# Patient Record
Sex: Female | Born: 1940 | Race: White | Hispanic: No | Smoking: Never smoker
Health system: Southern US, Community
[De-identification: ages and names within clinical notes are randomized; demographics above are authoritative.]

## PROBLEM LIST (undated history)

## (undated) DIAGNOSIS — I441 Atrioventricular block, second degree: Secondary | ICD-10-CM

## (undated) DIAGNOSIS — R002 Palpitations: Secondary | ICD-10-CM

## (undated) DIAGNOSIS — E7800 Pure hypercholesterolemia, unspecified: Secondary | ICD-10-CM

## (undated) DIAGNOSIS — K219 Gastro-esophageal reflux disease without esophagitis: Secondary | ICD-10-CM

## (undated) DIAGNOSIS — E039 Hypothyroidism, unspecified: Secondary | ICD-10-CM

## (undated) DIAGNOSIS — E669 Obesity, unspecified: Secondary | ICD-10-CM

## (undated) DIAGNOSIS — I4891 Unspecified atrial fibrillation: Secondary | ICD-10-CM

## (undated) DIAGNOSIS — D509 Iron deficiency anemia, unspecified: Secondary | ICD-10-CM

## (undated) DIAGNOSIS — D126 Benign neoplasm of colon, unspecified: Secondary | ICD-10-CM

## (undated) DIAGNOSIS — K3189 Other diseases of stomach and duodenum: Secondary | ICD-10-CM

## (undated) DIAGNOSIS — D472 Monoclonal gammopathy: Secondary | ICD-10-CM

## (undated) DIAGNOSIS — I10 Essential (primary) hypertension: Secondary | ICD-10-CM

## (undated) DIAGNOSIS — I340 Nonrheumatic mitral (valve) insufficiency: Secondary | ICD-10-CM

## (undated) HISTORY — DX: Obesity, unspecified: E66.9

## (undated) HISTORY — DX: Palpitations: R00.2

## (undated) HISTORY — DX: Pure hypercholesterolemia, unspecified: E78.00

## (undated) HISTORY — PX: OVARY SURGERY: SHX727

## (undated) HISTORY — DX: Hypothyroidism, unspecified: E03.9

## (undated) HISTORY — DX: Unspecified atrial fibrillation: I48.91

## (undated) HISTORY — DX: Essential (primary) hypertension: I10

## (undated) HISTORY — DX: Gastro-esophageal reflux disease without esophagitis: K21.9

## (undated) HISTORY — DX: Iron deficiency anemia, unspecified: D50.9

## (undated) HISTORY — DX: Monoclonal gammopathy: D47.2

## (undated) HISTORY — DX: Nonrheumatic mitral (valve) insufficiency: I34.0

## (undated) HISTORY — DX: Atrioventricular block, second degree: I44.1

## (undated) HISTORY — PX: ABDOMINAL HYSTERECTOMY: SHX81

## (undated) HISTORY — DX: Other diseases of stomach and duodenum: K31.89

## (undated) HISTORY — DX: Benign neoplasm of colon, unspecified: D12.6

---

## 1998-06-09 ENCOUNTER — Other Ambulatory Visit: Admission: RE | Admit: 1998-06-09 | Discharge: 1998-06-09 | Payer: Self-pay | Admitting: *Deleted

## 1999-07-23 ENCOUNTER — Other Ambulatory Visit: Admission: RE | Admit: 1999-07-23 | Discharge: 1999-07-23 | Payer: Self-pay | Admitting: *Deleted

## 2000-08-28 ENCOUNTER — Other Ambulatory Visit: Admission: RE | Admit: 2000-08-28 | Discharge: 2000-08-28 | Payer: Self-pay | Admitting: *Deleted

## 2001-02-07 ENCOUNTER — Encounter (INDEPENDENT_AMBULATORY_CARE_PROVIDER_SITE_OTHER): Payer: Self-pay | Admitting: *Deleted

## 2001-02-07 ENCOUNTER — Ambulatory Visit (HOSPITAL_COMMUNITY): Admission: RE | Admit: 2001-02-07 | Discharge: 2001-02-07 | Payer: Self-pay | Admitting: Gastroenterology

## 2010-04-16 ENCOUNTER — Ambulatory Visit: Payer: Self-pay | Admitting: Cardiology

## 2010-09-14 ENCOUNTER — Encounter: Payer: Self-pay | Admitting: Cardiology

## 2010-09-14 DIAGNOSIS — I421 Obstructive hypertrophic cardiomyopathy: Secondary | ICD-10-CM | POA: Insufficient documentation

## 2010-09-14 DIAGNOSIS — I340 Nonrheumatic mitral (valve) insufficiency: Secondary | ICD-10-CM | POA: Insufficient documentation

## 2010-09-14 DIAGNOSIS — E039 Hypothyroidism, unspecified: Secondary | ICD-10-CM | POA: Insufficient documentation

## 2010-09-14 DIAGNOSIS — D126 Benign neoplasm of colon, unspecified: Secondary | ICD-10-CM | POA: Insufficient documentation

## 2010-09-14 DIAGNOSIS — R002 Palpitations: Secondary | ICD-10-CM | POA: Insufficient documentation

## 2010-09-14 DIAGNOSIS — E669 Obesity, unspecified: Secondary | ICD-10-CM | POA: Insufficient documentation

## 2010-09-14 DIAGNOSIS — I10 Essential (primary) hypertension: Secondary | ICD-10-CM | POA: Insufficient documentation

## 2010-09-14 DIAGNOSIS — K219 Gastro-esophageal reflux disease without esophagitis: Secondary | ICD-10-CM | POA: Insufficient documentation

## 2010-09-14 DIAGNOSIS — E7800 Pure hypercholesterolemia, unspecified: Secondary | ICD-10-CM | POA: Insufficient documentation

## 2010-09-25 ENCOUNTER — Inpatient Hospital Stay (INDEPENDENT_AMBULATORY_CARE_PROVIDER_SITE_OTHER)
Admission: RE | Admit: 2010-09-25 | Discharge: 2010-09-25 | Disposition: A | Discharge status: Home / Self Care | Payer: Medicare Other | Source: Ambulatory Visit | Attending: Family Medicine | Admitting: Family Medicine

## 2010-09-25 DIAGNOSIS — L03119 Cellulitis of unspecified part of limb: Secondary | ICD-10-CM

## 2010-09-25 DIAGNOSIS — S61409A Unspecified open wound of unspecified hand, initial encounter: Secondary | ICD-10-CM

## 2010-09-26 ENCOUNTER — Inpatient Hospital Stay (INDEPENDENT_AMBULATORY_CARE_PROVIDER_SITE_OTHER)
Admission: RE | Admit: 2010-09-26 | Discharge: 2010-09-26 | Disposition: A | Discharge status: Home / Self Care | Payer: Medicare Other | Source: Ambulatory Visit | Attending: Family Medicine | Admitting: Family Medicine

## 2010-09-26 DIAGNOSIS — L0291 Cutaneous abscess, unspecified: Secondary | ICD-10-CM

## 2010-10-27 ENCOUNTER — Telehealth: Payer: Self-pay | Admitting: *Deleted

## 2010-10-27 NOTE — Telephone Encounter (Signed)
WANTS TO KNOW IF SHE CAN BE SWITCHED BACK TO LIPITOR NOW THAT IT IS GENERIC.

## 2010-11-01 ENCOUNTER — Ambulatory Visit: Payer: Self-pay | Admitting: Cardiology

## 2010-12-01 ENCOUNTER — Ambulatory Visit (INDEPENDENT_AMBULATORY_CARE_PROVIDER_SITE_OTHER): Payer: Medicare Other | Admitting: Cardiology

## 2010-12-01 ENCOUNTER — Encounter: Payer: Self-pay | Admitting: Cardiology

## 2010-12-01 DIAGNOSIS — R002 Palpitations: Secondary | ICD-10-CM

## 2010-12-01 DIAGNOSIS — I421 Obstructive hypertrophic cardiomyopathy: Secondary | ICD-10-CM

## 2010-12-01 DIAGNOSIS — E7800 Pure hypercholesterolemia, unspecified: Secondary | ICD-10-CM

## 2010-12-01 DIAGNOSIS — I10 Essential (primary) hypertension: Secondary | ICD-10-CM

## 2010-12-01 NOTE — Assessment & Plan Note (Signed)
She reports excellent results on Vytorin but finds that this is getting to be too expensive. She has done well on Lipitor in the past. I have no problem with switching her back to Lipitor but since this is being monitored by Dr. Waynard Edwards I have asked that she give him a call.

## 2010-12-01 NOTE — Patient Instructions (Signed)
Continue current medications.  Try and lose weight.  We will follow up for an office visit in 6 months.

## 2010-12-01 NOTE — Assessment & Plan Note (Signed)
Event monitor was normal. I have given her reassurance.

## 2010-12-01 NOTE — Assessment & Plan Note (Signed)
Well-controlled on current medications. We will continue with the same. I have encouraged weight loss.

## 2010-12-01 NOTE — Assessment & Plan Note (Signed)
She remains asymptomatic. Her murmur is stable. She is on therapy with verapamil and beta blockers. We will consider followup echocardiogram on her next visit.

## 2010-12-01 NOTE — Progress Notes (Signed)
Tracey Mcclure Date of Birth: [birth year]   History of Present Illness: Tracey Mcclure is seen today for followup. She is doing very well from a cardiac standpoint. She has had no shortness of breath or chest pain. She denies any dizziness. She still notes occasional flutter sensation in her lower sternum. We did have her wear an event monitor with multiple recordings during times of palpitations which showed normal sinus rhythm. She had no arrhythmia. Her exercise has been limited over the past several months because of a leg injury.  Current Outpatient Prescriptions on File Prior to Visit  Medication Sig Dispense Refill  . Acetaminophen (TYLENOL PO) Take by mouth as needed.        [name] Kitchen aspirin 81 MG tablet Take 81 mg by mouth daily.        . calcium-vitamin D (CALCIUM 500+D) 500-400 MG-UNIT per tablet Take 1 tablet by mouth daily.        . Cholecalciferol (VITAMIN D) 1000 UNITS capsule Take 1,000 Units by mouth daily.        . DiphenhydrAMINE HCl (BENADRYL PO) Take by mouth as needed.        . ezetimibe-simvastatin (VYTORIN) 10-20 MG per tablet Take 1 tablet by mouth at bedtime.        . fish oil-omega-3 fatty acids 1000 MG capsule Take 2 g by mouth daily.        . hydrochlorothiazide 25 MG tablet Take 25 mg by mouth daily.        [name] Kitchen levothyroxine (SYNTHROID, LEVOTHROID) 125 MCG tablet Take 125 mcg by mouth daily.        . metoprolol (TOPROL-XL) 25 MG 24 hr tablet Take 25 mg by mouth daily.        . Nutritional Supplements (MELATONIN PO) Take by mouth as needed.        . verapamil (CALAN) 120 MG tablet Take 120 mg by mouth daily.          No Known Allergies  Past Medical History  Diagnosis Date  . Hypertrophic obstructive cardiomyopathy   . Mitral insufficiency   . HTN (hypertension)     CONTROLLED  . Palpitations   . Hypercholesteremia   . Hypothyroidism   . GERD (gastroesophageal reflux disease)   . Obesity   . Colon adenoma     Past Surgical History  Procedure Date  .  Abdominal hysterectomy   . Ovary surgery     BENIGN TUMOR    History  Smoking status  . Never Smoker   Smokeless tobacco  . Not on file    History  Alcohol Use No    Family History  Problem Relation Age of Onset  . Heart attack Brother 55  . Prostate cancer Brother   . Breast cancer Sister     Review of Systems: The review of systems is positive for a pulled hamstring in her right leg.  She is receiving physical therapy for this. She has been monitoring her blood pressure readings at home and they have been in excellent control.All other systems were reviewed and are negative.  Physical Exam: BP 136/70  Pulse 60  Ht 5\' 5"  (1.651 m)  Wt 200 lb 12.8 oz (91.082 kg)  BMI 33.41 kg/m2 She is a pleasant overweight white female in no acute distress. HEENT exam is unremarkable. Sclerae are clear. Oropharynx is clear. Neck is supple no JVD, adenopathy, thyromegaly, or bruits. Lungs are clear. Cardiac exam reveals a regular rate and rhythm with  a grade 2/6 systolic murmur at the left sternal border. She has no S3. Abdomen is obese, soft, nontender. She has no edema. Pedal pulses are 2+. She is alert and oriented x3. Cranial nerves II through XII are intact. LABORATORY DATA:   Assessment / Plan:

## 2010-12-24 NOTE — Procedures (Signed)
[place]. [facility]  Patient:    Tracey Mcclure, Tracey Mcclure                         MRN: [id number] Adm. Date:  02/07/01 Attending:  Petra Kuba, M.D. CC:         Rodrigo Ran, M.D.   Procedure Report  PROCEDURE PERFORMED:  Colonoscopy with polypectomy.  ENDOSCOPIST:  Petra Kuba, M.D.  INDICATIONS FOR PROCEDURE:  Screening.  Consent was signed after risks, benefits, methods, and options were thoroughly discussed in the office.  MEDICATIONS USED:  Demerol 40 mg, Versed 4 mg.  DESCRIPTION OF PROCEDURE:  Rectal inspection was pertinent for external hemorrhoids.  Digital exam was negative.  Video endoscope was inserted, fairly easily advanced around the colon to the cecum.  This did require rolling her on her back and some abdominal pressure.  The cecum was identified by the appendiceal orifice and the ileocecal valve.  No obvious abnormality seen on insertion.  The cecum and the ascending colon did have some stool adherent to the wall which required lots of washing and suctioning.  Some could not be washed off but no obvious abnormality was seen.  The rest of the prep was adequate.  The scope was further withdrawn.  The cecum, ascending and transverse were normal as was the majority of the descending. There was a rare left-sided diverticula.  As we slowly withdrew back to the rectum in the proximal rectum a 3 mm polyp was seen, snared, electrocautery applied.  The polyp was suctioned through the scope and collected in the trap.  The scope was then retroflexed, pertinent for some internal hemorrhoids.  Scope was straightened, air was withdrawn and the scope removed.  The patient tolerated the procedure well.  There was no obvious immediate complication.  ENDOSCOPIC DIAGNOSIS: 1. Internal and external hemorrhoids. 2. Occasional left-sided diverticula. 3. Small rectal polyp, status post snare. 4. Otherwise  within normal limits to the cecum.  PLAN:  Will await  pathology but probably recheck colon screening in five years unless needed sooner p.r.n.  Happy to see back p.r.n.  Otherwise  return care to Dr. Waynard Edwards for customary health care maintenance to include yearly rectals and guaiacs and will put her on a one week customary postpolypectomy instructions. DD:  02/07/01 TD:  02/07/01 Job: [id number]

## 2011-02-16 ENCOUNTER — Encounter: Payer: Self-pay | Admitting: Cardiology

## 2011-03-10 ENCOUNTER — Telehealth: Payer: Self-pay | Admitting: Cardiology

## 2011-03-10 MED ORDER — METOPROLOL SUCCINATE ER 25 MG PO TB24: 25.0000 mg | ORAL_TABLET | Freq: Every day | ORAL | Status: DC

## 2011-03-10 NOTE — Telephone Encounter (Signed)
Pt wants [facility]ill [facility]l [facility] and she is having knee surgery and would like an clearance sent to ortho dr

## 2011-03-10 NOTE — Telephone Encounter (Signed)
Called requesting refill on Metoprolol. Also needs clearance for knee surgery sent to Dr. Fara Chute. Advised that their office would need to send [place] request for clearance. She will let their office know to send to [place]. Advised Dr. [place] would not be in the office until Mon 8/6. She said surgery scheduled for 9/4. Will let her know if cleared.

## 2011-03-11 ENCOUNTER — Telehealth: Payer: Self-pay | Admitting: *Deleted

## 2011-03-11 ENCOUNTER — Encounter: Payer: Self-pay | Admitting: Cardiology

## 2011-03-11 NOTE — Telephone Encounter (Signed)
Called to let her know that she was cleared by Dr. [place] for (r) knee arthoscopsy. Faxed to [facility] at [phone]

## 2011-04-07 ENCOUNTER — Encounter (HOSPITAL_BASED_OUTPATIENT_CLINIC_OR_DEPARTMENT_OTHER)
Admission: RE | Admit: 2011-04-07 | Discharge: 2011-04-07 | Disposition: A | Discharge status: Home / Self Care | Payer: Medicare Other | Source: Ambulatory Visit | Attending: Orthopaedic Surgery | Admitting: Orthopaedic Surgery

## 2011-04-07 ENCOUNTER — Telehealth: Payer: Self-pay | Admitting: Cardiology

## 2011-04-07 LAB — BASIC METABOLIC PANEL
BUN: 14 mg/dL (ref 6–23)
Calcium: 10.4 mg/dL (ref 8.4–10.5)
Creatinine, Ser: 0.89 mg/dL (ref 0.50–1.10)
GFR calc Af Amer: >60 mL/min (ref >60)
GFR calc non Af Amer: >60 mL/min (ref >60)
Glucose, Bld: 103 mg/dL — ABNORMAL HIGH (ref 70–99)
Potassium: 3.2 mEq/L — ABNORMAL LOW (ref 3.5–5.1)

## 2011-04-07 NOTE — Telephone Encounter (Signed)
Faxed over last OV, EKG, and ECHO.  No Stress done/available at [facility]. Other info faxed today.

## 2011-04-07 NOTE — Telephone Encounter (Signed)
Tracey Mcclure from [facility] requesting last ov, ekg, stress, echo

## 2011-04-12 ENCOUNTER — Ambulatory Visit (HOSPITAL_BASED_OUTPATIENT_CLINIC_OR_DEPARTMENT_OTHER)
Admission: RE | Admit: 2011-04-12 | Discharge: 2011-04-12 | Disposition: A | Discharge status: Home / Self Care | Payer: Medicare Other | Source: Ambulatory Visit | Attending: Orthopaedic Surgery | Admitting: Orthopaedic Surgery

## 2011-04-12 DIAGNOSIS — M23329 Other meniscus derangements, posterior horn of medial meniscus, unspecified knee: Secondary | ICD-10-CM | POA: Insufficient documentation

## 2011-04-12 DIAGNOSIS — M2240 Chondromalacia patellae, unspecified knee: Secondary | ICD-10-CM | POA: Insufficient documentation

## 2011-04-12 DIAGNOSIS — M23359 Other meniscus derangements, posterior horn of lateral meniscus, unspecified knee: Secondary | ICD-10-CM | POA: Insufficient documentation

## 2011-04-12 DIAGNOSIS — Z01812 Encounter for preprocedural laboratory examination: Secondary | ICD-10-CM | POA: Insufficient documentation

## 2011-04-12 DIAGNOSIS — I428 Other cardiomyopathies: Secondary | ICD-10-CM | POA: Insufficient documentation

## 2011-04-12 DIAGNOSIS — I10 Essential (primary) hypertension: Secondary | ICD-10-CM | POA: Insufficient documentation

## 2011-04-12 DIAGNOSIS — Z01810 Encounter for preprocedural cardiovascular examination: Secondary | ICD-10-CM | POA: Insufficient documentation

## 2011-04-12 DIAGNOSIS — K219 Gastro-esophageal reflux disease without esophagitis: Secondary | ICD-10-CM | POA: Insufficient documentation

## 2011-04-29 NOTE — Op Note (Signed)
  Tracey Mcclure, MASTERS                ACCOUNT NO.:  [id number]  MEDICAL RECORD NO.:  [id number]  LOCATION:                                 FACILITY:  PHYSICIAN:  Lubertha Basque. [name], M.D.DATE OF BIRTH:  [birth year]  DATE OF PROCEDURE:  04/12/2011 DATE OF DISCHARGE:                              OPERATIVE REPORT   PREOPERATIVE DIAGNOSIS:  Right knee torn lateral meniscus.  POSTOPERATIVE DIAGNOSES: 1. Right knee torn lateral and torn medial meniscus. 2. Right knee chondromalacia.  PROCEDURES: 1. Right knee partial medial and partial lateral meniscectomies. 2. Right knee abrasion chondroplasty, patellofemoral.  ANESTHESIA:  General.  ATTENDING SURGEON:  Lubertha Basque. Jerl Santos, MD  ASSISTANT:  Lindwood Qua, PA   INDICATIONS FOR PROCEDURE:  The patient is a 70 year old woman with many months of intense right knee pain especially on the posterolateral aspect.  She has pain which limits her ability to rest and walk and by MRI scan, has the significant meniscal injury.  She has failed injections and pills and offered an arthroscopy.  Informed operative consent was obtained after discussion of possible complications including reaction to anesthesia and infection.  SUMMARY OF FINDINGS AND PROCEDURE:  Under general anesthesia, an arthroscopy of the right knee was performed.  The suprapatellar pouch was benign while the patellofemoral joint exhibited focal breakdown at the apex of the patella.  Abrasion chondroplasty was done at one tiny area to bleeding bone.  In the medial compartment, she did have a middle and posterior horn degenerative meniscal tear addressed with about a 10% partial medial meniscectomy.  There were no degenerative changes in this compartment.  The ACL appeared intact.  In the lateral compartment, she had a more substantial fairly far posterior horn radial tear, which we addressed with about 20% partial lateral meniscectomy removing mostly the posterior  horn.  DESCRIPTION OF PROCEDURE:  The patient was taken to the operating suite where general anesthetic was applied without difficulty.  She was positioned supine and prepped and draped in normal sterile fashion. After administration of IV Kefzol, an arthroscopy of the right knee was performed through a total of two portals.  Findings were as noted above and procedure consisted of the partial lateral and medial meniscectomies done with baskets and shavers.  We also performed the abrasion chondroplasty patellofemoral to one tiny area of bleeding bone.  The knee was thoroughly irrigated followed by placement of Marcaine with epinephrine and morphine.  Adaptic was placed over the portals followed by dry gauze and a loose Ace wrap.  Estimated blood loss and intraoperative fluids obtained from anesthesia records.  DISPOSITION:  The patient was extubated in the operating room and taken to recovery room in stable condition.  She was to go home same-day and follow up in the office next week.  I will contact her by phone tonight.     Lubertha Basque Jerl Santos, M.D.     PGD/MEDQ  D:  04/12/2011  T:  04/12/2011  Job:  [id number]  Electronically Signed by Marcene Corning M.D. on 04/29/2011 12:16:05 PM

## 2011-08-24 DIAGNOSIS — R82998 Other abnormal findings in urine: Secondary | ICD-10-CM | POA: Diagnosis not present

## 2011-08-24 DIAGNOSIS — I10 Essential (primary) hypertension: Secondary | ICD-10-CM | POA: Diagnosis not present

## 2011-08-24 DIAGNOSIS — E785 Hyperlipidemia, unspecified: Secondary | ICD-10-CM | POA: Diagnosis not present

## 2011-08-24 DIAGNOSIS — E039 Hypothyroidism, unspecified: Secondary | ICD-10-CM | POA: Diagnosis not present

## 2011-08-24 DIAGNOSIS — R7301 Impaired fasting glucose: Secondary | ICD-10-CM | POA: Diagnosis not present

## 2011-08-31 DIAGNOSIS — I10 Essential (primary) hypertension: Secondary | ICD-10-CM | POA: Diagnosis not present

## 2011-08-31 DIAGNOSIS — E039 Hypothyroidism, unspecified: Secondary | ICD-10-CM | POA: Diagnosis not present

## 2011-08-31 DIAGNOSIS — R7301 Impaired fasting glucose: Secondary | ICD-10-CM | POA: Diagnosis not present

## 2011-08-31 DIAGNOSIS — Z0000 Encounter for general adult medical examination without abnormal findings: Secondary | ICD-10-CM | POA: Diagnosis not present

## 2011-08-31 DIAGNOSIS — Z124 Encounter for screening for malignant neoplasm of cervix: Secondary | ICD-10-CM | POA: Diagnosis not present

## 2011-09-12 DIAGNOSIS — H2510 Age-related nuclear cataract, unspecified eye: Secondary | ICD-10-CM | POA: Diagnosis not present

## 2011-09-12 DIAGNOSIS — H5210 Myopia, unspecified eye: Secondary | ICD-10-CM | POA: Diagnosis not present

## 2011-11-11 DIAGNOSIS — H259 Unspecified age-related cataract: Secondary | ICD-10-CM | POA: Diagnosis not present

## 2011-11-11 DIAGNOSIS — H5700 Unspecified anomaly of pupillary function: Secondary | ICD-10-CM | POA: Diagnosis not present

## 2011-11-11 DIAGNOSIS — H5210 Myopia, unspecified eye: Secondary | ICD-10-CM | POA: Diagnosis not present

## 2011-11-11 DIAGNOSIS — H524 Presbyopia: Secondary | ICD-10-CM | POA: Diagnosis not present

## 2011-11-24 ENCOUNTER — Ambulatory Visit (INDEPENDENT_AMBULATORY_CARE_PROVIDER_SITE_OTHER): Payer: Medicare Other | Admitting: Cardiology

## 2011-11-24 ENCOUNTER — Encounter: Payer: Self-pay | Admitting: Cardiology

## 2011-11-24 VITALS — BP 110/56 | HR 54 | Ht 64.0 in | Wt 174.0 lb

## 2011-11-24 DIAGNOSIS — I421 Obstructive hypertrophic cardiomyopathy: Secondary | ICD-10-CM

## 2011-11-24 DIAGNOSIS — I422 Other hypertrophic cardiomyopathy: Secondary | ICD-10-CM

## 2011-11-24 DIAGNOSIS — I059 Rheumatic mitral valve disease, unspecified: Secondary | ICD-10-CM | POA: Diagnosis not present

## 2011-11-24 DIAGNOSIS — I10 Essential (primary) hypertension: Secondary | ICD-10-CM | POA: Diagnosis not present

## 2011-11-24 DIAGNOSIS — I340 Nonrheumatic mitral (valve) insufficiency: Secondary | ICD-10-CM

## 2011-11-24 NOTE — Assessment & Plan Note (Signed)
Previous echocardiogram showed moderate mitral insufficiency. Followup with her echocardiogram.

## 2011-11-24 NOTE — Assessment & Plan Note (Signed)
She is doing very well from a cardiac standpoint. I am very pleased with her weight loss. We'll schedule her for a followup echocardiogram. Her last echocardiogram in 2010 showed asymmetric basal septal hypertrophy with systolic anterior motion of mitral valve and moderate outflow obstruction and 3 m/s. Continue therapy with beta blockers and verapamil. Followup again in one year.

## 2011-11-24 NOTE — Progress Notes (Signed)
Tracey Mcclure Date of Birth: 1940-09-25   History of Present Illness: Tracey Mcclure is seen today for yearly followup. She is doing very well from a cardiac standpoint. She only gets short of breath when she is exhausted. She has been working more in her yard. She has lost 26 pounds over the past year. She states she hasn't really tried but has reduced her sweet intake and only eats a snack at mid day. She denies any palpitations, dizziness, or chest pain.  Current Outpatient Prescriptions on File Prior to Visit  Medication Sig Dispense Refill  . Acetaminophen (TYLENOL PO) Take by mouth as needed.        [name] Kitchen aspirin 81 MG tablet Take 81 mg by mouth daily.        [name] Kitchen atorvastatin (LIPITOR) 40 MG tablet Take 40 mg by mouth daily.      . calcium-vitamin D (CALCIUM 500+D) 500-400 MG-UNIT per tablet Take 1 tablet by mouth daily.        . DiphenhydrAMINE HCl (BENADRYL PO) Take by mouth as needed.        . fish oil-omega-3 fatty acids 1000 MG capsule Take 2 g by mouth daily.        . hydrochlorothiazide 25 MG tablet Take 25 mg by mouth daily.        [name] Kitchen levothyroxine (SYNTHROID, LEVOTHROID) 125 MCG tablet Take 125 mcg by mouth daily.        . metoprolol succinate (TOPROL-XL) 25 MG 24 hr tablet Take 1 tablet (25 mg total) by mouth daily.  90 tablet  3  . ranitidine (ZANTAC) 150 MG capsule Take 150 mg by mouth every evening.      . verapamil (CALAN) 120 MG tablet Take 120 mg by mouth daily.          No Known Allergies  Past Medical History  Diagnosis Date  . Hypertrophic obstructive cardiomyopathy   . Mitral insufficiency   . HTN (hypertension)     CONTROLLED  . Palpitations   . Hypercholesteremia   . Hypothyroidism   . GERD (gastroesophageal reflux disease)   . Obesity   . Colon adenoma     Past Surgical History  Procedure Date  . Abdominal hysterectomy   . Ovary surgery     BENIGN TUMOR    History  Smoking status  . Never Smoker   Smokeless tobacco  . Not on file    History    Alcohol Use No    Family History  Problem Relation Age of Onset  . Heart attack Brother 55  . Prostate cancer Brother   . Breast cancer Sister     Review of Systems: As noted in history of present illness. She brings a list of blood pressure readings at home have been excellent. All other systems were reviewed and are negative.  Physical Exam: BP 110/56  Pulse 54  Ht 5\' 4"  (1.626 m)  Wt 174 lb (78.926 kg)  BMI 29.87 kg/m2 She is a pleasant overweight white female in no acute distress. HEENT exam is unremarkable. Sclerae are clear. Oropharynx is clear. Neck is supple no JVD, adenopathy, thyromegaly, or bruits. Lungs are clear. Cardiac exam reveals a regular rate and rhythm with a grade 2/6 systolic murmur at the left sternal border. She has no S3. Abdomen is obese, soft, nontender. She has no edema. Pedal pulses are 2+. She is alert and oriented x3. Cranial nerves II through XII are intact. LABORATORY DATA:   Assessment /  Plan:

## 2011-11-24 NOTE — Patient Instructions (Signed)
Continue your current medication  We will schedule you for an Echocardiogram.

## 2011-12-13 DIAGNOSIS — H2510 Age-related nuclear cataract, unspecified eye: Secondary | ICD-10-CM | POA: Diagnosis not present

## 2011-12-13 DIAGNOSIS — H5700 Unspecified anomaly of pupillary function: Secondary | ICD-10-CM | POA: Diagnosis not present

## 2011-12-13 DIAGNOSIS — H269 Unspecified cataract: Secondary | ICD-10-CM | POA: Diagnosis not present

## 2011-12-13 DIAGNOSIS — H25019 Cortical age-related cataract, unspecified eye: Secondary | ICD-10-CM | POA: Diagnosis not present

## 2011-12-13 DIAGNOSIS — H25049 Posterior subcapsular polar age-related cataract, unspecified eye: Secondary | ICD-10-CM | POA: Diagnosis not present

## 2011-12-19 ENCOUNTER — Other Ambulatory Visit: Payer: Self-pay

## 2011-12-19 ENCOUNTER — Ambulatory Visit (HOSPITAL_COMMUNITY): Payer: Medicare Other | Attending: Cardiology

## 2011-12-19 DIAGNOSIS — I422 Other hypertrophic cardiomyopathy: Secondary | ICD-10-CM | POA: Insufficient documentation

## 2011-12-19 DIAGNOSIS — I10 Essential (primary) hypertension: Secondary | ICD-10-CM | POA: Diagnosis not present

## 2011-12-19 DIAGNOSIS — I340 Nonrheumatic mitral (valve) insufficiency: Secondary | ICD-10-CM

## 2011-12-19 DIAGNOSIS — I059 Rheumatic mitral valve disease, unspecified: Secondary | ICD-10-CM | POA: Insufficient documentation

## 2011-12-19 DIAGNOSIS — R0609 Other forms of dyspnea: Secondary | ICD-10-CM | POA: Diagnosis not present

## 2011-12-19 DIAGNOSIS — R0989 Other specified symptoms and signs involving the circulatory and respiratory systems: Secondary | ICD-10-CM | POA: Insufficient documentation

## 2011-12-21 ENCOUNTER — Telehealth: Payer: Self-pay | Admitting: Cardiology

## 2011-12-21 NOTE — Telephone Encounter (Signed)
Pt rtn your call and she will be home until 11:30a

## 2011-12-21 NOTE — Telephone Encounter (Signed)
Patient called echo results given.Follow up in 6 months.

## 2012-01-10 DIAGNOSIS — Z780 Asymptomatic menopausal state: Secondary | ICD-10-CM | POA: Diagnosis not present

## 2012-02-01 DIAGNOSIS — Z1231 Encounter for screening mammogram for malignant neoplasm of breast: Secondary | ICD-10-CM | POA: Diagnosis not present

## 2012-02-01 DIAGNOSIS — Z803 Family history of malignant neoplasm of breast: Secondary | ICD-10-CM | POA: Diagnosis not present

## 2012-02-15 ENCOUNTER — Other Ambulatory Visit: Payer: Self-pay | Admitting: Cardiology

## 2012-06-13 DIAGNOSIS — Z23 Encounter for immunization: Secondary | ICD-10-CM | POA: Diagnosis not present

## 2012-09-19 DIAGNOSIS — R82998 Other abnormal findings in urine: Secondary | ICD-10-CM | POA: Diagnosis not present

## 2012-09-19 DIAGNOSIS — R7301 Impaired fasting glucose: Secondary | ICD-10-CM | POA: Diagnosis not present

## 2012-09-19 DIAGNOSIS — E039 Hypothyroidism, unspecified: Secondary | ICD-10-CM | POA: Diagnosis not present

## 2012-09-19 DIAGNOSIS — I10 Essential (primary) hypertension: Secondary | ICD-10-CM | POA: Diagnosis not present

## 2012-09-19 DIAGNOSIS — E785 Hyperlipidemia, unspecified: Secondary | ICD-10-CM | POA: Diagnosis not present

## 2012-09-26 DIAGNOSIS — Z124 Encounter for screening for malignant neoplasm of cervix: Secondary | ICD-10-CM | POA: Diagnosis not present

## 2012-09-26 DIAGNOSIS — Z0000 Encounter for general adult medical examination without abnormal findings: Secondary | ICD-10-CM | POA: Diagnosis not present

## 2012-09-26 DIAGNOSIS — Z23 Encounter for immunization: Secondary | ICD-10-CM | POA: Diagnosis not present

## 2012-09-26 DIAGNOSIS — I517 Cardiomegaly: Secondary | ICD-10-CM | POA: Diagnosis not present

## 2012-09-26 DIAGNOSIS — Z1331 Encounter for screening for depression: Secondary | ICD-10-CM | POA: Diagnosis not present

## 2012-09-26 DIAGNOSIS — Z79899 Other long term (current) drug therapy: Secondary | ICD-10-CM | POA: Diagnosis not present

## 2012-09-28 DIAGNOSIS — Z1212 Encounter for screening for malignant neoplasm of rectum: Secondary | ICD-10-CM | POA: Diagnosis not present

## 2012-11-20 ENCOUNTER — Encounter: Payer: Self-pay | Admitting: Cardiology

## 2012-11-22 DIAGNOSIS — E039 Hypothyroidism, unspecified: Secondary | ICD-10-CM | POA: Diagnosis not present

## 2012-12-03 ENCOUNTER — Ambulatory Visit (INDEPENDENT_AMBULATORY_CARE_PROVIDER_SITE_OTHER): Payer: Medicare Other | Admitting: Cardiology

## 2012-12-03 ENCOUNTER — Encounter: Payer: Self-pay | Admitting: Cardiology

## 2012-12-03 VITALS — BP 128/68 | HR 45 | Ht 64.0 in | Wt 178.8 lb

## 2012-12-03 DIAGNOSIS — R002 Palpitations: Secondary | ICD-10-CM

## 2012-12-03 DIAGNOSIS — I10 Essential (primary) hypertension: Secondary | ICD-10-CM

## 2012-12-03 DIAGNOSIS — I421 Obstructive hypertrophic cardiomyopathy: Secondary | ICD-10-CM | POA: Diagnosis not present

## 2012-12-03 DIAGNOSIS — I340 Nonrheumatic mitral (valve) insufficiency: Secondary | ICD-10-CM

## 2012-12-03 DIAGNOSIS — I059 Rheumatic mitral valve disease, unspecified: Secondary | ICD-10-CM

## 2012-12-03 DIAGNOSIS — I441 Atrioventricular block, second degree: Secondary | ICD-10-CM

## 2012-12-03 HISTORY — DX: Atrioventricular block, second degree: I44.1

## 2012-12-03 NOTE — Patient Instructions (Addendum)
Stop taking metoprolol  Continue your other medication  Let me know if your heart rate stays in the 40s  I will see you in 6 months.  If your triglycerides are greater than 200 then I would stay on fish oil

## 2012-12-04 NOTE — Progress Notes (Signed)
Viviann Spare Date of Birth: 12-25-1940   History of Present Illness: [name] is seen today for yearly followup. She is doing very well from a cardiac standpoint. She denies any significant chest pain or shortness of breath. She has stable symptoms of palpitations described as a skipping in her heartbeat. She complains that her balance is poor. Her primary care reduced her hydrochlorothiazide because of symptoms of dizziness when she bends over. She has had no syncope.  Current Outpatient Prescriptions on File Prior to Visit  Medication Sig Dispense Refill  . Acetaminophen (TYLENOL PO) Take by mouth as needed.        [name] Kitchen aspirin 81 MG tablet Take 81 mg by mouth daily.        [name] Kitchen atorvastatin (LIPITOR) 40 MG tablet Take 40 mg by mouth daily.      . DiphenhydrAMINE HCl (BENADRYL PO) Take by mouth as needed.        . hydrochlorothiazide 25 MG tablet Take 12.5 mg by mouth daily.       [name] Kitchen levothyroxine (SYNTHROID, LEVOTHROID) 125 MCG tablet Take 100 mcg by mouth daily.       . metoprolol succinate (TOPROL-XL) 25 MG 24 hr tablet TAKE 1 TABLET (25MG ) BY MOUTH DAILY  90 tablet  2  . ranitidine (ZANTAC) 150 MG capsule Take 150 mg by mouth every evening.      . verapamil (CALAN) 120 MG tablet Take 120 mg by mouth daily.         No current facility-administered medications on file prior to visit.    No Known Allergies  Past Medical History  Diagnosis Date  . Hypertrophic obstructive cardiomyopathy   . Mitral insufficiency   . HTN (hypertension)     CONTROLLED  . Palpitations   . Hypercholesteremia   . Hypothyroidism   . GERD (gastroesophageal reflux disease)   . Obesity   . Colon adenoma   . 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    Past Surgical History  Procedure Laterality Date  . Abdominal hysterectomy    . Ovary surgery      BENIGN TUMOR    History  Smoking status  . Never Smoker   Smokeless tobacco  . Not on file    History  Alcohol Use No     Family History  Problem Relation Age of Onset  . Heart attack Brother 55  . Prostate cancer Brother   . Breast cancer Sister     Review of Systems: As noted in history of present illness. She brings extensive readings of her blood pressure which have been normal. Pulse rate has been slow at times into the 40s. All other systems were reviewed and are negative.  Physical Exam: BP 128/68  Pulse 45  Ht 5\' 4"  (1.626 m)  Wt 178 lb 12.8 oz (81.103 kg)  BMI 30.68 kg/m2  SpO2 98% She is a pleasant overweight white female in no acute distress. HEENT exam is unremarkable. Sclerae are clear. Oropharynx is clear. Neck is supple no JVD, adenopathy, thyromegaly, or bruits. Lungs are clear. Cardiac exam reveals a regular rate and rhythm with a grade 2/6 systolic murmur at the left sternal border. She has no S3. Abdomen is obese, soft, nontender. She has no edema. Pedal pulses are 2+. She is alert and oriented x3. Cranial nerves II through XII are intact.  LABORATORY DATA: ECG today demonstrates sinus bradycardia with a heart rate of 45 beats per minute. She has Mobitz type  I second-degree AV block and nonspecific ST-T wave changes.  Assessment / Plan: 1. Sinus bradycardia with second-degree Mobitz type I AV block. I recommended discontinuation of her metoprolol. We will continue verapamil for now and she will monitor her heart rate. If it remains in the 40s we may need to consider alternative antihypertensive therapy. I think stopping the metoprolol will take care of it.  2. Hypertrophic cardiomyopathy. Echocardiogram in May of 2013 showed mild LVH with focal basal hypertrophy of the septum. Ejection fraction was normal. There was dynamic left ventricular outflow tract obstruction with a mean gradient of 36 mmHg. There was moderate mitral insufficiency with systolic anterior motion. We will continue with verapamil.  3. Mitral insufficiency related to her hypertrophic cardiomyopathy.  Asymptomatic.  4. Hypertension, controlled.

## 2013-01-21 DIAGNOSIS — H259 Unspecified age-related cataract: Secondary | ICD-10-CM | POA: Diagnosis not present

## 2013-01-21 DIAGNOSIS — H5700 Unspecified anomaly of pupillary function: Secondary | ICD-10-CM | POA: Diagnosis not present

## 2013-01-21 DIAGNOSIS — H0019 Chalazion unspecified eye, unspecified eyelid: Secondary | ICD-10-CM | POA: Diagnosis not present

## 2013-01-21 DIAGNOSIS — Z961 Presence of intraocular lens: Secondary | ICD-10-CM | POA: Diagnosis not present

## 2013-01-22 DIAGNOSIS — E039 Hypothyroidism, unspecified: Secondary | ICD-10-CM | POA: Diagnosis not present

## 2013-01-29 ENCOUNTER — Other Ambulatory Visit: Payer: Self-pay | Admitting: Gastroenterology

## 2013-01-29 DIAGNOSIS — D126 Benign neoplasm of colon, unspecified: Secondary | ICD-10-CM | POA: Diagnosis not present

## 2013-01-29 DIAGNOSIS — Z09 Encounter for follow-up examination after completed treatment for conditions other than malignant neoplasm: Secondary | ICD-10-CM | POA: Diagnosis not present

## 2013-01-29 DIAGNOSIS — Z86010 Personal history of colonic polyps: Secondary | ICD-10-CM | POA: Diagnosis not present

## 2013-02-01 DIAGNOSIS — Z1231 Encounter for screening mammogram for malignant neoplasm of breast: Secondary | ICD-10-CM | POA: Diagnosis not present

## 2013-02-01 DIAGNOSIS — Z803 Family history of malignant neoplasm of breast: Secondary | ICD-10-CM | POA: Diagnosis not present

## 2013-05-17 DIAGNOSIS — Z23 Encounter for immunization: Secondary | ICD-10-CM | POA: Diagnosis not present

## 2013-06-03 ENCOUNTER — Encounter: Payer: Self-pay | Admitting: Cardiology

## 2013-06-03 ENCOUNTER — Ambulatory Visit (INDEPENDENT_AMBULATORY_CARE_PROVIDER_SITE_OTHER): Payer: Medicare Other | Admitting: Cardiology

## 2013-06-03 VITALS — BP 124/60 | HR 74 | Ht 64.0 in | Wt 180.4 lb

## 2013-06-03 DIAGNOSIS — I441 Atrioventricular block, second degree: Secondary | ICD-10-CM

## 2013-06-03 DIAGNOSIS — I059 Rheumatic mitral valve disease, unspecified: Secondary | ICD-10-CM | POA: Diagnosis not present

## 2013-06-03 DIAGNOSIS — I10 Essential (primary) hypertension: Secondary | ICD-10-CM

## 2013-06-03 DIAGNOSIS — I421 Obstructive hypertrophic cardiomyopathy: Secondary | ICD-10-CM | POA: Diagnosis not present

## 2013-06-03 DIAGNOSIS — I340 Nonrheumatic mitral (valve) insufficiency: Secondary | ICD-10-CM

## 2013-06-03 NOTE — Progress Notes (Signed)
Tracey Mcclure Date of Birth: May 13, 1941   History of Present Illness: Tracey Mcclure is seen today for  followup. She has a history of hypertrophic obstructive cardiomyopathy, hypertension, and Mobitz type I second-degree AV block. Since then her pulse rate has improved and typically runs from the mid 50s to the mid 60s. She also brings extensive blood pressure recordings which demonstrated good blood pressure control. Her HCTZ dose was decreased by half because of symptoms of lightheadedness. Currently she denies any significant lightheadedness, dizziness, chest pain, or dyspnea. She does report that her balance isn't very good.  Current Outpatient Prescriptions on File Prior to Visit  Medication Sig Dispense Refill  . Acetaminophen (TYLENOL PO) Take by mouth as needed.        [name] Kitchen aspirin 81 MG tablet Take 81 mg by mouth daily.        [name] Kitchen atorvastatin (LIPITOR) 40 MG tablet Take 40 mg by mouth daily.      . DiphenhydrAMINE HCl (BENADRYL PO) Take by mouth as needed.        . hydrochlorothiazide 25 MG tablet Take 12.5 mg by mouth daily.       [name] Kitchen levothyroxine (SYNTHROID, LEVOTHROID) 125 MCG tablet Take 100 mcg by mouth daily.       . ranitidine (ZANTAC) 150 MG capsule Take 150 mg by mouth every evening.      . verapamil (CALAN) 120 MG tablet Take 120 mg by mouth daily.         No current facility-administered medications on file prior to visit.    No Known Allergies  Past Medical History  Diagnosis Date  . Hypertrophic obstructive cardiomyopathy   . Mitral insufficiency   . HTN (hypertension)     CONTROLLED  . Palpitations   . Hypercholesteremia   . Hypothyroidism   . GERD (gastroesophageal reflux disease)   . Obesity   . Colon adenoma   . 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    Past Surgical History  Procedure Laterality Date  . Abdominal hysterectomy    . Ovary surgery      BENIGN TUMOR    History  Smoking status  . Never Smoker   Smokeless  tobacco  . Not on file    History  Alcohol Use No    Family History  Problem Relation Age of Onset  . Heart attack Brother 55  . Prostate cancer Brother   . Breast cancer Sister     Review of Systems: As noted in history of present illness.  All other systems were reviewed and are negative.  Physical Exam: BP 124/60  Pulse 74  Ht 5\' 4"  (1.626 m)  Wt 180 lb 6.4 oz (81.829 kg)  BMI 30.95 kg/m2 She is a pleasant overweight white female in no acute distress. HEENT exam is unremarkable. Sclerae are clear. Oropharynx is clear. Neck is supple no JVD, adenopathy, thyromegaly, or bruits. Lungs are clear. Cardiac exam reveals a regular rate and rhythm with a grade 2/6 systolic murmur at the left sternal border. She has no S3. Abdomen is obese, soft, nontender. She has no edema. Pedal pulses are 2+. She is alert and oriented x3. Cranial nerves II through XII are intact.  LABORATORY DATA:   Assessment / Plan: 1. Sinus bradycardia with second-degree Mobitz type I AV block. Improved with stopping her beta blocker therapy.  2. Hypertrophic cardiomyopathy. Echocardiogram in May of 2013 showed mild LVH with focal basal hypertrophy of the septum. Ejection fraction was  normal. There was dynamic left ventricular outflow tract obstruction with a mean gradient of 36 mmHg. There was moderate mitral insufficiency with systolic anterior motion. We will continue with verapamil.  3. Mitral insufficiency related to her hypertrophic cardiomyopathy. Asymptomatic.  4. Hypertension, controlled.

## 2013-06-03 NOTE — Patient Instructions (Signed)
Continue your current therapy  I will see you in 6 months.   

## 2013-09-30 DIAGNOSIS — R82998 Other abnormal findings in urine: Secondary | ICD-10-CM | POA: Diagnosis not present

## 2013-09-30 DIAGNOSIS — E039 Hypothyroidism, unspecified: Secondary | ICD-10-CM | POA: Diagnosis not present

## 2013-09-30 DIAGNOSIS — I10 Essential (primary) hypertension: Secondary | ICD-10-CM | POA: Diagnosis not present

## 2013-09-30 DIAGNOSIS — R809 Proteinuria, unspecified: Secondary | ICD-10-CM | POA: Diagnosis not present

## 2013-09-30 DIAGNOSIS — E785 Hyperlipidemia, unspecified: Secondary | ICD-10-CM | POA: Diagnosis not present

## 2013-09-30 DIAGNOSIS — R7301 Impaired fasting glucose: Secondary | ICD-10-CM | POA: Diagnosis not present

## 2013-10-01 DIAGNOSIS — D509 Iron deficiency anemia, unspecified: Secondary | ICD-10-CM | POA: Diagnosis not present

## 2013-10-01 DIAGNOSIS — Z79899 Other long term (current) drug therapy: Secondary | ICD-10-CM | POA: Diagnosis not present

## 2013-10-07 ENCOUNTER — Other Ambulatory Visit: Payer: Self-pay | Admitting: Internal Medicine

## 2013-10-07 DIAGNOSIS — D472 Monoclonal gammopathy: Secondary | ICD-10-CM | POA: Diagnosis not present

## 2013-10-07 DIAGNOSIS — Z124 Encounter for screening for malignant neoplasm of cervix: Secondary | ICD-10-CM | POA: Diagnosis not present

## 2013-10-07 DIAGNOSIS — E039 Hypothyroidism, unspecified: Secondary | ICD-10-CM | POA: Diagnosis not present

## 2013-10-07 DIAGNOSIS — R1906 Epigastric swelling, mass or lump: Secondary | ICD-10-CM | POA: Diagnosis not present

## 2013-10-07 DIAGNOSIS — Z1331 Encounter for screening for depression: Secondary | ICD-10-CM | POA: Diagnosis not present

## 2013-10-07 DIAGNOSIS — D509 Iron deficiency anemia, unspecified: Secondary | ICD-10-CM | POA: Diagnosis not present

## 2013-10-07 DIAGNOSIS — E785 Hyperlipidemia, unspecified: Secondary | ICD-10-CM | POA: Diagnosis not present

## 2013-10-07 DIAGNOSIS — I10 Essential (primary) hypertension: Secondary | ICD-10-CM | POA: Diagnosis not present

## 2013-10-07 DIAGNOSIS — Z0000 Encounter for general adult medical examination without abnormal findings: Secondary | ICD-10-CM | POA: Diagnosis not present

## 2013-10-08 ENCOUNTER — Telehealth: Payer: Self-pay | Admitting: Hematology and Oncology

## 2013-10-08 NOTE — Telephone Encounter (Signed)
C/D 10/08/13 for appt. 10/14/13 °

## 2013-10-08 NOTE — Telephone Encounter (Signed)
S/W PATIENT AND GAVE NEW PATIENT APPT FOR 03/09 @ 10:45 W/DR. [place].  REFERRING DR. [place] PACKET MAILED.

## 2013-10-10 DIAGNOSIS — K2100 Gastro-esophageal reflux disease with esophagitis, without bleeding: Secondary | ICD-10-CM | POA: Diagnosis not present

## 2013-10-10 DIAGNOSIS — K5900 Constipation, unspecified: Secondary | ICD-10-CM | POA: Diagnosis not present

## 2013-10-10 DIAGNOSIS — D500 Iron deficiency anemia secondary to blood loss (chronic): Secondary | ICD-10-CM | POA: Diagnosis not present

## 2013-10-10 DIAGNOSIS — R109 Unspecified abdominal pain: Secondary | ICD-10-CM | POA: Diagnosis not present

## 2013-10-11 ENCOUNTER — Ambulatory Visit
Admission: RE | Admit: 2013-10-11 | Discharge: 2013-10-11 | Disposition: A | Discharge status: Home / Self Care | Payer: Medicare Other | Source: Ambulatory Visit | Attending: Internal Medicine | Admitting: Internal Medicine

## 2013-10-11 DIAGNOSIS — R1906 Epigastric swelling, mass or lump: Secondary | ICD-10-CM

## 2013-10-11 DIAGNOSIS — K5730 Diverticulosis of large intestine without perforation or abscess without bleeding: Secondary | ICD-10-CM | POA: Diagnosis not present

## 2013-10-11 MED ORDER — IOHEXOL 300 MG/ML  SOLN
100.0000 mL | Freq: Once | INTRAMUSCULAR | Status: AC | PRN
  Administered 2013-10-11: 100 mL via INTRAVENOUS

## 2013-10-14 ENCOUNTER — Encounter: Payer: Self-pay | Admitting: Hematology and Oncology

## 2013-10-14 ENCOUNTER — Ambulatory Visit: Payer: Medicare Other

## 2013-10-14 ENCOUNTER — Ambulatory Visit (HOSPITAL_BASED_OUTPATIENT_CLINIC_OR_DEPARTMENT_OTHER): Payer: Medicare Other | Admitting: Hematology and Oncology

## 2013-10-14 ENCOUNTER — Telehealth: Payer: Self-pay | Admitting: *Deleted

## 2013-10-14 VITALS — BP 130/53 | HR 58 | Temp 98.3°F | Resp 18 | Ht 64.0 in | Wt 175.5 lb

## 2013-10-14 DIAGNOSIS — D509 Iron deficiency anemia, unspecified: Secondary | ICD-10-CM

## 2013-10-14 DIAGNOSIS — D472 Monoclonal gammopathy: Secondary | ICD-10-CM

## 2013-10-14 DIAGNOSIS — K319 Disease of stomach and duodenum, unspecified: Secondary | ICD-10-CM | POA: Diagnosis not present

## 2013-10-14 DIAGNOSIS — K3189 Other diseases of stomach and duodenum: Secondary | ICD-10-CM

## 2013-10-14 HISTORY — DX: Iron deficiency anemia, unspecified: D50.9

## 2013-10-14 HISTORY — DX: Other diseases of stomach and duodenum: K31.89

## 2013-10-14 HISTORY — DX: Monoclonal gammopathy: D47.2

## 2013-10-14 NOTE — Progress Notes (Signed)
[place] CONSULT NOTE  Patient Care Team: Jerlyn Ly, MD as PCP - General (Internal Medicine)  CHIEF COMPLAINTS/PURPOSE OF CONSULTATION:  New onset iron deficiency anemia and monoclonal gammopathy of unknown significance  HISTORY OF PRESENTING ILLNESS:  Tracey Mcclure 73 y.o. female is here because of abnormal blood work  She was found to have abnormal CBC from routine blood tests. On 10/07/2013, CBC showed hemoglobin of 8.8 with evidence of iron deficiency. Ferritin level was 7. And review her blood work from 2012, the patient is not anemic. 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 She had not noticed any recent bleeding such as epistaxis, hematuria or hematochezia The patient denies over the counter NSAID ingestion. She is not on antiplatelets agents apart from aspirin which was subsequently discontinued due to iron deficiency anemia. Her last colonoscopy was in 2014 which showed no evidence of abnormalities apart from some benign colon polyps. She had no prior history or diagnosis of cancer. Her age appropriate screening programs are up-to-date. She denies any pica and eats a variety of diet. She donated blood regularly up until recently. She has never received blood transfusion The patient was prescribed oral iron supplements and she takes it twice a day. From the MGUS stand point, she denies any new areas of bone pain.  MEDICAL HISTORY:  Past Medical History  Diagnosis Date  . Hypertrophic obstructive cardiomyopathy   . Mitral insufficiency   . HTN (hypertension)     CONTROLLED  . Palpitations   . Hypercholesteremia   . Hypothyroidism   . GERD (gastroesophageal reflux disease)   . Obesity   . Colon adenoma   . Mobitz type 1 second degree  atrioventricular block 12/03/2012    SURGICAL HISTORY: Past Surgical History  Procedure Laterality Date  . Abdominal hysterectomy    . Ovary surgery      BENIGN TUMOR    SOCIAL HISTORY: History   Social History  . Marital Status: Widowed    Spouse Name: N/A    Number of Children: 1  . Years of Education: N/A   Occupational History  . Art gallery manager     retired   Social History Main Topics  . Smoking status: Never Smoker   . Smokeless tobacco: Never Used  . Alcohol Use: No  . Drug Use: No  . Sexual Activity: Not on file   Other Topics Concern  . Not on file   Social History Narrative  . No narrative on file    FAMILY HISTORY: Family History  Problem Relation Age of Onset  . Heart attack Brother 59  . Prostate cancer Brother   . Breast cancer Sister 38    ALLERGIES:  has No Known Allergies.  MEDICATIONS:  Current Outpatient Prescriptions  Medication Sig Dispense Refill  . atorvastatin (LIPITOR) 40 MG tablet Take 40 mg by mouth daily.      . cholecalciferol (VITAMIN D) 1000 UNITS tablet Take 1,000 Units by mouth daily.      . DiphenhydrAMINE HCl (BENADRYL PO) Take by mouth as needed.        . ferrous sulfate 325 (65 FE) MG tablet Take 325 mg by mouth 2 (two) times daily.      . hydrochlorothiazide 25 MG tablet Take 12.5 mg by mouth every other day.       [name] Kitchen  levothyroxine (SYNTHROID, LEVOTHROID) 125 MCG tablet Take 125 mcg by mouth daily.       . ranitidine (ZANTAC) 150 MG capsule Take 150 mg by mouth every evening.      . verapamil (CALAN) 120 MG tablet Take 120 mg by mouth daily.        . vitamin B-12 (CYANOCOBALAMIN) 1000 MCG tablet Take 1,000 mcg by mouth daily.       No current facility-administered medications for this visit.    REVIEW OF SYSTEMS:   Constitutional: Denies fevers, chills or abnormal night sweats Eyes: Denies blurriness of vision, double vision or watery eyes Ears, nose, mouth, throat, and face: Denies mucositis or sore  throat Respiratory: Denies cough, dyspnea or wheezes Cardiovascular: Denies palpitation, chest discomfort or lower extremity swelling Gastrointestinal:  Denies nausea, heartburn or change in bowel habits Skin: Denies abnormal skin rashes Lymphatics: Denies new lymphadenopathy or easy bruising Neurological:Denies numbness, tingling or new weaknesses Behavioral/Psych: Mood is stable, no new changes  All other systems were reviewed with the patient and are negative.  PHYSICAL EXAMINATION: ECOG PERFORMANCE STATUS: 0 - Asymptomatic  Filed Vitals:   10/14/13 1103  BP: 130/53  Pulse: 58  Temp: 98.3 F (36.8 C)  Resp: 18   Filed Weights   10/14/13 1103  Weight: 175 lb 8 oz (79.606 kg)    GENERAL:alert, no distress and comfortable SKIN: skin color, texture, turgor are normal, no rashes or significant lesions EYES: normal, conjunctiva are pink and non-injected, sclera clear OROPHARYNX:no exudate, no erythema and lips, buccal mucosa, and tongue normal  NECK: supple, thyroid normal size, non-tender, without nodularity LYMPH:  no palpable lymphadenopathy in the cervical, axillary or inguinal LUNGS: clear to auscultation and percussion with normal breathing effort HEART: regular rate & rhythm with a soft systolic murmur on the left sternal border. There is no evidence of lower extremity edema. ABDOMEN:abdomen soft, non-tender and normal bowel sounds Musculoskeletal:no cyanosis of digits and no clubbing  PSYCH: alert & oriented x 3 with fluent speech NEURO: no focal motor/sensory deficits  LABORATORY DATA:  I have reviewed the data as listed   RADIOGRAPHIC STUDIES: I have personally reviewed the radiological images as listed and agreed with the findings in the report. Ct Abdomen Pelvis W Contrast  10/11/2013   CLINICAL DATA:  Abdominal discomfort.  Evaluate for epigastric mass  EXAM: CT ABDOMEN AND PELVIS WITH CONTRAST  TECHNIQUE: Multidetector CT imaging of the abdomen and pelvis was  performed using the standard protocol following bolus administration of intravenous contrast.  CONTRAST:  15mL OMNIPAQUE IOHEXOL 300 MG/ML  SOLN  COMPARISON:  None.  FINDINGS: The lung bases are unremarkable.  The liver, spleen, adrenals, pancreas are unremarkable. A calcified gallstone is appreciated within the gallbladder.  The stomach is distended field with dilute contrast and fluid. There are areas within the nondependent portion of the stomach which may represent food debris.  An area concerning 40 filling defect is appreciated within the fundus of the stomach. This finding has mural based appearance with lobulated components and areas concerning for wall thickening. These findings are appreciated on images 16 through 28 series 3.  Mild dilation of the pelvicaliceal system on the right is appreciated. There is moderate dilation of the renal pelvis. The ureter distal to the pelvis demonstrates a normal caliber. There is no evidence of nephrolithiasis. There is no evidence of solid or cystic masses within the right kidney. The left kidney is unremarkable.  There is no evidence of bowel obstruction, enteritis, colitis,  diverticulitis, nor appendicitis. The appendix is identified and is unremarkable.  There is no evidence of abdominal aortic aneurysm. The celiac, SMA, IMA, portal vein, SMV are opacified. Atherosclerotic calcifications are appreciated within the aorta.  There is no evidence of abdominal or pelvic free fluid, loculated fluid collections further findings to suggest a mass, nor adenopathy. Diverticulosis is appreciated within the sigmoid colon.  A small fat containing umbilical hernia is appreciated. There is no evidence of an inguinal hernia.  Bilateral pars defects are appreciated at the L5 level. There is grade 1 anterolisthesis at this level. There is disc space narrowing, peripheral hypertrophic spurring, and vacuum disc at the L5-S1 level.  IMPRESSION: 1. CT findings concerning for a mural  based filling defect within the fundal region of the stomach. Further evaluation with direct visualization, GI consultation if not already obtained is recommended, particularly considering the patient's presentation. Differential considerations include a soft tissue mass, eg. Adenocarcinoma, lymphoma, malignant gastrointestinal stromal tumor, carcinoid, or possibly metastatic disease. More benign etiology such is food debris adherent or adjacent to the stomach wall cannot be excluded. These results will be called to the ordering clinician or representative by the Radiologist Assistant, and communication documented in the PACS Dashboard. 2. 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 Clinical correlation recommended and surgical consultation if clinically warranted. 3. Findings within the right kidney raising concern of a possible partial ureteral pelvic junction obstruction. 4. Diverticulosis in the sigmoid colon.   Electronically Signed   By: Margaree Mackintosh M.D.   On: 10/11/2013 16:28    ASSESSMENT:  Iron deficiency anemia, likely due to GI bleed until proven otherwise  PLAN #1 iron deficiency Anemia #2 abnormal CT scan with filling defect in the stomach She has appointment for EGD and biopsy in 2 days time.  The patient is placed on an antiacid medicine. In the meantime she is tolerating oral iron supplement well. The patient is also taking oral B12 supplementation. I plan to see her back in a month's time for further review. #3 IgA MGUS She will require further workup on this. The patient is overwhelmed with the negative results from the CT scan. I would discuss with her further about workup pending on the evaluation with EGD. I will see her back in a month for further followup.  Overall, the patient is comfortable not to pursue additional workup right now until she undergoes her EGD as planned.   All questions were answered. The patient  knows to call the clinic with any problems, questions or concerns. I spent 40 minutes counseling the patient face to face. The total time spent in the appointment was 55 minutes and more than 50% was on counseling. [facility], Georgi Tuel, MD 10/14/2013

## 2013-10-14 NOTE — Telephone Encounter (Signed)
appts made and pt....td

## 2013-10-14 NOTE — Progress Notes (Signed)
Checked in new pt with no financial concerns. °

## 2013-10-16 DIAGNOSIS — D649 Anemia, unspecified: Secondary | ICD-10-CM | POA: Diagnosis not present

## 2013-10-16 DIAGNOSIS — R933 Abnormal findings on diagnostic imaging of other parts of digestive tract: Secondary | ICD-10-CM | POA: Diagnosis not present

## 2013-11-04 DIAGNOSIS — D509 Iron deficiency anemia, unspecified: Secondary | ICD-10-CM | POA: Diagnosis not present

## 2013-11-04 DIAGNOSIS — Z79899 Other long term (current) drug therapy: Secondary | ICD-10-CM | POA: Diagnosis not present

## 2013-11-14 ENCOUNTER — Encounter: Payer: Self-pay | Admitting: Hematology and Oncology

## 2013-11-14 ENCOUNTER — Ambulatory Visit: Payer: Medicare Other

## 2013-11-14 ENCOUNTER — Ambulatory Visit (HOSPITAL_BASED_OUTPATIENT_CLINIC_OR_DEPARTMENT_OTHER): Payer: Medicare Other | Admitting: Hematology and Oncology

## 2013-11-14 ENCOUNTER — Telehealth: Payer: Self-pay | Admitting: Hematology and Oncology

## 2013-11-14 VITALS — BP 142/51 | HR 62 | Temp 97.3°F | Resp 18 | Ht 64.0 in | Wt 175.2 lb

## 2013-11-14 DIAGNOSIS — D472 Monoclonal gammopathy: Secondary | ICD-10-CM | POA: Diagnosis not present

## 2013-11-14 NOTE — Progress Notes (Signed)
[place] OFFICE PROGRESS NOTE  Patient Care Team: Jerlyn Ly, MD as PCP - General (Internal Medicine) Heath Lark, MD as Consulting Physician (Hematology and Oncology)  DIAGNOSIS: History of iron deficiency anemia, resolved and IgA MGUS  SUMMARY OF ONCOLOGIC HISTORY: She was found to have abnormal CBC from routine blood tests. On 10/07/2013, CBC showed hemoglobin of 8.8 with evidence of iron deficiency. Ferritin level was 7. And review her blood work from 2012, the patient is not anemic. 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 Her last colonoscopy was in 2014 which showed no evidence of abnormalities apart from some benign colon polyps. CT scan of the abdomen show abnormal mass in the stomach. EGD did not reveal any abnormalities. Anemia resolved with iron supplements. INTERVAL HISTORY: Tracey Mcclure 73 y.o. female returns for further followup. She is to take an iron supplement daily. She denies any new bone pain.  I have reviewed the past medical history, past surgical history, social history and family history with the patient and they are unchanged from previous note.  ALLERGIES:  has No Known Allergies.  MEDICATIONS:  Current Outpatient Prescriptions  Medication Sig Dispense Refill  . atorvastatin (LIPITOR) 40 MG tablet Take 40 mg by mouth daily.      . DiphenhydrAMINE HCl (BENADRYL PO) Take by mouth as needed.        . ferrous sulfate 325 (65 FE) MG tablet Take 325 mg by mouth daily.       . hydrochlorothiazide 25 MG tablet Take 12.5 mg by mouth every other day.       . levothyroxine (SYNTHROID, LEVOTHROID) 125 MCG tablet Take 125 mcg by mouth daily.       . ranitidine (ZANTAC) 150 MG capsule Take 150 mg by mouth every evening.      . verapamil (CALAN) 120 MG tablet Take 120 mg by mouth daily.        . vitamin B-12 (CYANOCOBALAMIN) 1000 MCG tablet Take 1,000 mcg by  mouth daily.       No current facility-administered medications for this visit.    REVIEW OF SYSTEMS:   Constitutional: Denies fevers, chills or abnormal weight loss Behavioral/Psych: Mood is stable, no new changes  All other systems were reviewed with the patient and are negative.  PHYSICAL EXAMINATION: ECOG PERFORMANCE STATUS: 0 - Asymptomatic  Filed Vitals:   11/14/13 0917  BP: 142/51  Pulse: 62  Temp: 97.3 F (36.3 C)  Resp: 18   Filed Weights   11/14/13 0917  Weight: 175 lb 3.2 oz (79.47 kg)    GENERAL:alert, no distress and comfortable SKIN: skin color, texture, turgor are normal, no rashes or significant lesions EYES: normal, Conjunctiva are pink and non-injected, sclera clear OROPHARYNX:no exudate, no erythema and lips, buccal mucosa, and tongue normal  NECK: supple, thyroid normal size, non-tender, without nodularity LYMPH:  no palpable lymphadenopathy in the cervical, axillary or inguinal LUNGS: clear to auscultation and percussion with normal breathing effort HEART: regular rate & rhythm and no murmurs and no lower extremity edema ABDOMEN:abdomen soft, non-tender and normal bowel sounds Musculoskeletal:no cyanosis of digits and no clubbing  NEURO: alert & oriented x 3 with fluent speech, no focal motor/sensory deficits  LABORATORY DATA:  I have reviewed the data as listed    Component Value Date/Time   NA 138 04/07/2011 1200   K 3.2* 04/07/2011 1200   CL 100 04/07/2011 1200  CO2 28 04/07/2011 1200   GLUCOSE 103* 04/07/2011 1200   BUN 14 04/07/2011 1200   CREATININE 0.89 04/07/2011 1200   CALCIUM 10.4 04/07/2011 1200   GFRNONAA >60 04/07/2011 1200   GFRAA >60 04/07/2011 1200    No results found for this basename: SPEP,  UPEP,   kappa and lambda light chains    Lab Results  Component Value Date   HGB 12.7 04/12/2011      Chemistry      Component Value Date/Time   NA 138 04/07/2011 1200   K 3.2* 04/07/2011 1200   CL 100 04/07/2011 1200   CO2 28 04/07/2011  1200   BUN 14 04/07/2011 1200   CREATININE 0.89 04/07/2011 1200      Component Value Date/Time   CALCIUM 10.4 04/07/2011 1200     ASSESSMENT & PLAN:  #1 history of iron deficiency anemia, resolved Cause is unknown. She had EGD and colonoscopy which was negative. She will continue oral iron supplements. #2 IgA MGUS The patient does not appear to have evidence of end organ damage. I recommend repeat blood work, 24-hour urine collection and skeletal survey to be done in 3 months.  Orders Placed This Encounter  Procedures  . DG Bone Survey Met    Standing Status: Future     Number of Occurrences:      Standing Expiration Date: 01/14/2015    Order Specific Question:  Reason for Exam (SYMPTOM  OR DIAGNOSIS REQUIRED)    Answer:  staging myeloma    Order Specific Question:  Preferred imaging location?    Answer:  [facility]  . CBC with Differential    Standing Status: Future     Number of Occurrences: 1     Standing Expiration Date: 11/14/2014  . Comprehensive metabolic panel    Standing Status: Future     Number of Occurrences: 1     Standing Expiration Date: 11/14/2014  . SPEP & IFE with QIG    Standing Status: Future     Number of Occurrences: 1     Standing Expiration Date: 11/14/2014  . Kappa/lambda light chains    Standing Status: Future     Number of Occurrences: 1     Standing Expiration Date: 11/14/2014  . Protein Electro, 24-Hour Urine    Standing Status: Future     Number of Occurrences: 1     Standing Expiration Date: 11/14/2014  . IFE, Urine (with Tot Prot)    Standing Status: Future     Number of Occurrences: 1     Standing Expiration Date: 11/14/2014   All questions were answered. The patient knows to call the clinic with any problems, questions or concerns. No barriers to learning was detected. I spent 25 minutes counseling the patient face to face. The total time spent in the appointment was 30 minutes and more than 50% was on counseling and review of test  results     Heath Lark, MD 11/14/2013 4:58 PM

## 2013-11-14 NOTE — Telephone Encounter (Signed)
gv adn printed appt sched and avs for pt for July....sent pt to lab to pick up jug

## 2013-11-22 DIAGNOSIS — D500 Iron deficiency anemia secondary to blood loss (chronic): Secondary | ICD-10-CM | POA: Diagnosis not present

## 2013-11-22 DIAGNOSIS — K921 Melena: Secondary | ICD-10-CM | POA: Diagnosis not present

## 2013-11-22 DIAGNOSIS — R109 Unspecified abdominal pain: Secondary | ICD-10-CM | POA: Diagnosis not present

## 2013-11-26 DIAGNOSIS — I10 Essential (primary) hypertension: Secondary | ICD-10-CM | POA: Diagnosis not present

## 2013-11-26 DIAGNOSIS — R1906 Epigastric swelling, mass or lump: Secondary | ICD-10-CM | POA: Diagnosis not present

## 2013-11-26 DIAGNOSIS — Z6828 Body mass index (BMI) 28.0-28.9, adult: Secondary | ICD-10-CM | POA: Diagnosis not present

## 2013-11-26 DIAGNOSIS — D472 Monoclonal gammopathy: Secondary | ICD-10-CM | POA: Diagnosis not present

## 2013-11-26 DIAGNOSIS — D509 Iron deficiency anemia, unspecified: Secondary | ICD-10-CM | POA: Diagnosis not present

## 2013-12-05 ENCOUNTER — Ambulatory Visit (INDEPENDENT_AMBULATORY_CARE_PROVIDER_SITE_OTHER): Payer: Medicare Other | Admitting: Cardiology

## 2013-12-05 ENCOUNTER — Encounter: Payer: Self-pay | Admitting: Cardiology

## 2013-12-05 VITALS — BP 134/62 | HR 51 | Ht 64.0 in | Wt 173.8 lb

## 2013-12-05 DIAGNOSIS — I340 Nonrheumatic mitral (valve) insufficiency: Secondary | ICD-10-CM

## 2013-12-05 DIAGNOSIS — I10 Essential (primary) hypertension: Secondary | ICD-10-CM | POA: Diagnosis not present

## 2013-12-05 DIAGNOSIS — I059 Rheumatic mitral valve disease, unspecified: Secondary | ICD-10-CM | POA: Diagnosis not present

## 2013-12-05 DIAGNOSIS — I4891 Unspecified atrial fibrillation: Secondary | ICD-10-CM | POA: Insufficient documentation

## 2013-12-05 DIAGNOSIS — D509 Iron deficiency anemia, unspecified: Secondary | ICD-10-CM

## 2013-12-05 DIAGNOSIS — I421 Obstructive hypertrophic cardiomyopathy: Secondary | ICD-10-CM | POA: Diagnosis not present

## 2013-12-05 HISTORY — DX: Unspecified atrial fibrillation: I48.91

## 2013-12-05 NOTE — Patient Instructions (Signed)
Stop taking verapamil  We will await the results of your GI evaluation  We will schedule you for an Echocardiogram  I will see you in 3 months.

## 2013-12-05 NOTE — Progress Notes (Signed)
Tracey Mcclure Date of Birth: 12-22-1940   History of Present Illness: Tracey Mcclure is seen today for  followup. She has a history of hypertrophic obstructive cardiomyopathy, hypertension, and Mobitz type I second-degree AV block. She has had a difficult couple of months. She had the flu in January. In March she went for a physical and was found to have iron deficiency anemia. Stool was heme positive. CT of the abdomen showed a possible abdominal mass. Subsequent EGD was negative. Colonoscopy last June was normal. She is scheduled for capsule endoscopy next week. She has also been diagnosed with MGUS and is seeing Hematology. Scheduled for 24 hr urine collection and skeletal survey next week. On follow up today she complains of feeling tired. She notes her heart turning flips. This has been noted for the last 4 months. She is off ASA due to heme positive stool. Her thyroid dose was increased in March.   Current Outpatient Prescriptions on File Prior to Visit  Medication Sig Dispense Refill  . atorvastatin (LIPITOR) 40 MG tablet Take 40 mg by mouth daily.      . DiphenhydrAMINE HCl (BENADRYL PO) Take by mouth as needed.        . hydrochlorothiazide 25 MG tablet Take 12.5 mg by mouth every other day.       . levothyroxine (SYNTHROID, LEVOTHROID) 125 MCG tablet Take 125 mcg by mouth daily.       . ranitidine (ZANTAC) 150 MG capsule Take 150 mg by mouth every evening.      . vitamin B-12 (CYANOCOBALAMIN) 1000 MCG tablet Take 1,000 mcg by mouth daily.      . ferrous sulfate 325 (65 FE) MG tablet Take 325 mg by mouth daily.        No current facility-administered medications on file prior to visit.    No Known Allergies  Past Medical History  Diagnosis Date  . Hypertrophic obstructive cardiomyopathy   . Mitral insufficiency   . HTN (hypertension)     CONTROLLED  . Palpitations   . Hypercholesteremia   . Hypothyroidism   . GERD (gastroesophageal reflux disease)   . Obesity   . Colon  adenoma   . 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  . Iron deficiency anemia, unspecified 10/14/2013  . Gastric mass 10/14/2013  . 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  . Atrial fibrillation 12/05/2013    Past Surgical History  Procedure Laterality Date  . Abdominal hysterectomy    . Ovary surgery      BENIGN TUMOR    History  Smoking status  . Never Smoker   Smokeless tobacco  . Never Used    History  Alcohol Use No    Family History  Problem Relation Age of Onset  . Heart attack Brother 2  . Prostate cancer Brother   . Breast cancer Sister 65    Review of Systems: As noted in history of present illness.  All other systems were reviewed and are negative.  Physical Exam: BP 134/62  Pulse 51  Ht 5\' 4"  (1.626 m)  Wt 173 lb 12.8 oz (78.835 kg)  BMI 29.82 kg/m2 She is a pleasant overweight white female in no acute distress. HEENT exam is unremarkable. Sclerae are clear. Oropharynx is clear. Neck is supple no JVD, adenopathy, thyromegaly, or bruits. Lungs are clear. Cardiac exam reveals an irregular rate and rhythm with a harsh grade 2/6 systolic murmur at the left sternal border. She has no S3. Abdomen  is obese, soft, nontender. She has no edema. Pedal pulses are 2+. She is alert and oriented x3. Cranial nerves II through XII are intact.  LABORATORY DATA: Reviewed from 12/07/13: CMET is normal. HGb 8.8. Cholesterol 171, trig-75, HDL 72, LDL 84. TSH- 11.95  Ecg today: Atrial fibrillation with rate 51 bpm, Nonspecific ST-T abnormality.  Assessment / Plan: 1. Atrial fibrillation- new onset with bradycardia. Duration unclear. History of sinus bradycardia with second-degree Mobitz type I AV block. Age, HCM, Htn, and thyroid disease all predispose to atrial fibrillation. Will discontinue verapamil due to low HR. Will update Echo. CHAD-Vasc score of 3-4. Not a candidate for anticoagulation currently due to Heme positive stools and  anemia. Will await GI evalyation and recommendation on whether she will be a candidate for anticoagulation in the future. Would not consider restoration of sinus rhythm at this time until she can be anticoagulated.   2. Hypertrophic cardiomyopathy. Echocardiogram in May of 2013 showed mild LVH with focal basal hypertrophy of the septum. Ejection fraction was normal. There was dynamic left ventricular outflow tract obstruction with a mean gradient of 36 mmHg. There was moderate mitral insufficiency with systolic anterior motion. Will update Echo.  3. Mitral insufficiency related to her hypertrophic cardiomyopathy. Asymptomatic.  4. Hypertension, controlled.  5. Iron deficiency anemia with heme positive stool.  6. MGUS

## 2013-12-09 DIAGNOSIS — D500 Iron deficiency anemia secondary to blood loss (chronic): Secondary | ICD-10-CM | POA: Diagnosis not present

## 2013-12-09 DIAGNOSIS — K921 Melena: Secondary | ICD-10-CM | POA: Diagnosis not present

## 2013-12-18 ENCOUNTER — Other Ambulatory Visit: Payer: Self-pay | Admitting: Gastroenterology

## 2013-12-18 ENCOUNTER — Ambulatory Visit
Admission: RE | Admit: 2013-12-18 | Discharge: 2013-12-18 | Disposition: A | Discharge status: Home / Self Care | Payer: Medicare Other | Source: Ambulatory Visit | Attending: Gastroenterology | Admitting: Gastroenterology

## 2013-12-18 DIAGNOSIS — T189XXA Foreign body of alimentary tract, part unspecified, initial encounter: Secondary | ICD-10-CM

## 2013-12-18 DIAGNOSIS — R935 Abnormal findings on diagnostic imaging of other abdominal regions, including retroperitoneum: Secondary | ICD-10-CM | POA: Diagnosis not present

## 2013-12-27 ENCOUNTER — Ambulatory Visit (HOSPITAL_COMMUNITY): Payer: Medicare Other | Attending: Cardiology | Admitting: Radiology

## 2013-12-27 DIAGNOSIS — R0609 Other forms of dyspnea: Secondary | ICD-10-CM | POA: Diagnosis not present

## 2013-12-27 DIAGNOSIS — I421 Obstructive hypertrophic cardiomyopathy: Secondary | ICD-10-CM | POA: Diagnosis not present

## 2013-12-27 DIAGNOSIS — I4891 Unspecified atrial fibrillation: Secondary | ICD-10-CM

## 2013-12-27 DIAGNOSIS — R0989 Other specified symptoms and signs involving the circulatory and respiratory systems: Principal | ICD-10-CM | POA: Insufficient documentation

## 2013-12-27 DIAGNOSIS — I10 Essential (primary) hypertension: Secondary | ICD-10-CM

## 2013-12-27 DIAGNOSIS — D509 Iron deficiency anemia, unspecified: Secondary | ICD-10-CM

## 2013-12-27 DIAGNOSIS — I340 Nonrheumatic mitral (valve) insufficiency: Secondary | ICD-10-CM

## 2013-12-27 NOTE — Progress Notes (Signed)
Echocardiogram performed.  

## 2014-01-23 DIAGNOSIS — Z961 Presence of intraocular lens: Secondary | ICD-10-CM | POA: Diagnosis not present

## 2014-01-23 DIAGNOSIS — H5700 Unspecified anomaly of pupillary function: Secondary | ICD-10-CM | POA: Diagnosis not present

## 2014-01-23 DIAGNOSIS — H259 Unspecified age-related cataract: Secondary | ICD-10-CM | POA: Diagnosis not present

## 2014-02-04 ENCOUNTER — Other Ambulatory Visit: Payer: Medicare Other

## 2014-02-05 DIAGNOSIS — Z1231 Encounter for screening mammogram for malignant neoplasm of breast: Secondary | ICD-10-CM | POA: Diagnosis not present

## 2014-02-11 ENCOUNTER — Ambulatory Visit: Payer: Medicare Other

## 2014-02-11 ENCOUNTER — Ambulatory Visit (HOSPITAL_COMMUNITY)
Admission: RE | Admit: 2014-02-11 | Discharge: 2014-02-11 | Disposition: A | Discharge status: Home / Self Care | Payer: Medicare Other | Source: Ambulatory Visit | Attending: Hematology and Oncology | Admitting: Hematology and Oncology

## 2014-02-11 ENCOUNTER — Other Ambulatory Visit (HOSPITAL_BASED_OUTPATIENT_CLINIC_OR_DEPARTMENT_OTHER): Payer: Medicare Other

## 2014-02-11 DIAGNOSIS — D472 Monoclonal gammopathy: Secondary | ICD-10-CM

## 2014-02-11 DIAGNOSIS — I700 Atherosclerosis of aorta: Secondary | ICD-10-CM | POA: Diagnosis not present

## 2014-02-11 DIAGNOSIS — C9000 Multiple myeloma not having achieved remission: Secondary | ICD-10-CM | POA: Insufficient documentation

## 2014-02-11 LAB — COMPREHENSIVE METABOLIC PANEL (CC13)
ALT: 18 U/L (ref 0–55)
AST: 23 U/L (ref 5–34)
Albumin: 3.9 g/dL (ref 3.5–5.0)
Alkaline Phosphatase: 97 U/L (ref 40–150)
Anion Gap: 9 mEq/L (ref 3–11)
BUN: 9.1 mg/dL (ref 7.0–26.0)
CALCIUM: 9.8 mg/dL (ref 8.4–10.4)
CHLORIDE: 106 meq/L (ref 98–109)
CO2: 25 mEq/L (ref 22–29)
CREATININE: 0.8 mg/dL (ref 0.6–1.1)
Glucose: 97 mg/dl (ref 70–140)
POTASSIUM: 3.5 meq/L (ref 3.5–5.1)
Sodium: 139 mEq/L (ref 136–145)
Total Bilirubin: 0.9 mg/dL (ref 0.20–1.20)
Total Protein: 6.8 g/dL (ref 6.4–8.3)

## 2014-02-11 LAB — CBC WITH DIFFERENTIAL/PLATELET
BASO%: 0.8 % (ref 0.0–2.0)
Basophils Absolute: 0 10*3/uL (ref 0.0–0.1)
EOS%: 2.4 % (ref 0.0–7.0)
Eosinophils Absolute: 0.2 10*3/uL (ref 0.0–0.5)
HCT: 37.7 % (ref 34.8–46.6)
HGB: 12.6 g/dL (ref 11.6–15.9)
LYMPH#: 2.2 10*3/uL (ref 0.9–3.3)
LYMPH%: 33.8 % (ref 14.0–49.7)
MCH: 31.3 pg (ref 25.1–34.0)
MCHC: 33.3 g/dL (ref 31.5–36.0)
MCV: 94 fL (ref 79.5–101.0)
MONO#: 0.4 10*3/uL (ref 0.1–0.9)
MONO%: 6.6 % (ref 0.0–14.0)
NEUT#: 3.6 10*3/uL (ref 1.5–6.5)
NEUT%: 56.4 % (ref 38.4–76.8)
Platelets: 243 10*3/uL (ref 145–400)
RBC: 4.02 10*6/uL (ref 3.70–5.45)
RDW: 13.3 % (ref 11.2–14.5)
WBC: 6.4 10*3/uL (ref 3.9–10.3)

## 2014-02-13 DIAGNOSIS — R928 Other abnormal and inconclusive findings on diagnostic imaging of breast: Secondary | ICD-10-CM | POA: Diagnosis not present

## 2014-02-13 LAB — SPEP & IFE WITH QIG
ALBUMIN ELP: 60.4 % (ref 55.8–66.1)
ALPHA-1-GLOBULIN: 3.4 % (ref 2.9–4.9)
Alpha-2-Globulin: 9.2 % (ref 7.1–11.8)
BETA 2: 7 % — AB (ref 3.2–6.5)
Beta Globulin: 6.7 % (ref 4.7–7.2)
Gamma Globulin: 13.3 % (ref 11.1–18.8)
IGM, SERUM: 116 mg/dL (ref 52–322)
IgA: 397 mg/dL — ABNORMAL HIGH (ref 69–380)
IgG (Immunoglobin G), Serum: 876 mg/dL (ref 690–1700)
M-Spike, %: 0.24 g/dL
Total Protein, Serum Electrophoresis: 6.6 g/dL (ref 6.0–8.3)

## 2014-02-13 LAB — KAPPA/LAMBDA LIGHT CHAINS
KAPPA FREE LGHT CHN: 2.31 mg/dL — AB (ref 0.33–1.94)
KAPPA LAMBDA RATIO: 0.6 (ref 0.26–1.65)
LAMBDA FREE LGHT CHN: 3.82 mg/dL — AB (ref 0.57–2.63)

## 2014-02-14 DIAGNOSIS — D472 Monoclonal gammopathy: Secondary | ICD-10-CM | POA: Diagnosis not present

## 2014-02-18 ENCOUNTER — Ambulatory Visit (HOSPITAL_BASED_OUTPATIENT_CLINIC_OR_DEPARTMENT_OTHER): Payer: Medicare Other | Admitting: Hematology and Oncology

## 2014-02-18 ENCOUNTER — Encounter: Payer: Self-pay | Admitting: Hematology and Oncology

## 2014-02-18 VITALS — BP 142/52 | HR 60 | Temp 97.9°F | Resp 18 | Ht 64.0 in | Wt 175.0 lb

## 2014-02-18 DIAGNOSIS — D472 Monoclonal gammopathy: Secondary | ICD-10-CM | POA: Diagnosis not present

## 2014-02-18 LAB — UIFE/LIGHT CHAINS/TP QN, 24-HR UR
Albumin, U: DETECTED
FREE KAPPA LT CHAINS, UR: 0.1 mg/dL — AB (ref 0.14–2.42)
FREE LT CHN EXCR RATE: 2.8 mg/d
Free Kappa/Lambda Ratio: 0.2 ratio — ABNORMAL LOW (ref 2.04–10.37)
Free Lambda Excretion/Day: 13.72 mg/d
Free Lambda Lt Chains,Ur: 0.49 mg/dL (ref 0.02–0.67)
Time: 24 hours
Total Protein, Urine-Ur/day: 31 mg/d (ref 10–140)
Total Protein, Urine: 1.1 mg/dL
Volume, Urine: 2800 mL

## 2014-02-18 LAB — UPEP/TP, 24-HR URINE
Collection Interval: 24 hours
TOTAL VOLUME, URINE: 2800 mL
Total Protein, Urine: <3 mg/dL

## 2014-02-18 NOTE — Assessment & Plan Note (Signed)
Blood tests, urine test and skeletal survey are not consistent with multiple myeloma. 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 I discussed with the natural history of multiple myeloma. At present time, she does not require further investigation or treatment.

## 2014-02-18 NOTE — Progress Notes (Signed)
[place] OFFICE PROGRESS NOTE  Patient Care Team: Jerlyn Ly, MD as PCP - General (Internal Medicine) Heath Lark, MD as Consulting Physician (Hematology and Oncology)  SUMMARY OF ONCOLOGIC HISTORY: She was found to have abnormal CBC from routine blood tests. On 10/07/2013, CBC showed hemoglobin of 8.8 with evidence of iron deficiency. Ferritin level was 7. And review her blood work from 2012, the patient is not anemic. 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 Her last colonoscopy was in 2014 which showed no evidence of abnormalities apart from some benign colon polyps. CT scan of the abdomen show abnormal mass in the stomach. EGD did not reveal any abnormalities. Anemia resolved with iron supplements. In June 2015, further workup showed no progression of MGUS and she is being observed.  INTERVAL HISTORY: Please see below for problem oriented charting. She continues to take oral iron supplements which caused some cramps in her abdomen. Overall she feels well. Denies new bone pain.  REVIEW OF SYSTEMS:   Constitutional: Denies fevers, chills or abnormal weight loss Eyes: Denies blurriness of vision Ears, nose, mouth, throat, and face: Denies mucositis or sore throat Respiratory: Denies cough, dyspnea or wheezes Cardiovascular: Denies palpitation, chest discomfort or lower extremity swelling Gastrointestinal:  Denies nausea, heartburn or change in bowel habits Skin: Denies abnormal skin rashes Lymphatics: Denies new lymphadenopathy or easy bruising Neurological:Denies numbness, tingling or new weaknesses Behavioral/Psych: Mood is stable, no new changes  All other systems were reviewed with the patient and are negative.  I have reviewed the past medical history, past surgical history, social history and family history with the patient and they are unchanged from previous  note.  ALLERGIES:  has No Known Allergies.  MEDICATIONS:  Current Outpatient Prescriptions  Medication Sig Dispense Refill  . acetaminophen (TYLENOL) 325 MG tablet Take 650 mg by mouth every 6 (six) hours as needed.      [name] Kitchen atorvastatin (LIPITOR) 40 MG tablet Take 40 mg by mouth daily.      . cholecalciferol (VITAMIN D) 1000 UNITS tablet Take 1,000 Units by mouth daily.      . DiphenhydrAMINE HCl (BENADRYL PO) Take by mouth as needed.        . ferrous sulfate 325 (65 FE) MG tablet Take 325 mg by mouth daily.       . hydrochlorothiazide 25 MG tablet Take 12.5 mg by mouth every other day.       . levothyroxine (SYNTHROID, LEVOTHROID) 125 MCG tablet Take 125 mcg by mouth daily.       . Probiotic Product (PROBIOTIC DAILY PO) Take by mouth daily.      . ranitidine (ZANTAC) 150 MG capsule Take 150 mg by mouth every evening.      . vitamin B-12 (CYANOCOBALAMIN) 1000 MCG tablet Take 1,000 mcg by mouth daily.       No current facility-administered medications for this visit.    PHYSICAL EXAMINATION: ECOG PERFORMANCE STATUS: 0 - Asymptomatic  Filed Vitals:   02/18/14 1242  BP: 142/52  Pulse: 60  Temp: 97.9 F (36.6 C)  Resp: 18   Filed Weights   02/18/14 1242  Weight: 175 lb (79.379 kg)    GENERAL:alert, no distress and comfortable SKIN: skin color, texture, turgor are normal, no rashes or significant lesions EYES: normal, Conjunctiva are pink and non-injected, sclera clear Musculoskeletal:no cyanosis of digits and no clubbing  NEURO: alert & oriented x 3 with  fluent speech, no focal motor/sensory deficits  LABORATORY DATA:  I have reviewed the data as listed    Component Value Date/Time   NA 139 02/11/2014 1355   NA 138 04/07/2011 1200   K 3.5 02/11/2014 1355   K 3.2* 04/07/2011 1200   CL 100 04/07/2011 1200   CO2 25 02/11/2014 1355   CO2 28 04/07/2011 1200   GLUCOSE 97 02/11/2014 1355   GLUCOSE 103* 04/07/2011 1200   BUN 9.1 02/11/2014 1355   BUN 14 04/07/2011 1200   CREATININE 0.8  02/11/2014 1355   CREATININE 0.89 04/07/2011 1200   CALCIUM 9.8 02/11/2014 1355   CALCIUM 10.4 04/07/2011 1200   PROT 6.8 02/11/2014 1355   ALBUMIN 3.9 02/11/2014 1355   AST 23 02/11/2014 1355   ALT 18 02/11/2014 1355   ALKPHOS 97 02/11/2014 1355   BILITOT 0.90 02/11/2014 1355   GFRNONAA >60 04/07/2011 1200   GFRAA >60 04/07/2011 1200    No results found for this basename: SPEP,  UPEP,   kappa and lambda light chains    Lab Results  Component Value Date   WBC 6.4 02/11/2014   NEUTROABS 3.6 02/11/2014   HGB 12.6 02/11/2014   HCT 37.7 02/11/2014   MCV 94.0 02/11/2014   PLT 243 02/11/2014      Chemistry      Component Value Date/Time   NA 139 02/11/2014 1355   NA 138 04/07/2011 1200   K 3.5 02/11/2014 1355   K 3.2* 04/07/2011 1200   CL 100 04/07/2011 1200   CO2 25 02/11/2014 1355   CO2 28 04/07/2011 1200   BUN 9.1 02/11/2014 1355   BUN 14 04/07/2011 1200   CREATININE 0.8 02/11/2014 1355   CREATININE 0.89 04/07/2011 1200      Component Value Date/Time   CALCIUM 9.8 02/11/2014 1355   CALCIUM 10.4 04/07/2011 1200   ALKPHOS 97 02/11/2014 1355   AST 23 02/11/2014 1355   ALT 18 02/11/2014 1355   BILITOT 0.90 02/11/2014 1355       RADIOGRAPHIC STUDIES: I reviewed the skeletal survey. I do not believe the abnormal changes seen on the skull is compatible with myeloma. I recommend observation only. I have personally reviewed the radiological images as listed and agreed with the findings in the report.   ASSESSMENT & PLAN:  MGUS (monoclonal gammopathy of unknown significance) Blood tests, urine test and skeletal survey are not consistent with multiple myeloma. 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 I discussed with the natural history of multiple myeloma. At present time, she does not require further investigation or treatment.   The patient is instructed to discontinue oral iron supplement as her anemia has resolved. Hopefully, this will cause her abdominal cramps to go  away.  Orders Placed This Encounter  Procedures  . DG Bone Survey Met    Standing Status: Future     Number of Occurrences:      Standing Expiration Date: 04/20/2015    Order Specific Question:  Reason for Exam (SYMPTOM  OR DIAGNOSIS REQUIRED)    Answer:  staging myeloma    Order Specific Question:  Preferred imaging location?    Answer:  [place], 24-Hour Urine  . CBC with Differential    Standing Status: Future     Number of Occurrences:      Standing Expiration Date: 03/25/2015  . Comprehensive metabolic panel    Standing Status: Future  Number of Occurrences:      Standing Expiration Date: 03/25/2015  . SPEP & IFE with QIG    Standing Status: Future     Number of Occurrences:      Standing Expiration Date: 03/25/2015  . Kappa/lambda light chains    Standing Status: Future     Number of Occurrences:      Standing Expiration Date: 03/25/2015  . Beta 2 microglobulin, serum    Standing Status: Future     Number of Occurrences:      Standing Expiration Date: 03/25/2015   All questions were answered. The patient knows to call the clinic with any problems, questions or concerns. No barriers to learning was detected. I spent 15 minutes counseling the patient face to face. The total time spent in the appointment was 20 minutes and more than 50% was on counseling and review of test results     [facility], [place], MD 02/18/2014 5:58 PM

## 2014-03-04 ENCOUNTER — Encounter: Payer: Self-pay | Admitting: Cardiology

## 2014-03-04 ENCOUNTER — Ambulatory Visit (INDEPENDENT_AMBULATORY_CARE_PROVIDER_SITE_OTHER): Payer: Medicare Other | Admitting: Cardiology

## 2014-03-04 VITALS — BP 160/72 | HR 64 | Ht 66.0 in | Wt 175.1 lb

## 2014-03-04 DIAGNOSIS — I10 Essential (primary) hypertension: Secondary | ICD-10-CM

## 2014-03-04 DIAGNOSIS — I4891 Unspecified atrial fibrillation: Secondary | ICD-10-CM

## 2014-03-04 DIAGNOSIS — I340 Nonrheumatic mitral (valve) insufficiency: Secondary | ICD-10-CM

## 2014-03-04 DIAGNOSIS — I480 Paroxysmal atrial fibrillation: Secondary | ICD-10-CM

## 2014-03-04 DIAGNOSIS — I059 Rheumatic mitral valve disease, unspecified: Secondary | ICD-10-CM | POA: Diagnosis not present

## 2014-03-04 DIAGNOSIS — I421 Obstructive hypertrophic cardiomyopathy: Secondary | ICD-10-CM

## 2014-03-04 DIAGNOSIS — I441 Atrioventricular block, second degree: Secondary | ICD-10-CM

## 2014-03-04 NOTE — Progress Notes (Signed)
Tracey Mcclure Date of Birth: 28-Aug-1940   History of Present Illness: Tracey Mcclure is seen today for  followup. She has a history of hypertrophic obstructive cardiomyopathy, hypertension, and Mobitz type I second-degree AV block. When seen in April she was noted to be in atrial fibrillation with a slow ventricular response. She had only mild palpitations. Verapamil was discontinued. Since then she has completed her work up with oncology and she has MGUS- not myeloma. Prior extensive GI work up for heme positive stool and iron deficiency anemia was unrevealing. She feels well today. No chest pain, dyspnea, palpitations, or dizziness.    Current Outpatient Prescriptions on File Prior to Visit  Medication Sig Dispense Refill  . acetaminophen (TYLENOL) 325 MG tablet Take 650 mg by mouth every 6 (six) hours as needed.      [name] Kitchen atorvastatin (LIPITOR) 40 MG tablet Take 40 mg by mouth daily.      . cholecalciferol (VITAMIN D) 1000 UNITS tablet Take 1,000 Units by mouth daily.      . DiphenhydrAMINE HCl (BENADRYL PO) Take by mouth as needed.        . ferrous sulfate 325 (65 FE) MG tablet Take 325 mg by mouth daily.       . hydrochlorothiazide 25 MG tablet Take 12.5 mg by mouth every other day.       . levothyroxine (SYNTHROID, LEVOTHROID) 125 MCG tablet Take 125 mcg by mouth daily.       . Probiotic Product (PROBIOTIC DAILY PO) Take by mouth daily.      . ranitidine (ZANTAC) 150 MG capsule Take 150 mg by mouth every evening.      . vitamin B-12 (CYANOCOBALAMIN) 1000 MCG tablet Take 1,000 mcg by mouth daily.       No current facility-administered medications on file prior to visit.    No Known Allergies  Past Medical History  Diagnosis Date  . Hypertrophic obstructive cardiomyopathy   . Mitral insufficiency   . HTN (hypertension)     CONTROLLED  . Palpitations   . Hypercholesteremia   . Hypothyroidism   . GERD (gastroesophageal reflux disease)   . Obesity   . Colon adenoma   . Mobitz  type 1 second degree atrioventricular block 12/03/2012  . Iron deficiency anemia, unspecified 10/14/2013  . Gastric mass 10/14/2013  . 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  . Atrial fibrillation 12/05/2013    Past Surgical History  Procedure Laterality Date  . Abdominal hysterectomy    . Ovary surgery      BENIGN TUMOR    History  Smoking status  . Never Smoker   Smokeless tobacco  . Never Used    History  Alcohol Use No    Family History  Problem Relation Age of Onset  . Heart attack Brother 70  . Prostate cancer Brother   . Breast cancer Sister 87    Review of Systems: As noted in history of present illness.  All other systems were reviewed and are negative.  Physical Exam: BP 160/72  Pulse 64  Ht 5\' 6"  (1.676 m)  Wt 175 lb 1.6 oz (79.425 kg)  BMI 28.28 kg/m2 She is a pleasant overweight white female in no acute distress. HEENT exam is unremarkable. Sclerae are clear. Oropharynx is clear. Neck is supple no JVD, adenopathy, thyromegaly, or bruits. Lungs are clear. Cardiac exam reveals a regular rate and rhythm with a harsh grade 2/6 systolic murmur at the left sternal border. She has no  S3. Abdomen is obese, soft, nontender. She has no edema. Pedal pulses are 2+. She is alert and oriented x3. Cranial nerves II through XII are intact.  LABORATORY DATA: Ecg today: NSR rate 64 bpm. Septal infarct- old. Nonspecific ST-T wave abnormality.  Echo:12/27/13:Study Conclusions  - Left ventricle: E/e&' >10 suggestive of elevated LV filling pressures. There was severe focal basal hypertrophy. Systolic function was normal. The estimated ejection fraction was in the range of 60% to 65%. There was dynamic obstruction during Valsalva in the outflow tract, with a peak velocity of 345 cm/sec and a peak gradient of 48 mm Hg c/w IHSS - Aortic valve: Moderate thickening and calcification, consistent with sclerosis. There was mild regurgitation. - Aorta: Ascending  aortic diameter: 40 mm (S). - Ascending aorta: The ascending aorta was mildly dilated. - Mitral valve: Calcified annulus. There was mild systolic anterior motion of the anterior leaflet and chordal structures. There was moderate regurgitation. - Left atrium: The atrium was mildly dilated. - Right ventricle: The cavity size was mildly dilated.     Assessment / Plan: 1. Atrial fibrillation- isolated episode in April. Now in NSR. History of sinus bradycardia with second-degree Mobitz type I AV block.  CHAD-Vasc score of 3-4. Previously not anticoagulated due to heme positive stool and anemia. I think if she has recurrent Afib I would recommend long term anticoagulation. Will monitor for now and follow up in 6 months.  2. Hypertrophic cardiomyopathy. Echocardiogram is stable from 2013. She is asymptomatic. Not a candidate for beta blocker or verapamil due to bradycardia and AV block.   3. Mitral insufficiency related to her hypertrophic cardiomyopathy. Asymptomatic.  4. Hypertension, controlled.  5. Iron deficiency anemia with heme positive stool. Negative GI work up.  6. MGUS

## 2014-03-04 NOTE — Patient Instructions (Signed)
Continue your current therapy  I will see you in 6 months.   

## 2014-03-18 DIAGNOSIS — H5700 Unspecified anomaly of pupillary function: Secondary | ICD-10-CM | POA: Diagnosis not present

## 2014-03-18 DIAGNOSIS — H25049 Posterior subcapsular polar age-related cataract, unspecified eye: Secondary | ICD-10-CM | POA: Diagnosis not present

## 2014-03-18 DIAGNOSIS — H21569 Pupillary abnormality, unspecified eye: Secondary | ICD-10-CM | POA: Diagnosis not present

## 2014-03-18 DIAGNOSIS — H2510 Age-related nuclear cataract, unspecified eye: Secondary | ICD-10-CM | POA: Diagnosis not present

## 2014-03-18 DIAGNOSIS — H25019 Cortical age-related cataract, unspecified eye: Secondary | ICD-10-CM | POA: Diagnosis not present

## 2014-03-28 DIAGNOSIS — D509 Iron deficiency anemia, unspecified: Secondary | ICD-10-CM | POA: Diagnosis not present

## 2014-03-28 DIAGNOSIS — Z79899 Other long term (current) drug therapy: Secondary | ICD-10-CM | POA: Diagnosis not present

## 2014-04-04 DIAGNOSIS — I4891 Unspecified atrial fibrillation: Secondary | ICD-10-CM | POA: Diagnosis not present

## 2014-04-04 DIAGNOSIS — I517 Cardiomegaly: Secondary | ICD-10-CM | POA: Diagnosis not present

## 2014-04-04 DIAGNOSIS — I10 Essential (primary) hypertension: Secondary | ICD-10-CM | POA: Diagnosis not present

## 2014-04-04 DIAGNOSIS — D509 Iron deficiency anemia, unspecified: Secondary | ICD-10-CM | POA: Diagnosis not present

## 2014-04-04 DIAGNOSIS — E785 Hyperlipidemia, unspecified: Secondary | ICD-10-CM | POA: Diagnosis not present

## 2014-04-04 DIAGNOSIS — R7301 Impaired fasting glucose: Secondary | ICD-10-CM | POA: Diagnosis not present

## 2014-04-04 DIAGNOSIS — Z6827 Body mass index (BMI) 27.0-27.9, adult: Secondary | ICD-10-CM | POA: Diagnosis not present

## 2014-04-04 DIAGNOSIS — E039 Hypothyroidism, unspecified: Secondary | ICD-10-CM | POA: Diagnosis not present

## 2014-04-09 DIAGNOSIS — D485 Neoplasm of uncertain behavior of skin: Secondary | ICD-10-CM | POA: Diagnosis not present

## 2014-04-09 DIAGNOSIS — L570 Actinic keratosis: Secondary | ICD-10-CM | POA: Diagnosis not present

## 2014-04-09 DIAGNOSIS — L820 Inflamed seborrheic keratosis: Secondary | ICD-10-CM | POA: Diagnosis not present

## 2014-05-01 DIAGNOSIS — L570 Actinic keratosis: Secondary | ICD-10-CM | POA: Diagnosis not present

## 2014-05-20 DIAGNOSIS — Z23 Encounter for immunization: Secondary | ICD-10-CM | POA: Diagnosis not present

## 2014-06-05 DIAGNOSIS — L570 Actinic keratosis: Secondary | ICD-10-CM | POA: Diagnosis not present

## 2014-07-09 DIAGNOSIS — D509 Iron deficiency anemia, unspecified: Secondary | ICD-10-CM | POA: Diagnosis not present

## 2014-08-22 ENCOUNTER — Ambulatory Visit: Payer: Medicare Other | Admitting: Cardiology

## 2014-08-27 DIAGNOSIS — L259 Unspecified contact dermatitis, unspecified cause: Secondary | ICD-10-CM | POA: Diagnosis not present

## 2014-08-27 DIAGNOSIS — T498X5A Adverse effect of other topical agents, initial encounter: Secondary | ICD-10-CM | POA: Diagnosis not present

## 2014-08-27 DIAGNOSIS — D485 Neoplasm of uncertain behavior of skin: Secondary | ICD-10-CM | POA: Diagnosis not present

## 2014-09-15 DIAGNOSIS — S62316A Displaced fracture of base of fifth metacarpal bone, right hand, initial encounter for closed fracture: Secondary | ICD-10-CM | POA: Diagnosis not present

## 2014-09-22 ENCOUNTER — Ambulatory Visit (INDEPENDENT_AMBULATORY_CARE_PROVIDER_SITE_OTHER): Payer: Medicare Other | Admitting: Cardiology

## 2014-09-22 ENCOUNTER — Encounter: Payer: Self-pay | Admitting: Cardiology

## 2014-09-22 VITALS — BP 142/76 | HR 57 | Ht 65.0 in | Wt 178.4 lb

## 2014-09-22 DIAGNOSIS — I10 Essential (primary) hypertension: Secondary | ICD-10-CM | POA: Diagnosis not present

## 2014-09-22 DIAGNOSIS — E7800 Pure hypercholesterolemia, unspecified: Secondary | ICD-10-CM

## 2014-09-22 DIAGNOSIS — I421 Obstructive hypertrophic cardiomyopathy: Secondary | ICD-10-CM | POA: Diagnosis not present

## 2014-09-22 DIAGNOSIS — I480 Paroxysmal atrial fibrillation: Secondary | ICD-10-CM | POA: Diagnosis not present

## 2014-09-22 MED ORDER — APIXABAN 5 MG PO TABS: 5.0000 mg | ORAL_TABLET | Freq: Two times a day (BID) | ORAL | Status: DC

## 2014-09-22 NOTE — Progress Notes (Signed)
Tracey Mcclure Date of Birth: August 12, 1940   History of Present Illness: Tracey Mcclure is seen today for  followup of atrial fibrillation. She has a history of hypertrophic obstructive cardiomyopathy, hypertension, and Mobitz type I second-degree AV block. When seen in April she was noted to be in atrial fibrillation with a slow ventricular response. She had only mild palpitations. Verapamil was discontinued. Since then she has completed her work up with oncology and she has MGUS- not myeloma. Prior extensive GI work up for heme positive stool and iron deficiency anemia was unrevealing.  On follow up today she reports what feels like episodes of atrial fibrillation about every other week. She notes this mostly at night. HR will increase from 55-60 to mid 80s. It lasts 10-15 minutes. She denies any dizziness, dyspnea, or chest pain. She brings a BP diary and her BP has been under good control.  Current Outpatient Prescriptions on File Prior to Visit  Medication Sig Dispense Refill  . acetaminophen (TYLENOL) 325 MG tablet Take 650 mg by mouth every 6 (six) hours as needed.    [name] Kitchen atorvastatin (LIPITOR) 40 MG tablet Take 40 mg by mouth daily.    . cholecalciferol (VITAMIN D) 1000 UNITS tablet Take 1,000 Units by mouth daily.    . DiphenhydrAMINE HCl (BENADRYL PO) Take by mouth as needed.      . hydrochlorothiazide 25 MG tablet Take 12.5 mg by mouth every other day.     . levothyroxine (SYNTHROID, LEVOTHROID) 125 MCG tablet Take 125 mcg by mouth daily.     . Probiotic Product (PROBIOTIC DAILY PO) Take by mouth daily.    . ranitidine (ZANTAC) 150 MG capsule Take 150 mg by mouth every evening.    . vitamin B-12 (CYANOCOBALAMIN) 1000 MCG tablet Take 1,000 mcg by mouth daily.     No current facility-administered medications on file prior to visit.    No Known Allergies  Past Medical History  Diagnosis Date  . Hypertrophic obstructive cardiomyopathy   . Mitral insufficiency   . HTN (hypertension)      CONTROLLED  . Palpitations   . Hypercholesteremia   . Hypothyroidism   . GERD (gastroesophageal reflux disease)   . Obesity   . Colon adenoma   . 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  . Iron deficiency anemia, unspecified 10/14/2013  . Gastric mass 10/14/2013  . 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  . Atrial fibrillation 12/05/2013    Past Surgical History  Procedure Laterality Date  . Abdominal hysterectomy    . Ovary surgery      BENIGN TUMOR    History  Smoking status  . Never Smoker   Smokeless tobacco  . Never Used    History  Alcohol Use No    Family History  Problem Relation Age of Onset  . Heart attack Brother 69  . Prostate cancer Brother   . Breast cancer Sister 27    Review of Systems: As noted in history of present illness.  S/p fracture of her right hand after a fall.All other systems were reviewed and are negative.  Physical Exam: BP 142/76 mmHg  Pulse 57  Ht 5\' 5"  (1.651 m)  Wt 178 lb 6.4 oz (80.922 kg)  BMI 29.69 kg/m2 She is a pleasant overweight white female in no acute distress. HEENT exam is unremarkable. Sclerae are clear. Oropharynx is clear. Neck is supple no JVD, adenopathy, thyromegaly, or bruits. Lungs are clear. Cardiac exam reveals a regular  rate and rhythm with a harsh grade 2/6 systolic murmur at the left sternal border. She has no S3. Abdomen is obese, soft, nontender. She has no edema. Pedal pulses are 2+. She is alert and oriented x3. Cranial nerves II through XII are intact.  LABORATORY DATA:   Echo:12/27/13:Study Conclusions  - Left ventricle: E/e&' >10 suggestive of elevated LV filling pressures. There was severe focal basal hypertrophy. Systolic function was normal. The estimated ejection fraction was in the range of 60% to 65%. There was dynamic obstruction during Valsalva in the outflow tract, with a peak velocity of 345 cm/sec and a peak gradient of 48 mm Hg c/w IHSS -  Aortic valve: Moderate thickening and calcification, consistent with sclerosis. There was mild regurgitation. - Aorta: Ascending aortic diameter: 40 mm (S). - Ascending aorta: The ascending aorta was mildly dilated. - Mitral valve: Calcified annulus. There was mild systolic anterior motion of the anterior leaflet and chordal structures. There was moderate regurgitation. - Left atrium: The atrium was mildly dilated. - Right ventricle: The cavity size was mildly dilated.     Assessment / Plan: 1. Atrial fibrillation- it appears that she is having intermittent episodes.  History of sinus bradycardia with second-degree Mobitz type I AV block.  CHAD-Vasc score of 3-4. Previously not anticoagulated due to heme positive stool and anemia but extensive GI evaluation was unremarkable. I would recommend she be anticoagulated to reduce her risk of CVA. Will start her on Eliquis 5 mg bid. I will follow up in 6 months.  2. Hypertrophic cardiomyopathy. Echocardiogram is stable from 2013. She is asymptomatic. Not a candidate for beta blocker or verapamil due to bradycardia and AV block.   3. Mitral insufficiency related to her hypertrophic cardiomyopathy. Asymptomatic.  4. Hypertension, controlled.  5. Iron deficiency anemia with heme positive stool. Negative GI work up.  6. MGUS

## 2014-09-22 NOTE — Patient Instructions (Signed)
Start Eliquis 5 mg twice a day  Do not take ASA or NSAIDs.  I will see you in 6 months.

## 2014-09-25 DIAGNOSIS — S62316D Displaced fracture of base of fifth metacarpal bone, right hand, subsequent encounter for fracture with routine healing: Secondary | ICD-10-CM | POA: Diagnosis not present

## 2014-10-06 DIAGNOSIS — R358 Other polyuria: Secondary | ICD-10-CM | POA: Diagnosis not present

## 2014-10-06 DIAGNOSIS — I10 Essential (primary) hypertension: Secondary | ICD-10-CM | POA: Diagnosis not present

## 2014-10-06 DIAGNOSIS — E785 Hyperlipidemia, unspecified: Secondary | ICD-10-CM | POA: Diagnosis not present

## 2014-10-06 DIAGNOSIS — E039 Hypothyroidism, unspecified: Secondary | ICD-10-CM | POA: Diagnosis not present

## 2014-10-06 DIAGNOSIS — Z008 Encounter for other general examination: Secondary | ICD-10-CM | POA: Diagnosis not present

## 2014-10-06 DIAGNOSIS — R7301 Impaired fasting glucose: Secondary | ICD-10-CM | POA: Diagnosis not present

## 2014-10-13 DIAGNOSIS — I480 Paroxysmal atrial fibrillation: Secondary | ICD-10-CM | POA: Diagnosis not present

## 2014-10-13 DIAGNOSIS — J309 Allergic rhinitis, unspecified: Secondary | ICD-10-CM | POA: Diagnosis not present

## 2014-10-13 DIAGNOSIS — F4320 Adjustment disorder, unspecified: Secondary | ICD-10-CM | POA: Diagnosis not present

## 2014-10-13 DIAGNOSIS — Z6827 Body mass index (BMI) 27.0-27.9, adult: Secondary | ICD-10-CM | POA: Diagnosis not present

## 2014-10-13 DIAGNOSIS — E785 Hyperlipidemia, unspecified: Secondary | ICD-10-CM | POA: Diagnosis not present

## 2014-10-13 DIAGNOSIS — Z1389 Encounter for screening for other disorder: Secondary | ICD-10-CM | POA: Diagnosis not present

## 2014-10-13 DIAGNOSIS — I10 Essential (primary) hypertension: Secondary | ICD-10-CM | POA: Diagnosis not present

## 2014-10-13 DIAGNOSIS — Z0000 Encounter for general adult medical examination without abnormal findings: Secondary | ICD-10-CM | POA: Diagnosis not present

## 2014-10-13 DIAGNOSIS — R319 Hematuria, unspecified: Secondary | ICD-10-CM | POA: Diagnosis not present

## 2014-10-13 DIAGNOSIS — D509 Iron deficiency anemia, unspecified: Secondary | ICD-10-CM | POA: Diagnosis not present

## 2014-10-13 DIAGNOSIS — D472 Monoclonal gammopathy: Secondary | ICD-10-CM | POA: Diagnosis not present

## 2014-10-13 DIAGNOSIS — K219 Gastro-esophageal reflux disease without esophagitis: Secondary | ICD-10-CM | POA: Diagnosis not present

## 2014-10-14 ENCOUNTER — Telehealth (HOSPITAL_COMMUNITY): Payer: Self-pay | Admitting: *Deleted

## 2014-10-14 ENCOUNTER — Encounter: Payer: Self-pay | Admitting: Cardiology

## 2014-10-14 DIAGNOSIS — S62316D Displaced fracture of base of fifth metacarpal bone, right hand, subsequent encounter for fracture with routine healing: Secondary | ICD-10-CM | POA: Diagnosis not present

## 2014-11-10 DIAGNOSIS — S62316D Displaced fracture of base of fifth metacarpal bone, right hand, subsequent encounter for fracture with routine healing: Secondary | ICD-10-CM | POA: Diagnosis not present

## 2014-11-13 ENCOUNTER — Other Ambulatory Visit (HOSPITAL_COMMUNITY): Payer: Self-pay | Admitting: Internal Medicine

## 2014-11-13 DIAGNOSIS — R0989 Other specified symptoms and signs involving the circulatory and respiratory systems: Secondary | ICD-10-CM

## 2014-11-14 ENCOUNTER — Ambulatory Visit (HOSPITAL_COMMUNITY)
Admission: RE | Admit: 2014-11-14 | Discharge: 2014-11-14 | Disposition: A | Discharge status: Home / Self Care | Payer: Medicare Other | Source: Ambulatory Visit | Attending: Cardiology | Admitting: Cardiology

## 2014-11-14 DIAGNOSIS — R0989 Other specified symptoms and signs involving the circulatory and respiratory systems: Secondary | ICD-10-CM | POA: Diagnosis not present

## 2014-11-14 NOTE — Progress Notes (Addendum)
Renal artery duplex completed. Evidence for 1-59% diameter reduction in the right renal artery and normal patency in the left renal artery. [place]

## 2014-11-24 ENCOUNTER — Telehealth (HOSPITAL_COMMUNITY): Payer: Self-pay | Admitting: *Deleted

## 2014-12-09 DIAGNOSIS — S62316D Displaced fracture of base of fifth metacarpal bone, right hand, subsequent encounter for fracture with routine healing: Secondary | ICD-10-CM | POA: Diagnosis not present

## 2014-12-09 DIAGNOSIS — G5601 Carpal tunnel syndrome, right upper limb: Secondary | ICD-10-CM | POA: Diagnosis not present

## 2014-12-12 DIAGNOSIS — L708 Other acne: Secondary | ICD-10-CM | POA: Diagnosis not present

## 2014-12-19 ENCOUNTER — Telehealth: Payer: Self-pay | Admitting: Cardiology

## 2014-12-25 NOTE — Telephone Encounter (Signed)
Close encounter 

## 2015-01-08 DIAGNOSIS — G5601 Carpal tunnel syndrome, right upper limb: Secondary | ICD-10-CM | POA: Diagnosis not present

## 2015-01-08 DIAGNOSIS — S62316D Displaced fracture of base of fifth metacarpal bone, right hand, subsequent encounter for fracture with routine healing: Secondary | ICD-10-CM | POA: Diagnosis not present

## 2015-01-16 DIAGNOSIS — D509 Iron deficiency anemia, unspecified: Secondary | ICD-10-CM | POA: Diagnosis not present

## 2015-01-16 DIAGNOSIS — D649 Anemia, unspecified: Secondary | ICD-10-CM | POA: Diagnosis not present

## 2015-01-16 DIAGNOSIS — E039 Hypothyroidism, unspecified: Secondary | ICD-10-CM | POA: Diagnosis not present

## 2015-02-12 ENCOUNTER — Ambulatory Visit (HOSPITAL_COMMUNITY)
Admission: RE | Admit: 2015-02-12 | Discharge: 2015-02-12 | Disposition: A | Discharge status: Home / Self Care | Payer: Medicare Other | Source: Ambulatory Visit | Attending: Hematology and Oncology | Admitting: Hematology and Oncology

## 2015-02-12 ENCOUNTER — Other Ambulatory Visit (HOSPITAL_BASED_OUTPATIENT_CLINIC_OR_DEPARTMENT_OTHER): Payer: Medicare Other

## 2015-02-12 DIAGNOSIS — D472 Monoclonal gammopathy: Secondary | ICD-10-CM | POA: Insufficient documentation

## 2015-02-12 DIAGNOSIS — M4317 Spondylolisthesis, lumbosacral region: Secondary | ICD-10-CM | POA: Diagnosis not present

## 2015-02-12 LAB — CBC WITH DIFFERENTIAL/PLATELET
BASO%: 0.8 % (ref 0.0–2.0)
Basophils Absolute: 0.1 10*3/uL (ref 0.0–0.1)
EOS ABS: 0.2 10*3/uL (ref 0.0–0.5)
EOS%: 2.6 % (ref 0.0–7.0)
HEMATOCRIT: 36.4 % (ref 34.8–46.6)
HGB: 12.1 g/dL (ref 11.6–15.9)
LYMPH%: 30.5 % (ref 14.0–49.7)
MCH: 30.9 pg (ref 25.1–34.0)
MCHC: 33.2 g/dL (ref 31.5–36.0)
MCV: 93.1 fL (ref 79.5–101.0)
MONO#: 0.4 10*3/uL (ref 0.1–0.9)
MONO%: 6.6 % (ref 0.0–14.0)
NEUT%: 59.5 % (ref 38.4–76.8)
NEUTROS ABS: 3.7 10*3/uL (ref 1.5–6.5)
Platelets: 248 10*3/uL (ref 145–400)
RBC: 3.91 10*6/uL (ref 3.70–5.45)
RDW: 14.5 % (ref 11.2–14.5)
WBC: 6.2 10*3/uL (ref 3.9–10.3)
lymph#: 1.9 10*3/uL (ref 0.9–3.3)

## 2015-02-12 LAB — COMPREHENSIVE METABOLIC PANEL (CC13)
ALBUMIN: 3.8 g/dL (ref 3.5–5.0)
ALT: 16 U/L (ref 0–55)
AST: 23 U/L (ref 5–34)
Alkaline Phosphatase: 108 U/L (ref 40–150)
Anion Gap: 8 mEq/L (ref 3–11)
BUN: 13.4 mg/dL (ref 7.0–26.0)
CALCIUM: 9.8 mg/dL (ref 8.4–10.4)
CO2: 26 mEq/L (ref 22–29)
Chloride: 106 mEq/L (ref 98–109)
Creatinine: 0.9 mg/dL (ref 0.6–1.1)
EGFR: 66 mL/min/{1.73_m2} — ABNORMAL LOW (ref >90)
Glucose: 94 mg/dl (ref 70–140)
POTASSIUM: 3.8 meq/L (ref 3.5–5.1)
Sodium: 140 mEq/L (ref 136–145)
Total Bilirubin: 0.77 mg/dL (ref 0.20–1.20)
Total Protein: 6.7 g/dL (ref 6.4–8.3)

## 2015-02-16 LAB — SPEP & IFE WITH QIG
ALBUMIN ELP: 3.8 g/dL (ref 3.8–4.8)
ALPHA-2-GLOBULIN: 0.6 g/dL (ref 0.5–0.9)
Abnormal Protein Band1: 0.3 g/dL
Alpha-1-Globulin: 0.2 g/dL (ref 0.2–0.3)
Beta 2: 0.3 g/dL (ref 0.2–0.5)
Beta Globulin: 0.8 g/dL — ABNORMAL HIGH (ref 0.4–0.6)
GAMMA GLOBULIN: 0.9 g/dL (ref 0.8–1.7)
IGM, SERUM: 120 mg/dL (ref 52–322)
IgA: 423 mg/dL — ABNORMAL HIGH (ref 69–380)
IgG (Immunoglobin G), Serum: 1010 mg/dL (ref 690–1700)
Total Protein, Serum Electrophoresis: 6.6 g/dL (ref 6.1–8.1)

## 2015-02-16 LAB — KAPPA/LAMBDA LIGHT CHAINS
KAPPA FREE LGHT CHN: 2.98 mg/dL — AB (ref 0.33–1.94)
KAPPA LAMBDA RATIO: 0.81 (ref 0.26–1.65)
Lambda Free Lght Chn: 3.66 mg/dL — ABNORMAL HIGH (ref 0.57–2.63)

## 2015-02-16 LAB — BETA 2 MICROGLOBULIN, SERUM: BETA 2 MICROGLOBULIN: 2.44 mg/L (ref <=2.51)

## 2015-02-17 DIAGNOSIS — Z1231 Encounter for screening mammogram for malignant neoplasm of breast: Secondary | ICD-10-CM | POA: Diagnosis not present

## 2015-02-19 ENCOUNTER — Ambulatory Visit (HOSPITAL_BASED_OUTPATIENT_CLINIC_OR_DEPARTMENT_OTHER): Payer: Medicare Other | Admitting: Hematology and Oncology

## 2015-02-19 ENCOUNTER — Encounter: Payer: Self-pay | Admitting: Hematology and Oncology

## 2015-02-19 VITALS — BP 140/62 | HR 51 | Temp 98.2°F | Resp 18 | Ht 65.0 in | Wt 175.4 lb

## 2015-02-19 DIAGNOSIS — D472 Monoclonal gammopathy: Secondary | ICD-10-CM

## 2015-02-19 NOTE — Progress Notes (Signed)
[facility] OFFICE PROGRESS NOTE  Patient Care Team: Crist Infante, MD as PCP - General (Internal Medicine) Heath Lark, MD as Consulting Physician (Hematology and Oncology)  SUMMARY OF ONCOLOGIC HISTORY:  She was found to have abnormal CBC from routine blood tests. On 10/07/2013, CBC showed hemoglobin of 8.8 with evidence of iron deficiency. Ferritin level was 7. And review her blood work from 2012, the patient is not anemic. 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 Her last colonoscopy was in 2014 which showed no evidence of abnormalities apart from some benign colon polyps. CT scan of the abdomen show abnormal mass in the stomach. EGD did not reveal any abnormalities. Anemia resolved with iron supplements. In June 2015, further workup showed no progression of MGUS and she is being observed.  INTERVAL HISTORY: Please see below for problem oriented charting.  Overall she feels well. Denies new bone pain. She denies recent infection.  REVIEW OF SYSTEMS:   Constitutional: Denies fevers, chills or abnormal weight loss Eyes: Denies blurriness of vision Ears, nose, mouth, throat, and face: Denies mucositis or sore throat Respiratory: Denies cough, dyspnea or wheezes Cardiovascular: Denies palpitation, chest discomfort or lower extremity swelling Gastrointestinal:  Denies nausea, heartburn or change in bowel habits Skin: Denies abnormal skin rashes Lymphatics: Denies new lymphadenopathy or easy bruising Neurological:Denies numbness, tingling or new weaknesses Behavioral/Psych: Mood is stable, no new changes  All other systems were reviewed with the patient and are negative.  I have reviewed the past medical history, past surgical history, social history and family history with the patient and they are unchanged from previous note.  ALLERGIES:  has No Known Allergies.  MEDICATIONS:   Current Outpatient Prescriptions  Medication Sig Dispense Refill  . acetaminophen (TYLENOL) 325 MG tablet Take 650 mg by mouth every 6 (six) hours as needed.    [name] Kitchen apixaban (ELIQUIS) 5 MG TABS tablet Take 1 tablet (5 mg total) by mouth 2 (two) times daily. 180 tablet 3  . atorvastatin (LIPITOR) 40 MG tablet Take 40 mg by mouth daily.    . cholecalciferol (VITAMIN D) 1000 UNITS tablet Take 1,000 Units by mouth daily.    . DiphenhydrAMINE HCl (BENADRYL PO) Take by mouth as needed.      . hydrochlorothiazide 25 MG tablet Take 12.5 mg by mouth every other day.     . levothyroxine (SYNTHROID, LEVOTHROID) 125 MCG tablet Take 125 mcg by mouth daily. Every day except Sundays.    . Probiotic Product (PROBIOTIC DAILY PO) Take by mouth daily.    . ranitidine (ZANTAC) 150 MG capsule Take 150 mg by mouth every evening.    . vitamin B-12 (CYANOCOBALAMIN) 1000 MCG tablet Take 1,000 mcg by mouth daily.     No current facility-administered medications for this visit.    PHYSICAL EXAMINATION: ECOG PERFORMANCE STATUS: 0 - Asymptomatic  Filed Vitals:   02/19/15 1300  BP: 140/62  Pulse: 51  Temp: 98.2 F (36.8 C)  Resp: 18   Filed Weights   02/19/15 1300  Weight: 175 lb 6.4 oz (79.561 kg)    GENERAL:alert, no distress and comfortable SKIN: skin color, texture, turgor are normal, no rashes or significant lesions EYES: normal, Conjunctiva are pink and non-injected, sclera clear OROPHARYNX:no exudate, no erythema and lips, buccal mucosa, and tongue normal  NECK: supple, thyroid normal size, non-tender, without nodularity LYMPH:  no palpable lymphadenopathy in the cervical, axillary or inguinal LUNGS: clear to  auscultation and percussion with normal breathing effort HEART: regular rate & rhythm and no murmurs and no lower extremity edema ABDOMEN:abdomen soft, non-tender and normal bowel sounds Musculoskeletal:no cyanosis of digits and no clubbing  NEURO: alert & oriented x 3 with fluent speech, no  focal motor/sensory deficits  LABORATORY DATA:  I have reviewed the data as listed    Component Value Date/Time   NA 140 02/12/2015 1406   NA 138 04/07/2011 1200   K 3.8 02/12/2015 1406   K 3.2* 04/07/2011 1200   CL 100 04/07/2011 1200   CO2 26 02/12/2015 1406   CO2 28 04/07/2011 1200   GLUCOSE 94 02/12/2015 1406   GLUCOSE 103* 04/07/2011 1200   BUN 13.4 02/12/2015 1406   BUN 14 04/07/2011 1200   CREATININE 0.9 02/12/2015 1406   CREATININE 0.89 04/07/2011 1200   CALCIUM 9.8 02/12/2015 1406   CALCIUM 10.4 04/07/2011 1200   PROT 6.7 02/12/2015 1406   ALBUMIN 3.8 02/12/2015 1406   AST 23 02/12/2015 1406   ALT 16 02/12/2015 1406   ALKPHOS 108 02/12/2015 1406   BILITOT 0.77 02/12/2015 1406   GFRNONAA >60 04/07/2011 1200   GFRAA >60 04/07/2011 1200    No results found for: SPEP, UPEP  Lab Results  Component Value Date   WBC 6.2 02/12/2015   NEUTROABS 3.7 02/12/2015   HGB 12.1 02/12/2015   HCT 36.4 02/12/2015   MCV 93.1 02/12/2015   PLT 248 02/12/2015      Chemistry      Component Value Date/Time   NA 140 02/12/2015 1406   NA 138 04/07/2011 1200   K 3.8 02/12/2015 1406   K 3.2* 04/07/2011 1200   CL 100 04/07/2011 1200   CO2 26 02/12/2015 1406   CO2 28 04/07/2011 1200   BUN 13.4 02/12/2015 1406   BUN 14 04/07/2011 1200   CREATININE 0.9 02/12/2015 1406   CREATININE 0.89 04/07/2011 1200      Component Value Date/Time   CALCIUM 9.8 02/12/2015 1406   CALCIUM 10.4 04/07/2011 1200   ALKPHOS 108 02/12/2015 1406   AST 23 02/12/2015 1406   ALT 16 02/12/2015 1406   BILITOT 0.77 02/12/2015 1406     I reviewed the skeletal survey with the patient.  ASSESSMENT & PLAN:  MGUS (monoclonal gammopathy of unknown significance) Blood tests, urine test and skeletal survey are not consistent with multiple myeloma. I recommend yearly visit with history, physical examination and blood work. I discussed with the natural history of multiple myeloma. At present time, she does  not require further investigation or treatment.  The mild abnormality on the skull x-ray is nonspecific. She is not symptomatic and I will observe only.     Orders Placed This Encounter  Procedures  . CBC with Differential/Platelet    Standing Status: Future     Number of Occurrences:      Standing Expiration Date: 03/25/2016  . Comprehensive metabolic panel    Standing Status: Future     Number of Occurrences:      Standing Expiration Date: 03/25/2016  . Lactate dehydrogenase    Standing Status: Future     Number of Occurrences:      Standing Expiration Date: 03/25/2016  . SPEP & IFE with QIG    Standing Status: Future     Number of Occurrences:      Standing Expiration Date: 03/25/2016  . Kappa/lambda light chains    Standing Status: Future     Number of Occurrences:  Standing Expiration Date: 03/25/2016   All questions were answered. The patient knows to call the clinic with any problems, questions or concerns. No barriers to learning was detected. I spent 15 minutes counseling the patient face to face. The total time spent in the appointment was 20 minutes and more than 50% was on counseling and review of test results     [facility], [place], MD 02/19/2015 3:16 PM

## 2015-02-19 NOTE — Assessment & Plan Note (Signed)
Blood tests, urine test and skeletal survey are not consistent with multiple myeloma. I recommend yearly visit with history, physical examination and blood work. I discussed with the natural history of multiple myeloma. At present time, she does not require further investigation or treatment.  The mild abnormality on the skull x-ray is nonspecific. She is not symptomatic and I will observe only.

## 2015-02-20 ENCOUNTER — Telehealth: Payer: Self-pay | Admitting: Hematology and Oncology

## 2015-02-20 NOTE — Telephone Encounter (Signed)
lvm fo rpt regarding to July 2017...mailed pt appt sched/avs and letter

## 2015-03-30 ENCOUNTER — Ambulatory Visit (INDEPENDENT_AMBULATORY_CARE_PROVIDER_SITE_OTHER): Payer: Medicare Other | Admitting: Cardiology

## 2015-03-30 ENCOUNTER — Encounter: Payer: Self-pay | Admitting: Cardiology

## 2015-03-30 VITALS — BP 142/68 | HR 44 | Ht 64.0 in | Wt 178.0 lb

## 2015-03-30 DIAGNOSIS — I480 Paroxysmal atrial fibrillation: Secondary | ICD-10-CM | POA: Diagnosis not present

## 2015-03-30 DIAGNOSIS — R001 Bradycardia, unspecified: Secondary | ICD-10-CM | POA: Diagnosis not present

## 2015-03-30 DIAGNOSIS — I421 Obstructive hypertrophic cardiomyopathy: Secondary | ICD-10-CM | POA: Diagnosis not present

## 2015-03-30 DIAGNOSIS — I10 Essential (primary) hypertension: Secondary | ICD-10-CM | POA: Diagnosis not present

## 2015-03-30 NOTE — Patient Instructions (Signed)
We will have you wear a Holter monitor for 24 hours.  Continue your other therapy

## 2015-03-30 NOTE — Progress Notes (Signed)
Tracey Mcclure Date of Birth: 08/08/1941   History of Present Illness: Tracey Mcclure is seen today for followup of atrial fibrillation adn HOCM. She has a history of hypertrophic obstructive cardiomyopathy, hypertension, and Mobitz type I second-degree AV block. When seen in April she was noted to be in atrial fibrillation with a slow ventricular response. She had only mild palpitations. Verapamil was discontinued. 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 Prior extensive GI work up for heme positive stool and iron deficiency anemia was unrevealing.  On follow up today she reports she is fair. Diffusely positive ROS. Complains of lightheadedness, imbalance, fatigue, hot flashes, and insomnia. States none of these symptoms are new. Still able to mow grass and do yard work but has to stop after 30-60 minutes. Some Afib with rate up to 90s. No syncope.  Current Outpatient Prescriptions on File Prior to Visit  Medication Sig Dispense Refill  . acetaminophen (TYLENOL) 325 MG tablet Take 650 mg by mouth every 6 (six) hours as needed.    [name] Kitchen apixaban (ELIQUIS) 5 MG TABS tablet Take 1 tablet (5 mg total) by mouth 2 (two) times daily. 180 tablet 3  . atorvastatin (LIPITOR) 40 MG tablet Take 40 mg by mouth daily.    . cholecalciferol (VITAMIN D) 1000 UNITS tablet Take 1,000 Units by mouth daily.    . DiphenhydrAMINE HCl (BENADRYL PO) Take by mouth as needed.      . hydrochlorothiazide 25 MG tablet Take 12.5 mg by mouth every other day.     . levothyroxine (SYNTHROID, LEVOTHROID) 125 MCG tablet Take 125 mcg by mouth daily. Every day except Sundays.    . Probiotic Product (PROBIOTIC DAILY PO) Take by mouth daily.    . ranitidine (ZANTAC) 150 MG capsule Take 150 mg by mouth every evening.    . vitamin B-12 (CYANOCOBALAMIN) 1000 MCG tablet Take 1,000 mcg by mouth daily.     No current facility-administered medications on file prior to visit.    No Known Allergies  Past Medical History  Diagnosis  Date  . Hypertrophic obstructive cardiomyopathy   . Mitral insufficiency   . HTN (hypertension)     CONTROLLED  . Palpitations   . Hypercholesteremia   . Hypothyroidism   . GERD (gastroesophageal reflux disease)   . Obesity   . Colon adenoma   . 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  . Iron deficiency anemia, unspecified 10/14/2013  . Gastric mass 10/14/2013  . 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  . Atrial fibrillation 12/05/2013    Past Surgical History  Procedure Laterality Date  . Abdominal hysterectomy    . Ovary surgery      BENIGN TUMOR    History  Smoking status  . Never Smoker   Smokeless tobacco  . Never Used    History  Alcohol Use No    Family History  Problem Relation Age of Onset  . Heart attack Brother 71  . Prostate cancer Brother   . Breast cancer Sister 63    Review of Systems: As noted in history of present illness.All other systems were reviewed and are negative.  Physical Exam: BP 142/68 mmHg  Pulse 44  Ht 5\' 4"  (1.626 m)  Wt 80.74 kg (178 lb)  BMI 30.54 kg/m2 She is a pleasant overweight white female in no acute distress. HEENT exam is unremarkable. Sclerae are clear. Oropharynx is clear. Neck is supple no JVD, adenopathy, thyromegaly, or bruits. Lungs are clear. Cardiac  exam reveals a regular rate and rhythm with a harsh grade 2/6 systolic murmur at the left sternal border. She has no S3. Abdomen is obese, soft, nontender. She has no edema. Pedal pulses are 2+. She is alert and oriented x3. Cranial nerves II through XII are intact.  LABORATORY DATA:   Echo:12/27/13:Study Conclusions  - Left ventricle: E/e&' >10 suggestive of elevated LV filling pressures. There was severe focal basal hypertrophy. Systolic function was normal. The estimated ejection fraction was in the range of 60% to 65%. There was dynamic obstruction during Valsalva in the outflow tract, with a peak velocity of 345  cm/sec and a peak gradient of 48 mm Hg c/w IHSS - Aortic valve: Moderate thickening and calcification, consistent with sclerosis. There was mild regurgitation. - Aorta: Ascending aortic diameter: 40 mm (S). - Ascending aorta: The ascending aorta was mildly dilated. - Mitral valve: Calcified annulus. There was mild systolic anterior motion of the anterior leaflet and chordal structures. There was moderate regurgitation. - Left atrium: The atrium was mildly dilated. - Right ventricle: The cavity size was mildly dilated.  Ecg today shows marked sinus bradycardia with rate 44 bpm. 1st degree AV block. Nonspecific ST T abnormality. LVH. I have personally reviewed and interpreted this study.    Assessment / Plan: 1. Atrial fibrillation- paroxysmal.  History of sinus bradycardia with second-degree Mobitz type I AV block.  For that reason is on no AV nodal blockade. CHAD-Vasc score of 3-4. On Eliquis for anticoagulation. Will have her wear a 24 hr. Holter to make sure a pacemaker is not indicated.   2. Hypertrophic cardiomyopathy. Echocardiogram 2015  was stable from 2013. She is asymptomatic. Not a candidate for beta blocker or verapamil due to bradycardia and AV block.   3. Mitral insufficiency related to her hypertrophic cardiomyopathy. Asymptomatic.  4. Hypertension, controlled.  5. Iron deficiency anemia with heme positive stool. Negative GI work up.  6. MGUS

## 2015-03-31 ENCOUNTER — Ambulatory Visit (INDEPENDENT_AMBULATORY_CARE_PROVIDER_SITE_OTHER): Payer: Medicare Other

## 2015-03-31 DIAGNOSIS — I480 Paroxysmal atrial fibrillation: Secondary | ICD-10-CM | POA: Diagnosis not present

## 2015-03-31 DIAGNOSIS — I10 Essential (primary) hypertension: Secondary | ICD-10-CM

## 2015-03-31 DIAGNOSIS — I421 Obstructive hypertrophic cardiomyopathy: Secondary | ICD-10-CM | POA: Diagnosis not present

## 2015-03-31 DIAGNOSIS — R001 Bradycardia, unspecified: Secondary | ICD-10-CM

## 2015-04-06 ENCOUNTER — Telehealth: Payer: Self-pay | Admitting: Cardiology

## 2015-04-06 NOTE — Telephone Encounter (Signed)
Received call from patient 24 hr monitor results given.

## 2015-04-06 NOTE — Telephone Encounter (Signed)
Pt called in stating that one of the nurse's called her on Friday in regards to some results to her 24 hour heart monitor. Please call her back  Thanks

## 2015-05-04 DIAGNOSIS — Z0100 Encounter for examination of eyes and vision without abnormal findings: Secondary | ICD-10-CM | POA: Diagnosis not present

## 2015-05-04 DIAGNOSIS — Z961 Presence of intraocular lens: Secondary | ICD-10-CM | POA: Diagnosis not present

## 2015-05-04 DIAGNOSIS — H43813 Vitreous degeneration, bilateral: Secondary | ICD-10-CM | POA: Diagnosis not present

## 2015-05-04 DIAGNOSIS — H5310 Unspecified subjective visual disturbances: Secondary | ICD-10-CM | POA: Diagnosis not present

## 2015-06-01 ENCOUNTER — Telehealth: Payer: Self-pay | Admitting: Cardiology

## 2015-06-01 NOTE — Telephone Encounter (Signed)
Pt of Dr. Martinique. Known Hx of A Fib, Mobitz type 1 2nd deg AV block.   Pt states she feels tired but can function. Knows she has been told her pulse shouldn't be below 50.  She explains recently she has been having some dizziness - "a little bit" mainly w/ changing position, getting up/standing up, etc.  She wore a holter monitor recently following OV in August, similar symptoms reported at that time. Monitor results were explained to her after wearing. Since then, she has been keeping log of BPs & HRs.  117/51 hr 40 146/71 hr 41  This am 127/52 hr 40 Cites HRs of 36-39 as recently as this weekend. HR as high as 50-51 recently, but mainly staying in mid-40s.  She cites no urgency w/ this, but wanted advice before next routine OV.  Informed patient I would route to Dr. Claiborne Billings ([facility]). Recommendations will be communicated to patient.

## 2015-06-01 NOTE — Telephone Encounter (Signed)
Tracey Mcclure is calling because she is having a low pulse and some dizziness. Please call   Thanks

## 2015-06-02 NOTE — Telephone Encounter (Signed)
Pt informed of Dr. Doug Sou recommendations - understanding verbalized.

## 2015-06-02 NOTE — Telephone Encounter (Signed)
Holter in August showed low HR 39, mean 66. She had some Mobitz 1 AV block. No indication for pacer at that time. If she is more symptomatic we could have her wear an event monitor to try and correlate symptoms.  Letizia Hook Martinique MD, [facility]

## 2015-06-02 NOTE — Telephone Encounter (Signed)
Routed to Dr. Martinique to advise.

## 2015-06-06 DIAGNOSIS — Z23 Encounter for immunization: Secondary | ICD-10-CM | POA: Diagnosis not present

## 2015-06-29 DIAGNOSIS — I480 Paroxysmal atrial fibrillation: Secondary | ICD-10-CM | POA: Diagnosis not present

## 2015-06-29 DIAGNOSIS — R5383 Other fatigue: Secondary | ICD-10-CM | POA: Diagnosis not present

## 2015-06-29 DIAGNOSIS — D509 Iron deficiency anemia, unspecified: Secondary | ICD-10-CM | POA: Diagnosis not present

## 2015-06-29 DIAGNOSIS — R600 Localized edema: Secondary | ICD-10-CM | POA: Diagnosis not present

## 2015-06-29 DIAGNOSIS — E039 Hypothyroidism, unspecified: Secondary | ICD-10-CM | POA: Diagnosis not present

## 2015-06-29 DIAGNOSIS — R001 Bradycardia, unspecified: Secondary | ICD-10-CM | POA: Diagnosis not present

## 2015-06-30 ENCOUNTER — Telehealth: Payer: Self-pay | Admitting: Physician Assistant

## 2015-06-30 NOTE — Telephone Encounter (Signed)
Received records from [facility] for appointment on 07/10/15 with Rosaria Ferries, PA.  Records given to [employer] (medical records) for Rhonda's schedule on 07/10/15. lp

## 2015-07-10 ENCOUNTER — Ambulatory Visit (INDEPENDENT_AMBULATORY_CARE_PROVIDER_SITE_OTHER): Payer: Medicare Other | Admitting: Physician Assistant

## 2015-07-10 ENCOUNTER — Encounter: Payer: Self-pay | Admitting: Physician Assistant

## 2015-07-10 VITALS — BP 150/80 | HR 43 | Ht 65.0 in | Wt 179.0 lb

## 2015-07-10 DIAGNOSIS — I482 Chronic atrial fibrillation, unspecified: Secondary | ICD-10-CM

## 2015-07-10 DIAGNOSIS — R001 Bradycardia, unspecified: Secondary | ICD-10-CM | POA: Diagnosis not present

## 2015-07-10 DIAGNOSIS — I421 Obstructive hypertrophic cardiomyopathy: Secondary | ICD-10-CM

## 2015-07-10 DIAGNOSIS — R002 Palpitations: Secondary | ICD-10-CM

## 2015-07-10 NOTE — Progress Notes (Signed)
Cardiology Office Note   Date:  07/10/2015   ID:  Tracey Mcclure, DOB [birth year], MRN [id number]  PCP:  Jerlyn Ly, MD  Cardiologist:  Dr Martinique  Damin Salido, PA-C   Chief Complaint  Patient presents with  . Bradycardia  . Shortness of Breath    WITH EXERTION  . Edema    SWELLING AT NIGHT IN ANKLES AND LEGS  . Dizziness    WHEN BENDING OVER TO STAND UP    History of Present Illness: Tracey Mcclure is a 74 y.o. female with a history of HOCM, afib on Eliquis, HTN, Mobitz I and bradycardia on BB/CCB, MGUS, iron def anemia. S/p Holter 03/2015 w/ HR occ 30s (while asleep), short tachycardia (no sx recorded), no indication for PPM   Tracey Mcclure presents for evaluation of bradycardia and requests answers to 6 questions she has written down.  She recently had some lower extremity edema and received a prescription for compression stockings. She has not had these filled because she cannot get them on and off. Since then, the edema has improved. Is it extremely important that she have the compression stockings?  She has dizziness when she bends down or squats and then stands up. This is brief and goes away quickly. All she has to do a stand still for a few seconds and it will resolve.  She wants to know if it is okay for her to go to the [employer]. She has bleacher seats.  She wants to know if there is anything she can do for the edema if she gets it again. An additional question is should she increase her HCTZ.  She was to know if there is anything she needs to do about her heart rate or if she needs to be concerned about it. She has been documenting her heart rate and blood pressure. Occasionally her blood pressure is [id number] systolic, but generally it is between 115 and 140. Her heart rate will frequently be in the 40s and drops into the 30s when she is at rest. Is this a problem?  She does not note any particular symptoms associated with a low heart rate. She does  not feel particularly tired. She does not get dyspnea on exertion or chest pain. She does not get lightheaded while walking. She notes that if she walks across the room and sits down and checks her blood pressure her heart rate will be in the 60s but will drop if she sits still for a few minutes. She has no specific awareness of a low heart rate and no awareness of being in atrial fibrillation.  She has some stressors involved in caring for her son who lives with her. He has stage IV colon cancer. He is currently doing pretty well.  Past Medical History  Diagnosis Date  . Hypertrophic obstructive cardiomyopathy   . Mitral insufficiency   . HTN (hypertension)     CONTROLLED  . Palpitations   . Hypercholesteremia   . Hypothyroidism   . GERD (gastroesophageal reflux disease)   . Obesity   . Colon adenoma   . 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  . Iron deficiency anemia, unspecified 10/14/2013  . Gastric mass 10/14/2013  . 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  . Atrial fibrillation ([place]) 12/05/2013    Past Surgical History  Procedure Laterality Date  . Abdominal hysterectomy    . Ovary surgery      BENIGN TUMOR  Current Outpatient Prescriptions  Medication Sig Dispense Refill  . acetaminophen (TYLENOL) 325 MG tablet Take 650 mg by mouth every 6 (six) hours as needed.    [name] Kitchen apixaban (ELIQUIS) 5 MG TABS tablet Take 1 tablet (5 mg total) by mouth 2 (two) times daily. 180 tablet 3  . atorvastatin (LIPITOR) 40 MG tablet Take 40 mg by mouth daily.    . cholecalciferol (VITAMIN D) 1000 UNITS tablet Take 1,000 Units by mouth daily.    . DiphenhydrAMINE HCl (BENADRYL PO) Take by mouth as needed.      . hydrochlorothiazide 25 MG tablet Take 12.5 mg by mouth every other day.     . levothyroxine (SYNTHROID, LEVOTHROID) 125 MCG tablet Take 125 mcg by mouth daily. Every day except Sundays.    . Probiotic Product (PROBIOTIC DAILY PO) Take by mouth  daily.    . ranitidine (ZANTAC) 150 MG capsule Take 150 mg by mouth every evening.    . vitamin B-12 (CYANOCOBALAMIN) 1000 MCG tablet Take 1,000 mcg by mouth daily.     No current facility-administered medications for this visit.    Allergies:   Review of patient's allergies indicates no known allergies.    Social History:  The patient  reports that she has never smoked. She has never used smokeless tobacco. She reports that she does not drink alcohol or use illicit drugs.   Family History:  The patient's family history includes Breast cancer (age of onset: 110) in her sister; Heart attack (age of onset: 69) in her brother; Prostate cancer in her brother.    ROS:  Please see the history of present illness. All other systems are reviewed and negative.    PHYSICAL EXAM: VS:  BP 150/80 mmHg  Pulse 43  Ht 5\' 5"  (1.651 m)  Wt 179 lb (81.194 kg)  BMI 29.79 kg/m2  SpO2 97%  LMP  (LMP Unknown) , BMI Body mass index is 29.79 kg/(m^2). GEN: Well nourished, well developed, female in no acute distress HEENT: normal for age  Neck: no JVD, no carotid bruit, no masses Cardiac: Irregular rate and rhythm; 3/6 murmur, no rubs, or gallops Respiratory:  clear to auscultation bilaterally, normal work of breathing GI: soft, nontender, nondistended, + BS MS: no deformity or atrophy; no edema; distal pulses are 2+ in all 4 extremities  Skin: warm and dry, no rash Neuro:  Strength and sensation are intact Psych: euthymic mood, full affect   EKG:  EKG is ordered today. The ekg ordered today demonstrates atrial fibrillation, slow ventricular response with a heart rate of 43. No new ischemic changes noted   Recent Labs: 02/12/2015: ALT 16; BUN 13.4; Creatinine 0.9; HGB 12.1; Platelets 248; Potassium 3.8; Sodium 140    Lipid Panel No results found for: CHOL, TRIG, HDL, CHOLHDL, VLDL, LDLCALC, LDLDIRECT   Wt Readings from Last 3 Encounters:  07/10/15 179 lb (81.194 kg)  03/30/15 178 lb (80.74 kg)    02/19/15 175 lb 6.4 oz (79.561 kg)     Other studies Reviewed: Additional studies/ records that were reviewed today include: Previous office notes, echo and ECGs.  ASSESSMENT AND PLAN:  1.  Bradycardia: I discussed symptomatic bradycardia with her. She does not have any particular symptoms associated with a low heart rate. She is aware of the need to watch her rate and contact us as soon as possible if she begins to get symptoms or HR starts dropping to less than 35. She was reassured that she is not on any medications  that would lower her heart rate.  2. HOCM: Her murmur is pronounced. Her last echocardiogram was a year and a half ago. She will get one in January and follow-up with Dr. Martinique at that time.  3. Hypertension: Her blood pressure is generally fairly well controlled. No medication changes at this time but she is encouraged to continue tracking.  4. Atrial fibrillation: She has no palpitations. She was ambulated in the office and her heart rate increased to over 80 with activity. She was not short of breath with this.  5. Chronic anticoagulation-Eliquis, CHADS2VASC=3: She is tolerating the Eliquis well, no significant bleeding issues  6. Edema: The edema has improved. I discussed the fact that her dizziness may get worse if she tries to increase the HCTZ. As the edema has improved, she will emphasize dietary compliance and keep her HCTZ at the same dose. (The dose was previously decreased because of dizziness)   Current medicines are reviewed at length with the patient today.  The patient has concerns regarding medicines. Concerns were addressed  The following changes have been made:  no change  Labs/ tests ordered today include:   Orders Placed This Encounter  Procedures  . EKG 12-Lead  . Echocardiogram     Disposition:   FU with Dr. Martinique and get an echocardiogram in January  Signed, Brad Lieurance, Hershal Coria  07/10/2015 3:28 PM    [address], [place], [place]  [address] Phone: [phone]; Fax: [phone]

## 2015-07-10 NOTE — Patient Instructions (Addendum)
Medication Instructions:   Your physician recommends that you continue on your current medications as directed. Please refer to the Current Medication list given to you today.    If you need a refill on your cardiac medications before your next appointment, please call your pharmacy.  Labwork: NONE ORDER TODAY    Testing/Procedures:Your physician has requested that you have an echocardiogram. Echocardiography is a painless test that uses sound waves to create images of your heart. It provides your doctor with information about the size and shape of your heart and how well your heart's chambers and valves are working. This procedure takes approximately one hour. There are no restrictions for this procedure.   Follow-Up: in January with  DR Martinique    Any Other Special Instructions Will Be Listed Below (If Applicable).

## 2015-08-17 ENCOUNTER — Ambulatory Visit (HOSPITAL_COMMUNITY): Payer: Medicare Other | Attending: Cardiology

## 2015-08-17 DIAGNOSIS — I10 Essential (primary) hypertension: Secondary | ICD-10-CM | POA: Insufficient documentation

## 2015-08-17 DIAGNOSIS — Z6829 Body mass index (BMI) 29.0-29.9, adult: Secondary | ICD-10-CM | POA: Insufficient documentation

## 2015-08-17 DIAGNOSIS — I421 Obstructive hypertrophic cardiomyopathy: Secondary | ICD-10-CM

## 2015-08-17 DIAGNOSIS — I358 Other nonrheumatic aortic valve disorders: Secondary | ICD-10-CM | POA: Insufficient documentation

## 2015-08-17 DIAGNOSIS — I517 Cardiomegaly: Secondary | ICD-10-CM | POA: Diagnosis not present

## 2015-08-17 DIAGNOSIS — E669 Obesity, unspecified: Secondary | ICD-10-CM | POA: Diagnosis not present

## 2015-08-17 DIAGNOSIS — I340 Nonrheumatic mitral (valve) insufficiency: Secondary | ICD-10-CM | POA: Insufficient documentation

## 2015-08-17 DIAGNOSIS — E7800 Pure hypercholesterolemia, unspecified: Secondary | ICD-10-CM | POA: Insufficient documentation

## 2015-08-17 DIAGNOSIS — I4892 Unspecified atrial flutter: Secondary | ICD-10-CM | POA: Insufficient documentation

## 2015-08-17 DIAGNOSIS — I071 Rheumatic tricuspid insufficiency: Secondary | ICD-10-CM | POA: Insufficient documentation

## 2015-08-17 DIAGNOSIS — R001 Bradycardia, unspecified: Secondary | ICD-10-CM | POA: Diagnosis not present

## 2015-08-24 ENCOUNTER — Telehealth: Payer: Self-pay | Admitting: Cardiology

## 2015-08-24 NOTE — Telephone Encounter (Signed)
Returned call to patient no answer.Left message on personal voice mail echo results not available at present.

## 2015-08-24 NOTE — Telephone Encounter (Signed)
Pt called in stating that she had an Echo done on 1/9 and she wanted to know what the results were. Please f/u with her   Thanks

## 2015-09-22 ENCOUNTER — Ambulatory Visit (INDEPENDENT_AMBULATORY_CARE_PROVIDER_SITE_OTHER): Payer: Medicare Other | Admitting: Cardiology

## 2015-09-22 ENCOUNTER — Encounter: Payer: Self-pay | Admitting: Cardiology

## 2015-09-22 VITALS — BP 154/74 | HR 58 | Ht 65.0 in | Wt 178.2 lb

## 2015-09-22 DIAGNOSIS — I441 Atrioventricular block, second degree: Secondary | ICD-10-CM

## 2015-09-22 DIAGNOSIS — I10 Essential (primary) hypertension: Secondary | ICD-10-CM

## 2015-09-22 DIAGNOSIS — I421 Obstructive hypertrophic cardiomyopathy: Secondary | ICD-10-CM | POA: Diagnosis not present

## 2015-09-22 DIAGNOSIS — I482 Chronic atrial fibrillation, unspecified: Secondary | ICD-10-CM

## 2015-09-22 NOTE — Progress Notes (Signed)
[name] Date of Birth: 16-Sep-1940   History of Present Illness: [name] is seen today for followup of atrial fibrillation and bradycardia. She has a history of hypertrophic obstructive cardiomyopathy, hypertension, and Mobitz type I second-degree AV block. When seen in April 2015 she was noted to be in atrial fibrillation with a slow ventricular response of 53. She had only mild palpitations. Verapamil was discontinued. 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 Prior extensive GI work up for heme positive stool and iron deficiency anemia was unrevealing.  She had a Holter monitor in august 2016 that showed sinus rhythm with PACs and PVCs. Some intermittent second degree AV block. Mean HR 65 with slowest HR 31 and longest pause of 3.1 seconds. She was seen by Rosaria Ferries PA. At that time Ecg showed AFib with rate 43 bpm. She complained of some intermittent edema. She does feel tired but only gets dizzy when bending over. She is able to do light house work and gardening without dyspnea or chest pain. She is under a lot of stress with her son having stage 4 colon CA. She keeps a diary of her BP and HR. BP is good. HR runs between 38-50.   Current Outpatient Prescriptions on File Prior to Visit  Medication Sig Dispense Refill  . acetaminophen (TYLENOL) 325 MG tablet Take 650 mg by mouth every 6 (six) hours as needed.    [name] Kitchen apixaban (ELIQUIS) 5 MG TABS tablet Take 1 tablet (5 mg total) by mouth 2 (two) times daily. 180 tablet 3  . atorvastatin (LIPITOR) 40 MG tablet Take 40 mg by mouth daily.    . cholecalciferol (VITAMIN D) 1000 UNITS tablet Take 1,000 Units by mouth daily.    . DiphenhydrAMINE HCl (BENADRYL PO) Take by mouth as needed.      . hydrochlorothiazide 25 MG tablet Take 12.5 mg by mouth every other day.     . levothyroxine (SYNTHROID, LEVOTHROID) 125 MCG tablet Take 125 mcg by mouth daily. Every day except Sundays.    . Probiotic Product (PROBIOTIC DAILY PO) Take by mouth  daily.    . ranitidine (ZANTAC) 150 MG capsule Take 150 mg by mouth every evening.    . vitamin B-12 (CYANOCOBALAMIN) 1000 MCG tablet Take 1,000 mcg by mouth daily.     No current facility-administered medications on file prior to visit.    No Known Allergies  Past Medical History  Diagnosis Date  . Hypertrophic obstructive cardiomyopathy   . Mitral insufficiency   . HTN (hypertension)     CONTROLLED  . Palpitations   . Hypercholesteremia   . Hypothyroidism   . GERD (gastroesophageal reflux disease)   . Obesity   . Colon adenoma   . 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  . Iron deficiency anemia, unspecified 10/14/2013  . Gastric mass 10/14/2013  . 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  . Atrial fibrillation ([place]) 12/05/2013    Past Surgical History  Procedure Laterality Date  . Abdominal hysterectomy    . Ovary surgery      BENIGN TUMOR    History  Smoking status  . Never Smoker   Smokeless tobacco  . Never Used    History  Alcohol Use No    Family History  Problem Relation Age of Onset  . Heart attack Brother 35  . Prostate cancer Brother   . Breast cancer Sister 75    Review of Systems: As noted in history of present  illness.All other systems were reviewed and are negative.  Physical Exam: BP 154/74 mmHg  Pulse 58  Ht 5\' 5"  (1.651 m)  Wt 80.831 kg (178 lb 3.2 oz)  BMI 29.65 kg/m2  LMP  (LMP Unknown) She is a pleasant overweight white female in no acute distress. HEENT exam is unremarkable. Sclerae are clear. Oropharynx is clear. Neck is supple no JVD, adenopathy, thyromegaly, or bruits. Lungs are clear. Cardiac exam reveals a regular rate and rhythm with a harsh grade 2/6 systolic murmur at the left sternal border. She has no S3. Abdomen is obese, soft, nontender. She has no edema. Pedal pulses are 2+. She is alert and oriented x3. Cranial nerves II through XII are intact.  LABORATORY  DATA:   Echo:08/17/15:Study Conclusions  Study Conclusions  - Left ventricle: The cavity size was normal. There is severe basal septal hypertrophy consistent with HCM and LVOT gradient at rest 30 mmHg. Systolic function was normal. Wall motion was normal; there were no regional wall motion abnormalities. - Aortic valve: Trileaflet; moderately thickened, moderately calcified leaflets. There was no regurgitation. - Aortic root: The aortic root was normal in size. - Mitral valve: Structurally normal valve. There was mild regurgitation. - Left atrium: The atrium was severely dilated. - Right ventricle: Systolic function was normal. - Tricuspid valve: There was mild regurgitation. - Pulmonic valve: There was no regurgitation. - Pulmonary arteries: Systolic pressure was within the normal range. PA peak pressure: 33 mm Hg (S). - Inferior vena cava: The vessel was normal in size.  Impressions:  - Findings consistent with hypertrophic cardiomyopathy. The patient was in atrial flutter during the acquisition.    Assessment / Plan: 1. Atrial fibrillation- paroxysmal- with slow ventricular response.  History of sinus bradycardia with second-degree Mobitz type I AV block.  She is on no AV nodal blockade. CHAD-Vasc score of 3-4. On Eliquis for anticoagulation. It is difficult to tell if she is symptomatic. She does have some fatigue but no clear dizziness. I would not be so concerned if she was in NSR but having this slow a HR in Afib indicates to me that she has advanced conduction system disease. I would like her to be evaluated by EP to see if a pacemaker is now indicated.   2. Hypertrophic cardiomyopathy. Echocardiogram in January 2016 is unchanged from 2013 and 2015.  She is asymptomatic. Not a candidate for beta blocker or verapamil due to bradycardia and AV block.   3. 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 Asymptomatic.  4. Hypertension,  controlled.  5. Iron deficiency anemia with heme positive stool. Negative GI work up in past.  6. MGUS

## 2015-09-22 NOTE — Patient Instructions (Signed)
We will schedule you to see our Electrophysiologist for consideration of a pacemaker.  I will see you in 3 months.

## 2015-09-25 ENCOUNTER — Institutional Professional Consult (permissible substitution): Payer: Medicare Other | Admitting: Internal Medicine

## 2015-10-01 ENCOUNTER — Ambulatory Visit (INDEPENDENT_AMBULATORY_CARE_PROVIDER_SITE_OTHER): Payer: Medicare Other | Admitting: Internal Medicine

## 2015-10-01 ENCOUNTER — Encounter: Payer: Self-pay | Admitting: Internal Medicine

## 2015-10-01 VITALS — BP 124/72 | HR 43 | Ht 65.0 in | Wt 179.0 lb

## 2015-10-01 DIAGNOSIS — I482 Chronic atrial fibrillation, unspecified: Secondary | ICD-10-CM

## 2015-10-01 DIAGNOSIS — I421 Obstructive hypertrophic cardiomyopathy: Secondary | ICD-10-CM | POA: Diagnosis not present

## 2015-10-01 NOTE — Patient Instructions (Addendum)
Medication Instructions:  Your physician recommends that you continue on your current medications as directed. Please refer to the Current Medication list given to you today.   Labwork: None Ordered   Testing/Procedures: Your physician has recommended that you have a pacemaker inserted. A pacemaker is a small device that is placed under the skin of your chest or abdomen to help control abnormal heart rhythms. This device uses electrical pulses to prompt the heart to beat at a normal rate. Pacemakers are used to treat heart rhythms that are too slow. Wire (leads) are attached to the pacemaker that goes into the chambers of you heart. This is done in the hospital and usually requires and overnight stay. Please see the instruction sheet given to you today for more information.   These are the dates in May when Dr. Lovena Le would be available to put in a pacemaker: March 6, 8, 15, 16, 20, 21, 27, 28  Pacemaker Implantation The heart has its own electrical system, or natural pacemaker, to regulate the heartbeat. Sometimes, the natural pacemaker system of the heart fails and causes the heart to beat too slowly. If this happens, a pacemaker can be surgically placed to help the heart beat at a normal or programmed rate. A pacemaker is a small, battery-powered device that is placed under the skin and is programmed to sense your heartbeats. If your heart rate is lower than the programmed rate, the pacemaker will pace your heart. Parts of a pacemaker include:  Wires or leads. The leads are placed in the heart and transmit electricity to the heart. The leads are connected to the pulse generator.  Pulse generator. The pulse generator contains a computer and a memory system. The pulse generator also produces the electrical signal that triggers the heart to beat. A pacemaker may be placed if:  You have a slow heartbeat (bradycardia).  You have fainting (syncope).  Shortness of breath (dyspnea) due to heart  problems. LET [facility] CARE PROVIDER KNOW ABOUT:  Any allergies you may have.  All medicines you are taking, including vitamins, herbs, eye drops, creams, and over-the-counter medicines.  Previous problems you or members of your family have had with the use of anesthetics.  Any blood disorders you have.  Previous surgeries you have had.  Medical conditions you have.  Possibility of pregnancy, if this applies. RISKS AND COMPLICATIONS Generally, pacemaker implantation is a safe procedure. However, problems can occur and include:  Bleeding.  Unable to place the pacemaker under local sedation.  Infection. BEFORE THE PROCEDURE  You will have blood work drawn before the procedure.  Do not use any tobacco products including cigarettes, chewing tobacco, or electronic cigarettes. If you need help quitting, ask your health care provider.  Do not eat or drink anything after midnight on the night before the procedure or as directed by your health care provider.  Ask your health care provider about:  Changing or stopping your regular medicines. This is especially important if you are taking diabetes medicines or blood thinners.  Taking medicines such as aspirin and ibuprofen. These medicines can thin your blood. Do not take these medicines before your procedure if your health care provider asks you not to.  Ask your health care provider if you can take a sip of water with any approved medicines the morning of the procedure. PROCEDURE  The surgery to place a pacemaker is considered a minimally invasive surgical procedure. It is done under a local anesthetic, which is an injection at  the incision site that makes the skin numb. You are also given sedation and pain medicine that makes you drowsy during the procedure.   An intravenous line (IV) will be started in your hand or arm so sedation and pain medicine can be given during the pacemaker procedure.  A numbing medicine will be  injected into the skin where the pacemaker is to be placed. A small incision will then be made into the skin. The pacemaker is usually placed under the skin near the collarbone.  After the incision has been made, the leads will be inserted into a large vein and guided into the heart using X-ray.  Using the same incision that was used to place the leads, a small pocket will be created under the skin to hold the pulse generator. The leads will then be connected to the pulse generator.  The incision site will then be closed. A bandage (dressing) is placed over the pacemaker site. The dressing is removed 24-48 hours afterward. AFTER THE PROCEDURE  You will be taken to a recovery area after the pacemaker implant. Your vital signs such as blood pressure, heart rate, breathing, and oxygen levels will be monitored.  A chest X-ray will be done after the pacemaker has been implanted. This is to make sure the pacemaker and leads are in the correct place.   This information is not intended to replace advice given to you by your health care provider. Make sure you discuss any questions you have with your health care provider.   Document Released: 07/15/2002 Document Revised: 08/15/2014 Document Reviewed: 11/29/2011 Elsevier Interactive Patient Education 2016 [place]: Your physician recommends that you schedule a follow-up appointment in: Call our office [phone] and ask for Dr. Tanna Furry nurse, Janan Halter, RN, to schedule your procedure   If you need a refill on your cardiac medications before your next appointment, please call your pharmacy.

## 2015-10-01 NOTE — Progress Notes (Signed)
HPI Tracey Mcclure is referred today by Dr. Martinique to consider PPM insertion. She is a pleasant 75yo woman with a h/o hypertrophic cardiomyopathy, chronic atrial fibrillation and symptomatic bradycardia, now with high grade heart block (mostly complete). She gets dizzy a lot but has not had frank syncope. No other complaints today. She has undergone exercise testing and notes that her HR got up to 80/min. If she walks slowly she is ok on flat ground but does not do well when she is in a hurry. No Known Allergies   Current Outpatient Prescriptions  Medication Sig Dispense Refill  . acetaminophen (TYLENOL) 325 MG tablet Take 650 mg by mouth every 6 (six) hours as needed.    [name] Kitchen apixaban (ELIQUIS) 5 MG TABS tablet Take 1 tablet (5 mg total) by mouth 2 (two) times daily. 180 tablet 3  . atorvastatin (LIPITOR) 40 MG tablet Take 40 mg by mouth daily.    . cholecalciferol (VITAMIN D) 1000 UNITS tablet Take 1,000 Units by mouth daily.    . DiphenhydrAMINE HCl (BENADRYL PO) Take by mouth as needed.      . hydrochlorothiazide 25 MG tablet Take 12.5 mg by mouth every other day.     . levothyroxine (SYNTHROID, LEVOTHROID) 125 MCG tablet Take 125 mcg by mouth daily. Every day except Sundays.    . Probiotic Product (PROBIOTIC DAILY PO) Take by mouth daily.    . ranitidine (ZANTAC) 150 MG capsule Take 150 mg by mouth every evening.    . vitamin B-12 (CYANOCOBALAMIN) 1000 MCG tablet Take 1,000 mcg by mouth daily.     No current facility-administered medications for this visit.     Past Medical History  Diagnosis Date  . Hypertrophic obstructive cardiomyopathy   . Mitral insufficiency   . HTN (hypertension)     CONTROLLED  . Palpitations   . Hypercholesteremia   . Hypothyroidism   . GERD (gastroesophageal reflux disease)   . Obesity   . Colon adenoma   . 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  . Iron deficiency anemia, unspecified 10/14/2013  . Gastric mass 10/14/2013  .  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  . Atrial fibrillation ([place]) 12/05/2013    ROS:   All systems reviewed and negative except as noted in the HPI.   Past Surgical History  Procedure Laterality Date  . Abdominal hysterectomy    . Ovary surgery      BENIGN TUMOR     Family History  Problem Relation Age of Onset  . Heart attack Brother 58  . Prostate cancer Brother   . Breast cancer Sister 71     Social History   Social History  . Marital Status: Widowed    Spouse Name: N/A  . Number of Children: 1  . Years of Education: N/A   Occupational History  . Art gallery manager     retired   Social History Main Topics  . Smoking status: Never Smoker   . Smokeless tobacco: Never Used  . Alcohol Use: No  . Drug Use: No  . Sexual Activity: Not on file   Other Topics Concern  . Not on file   Social History Narrative     BP 124/72 mmHg  Pulse 43  Ht 5\' 5"  (1.651 m)  Wt 179 lb (81.194 kg)  BMI 29.79 kg/m2  LMP  (LMP Unknown)  Physical Exam:  Well appearing NAD HEENT: Unremarkable Neck:  No JVD, no thyromegally Lymphatics:  No  adenopathy Back:  No CVA tenderness Lungs:  Clear with no wheezes HEART:  Regular brady rate and rhythm, no murmurs, no rubs, no clicks Abd:  soft, positive bowel sounds, no organomegally, no rebound, no guarding Ext:  2 plus pulses, no edema, no cyanosis, no clubbing Skin:  No rashes no nodules Neuro:  CN II through XII intact, motor grossly intact  EKG - atrial fib with a slow and regular ventricular response    Assess/Plan: 1. 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 2. Atrial fibrillation - her ventricular rate is controlled. As she is not particularly symptomatic, I am not sure we should even consider rhythm control. I will speak to Dr. Martinique. 3. HTN - her blood pressure is well controlled on low  dose diuretic therapy. Once her PPM is in, might consider switching to a beta blocker in the setting of HCM. 4. Dyslipidemia - she will continue lipitor  Gregg Taylor,M.D.

## 2015-10-05 ENCOUNTER — Telehealth: Payer: Self-pay | Admitting: Internal Medicine

## 2015-10-05 DIAGNOSIS — R001 Bradycardia, unspecified: Secondary | ICD-10-CM

## 2015-10-05 NOTE — Telephone Encounter (Signed)
New message   Pt is calling to schedule a pacer maker appt

## 2015-10-05 NOTE — Telephone Encounter (Signed)
I have left a message for the patient with the dates that are now open for March.  I have asked that she call me back to give date that is good for her and I will schedule.

## 2015-10-06 NOTE — Telephone Encounter (Signed)
Pt wants to know if March 8th or March 20th is available.Please let her know which one can be scheduled.

## 2015-10-07 NOTE — Telephone Encounter (Signed)
Follow up       Pt states march 8th is patient's first choice for procedure.  If not available, march 20th.  Pt request a callback to confirm these dates

## 2015-10-07 NOTE — Telephone Encounter (Signed)
PPM implant scheduled for 10/26/15 at 7:30am.  Labs on 10/19/15 at 10am

## 2015-10-13 ENCOUNTER — Encounter: Payer: Self-pay | Admitting: *Deleted

## 2015-10-15 ENCOUNTER — Other Ambulatory Visit: Payer: Self-pay | Admitting: Pharmacist Clinician (PhC)/ Clinical Pharmacy Specialist

## 2015-10-15 MED ORDER — APIXABAN 5 MG PO TABS: 5.0000 mg | ORAL_TABLET | Freq: Two times a day (BID) | ORAL | Status: DC

## 2015-10-19 ENCOUNTER — Other Ambulatory Visit (INDEPENDENT_AMBULATORY_CARE_PROVIDER_SITE_OTHER): Payer: Medicare Other | Admitting: *Deleted

## 2015-10-19 ENCOUNTER — Telehealth: Payer: Self-pay | Admitting: Internal Medicine

## 2015-10-19 DIAGNOSIS — R001 Bradycardia, unspecified: Secondary | ICD-10-CM

## 2015-10-19 DIAGNOSIS — I498 Other specified cardiac arrhythmias: Secondary | ICD-10-CM | POA: Diagnosis not present

## 2015-10-19 LAB — BASIC METABOLIC PANEL
BUN: 12 mg/dL (ref 7–25)
CHLORIDE: 106 mmol/L (ref 98–110)
CO2: 25 mmol/L (ref 20–31)
Calcium: 9.1 mg/dL (ref 8.6–10.4)
Creat: 0.81 mg/dL (ref 0.60–0.93)
Glucose, Bld: 91 mg/dL (ref 65–99)
POTASSIUM: 3.9 mmol/L (ref 3.5–5.3)
SODIUM: 140 mmol/L (ref 135–146)

## 2015-10-19 LAB — CBC WITH DIFFERENTIAL/PLATELET
Basophils Absolute: 0.1 10*3/uL (ref 0.0–0.1)
Basophils Relative: 1 % (ref 0–1)
EOS PCT: 2 % (ref 0–5)
Eosinophils Absolute: 0.1 10*3/uL (ref 0.0–0.7)
HCT: 37.8 % (ref 36.0–46.0)
HEMOGLOBIN: 12.8 g/dL (ref 12.0–15.0)
LYMPHS PCT: 36 % (ref 12–46)
Lymphs Abs: 1.9 10*3/uL (ref 0.7–4.0)
MCH: 31.4 pg (ref 26.0–34.0)
MCHC: 33.9 g/dL (ref 30.0–36.0)
MCV: 92.9 fL (ref 78.0–100.0)
MPV: 10.1 fL (ref 8.6–12.4)
Monocytes Absolute: 0.4 10*3/uL (ref 0.1–1.0)
Monocytes Relative: 7 % (ref 3–12)
NEUTROS PCT: 54 % (ref 43–77)
Neutro Abs: 2.9 10*3/uL (ref 1.7–7.7)
Platelets: 284 10*3/uL (ref 150–400)
RBC: 4.07 MIL/uL (ref 3.87–5.11)
RDW: 14 % (ref 11.5–15.5)
WBC: 5.3 10*3/uL (ref 4.0–10.5)

## 2015-10-19 NOTE — Telephone Encounter (Signed)
New Message:  Please call,needs to ask some questions. She is having her pacemaker put in on 10-26-15.

## 2015-10-19 NOTE — Telephone Encounter (Signed)
Remind him the MRI compatible device.  Dr Lovena Le aware, and I have let the patient know she will have time to speak with Dr Lovena Le before procedure.

## 2015-10-25 MED ORDER — SODIUM CHLORIDE 0.9 % IR SOLN
80.0000 mg | Status: AC
  Administered 2015-10-26: 80 mg
  Filled 2015-10-25: qty 2

## 2015-10-26 ENCOUNTER — Ambulatory Visit (HOSPITAL_COMMUNITY)
Admission: RE | Admit: 2015-10-26 | Discharge: 2015-10-27 | Disposition: A | Discharge status: Home / Self Care | Payer: Medicare Other | Source: Ambulatory Visit | Attending: Internal Medicine | Admitting: Internal Medicine

## 2015-10-26 ENCOUNTER — Encounter (HOSPITAL_COMMUNITY)
Admission: RE | Disposition: A | Discharge status: Home / Self Care | Payer: Self-pay | Source: Ambulatory Visit | Attending: Internal Medicine

## 2015-10-26 ENCOUNTER — Other Ambulatory Visit: Payer: Medicare Other

## 2015-10-26 ENCOUNTER — Encounter (HOSPITAL_COMMUNITY): Payer: Self-pay

## 2015-10-26 DIAGNOSIS — I10 Essential (primary) hypertension: Secondary | ICD-10-CM | POA: Diagnosis not present

## 2015-10-26 DIAGNOSIS — I482 Chronic atrial fibrillation: Secondary | ICD-10-CM | POA: Diagnosis not present

## 2015-10-26 DIAGNOSIS — K219 Gastro-esophageal reflux disease without esophagitis: Secondary | ICD-10-CM | POA: Diagnosis not present

## 2015-10-26 DIAGNOSIS — Z79899 Other long term (current) drug therapy: Secondary | ICD-10-CM | POA: Diagnosis not present

## 2015-10-26 DIAGNOSIS — I442 Atrioventricular block, complete: Secondary | ICD-10-CM | POA: Diagnosis not present

## 2015-10-26 DIAGNOSIS — Z6829 Body mass index (BMI) 29.0-29.9, adult: Secondary | ICD-10-CM | POA: Diagnosis not present

## 2015-10-26 DIAGNOSIS — I4891 Unspecified atrial fibrillation: Secondary | ICD-10-CM | POA: Diagnosis present

## 2015-10-26 DIAGNOSIS — E669 Obesity, unspecified: Secondary | ICD-10-CM | POA: Diagnosis not present

## 2015-10-26 DIAGNOSIS — Z7901 Long term (current) use of anticoagulants: Secondary | ICD-10-CM | POA: Diagnosis not present

## 2015-10-26 DIAGNOSIS — E785 Hyperlipidemia, unspecified: Secondary | ICD-10-CM | POA: Diagnosis not present

## 2015-10-26 DIAGNOSIS — I421 Obstructive hypertrophic cardiomyopathy: Secondary | ICD-10-CM | POA: Insufficient documentation

## 2015-10-26 DIAGNOSIS — E039 Hypothyroidism, unspecified: Secondary | ICD-10-CM | POA: Diagnosis not present

## 2015-10-26 DIAGNOSIS — R001 Bradycardia, unspecified: Secondary | ICD-10-CM | POA: Diagnosis present

## 2015-10-26 DIAGNOSIS — Z950 Presence of cardiac pacemaker: Secondary | ICD-10-CM

## 2015-10-26 HISTORY — PX: EP IMPLANTABLE DEVICE: SHX172B

## 2015-10-26 LAB — SURGICAL PCR SCREEN
MRSA, PCR: NEGATIVE
STAPHYLOCOCCUS AUREUS: NEGATIVE

## 2015-10-26 SURGERY — PACEMAKER IMPLANT

## 2015-10-26 MED ORDER — FENTANYL CITRATE (PF) 100 MCG/2ML IJ SOLN
INTRAMUSCULAR | Status: DC | PRN
  Administered 2015-10-26 (×3): 12.5 ug via INTRAVENOUS

## 2015-10-26 MED ORDER — VITAMIN D 1000 UNITS PO TABS
1000.0000 [IU] | ORAL_TABLET | Freq: Every day | ORAL | Status: DC
  Administered 2015-10-26: 1000 [IU] via ORAL
  Filled 2015-10-26: qty 1

## 2015-10-26 MED ORDER — HEPARIN (PORCINE) IN NACL 2-0.9 UNIT/ML-% IJ SOLN
INTRAMUSCULAR | Status: DC | PRN
  Administered 2015-10-26: 500 mL

## 2015-10-26 MED ORDER — HYDROCHLOROTHIAZIDE 25 MG PO TABS: 12.5000 mg | ORAL_TABLET | ORAL | Status: DC

## 2015-10-26 MED ORDER — ACETAMINOPHEN 325 MG PO TABS
650.0000 mg | ORAL_TABLET | Freq: Four times a day (QID) | ORAL | Status: DC | PRN
  Filled 2015-10-26 (×2): qty 2

## 2015-10-26 MED ORDER — IOPAMIDOL (ISOVUE-370) INJECTION 76%
INTRAVENOUS | Status: DC | PRN
  Administered 2015-10-26: 10 mL via INTRAVENOUS

## 2015-10-26 MED ORDER — MUPIROCIN 2 % EX OINT
1.0000 "application " | TOPICAL_OINTMENT | Freq: Once | CUTANEOUS | Status: DC
  Filled 2015-10-26: qty 22

## 2015-10-26 MED ORDER — MIDAZOLAM HCL 5 MG/5ML IJ SOLN
INTRAMUSCULAR | Status: DC | PRN
  Administered 2015-10-26 (×3): 1 mg via INTRAVENOUS

## 2015-10-26 MED ORDER — MIDAZOLAM HCL 5 MG/5ML IJ SOLN
INTRAMUSCULAR | Status: AC
  Filled 2015-10-26: qty 5

## 2015-10-26 MED ORDER — HEPARIN (PORCINE) IN NACL 2-0.9 UNIT/ML-% IJ SOLN
INTRAMUSCULAR | Status: AC
  Filled 2015-10-26: qty 500

## 2015-10-26 MED ORDER — FENTANYL CITRATE (PF) 100 MCG/2ML IJ SOLN
INTRAMUSCULAR | Status: AC
  Filled 2015-10-26: qty 2

## 2015-10-26 MED ORDER — LIDOCAINE HCL (PF) 1 % IJ SOLN
INTRAMUSCULAR | Status: AC
  Filled 2015-10-26: qty 60

## 2015-10-26 MED ORDER — CEFAZOLIN SODIUM 1-5 GM-% IV SOLN
1.0000 g | Freq: Four times a day (QID) | INTRAVENOUS | Status: AC
  Administered 2015-10-26 – 2015-10-27 (×3): 1 g via INTRAVENOUS
  Filled 2015-10-26 (×4): qty 50

## 2015-10-26 MED ORDER — ONDANSETRON HCL 4 MG/2ML IJ SOLN: 4.0000 mg | Freq: Four times a day (QID) | INTRAMUSCULAR | Status: DC | PRN

## 2015-10-26 MED ORDER — IOPAMIDOL (ISOVUE-370) INJECTION 76%
INTRAVENOUS | Status: AC
  Filled 2015-10-26: qty 50

## 2015-10-26 MED ORDER — CEFAZOLIN SODIUM-DEXTROSE 2-3 GM-% IV SOLR
2.0000 g | INTRAVENOUS | Status: AC
  Administered 2015-10-26: 2 g via INTRAVENOUS

## 2015-10-26 MED ORDER — SODIUM CHLORIDE 0.9 % IV SOLN
INTRAVENOUS | Status: DC | PRN
  Administered 2015-10-26: 50 mL/h via INTRAVENOUS

## 2015-10-26 MED ORDER — ATORVASTATIN CALCIUM 40 MG PO TABS
40.0000 mg | ORAL_TABLET | Freq: Every day | ORAL | Status: DC
  Administered 2015-10-26: 40 mg via ORAL
  Filled 2015-10-26: qty 1

## 2015-10-26 MED ORDER — VITAMIN B-12 1000 MCG PO TABS
1000.0000 ug | ORAL_TABLET | Freq: Every day | ORAL | Status: DC
  Administered 2015-10-26: 1000 ug via ORAL
  Filled 2015-10-26: qty 1

## 2015-10-26 MED ORDER — SODIUM CHLORIDE 0.9 % IV SOLN
INTRAVENOUS | Status: DC
  Administered 2015-10-26: 07:00:00 via INTRAVENOUS

## 2015-10-26 MED ORDER — SODIUM CHLORIDE 0.9 % IR SOLN
Status: AC
  Filled 2015-10-26: qty 2

## 2015-10-26 MED ORDER — LIDOCAINE HCL (PF) 1 % IJ SOLN
INTRAMUSCULAR | Status: DC | PRN
  Administered 2015-10-26: 45 mL

## 2015-10-26 MED ORDER — ACETAMINOPHEN 325 MG PO TABS
325.0000 mg | ORAL_TABLET | ORAL | Status: DC | PRN
  Administered 2015-10-26: 325 mg via ORAL
  Administered 2015-10-26: 650 mg via ORAL
  Administered 2015-10-26: 325 mg via ORAL
  Administered 2015-10-27: 650 mg via ORAL
  Filled 2015-10-26 (×2): qty 2

## 2015-10-26 MED ORDER — CEFAZOLIN SODIUM-DEXTROSE 2-3 GM-% IV SOLR
INTRAVENOUS | Status: AC
  Filled 2015-10-26: qty 50

## 2015-10-26 MED ORDER — MUPIROCIN 2 % EX OINT
TOPICAL_OINTMENT | CUTANEOUS | Status: AC
  Administered 2015-10-26: 07:00:00
  Filled 2015-10-26: qty 22

## 2015-10-26 MED ORDER — CHLORHEXIDINE GLUCONATE 4 % EX LIQD: 60.0000 mL | Freq: Once | CUTANEOUS | Status: DC

## 2015-10-26 MED ORDER — LEVOTHYROXINE SODIUM 25 MCG PO TABS
125.0000 ug | ORAL_TABLET | Freq: Every day | ORAL | Status: DC
  Administered 2015-10-26 – 2015-10-27 (×2): 125 ug via ORAL
  Filled 2015-10-26 (×2): qty 1

## 2015-10-26 SURGICAL SUPPLY — 7 items
CABLE SURGICAL S-101-97-12 (CABLE) ×2 IMPLANT
LEAD CAPSURE NOVUS 5076-52CM (Lead) ×2 IMPLANT
PAD DEFIB LIFELINK (PAD) ×2 IMPLANT
PPM'ADVISA SR MRI A3SR01 (Pacemaker) ×1 IMPLANT
PPMADVISA SR MRI A3SR01 (Pacemaker) ×1 IMPLANT
SHEATH CLASSIC 7F (SHEATH) ×2 IMPLANT
TRAY PACEMAKER INSERTION (PACKS) ×2 IMPLANT

## 2015-10-26 NOTE — Progress Notes (Signed)
Orthopedic Tech Progress Note Patient Details:  Tracey Mcclure 28-May-1941 [id number]  Ortho Devices Type of Ortho Device: Arm sling Ortho Device/Splint Interventions: Application   [place] Pink 10/26/2015, 1:12 PM

## 2015-10-26 NOTE — H&P (View-Only) (Signed)
HPI Tracey Mcclure is referred today by Dr. Martinique to consider PPM insertion. She is a pleasant 75yo woman with a h/o hypertrophic cardiomyopathy, chronic atrial fibrillation and symptomatic bradycardia, now with high grade heart block (mostly complete). She gets dizzy a lot but has not had frank syncope. No other complaints today. She has undergone exercise testing and notes that her HR got up to 80/min. If she walks slowly she is ok on flat ground but does not do well when she is in a hurry. No Known Allergies   Current Outpatient Prescriptions  Medication Sig Dispense Refill  . acetaminophen (TYLENOL) 325 MG tablet Take 650 mg by mouth every 6 (six) hours as needed.    [name] Kitchen apixaban (ELIQUIS) 5 MG TABS tablet Take 1 tablet (5 mg total) by mouth 2 (two) times daily. 180 tablet 3  . atorvastatin (LIPITOR) 40 MG tablet Take 40 mg by mouth daily.    . cholecalciferol (VITAMIN D) 1000 UNITS tablet Take 1,000 Units by mouth daily.    . DiphenhydrAMINE HCl (BENADRYL PO) Take by mouth as needed.      . hydrochlorothiazide 25 MG tablet Take 12.5 mg by mouth every other day.     . levothyroxine (SYNTHROID, LEVOTHROID) 125 MCG tablet Take 125 mcg by mouth daily. Every day except Sundays.    . Probiotic Product (PROBIOTIC DAILY PO) Take by mouth daily.    . ranitidine (ZANTAC) 150 MG capsule Take 150 mg by mouth every evening.    . vitamin B-12 (CYANOCOBALAMIN) 1000 MCG tablet Take 1,000 mcg by mouth daily.     No current facility-administered medications for this visit.     Past Medical History  Diagnosis Date  . Hypertrophic obstructive cardiomyopathy   . Mitral insufficiency   . HTN (hypertension)     CONTROLLED  . Palpitations   . Hypercholesteremia   . Hypothyroidism   . GERD (gastroesophageal reflux disease)   . Obesity   . Colon adenoma   . 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  . Iron deficiency anemia, unspecified 10/14/2013  . Gastric mass 10/14/2013  .  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  . Atrial fibrillation ([place]) 12/05/2013    ROS:   All systems reviewed and negative except as noted in the HPI.   Past Surgical History  Procedure Laterality Date  . Abdominal hysterectomy    . Ovary surgery      BENIGN TUMOR     Family History  Problem Relation Age of Onset  . Heart attack Brother 69  . Prostate cancer Brother   . Breast cancer Sister 26     Social History   Social History  . Marital Status: Widowed    Spouse Name: N/A  . Number of Children: 1  . Years of Education: N/A   Occupational History  . Art gallery manager     retired   Social History Main Topics  . Smoking status: Never Smoker   . Smokeless tobacco: Never Used  . Alcohol Use: No  . Drug Use: No  . Sexual Activity: Not on file   Other Topics Concern  . Not on file   Social History Narrative     BP 124/72 mmHg  Pulse 43  Ht 5\' 5"  (1.651 m)  Wt 179 lb (81.194 kg)  BMI 29.79 kg/m2  LMP  (LMP Unknown)  Physical Exam:  Well appearing NAD HEENT: Unremarkable Neck:  No JVD, no thyromegally Lymphatics:  No  adenopathy Back:  No CVA tenderness Lungs:  Clear with no wheezes HEART:  Regular brady rate and rhythm, no murmurs, no rubs, no clicks Abd:  soft, positive bowel sounds, no organomegally, no rebound, no guarding Ext:  2 plus pulses, no edema, no cyanosis, no clubbing Skin:  No rashes no nodules Neuro:  CN II through XII intact, motor grossly intact  EKG - atrial fib with a slow and regular ventricular response    Assess/Plan: 1. 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 2. Atrial fibrillation - her ventricular rate is controlled. As she is not particularly symptomatic, I am not sure we should even consider rhythm control. I will speak to Dr. Martinique. 3. HTN - her blood pressure is well controlled on low  dose diuretic therapy. Once her PPM is in, might consider switching to a beta blocker in the setting of HCM. 4. Dyslipidemia - she will continue lipitor  Tracey Mcclure,M.D.

## 2015-10-26 NOTE — Interval H&P Note (Signed)
History and Physical Interval Note:  10/26/2015 7:03 AM  Tracey Mcclure  has presented today for surgery, with the diagnosis of sss  The various methods of treatment have been discussed with the patient and family. After consideration of risks, benefits and other options for treatment, the patient has consented to  Procedure(s): Pacemaker Implant (N/A) as a surgical intervention .  The patient's history has been reviewed, patient examined, no change in status, stable for surgery.  I have reviewed the patient's chart and labs.  Questions were answered to the patient's satisfaction.     Cristopher Peru

## 2015-10-26 NOTE — Discharge Summary (Signed)
ELECTROPHYSIOLOGY PROCEDURE DISCHARGE SUMMARY    Patient ID: Tracey Mcclure,  MRN: [id number], DOB/AGE: Sep 12, 1940 75 y.o.  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  Permanent atrial fibrillation 2.  Hypertension 3.  Hyperlipidemia 4.  Hypothyroidism 5.  Obesity 6.  HOCM  No Known Allergies   Procedures This Admission:  1.  Implantation of a MDT single chamber PPM on 10/26/15 by Dr Lovena Le.  The patient received a MDT model number Advisa PPM with model number [id number] right ventricular lead. There were no immediate post procedure complications. 2.  CXR on 10/27/15 demonstrated no pneumothorax status post device implantation.   Brief HPI: Tracey Mcclure is a 75 y.o. female was referred to electrophysiology in the outpatient setting for consideration of PPM implantation.  Past medical history includes HCM, permanent atrial fibrillation and symptomatic bradycardia.  The patient has had symptomatic bradycardia without reversible causes identified.  Risks, benefits, and alternatives to PPM implantation were reviewed with the patient who wished to proceed.   Hospital Course:  The patient was admitted and underwent implantation of a MDT MRI compatible single chamber pacemaker with details as outlined above.  She  was monitored on telemetry overnight which demonstrated atrial fibrillation with ventricular pacing.  Left chest was without hematoma or ecchymosis.  The device was interrogated and found to be functioning normally.  CXR was obtained and demonstrated no pneumothorax status post device implantation.  Wound care, arm mobility, and restrictions were reviewed with the patient.  The patient was examined and considered stable for  discharge to home.    Physical Exam: Filed Vitals:   10/27/15 0200 10/27/15 0300 10/27/15 0400 10/27/15 0500  BP:      Pulse: 59 60 59 60  Temp:   97.6 F (36.4 C)   TempSrc:   Oral   Resp: 16 12 11 11   Height:      Weight:      SpO2: 100% 97% 98% 96%    GEN- The patient is elderly appearing, alert and oriented x 3 today.   HEENT: normocephalic, atraumatic; sclera clear, conjunctiva pink; hearing intact; oropharynx clear; neck supple Lungs- Clear to ausculation bilaterally, normal work of breathing.  No wheezes, rales, rhonchi Heart- Regular rate and rhythm (paced) GI- soft, non-tender, non-distended, bowel sounds present  Extremities- no clubbing, cyanosis, or edema; DP/PT/radial pulses 2+ bilaterally MS- no significant deformity or atrophy Skin- warm and dry, no rash or lesion, left chest without hematoma/ecchymosis Psych- euthymic mood, full affect Neuro- strength and sensation are intact   Labs:   Lab Results  Component Value Date   WBC 5.3 10/19/2015   HGB 12.8 10/19/2015   HCT 37.8 10/19/2015   MCV 92.9 10/19/2015   PLT 284 10/19/2015   No results for input(s): NA, K, CL, CO2, BUN, CREATININE, CALCIUM, PROT, BILITOT, ALKPHOS, ALT, AST, GLUCOSE in the last 168 hours.  Invalid input(s): LABALBU  Discharge Medications:    Medication List    TAKE these medications        acetaminophen 325 MG tablet  Commonly known as:  TYLENOL  Take 650 mg by mouth every 6 (six) hours as needed for mild pain or headache.     apixaban 5 MG Tabs tablet  Commonly known as:  ELIQUIS  Take 1 tablet (5  mg total) by mouth 2 (two) times daily.     atorvastatin 40 MG tablet  Commonly known as:  LIPITOR  Take 40 mg by mouth daily.     BENADRYL PO  Take 25 mg by mouth at bedtime as needed (for sleep).     cholecalciferol 1000 units tablet  Commonly known as:  VITAMIN D  Take 1,000 Units by mouth daily.     hydrochlorothiazide 25 MG tablet  Commonly known as:  HYDRODIURIL    Take 12.5 mg by mouth every other day.     hydrocortisone cream 0.5 %  Apply 1 application topically daily as needed for itching.     levothyroxine 125 MCG tablet  Commonly known as:  SYNTHROID, LEVOTHROID  Take 125 mcg by mouth daily. Every day except Sundays.     PROBIOTIC DAILY PO  Take 1 tablet by mouth daily.     ranitidine 150 MG capsule  Commonly known as:  ZANTAC  Take 150 mg by mouth every evening.     vitamin B-12 1000 MCG tablet  Commonly known as:  CYANOCOBALAMIN  Take 1,000 mcg by mouth daily.        Disposition:  Discharge Instructions    Diet - low sodium heart healthy    Complete by:  As directed      Increase activity slowly    Complete by:  As directed           Follow-up Information    Follow up with [facility] On 11/05/2015.   Specialty:  Cardiology   Why:  at [place] for wound check    Contact information:   [address] [place] [phone]      Follow up with Cristopher Peru, MD On 01/26/2016.   Specialty:  Cardiology   Why:  at 9:15AM    Contact information:   [address]. [place] [address] [phone]       Duration of Discharge Encounter: Greater than 30 minutes including physician time.  Signed, Chanetta Marshall, NP 10/27/2015 7:37 AM   EP Attending  Patient seen and examined. Agree with the findings as noted above. [place] for discharge. Tracey Mcclure PPM is working normally. Usual followup.  Mikle Bosworth.D.

## 2015-10-27 ENCOUNTER — Ambulatory Visit (HOSPITAL_COMMUNITY): Payer: Medicare Other

## 2015-10-27 DIAGNOSIS — I482 Chronic atrial fibrillation: Secondary | ICD-10-CM | POA: Diagnosis not present

## 2015-10-27 DIAGNOSIS — E039 Hypothyroidism, unspecified: Secondary | ICD-10-CM | POA: Diagnosis not present

## 2015-10-27 DIAGNOSIS — I421 Obstructive hypertrophic cardiomyopathy: Secondary | ICD-10-CM | POA: Diagnosis not present

## 2015-10-27 DIAGNOSIS — I10 Essential (primary) hypertension: Secondary | ICD-10-CM | POA: Diagnosis not present

## 2015-10-27 DIAGNOSIS — E785 Hyperlipidemia, unspecified: Secondary | ICD-10-CM | POA: Diagnosis not present

## 2015-10-27 DIAGNOSIS — I442 Atrioventricular block, complete: Secondary | ICD-10-CM | POA: Diagnosis not present

## 2015-10-27 DIAGNOSIS — Z950 Presence of cardiac pacemaker: Secondary | ICD-10-CM | POA: Diagnosis not present

## 2015-10-27 NOTE — Discharge Instructions (Signed)
° ° °  Supplemental Discharge Instructions for  Pacemaker/Defibrillator Patients  Activity No heavy lifting or vigorous activity with your left/right arm for 6 to 8 weeks.  Do not raise your left/right arm above your head for one week.  Gradually raise your affected arm as drawn below.           __      10/30/15                   10/31/15                     11/01/15                 11/02/15  NO DRIVING for  1 week   ; you may begin driving on   [id number]  .  WOUND CARE - Keep the wound area clean and dry.  Do not get this area wet for one week. No showers for one week; you may shower on  11/02/15   . - The tape/steri-strips on your wound will fall off; do not pull them off.  No bandage is needed on the site.  DO  NOT apply any creams, oils, or ointments to the wound area. - If you notice any drainage or discharge from the wound, any swelling or bruising at the site, or you develop a fever > 101? F after you are discharged home, call the office at once.  Special Instructions - You are still able to use cellular telephones; use the ear opposite the side where you have your pacemaker/defibrillator.  Avoid carrying your cellular phone near your device. - When traveling through airports, show security personnel your identification card to avoid being screened in the metal detectors.  Ask the security personnel to use the hand wand. - Avoid arc welding equipment, TENS units (transcutaneous nerve stimulators).  Call the office for questions about other devices. - Avoid electrical appliances that are in poor condition or are not properly grounded. - Microwave ovens are safe to be near or to operate.

## 2015-11-05 ENCOUNTER — Encounter: Payer: Self-pay | Admitting: Internal Medicine

## 2015-11-05 ENCOUNTER — Ambulatory Visit (INDEPENDENT_AMBULATORY_CARE_PROVIDER_SITE_OTHER): Payer: Medicare Other | Admitting: *Deleted

## 2015-11-05 DIAGNOSIS — R001 Bradycardia, unspecified: Secondary | ICD-10-CM

## 2015-11-05 LAB — CUP PACEART INCLINIC DEVICE CHECK
Date Time Interrogation Session: 20170330101802
Implantable Lead Implant Date: 20170320
Lead Channel Impedance Value: 494 Ohm
Lead Channel Pacing Threshold Pulse Width: 0.4 ms
Lead Channel Sensing Intrinsic Amplitude: 12.625 mV
Lead Channel Sensing Intrinsic Amplitude: 14.375 mV
Lead Channel Setting Pacing Amplitude: 3.5 V
MDC IDC LEAD LOCATION: 753860
MDC IDC MSMT BATTERY VOLTAGE: 3.04 V
MDC IDC MSMT LEADCHNL RV IMPEDANCE VALUE: 570 Ohm
MDC IDC MSMT LEADCHNL RV PACING THRESHOLD AMPLITUDE: 0.75 V
MDC IDC SET LEADCHNL RV PACING PULSEWIDTH: 0.4 ms
MDC IDC SET LEADCHNL RV SENSING SENSITIVITY: 2.8 mV
MDC IDC STAT BRADY RV PERCENT PACED: 93.38 %

## 2015-11-05 NOTE — Progress Notes (Signed)
Wound check appointment. Steri-strips removed. Wound without redness or edema. Incision edges approximated, wound well healed. Normal device function. Threshold sensing, and impedances consistent with implant measurements. Device programmed at 3.5V for extra safety margin until 3 month visit. Histogram distribution appropriate for patient and level of activity. No high ventricular rates noted. Patient educated about wound care, arm mobility, lifting restrictions. ROV with [facility] in June .

## 2015-11-16 ENCOUNTER — Telehealth: Payer: Self-pay | Admitting: Cardiology

## 2015-11-16 NOTE — Telephone Encounter (Signed)
Spoke with pt. Told her we do not have any samples today. Pt stated she had already spoke with Malachy Mood who is handling the situation that her copay for her medication for Eliquis has gone up.   Will defer to [facility].

## 2015-11-16 NOTE — Telephone Encounter (Signed)
Patient calling the office for samples of medication:   1.  What medication and dosage are you requesting samples for?Eliquis  2.  Are you currently out of this medication? No,just 3 left

## 2015-11-19 MED ORDER — APIXABAN 5 MG PO TABS: 5.0000 mg | ORAL_TABLET | Freq: Two times a day (BID) | ORAL | Status: DC

## 2015-11-19 NOTE — Telephone Encounter (Signed)
Spoke to patient.Patient assistance form for Eliquis completed and faxed to [facility] at fax # [phone].Office still out of Eliquis samples.Stated she had to buy Eliquis yesterday.

## 2015-11-30 DIAGNOSIS — E038 Other specified hypothyroidism: Secondary | ICD-10-CM | POA: Diagnosis not present

## 2015-11-30 DIAGNOSIS — R8299 Other abnormal findings in urine: Secondary | ICD-10-CM | POA: Diagnosis not present

## 2015-11-30 DIAGNOSIS — E784 Other hyperlipidemia: Secondary | ICD-10-CM | POA: Diagnosis not present

## 2015-11-30 DIAGNOSIS — R7301 Impaired fasting glucose: Secondary | ICD-10-CM | POA: Diagnosis not present

## 2015-11-30 DIAGNOSIS — I10 Essential (primary) hypertension: Secondary | ICD-10-CM | POA: Diagnosis not present

## 2015-11-30 DIAGNOSIS — N390 Urinary tract infection, site not specified: Secondary | ICD-10-CM | POA: Diagnosis not present

## 2015-12-07 DIAGNOSIS — Z6828 Body mass index (BMI) 28.0-28.9, adult: Secondary | ICD-10-CM | POA: Diagnosis not present

## 2015-12-07 DIAGNOSIS — E038 Other specified hypothyroidism: Secondary | ICD-10-CM | POA: Diagnosis not present

## 2015-12-07 DIAGNOSIS — I4891 Unspecified atrial fibrillation: Secondary | ICD-10-CM | POA: Diagnosis not present

## 2015-12-07 DIAGNOSIS — I517 Cardiomegaly: Secondary | ICD-10-CM | POA: Diagnosis not present

## 2015-12-07 DIAGNOSIS — Z1231 Encounter for screening mammogram for malignant neoplasm of breast: Secondary | ICD-10-CM | POA: Diagnosis not present

## 2015-12-07 DIAGNOSIS — R5383 Other fatigue: Secondary | ICD-10-CM | POA: Diagnosis not present

## 2015-12-07 DIAGNOSIS — D508 Other iron deficiency anemias: Secondary | ICD-10-CM | POA: Diagnosis not present

## 2015-12-07 DIAGNOSIS — D472 Monoclonal gammopathy: Secondary | ICD-10-CM | POA: Diagnosis not present

## 2015-12-07 DIAGNOSIS — Z1389 Encounter for screening for other disorder: Secondary | ICD-10-CM | POA: Diagnosis not present

## 2015-12-07 DIAGNOSIS — R001 Bradycardia, unspecified: Secondary | ICD-10-CM | POA: Diagnosis not present

## 2015-12-07 DIAGNOSIS — Z0000 Encounter for general adult medical examination without abnormal findings: Secondary | ICD-10-CM | POA: Diagnosis not present

## 2015-12-07 DIAGNOSIS — R600 Localized edema: Secondary | ICD-10-CM | POA: Diagnosis not present

## 2015-12-22 DIAGNOSIS — Z1212 Encounter for screening for malignant neoplasm of rectum: Secondary | ICD-10-CM | POA: Diagnosis not present

## 2015-12-24 ENCOUNTER — Encounter: Payer: Self-pay | Admitting: Cardiology

## 2015-12-24 ENCOUNTER — Ambulatory Visit (INDEPENDENT_AMBULATORY_CARE_PROVIDER_SITE_OTHER): Payer: Medicare Other | Admitting: Cardiology

## 2015-12-24 VITALS — BP 146/80 | HR 85 | Ht 65.0 in | Wt 180.0 lb

## 2015-12-24 DIAGNOSIS — I10 Essential (primary) hypertension: Secondary | ICD-10-CM | POA: Diagnosis not present

## 2015-12-24 DIAGNOSIS — I482 Chronic atrial fibrillation, unspecified: Secondary | ICD-10-CM

## 2015-12-24 DIAGNOSIS — I441 Atrioventricular block, second degree: Secondary | ICD-10-CM

## 2015-12-24 DIAGNOSIS — E7800 Pure hypercholesterolemia, unspecified: Secondary | ICD-10-CM | POA: Diagnosis not present

## 2015-12-24 DIAGNOSIS — I421 Obstructive hypertrophic cardiomyopathy: Secondary | ICD-10-CM | POA: Diagnosis not present

## 2015-12-24 MED ORDER — HYDROCHLOROTHIAZIDE 25 MG PO TABS: 12.5000 mg | ORAL_TABLET | Freq: Every day | ORAL | Status: DC

## 2015-12-24 NOTE — Progress Notes (Signed)
Tracey Mcclure Date of Birth: 1941-07-07   History of Present Illness: Tracey Mcclure is seen today for followup of atrial fibrillation and bradycardia. She has a history of hypertrophic obstructive cardiomyopathy, hypertension, and Mobitz type I second-degree AV block. When seen in April 2015 she was noted to be in atrial fibrillation with a slow ventricular response of 53. She had only mild palpitations. Verapamil was discontinued. 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 Prior extensive GI work up for heme positive stool and iron deficiency anemia was unrevealing.  She had a Holter monitor in august 2016 that showed sinus rhythm with PACs and PVCs. Some intermittent second degree AV block. Mean HR 65 with slowest HR 31 and longest pause of 3.1 seconds. She was seen by Rosaria Ferries PA. At that time Ecg showed AFib with rate 43 bpm. She complained of some intermittent edema and fatigue.  She was seen by Dr. Lovena Le and underwent PPM placement in March. Since then she still notes slight swelling in feet and legs. No palpitations or dizziness. She thinks pacer position is too high and seems to interfere with shoulder movement.   Current Outpatient Prescriptions on File Prior to Visit  Medication Sig Dispense Refill  . acetaminophen (TYLENOL) 325 MG tablet Take 650 mg by mouth every 6 (six) hours as needed for mild pain or headache.     [name] Kitchen apixaban (ELIQUIS) 5 MG TABS tablet Take 1 tablet (5 mg total) by mouth 2 (two) times daily. 180 tablet 3  . atorvastatin (LIPITOR) 40 MG tablet Take 40 mg by mouth daily.    . cholecalciferol (VITAMIN D) 1000 UNITS tablet Take 1,000 Units by mouth daily.    . DiphenhydrAMINE HCl (BENADRYL PO) Take 25 mg by mouth at bedtime as needed (for sleep).     . hydrocortisone cream 0.5 % Apply 1 application topically daily as needed for itching.    . levothyroxine (SYNTHROID, LEVOTHROID) 125 MCG tablet Take 125 mcg by mouth daily. Every day except Sundays.    .  Probiotic Product (PROBIOTIC DAILY PO) Take 1 tablet by mouth daily.     . ranitidine (ZANTAC) 150 MG capsule Take 150 mg by mouth every evening.    . vitamin B-12 (CYANOCOBALAMIN) 1000 MCG tablet Take 1,000 mcg by mouth daily.     No current facility-administered medications on file prior to visit.    No Known Allergies  Past Medical History  Diagnosis Date  . Hypertrophic obstructive cardiomyopathy   . Mitral insufficiency   . HTN (hypertension)     CONTROLLED  . Palpitations   . Hypercholesteremia   . Hypothyroidism   . GERD (gastroesophageal reflux disease)   . Obesity   . Colon adenoma   . 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  . Iron deficiency anemia, unspecified 10/14/2013  . Gastric mass 10/14/2013  . 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  . Atrial fibrillation ([place]) 12/05/2013    Past Surgical History  Procedure Laterality Date  . Abdominal hysterectomy    . Ovary surgery      BENIGN TUMOR  . Ep implantable device N/A 10/26/2015    Procedure: Pacemaker Implant;  Surgeon: Evans Lance, MD;  Location: [facility];  Service: Cardiovascular;  Laterality: N/A;    History  Smoking status  . Never Smoker   Smokeless tobacco  . Never Used    History  Alcohol Use No    Family History  Problem Relation Age  of Onset  . Heart attack Brother 52  . Prostate cancer Brother   . Breast cancer Sister 4    Review of Systems: As noted in history of present illness.All other systems were reviewed and are negative.  Physical Exam: BP 146/80 mmHg  Pulse 85  Ht 5\' 5"  (1.651 m)  Wt 81.647 kg (180 lb)  BMI 29.95 kg/m2  LMP  (LMP Unknown) She is a pleasant overweight white female in no acute distress. HEENT exam is unremarkable. Sclerae are clear. Oropharynx is clear. Neck is supple no JVD, adenopathy, thyromegaly, or bruits. Lungs are clear. Cardiac exam reveals a regular rate and rhythm with a grade [id number] systolic  murmur at the left sternal border. Pacer site has healed well. She has no S3. Abdomen is obese, soft, nontender. She has no edema. Pedal pulses are 2+. She is alert and oriented x3. Cranial nerves II through XII are intact.  LABORATORY DATA: Lab Results  Component Value Date   WBC 5.3 10/19/2015   HGB 12.8 10/19/2015   HCT 37.8 10/19/2015   PLT 284 10/19/2015   GLUCOSE 91 10/19/2015   ALT 16 02/12/2015   AST 23 02/12/2015   NA 140 10/19/2015   K 3.9 10/19/2015   CL 106 10/19/2015   CREATININE 0.81 10/19/2015   BUN 12 10/19/2015   CO2 25 10/19/2015    Assessment / Plan: 1. Atrial fibrillation- paroxysmal- with slow ventricular response.  History of sinus bradycardia with second-degree Mobitz type I AV block.  CHAD-Vasc score of 3-4. On Eliquis for anticoagulation. Now s/p PPM. Follow up in device clinic.   2. Hypertrophic cardiomyopathy. Echocardiogram in January 2016 is unchanged from 2013 and 2015.  She is asymptomatic.  I am not sure she would benefit from beta blocker therapy since she is not symptomatic and did not have a significant outflow murmur. She has mild edema. Will increase HCTZ to 12.5 mg daily. Instructed on sodium restricted diet.   3. 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 Asymptomatic.  4. Hypertension, controlled.  5. Iron deficiency anemia with heme positive stool. Negative GI work up in past.  6. MGUS

## 2015-12-24 NOTE — Patient Instructions (Signed)
Increase your HCTZ to daily  Continue your other therapy  I will see you in 6 months.

## 2016-01-12 ENCOUNTER — Telehealth: Payer: Self-pay | Admitting: Pharmacist Clinician (PhC)/ Clinical Pharmacy Specialist

## 2016-01-12 NOTE — Telephone Encounter (Signed)
Patient assistance from [employer] arrived.  90 day supply Eliquis 5 mg.  LMOM for patient to pick up

## 2016-01-26 ENCOUNTER — Encounter: Payer: Self-pay | Admitting: Internal Medicine

## 2016-01-26 ENCOUNTER — Ambulatory Visit (INDEPENDENT_AMBULATORY_CARE_PROVIDER_SITE_OTHER): Payer: Medicare Other | Admitting: Internal Medicine

## 2016-01-26 VITALS — BP 130/82 | HR 85 | Ht 65.0 in | Wt 179.6 lb

## 2016-01-26 DIAGNOSIS — R001 Bradycardia, unspecified: Secondary | ICD-10-CM

## 2016-01-26 NOTE — Progress Notes (Signed)
HPI Tracey Mcclure returns today for PPM followup. She is a pleasant 75yo woman with a h/o hypertrophic cardiomyopathy, chronic atrial fibrillation and symptomatic bradycardia. She underwent PPM insertion about 3 months ago. She notes that her heart is much more regular and she does not apppreciate her atrial fib as much. She notes some residual soreness over her PM insertion site. No Known Allergies   Current Outpatient Prescriptions  Medication Sig Dispense Refill  . acetaminophen (TYLENOL) 325 MG tablet Take 650 mg by mouth every 6 (six) hours as needed for mild pain or headache.     [name] Kitchen apixaban (ELIQUIS) 5 MG TABS tablet Take 1 tablet (5 mg total) by mouth 2 (two) times daily. 180 tablet 3  . atorvastatin (LIPITOR) 40 MG tablet Take 40 mg by mouth daily.    . cholecalciferol (VITAMIN D) 1000 UNITS tablet Take 1,000 Units by mouth daily.    . DiphenhydrAMINE HCl (BENADRYL PO) Take 25 mg by mouth at bedtime as needed (for sleep).     . hydrochlorothiazide (HYDRODIURIL) 25 MG tablet Take 0.5 tablets (12.5 mg total) by mouth daily. 90 tablet 3  . hydrocortisone cream 0.5 % Apply 1 application topically daily as needed for itching.    . levothyroxine (SYNTHROID, LEVOTHROID) 125 MCG tablet Take 125 mcg by mouth daily. Every day except Sundays.    . Probiotic Product (PROBIOTIC DAILY PO) Take 1 tablet by mouth daily.     . ranitidine (ZANTAC) 150 MG capsule Take 150 mg by mouth every evening.    . vitamin B-12 (CYANOCOBALAMIN) 1000 MCG tablet Take 1,000 mcg by mouth daily.     No current facility-administered medications for this visit.     Past Medical History  Diagnosis Date  . Hypertrophic obstructive cardiomyopathy   . Mitral insufficiency   . HTN (hypertension)     CONTROLLED  . Palpitations   . Hypercholesteremia   . Hypothyroidism   . GERD (gastroesophageal reflux disease)   . Obesity   . Colon adenoma   . 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  .  Iron deficiency anemia, unspecified 10/14/2013  . Gastric mass 10/14/2013  . 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  . Atrial fibrillation ([place]) 12/05/2013    ROS:   All systems reviewed and negative except as noted in the HPI.   Past Surgical History  Procedure Laterality Date  . Abdominal hysterectomy    . Ovary surgery      BENIGN TUMOR  . Ep implantable device N/A 10/26/2015    Procedure: Pacemaker Implant;  Surgeon: Evans Lance, MD;  Location: [facility];  Service: Cardiovascular;  Laterality: N/A;     Family History  Problem Relation Age of Onset  . Heart attack Brother 92  . Prostate cancer Brother   . Breast cancer Sister 15     Social History   Social History  . Marital Status: Widowed    Spouse Name: N/A  . Number of Children: 1  . Years of Education: N/A   Occupational History  . Art gallery manager     retired   Social History Main Topics  . Smoking status: Never Smoker   . Smokeless tobacco: Never Used  . Alcohol Use: No  . Drug Use: No  . Sexual Activity: No   Other Topics Concern  . Not on file   Social History Narrative     BP 130/82 mmHg  Pulse 85  Ht 5'  5" (1.651 m)  Wt 179 lb 9.6 oz (81.466 kg)  BMI 29.89 kg/m2  LMP  (LMP Unknown)  Physical Exam:  Well appearing 75 yo woman, NAD HEENT: Unremarkable Neck:  6 cm JVD, no thyromegally Lymphatics:  No adenopathy Back:  No CVA tenderness Lungs:  Clear with no wheezes HEART:  Regular brady rate and rhythm, no murmurs, no rubs, no clicks Abd:  soft, positive bowel sounds, no organomegally, no rebound, no guarding Ext:  2 plus pulses, no edema, no cyanosis, no clubbing Skin:  No rashes no nodules Neuro:  CN II through XII intact, motor grossly intact  PM interogation - normal VVIR PM function    Assess/Plan: 1. Symptomatic bradycardia due to high grade heart block - she is s/p PPM and doing well. 2. Atrial fibrillation - her ventricular rate is controlled.  She will continue her Eliquis 3. HTN - her blood pressure is well controlled on low dose diuretic therapy. I will defer the decision on beta blocker therapy to Dr. [place].  Mikle Bosworth.D.

## 2016-01-26 NOTE — Patient Instructions (Signed)
Medication Instructions:  Your physician recommends that you continue on your current medications as directed. Please refer to the Current Medication list given to you today.   Labwork: None ordered   Testing/Procedures: None ordered   Follow-Up:  Your physician wants you to follow-up in: 9 months with Dr Knox Saliva will receive a reminder letter in the mail two months in advance. If you don't receive a letter, please call our office to schedule the follow-up appointment.   Remote monitoring is used to monitor your Pacemaker  from home. This monitoring reduces the number of office visits required to check your device to one time per year. It allows [place] to keep an eye on the functioning of your device to ensure it is working properly. You are scheduled for a device check from home on 04/26/16. You may send your transmission at any time that day. If you have a wireless device, the transmission will be sent automatically. After your physician reviews your transmission, you will receive a postcard with your next transmission date.    Any Other Special Instructions Will Be Listed Below (If Applicable).     If you need a refill on your cardiac medications before your next appointment, please call your pharmacy.

## 2016-02-10 ENCOUNTER — Telehealth: Payer: Self-pay | Admitting: Hematology and Oncology

## 2016-02-10 NOTE — Telephone Encounter (Signed)
pt cld wanting to chge lab to earlier time-chgd appt gave r/s time for 9 on 7/7

## 2016-02-12 ENCOUNTER — Other Ambulatory Visit: Payer: BLUE CROSS/BLUE SHIELD

## 2016-02-12 ENCOUNTER — Other Ambulatory Visit (HOSPITAL_BASED_OUTPATIENT_CLINIC_OR_DEPARTMENT_OTHER): Payer: Medicare Other

## 2016-02-12 ENCOUNTER — Ambulatory Visit: Payer: BLUE CROSS/BLUE SHIELD | Admitting: Hematology and Oncology

## 2016-02-12 DIAGNOSIS — D472 Monoclonal gammopathy: Secondary | ICD-10-CM

## 2016-02-12 LAB — COMPREHENSIVE METABOLIC PANEL
ALT: 15 U/L (ref 0–55)
AST: 23 U/L (ref 5–34)
Albumin: 3.5 g/dL (ref 3.5–5.0)
Alkaline Phosphatase: 103 U/L (ref 40–150)
Anion Gap: 10 mEq/L (ref 3–11)
BUN: 15.5 mg/dL (ref 7.0–26.0)
CHLORIDE: 108 meq/L (ref 98–109)
CO2: 21 meq/L — AB (ref 22–29)
Calcium: 9.6 mg/dL (ref 8.4–10.4)
Creatinine: 1 mg/dL (ref 0.6–1.1)
EGFR: 57 mL/min/{1.73_m2} — AB (ref >90)
GLUCOSE: 102 mg/dL (ref 70–140)
POTASSIUM: 3.6 meq/L (ref 3.5–5.1)
SODIUM: 139 meq/L (ref 136–145)
Total Bilirubin: 0.85 mg/dL (ref 0.20–1.20)
Total Protein: 6.7 g/dL (ref 6.4–8.3)

## 2016-02-12 LAB — CBC WITH DIFFERENTIAL/PLATELET
BASO%: 0.8 % (ref 0.0–2.0)
BASOS ABS: 0 10*3/uL (ref 0.0–0.1)
EOS ABS: 0.1 10*3/uL (ref 0.0–0.5)
EOS%: 2.3 % (ref 0.0–7.0)
HCT: 39.6 % (ref 34.8–46.6)
HGB: 13.7 g/dL (ref 11.6–15.9)
LYMPH%: 36.9 % (ref 14.0–49.7)
MCH: 31.6 pg (ref 25.1–34.0)
MCHC: 34.6 g/dL (ref 31.5–36.0)
MCV: 91.2 fL (ref 79.5–101.0)
MONO#: 0.2 10*3/uL (ref 0.1–0.9)
MONO%: 4.9 % (ref 0.0–14.0)
NEUT#: 2.7 10*3/uL (ref 1.5–6.5)
NEUT%: 55.1 % (ref 38.4–76.8)
Platelets: 248 10*3/uL (ref 145–400)
RBC: 4.34 10*6/uL (ref 3.70–5.45)
RDW: 12.8 % (ref 11.2–14.5)
WBC: 4.9 10*3/uL (ref 3.9–10.3)
lymph#: 1.8 10*3/uL (ref 0.9–3.3)

## 2016-02-12 LAB — LACTATE DEHYDROGENASE: LDH: 214 U/L (ref 125–245)

## 2016-02-15 LAB — MULTIPLE MYELOMA PANEL, SERUM
ALBUMIN SERPL ELPH-MCNC: 3.6 g/dL (ref 2.9–4.4)
ALPHA 1: 0.2 g/dL (ref 0.0–0.4)
Albumin/Glob SerPl: 1.3 (ref 0.7–1.7)
Alpha2 Glob SerPl Elph-Mcnc: 0.7 g/dL (ref 0.4–1.0)
B-Globulin SerPl Elph-Mcnc: 1 g/dL (ref 0.7–1.3)
GAMMA GLOB SERPL ELPH-MCNC: 1 g/dL (ref 0.4–1.8)
GLOBULIN, TOTAL: 2.9 g/dL (ref 2.2–3.9)
IGA/IMMUNOGLOBULIN A, SERUM: 400 mg/dL (ref 64–422)
IgG, Qn, Serum: 834 mg/dL (ref 700–1600)
IgM, Qn, Serum: 131 mg/dL (ref 26–217)
M Protein SerPl Elph-Mcnc: 0.3 g/dL — ABNORMAL HIGH
Total Protein: 6.5 g/dL (ref 6.0–8.5)

## 2016-02-15 LAB — KAPPA/LAMBDA LIGHT CHAINS
IG LAMBDA FREE LIGHT CHAIN: 32.5 mg/L — AB (ref 5.7–26.3)
Ig Kappa Free Light Chain: 30.9 mg/L — ABNORMAL HIGH (ref 3.3–19.4)
KAPPA/LAMBDA FLC RATIO: 0.95 (ref 0.26–1.65)

## 2016-02-19 ENCOUNTER — Ambulatory Visit (HOSPITAL_BASED_OUTPATIENT_CLINIC_OR_DEPARTMENT_OTHER): Payer: Medicare Other | Admitting: Hematology and Oncology

## 2016-02-19 ENCOUNTER — Telehealth: Payer: Self-pay | Admitting: Hematology and Oncology

## 2016-02-19 VITALS — BP 141/78 | HR 87 | Temp 98.3°F | Resp 18 | Ht 65.0 in | Wt 180.8 lb

## 2016-02-19 DIAGNOSIS — D472 Monoclonal gammopathy: Secondary | ICD-10-CM | POA: Diagnosis not present

## 2016-02-19 DIAGNOSIS — I421 Obstructive hypertrophic cardiomyopathy: Secondary | ICD-10-CM

## 2016-02-19 DIAGNOSIS — Z853 Personal history of malignant neoplasm of breast: Secondary | ICD-10-CM | POA: Diagnosis not present

## 2016-02-19 DIAGNOSIS — Z1231 Encounter for screening mammogram for malignant neoplasm of breast: Secondary | ICD-10-CM | POA: Diagnosis not present

## 2016-02-19 NOTE — Telephone Encounter (Signed)
per pof to sch pt appt-gave pt copy of avs °

## 2016-02-20 ENCOUNTER — Encounter: Payer: Self-pay | Admitting: Hematology and Oncology

## 2016-02-20 NOTE — Assessment & Plan Note (Signed)
she will continue current medical management per cardiologist I recommend close follow-up with cardiologist for medication adjustment.  

## 2016-02-20 NOTE — Progress Notes (Signed)
[facility] OFFICE PROGRESS NOTE  Patient Care Team: Crist Infante, MD as PCP - General (Internal Medicine) Heath Lark, MD as Consulting Physician (Hematology and Oncology)  SUMMARY OF ONCOLOGIC HISTORY:  She was found to have abnormal CBC from routine blood tests. On 10/07/2013, CBC showed hemoglobin of 8.8 with evidence of iron deficiency. Ferritin level was 7. And review her blood work from 2012, the patient is not anemic. 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 Her last colonoscopy was in 2014 which showed no evidence of abnormalities apart from some benign colon polyps. CT scan of the abdomen show abnormal mass in the stomach. EGD did not reveal any abnormalities. Anemia resolved with iron supplements. In June 2015, further workup showed no progression of MGUS and she is being observed.  INTERVAL HISTORY: Please see below for problem oriented charting.  Overall she feels well. Denies new bone pain. She denies recent infection. She had pacemaker implantation and is on chronic anticoagulation therapy for heart condition The patient denies any recent signs or symptoms of bleeding such as spontaneous epistaxis, hematuria or hematochezia.  REVIEW OF SYSTEMS:   Constitutional: Denies fevers, chills or abnormal weight loss Eyes: Denies blurriness of vision Ears, nose, mouth, throat, and face: Denies mucositis or sore throat Respiratory: Denies cough, dyspnea or wheezes Cardiovascular: Denies palpitation, chest discomfort or lower extremity swelling Gastrointestinal:  Denies nausea, heartburn or change in bowel habits Skin: Denies abnormal skin rashes Lymphatics: Denies new lymphadenopathy or easy bruising Neurological:Denies numbness, tingling or new weaknesses Behavioral/Psych: Mood is stable, no new changes  All other systems were reviewed with the patient and are negative.  I  have reviewed the past medical history, past surgical history, social history and family history with the patient and they are unchanged from previous note.  ALLERGIES:  has No Known Allergies.  MEDICATIONS:  Current Outpatient Prescriptions  Medication Sig Dispense Refill  . acetaminophen (TYLENOL) 325 MG tablet Take 650 mg by mouth every 6 (six) hours as needed for mild pain or headache.     [name] Kitchen apixaban (ELIQUIS) 5 MG TABS tablet Take 1 tablet (5 mg total) by mouth 2 (two) times daily. 180 tablet 3  . atorvastatin (LIPITOR) 40 MG tablet Take 40 mg by mouth daily.    . cholecalciferol (VITAMIN D) 1000 UNITS tablet Take 1,000 Units by mouth daily.    . DiphenhydrAMINE HCl (BENADRYL PO) Take 25 mg by mouth at bedtime as needed (for sleep).     . hydrochlorothiazide (HYDRODIURIL) 25 MG tablet Take 0.5 tablets (12.5 mg total) by mouth daily. 90 tablet 3  . hydrocortisone cream 0.5 % Apply 1 application topically daily as needed for itching.    . levothyroxine (SYNTHROID, LEVOTHROID) 125 MCG tablet Take 125 mcg by mouth daily. Every day except Sundays.    . Probiotic Product (PROBIOTIC DAILY PO) Take 1 tablet by mouth daily.     . ranitidine (ZANTAC) 150 MG capsule Take 150 mg by mouth every evening.    . vitamin B-12 (CYANOCOBALAMIN) 1000 MCG tablet Take 1,000 mcg by mouth daily.     No current facility-administered medications for this visit.    PHYSICAL EXAMINATION: ECOG PERFORMANCE STATUS: 0 - Asymptomatic  Filed Vitals:   02/19/16 1329  BP: 141/78  Pulse: 87  Temp: 98.3 F (36.8 C)  Resp: 18   Filed Weights   02/19/16 1329  Weight: 180 lb 12.8 oz (82.01 kg)  GENERAL:alert, no distress and comfortable SKIN: skin color, texture, turgor are normal, no rashes or significant lesions EYES: normal, Conjunctiva are pink and non-injected, sclera clear OROPHARYNX:no exudate, no erythema and lips, buccal mucosa, and tongue normal  NECK: supple, thyroid normal size, non-tender,  without nodularity LYMPH:  no palpable lymphadenopathy in the cervical, axillary or inguinal LUNGS: clear to auscultation and percussion with normal breathing effort HEART: irregular rate & rhythm with soft systolic murmurs and no lower extremity edema ABDOMEN:abdomen soft, non-tender and normal bowel sounds Musculoskeletal:no cyanosis of digits and no clubbing  NEURO: alert & oriented x 3 with fluent speech, no focal motor/sensory deficits  LABORATORY DATA:  I have reviewed the data as listed    Component Value Date/Time   NA 139 02/12/2016 0904   NA 140 10/19/2015 1114   K 3.6 02/12/2016 0904   K 3.9 10/19/2015 1114   CL 106 10/19/2015 1114   CO2 21* 02/12/2016 0904   CO2 25 10/19/2015 1114   GLUCOSE 102 02/12/2016 0904   GLUCOSE 91 10/19/2015 1114   BUN 15.5 02/12/2016 0904   BUN 12 10/19/2015 1114   CREATININE 1.0 02/12/2016 0904   CREATININE 0.81 10/19/2015 1114   CREATININE 0.89 04/07/2011 1200   CALCIUM 9.6 02/12/2016 0904   CALCIUM 9.1 10/19/2015 1114   PROT 6.7 02/12/2016 0904   PROT 6.5 02/12/2016 0904   ALBUMIN 3.5 02/12/2016 0904   AST 23 02/12/2016 0904   ALT 15 02/12/2016 0904   ALKPHOS 103 02/12/2016 0904   BILITOT 0.85 02/12/2016 0904   GFRNONAA >60 04/07/2011 1200   GFRAA >60 04/07/2011 1200    No results found for: SPEP, UPEP  Lab Results  Component Value Date   WBC 4.9 02/12/2016   NEUTROABS 2.7 02/12/2016   HGB 13.7 02/12/2016   HCT 39.6 02/12/2016   MCV 91.2 02/12/2016   PLT 248 02/12/2016      Chemistry      Component Value Date/Time   NA 139 02/12/2016 0904   NA 140 10/19/2015 1114   K 3.6 02/12/2016 0904   K 3.9 10/19/2015 1114   CL 106 10/19/2015 1114   CO2 21* 02/12/2016 0904   CO2 25 10/19/2015 1114   BUN 15.5 02/12/2016 0904   BUN 12 10/19/2015 1114   CREATININE 1.0 02/12/2016 0904   CREATININE 0.81 10/19/2015 1114   CREATININE 0.89 04/07/2011 1200      Component Value Date/Time   CALCIUM 9.6 02/12/2016 0904   CALCIUM  9.1 10/19/2015 1114   ALKPHOS 103 02/12/2016 0904   AST 23 02/12/2016 0904   ALT 15 02/12/2016 0904   BILITOT 0.85 02/12/2016 0904      ASSESSMENT & PLAN:  MGUS (monoclonal gammopathy of unknown significance) Blood tests, urine test and skeletal survey are not consistent with multiple myeloma. I recommend yearly visit with history, physical examination and blood work. I discussed with the natural history of multiple myeloma. At present time, she does not require further investigation or treatment.   Hypertrophic obstructive cardiomyopathy ([place]) she will continue current medical management per cardiologist I recommend close follow-up with cardiologist for medication adjustment.    Orders Placed This Encounter  Procedures  . CBC with Differential/Platelet    Standing Status: Future     Number of Occurrences:      Standing Expiration Date: 03/25/2017  . Comprehensive metabolic panel    Standing Status: Future     Number of Occurrences:      Standing Expiration Date: 03/25/2017  .  Multiple Myeloma Panel (SPEP&IFE w/QIG)    Standing Status: Future     Number of Occurrences:      Standing Expiration Date: 03/25/2017  . Kappa/lambda light chains    Standing Status: Future     Number of Occurrences:      Standing Expiration Date: 03/25/2017   All questions were answered. The patient knows to call the clinic with any problems, questions or concerns. No barriers to learning was detected. I spent 10 minutes counseling the patient face to face. The total time spent in the appointment was 15 minutes and more than 50% was on counseling and review of test results     [facility], [place], MD 02/20/2016 2:53 PM

## 2016-02-20 NOTE — Assessment & Plan Note (Signed)
Blood tests, urine test and skeletal survey are not consistent with multiple myeloma. I recommend yearly visit with history, physical examination and blood work. I discussed with the natural history of multiple myeloma. At present time, she does not require further investigation or treatment.

## 2016-04-04 ENCOUNTER — Telehealth: Payer: Self-pay | Admitting: Pharmacist Clinician (PhC)/ Clinical Pharmacy Specialist

## 2016-04-04 NOTE — Telephone Encounter (Signed)
Patient assistance Eliquis arrived, patient aware.  90 day supply

## 2016-04-26 ENCOUNTER — Ambulatory Visit (INDEPENDENT_AMBULATORY_CARE_PROVIDER_SITE_OTHER): Payer: Medicare Other | Admitting: *Deleted

## 2016-04-26 ENCOUNTER — Telehealth: Payer: Self-pay | Admitting: Cardiology

## 2016-04-26 DIAGNOSIS — R001 Bradycardia, unspecified: Secondary | ICD-10-CM | POA: Diagnosis not present

## 2016-04-26 NOTE — Progress Notes (Signed)
Remote pacemaker transmission.   

## 2016-04-26 NOTE — Telephone Encounter (Signed)
LMOVM reminding pt to send remote transmission.   

## 2016-04-27 ENCOUNTER — Encounter: Payer: Self-pay | Admitting: Cardiology

## 2016-05-05 DIAGNOSIS — H52203 Unspecified astigmatism, bilateral: Secondary | ICD-10-CM | POA: Diagnosis not present

## 2016-05-05 DIAGNOSIS — H43813 Vitreous degeneration, bilateral: Secondary | ICD-10-CM | POA: Diagnosis not present

## 2016-05-05 DIAGNOSIS — Z961 Presence of intraocular lens: Secondary | ICD-10-CM | POA: Diagnosis not present

## 2016-05-16 LAB — CUP PACEART REMOTE DEVICE CHECK
Implantable Lead Location: 753860
Lead Channel Impedance Value: 456 Ohm
Lead Channel Impedance Value: 513 Ohm
Lead Channel Pacing Threshold Amplitude: 0.625 V
Lead Channel Sensing Intrinsic Amplitude: 15 mV
Lead Channel Sensing Intrinsic Amplitude: 15 mV
MDC IDC LEAD IMPLANT DT: 20170320
MDC IDC MSMT BATTERY REMAINING LONGEVITY: 106 mo
MDC IDC MSMT BATTERY VOLTAGE: 3.02 V
MDC IDC MSMT LEADCHNL RV PACING THRESHOLD PULSEWIDTH: 0.4 ms
MDC IDC SESS DTM: 20170919162559
MDC IDC SET LEADCHNL RV PACING AMPLITUDE: 2 V
MDC IDC SET LEADCHNL RV PACING PULSEWIDTH: 0.4 ms
MDC IDC SET LEADCHNL RV SENSING SENSITIVITY: 2.8 mV
MDC IDC STAT BRADY RV PERCENT PACED: 98.3 %

## 2016-05-21 DIAGNOSIS — Z23 Encounter for immunization: Secondary | ICD-10-CM | POA: Diagnosis not present

## 2016-06-06 ENCOUNTER — Other Ambulatory Visit: Payer: Self-pay | Admitting: Cardiology

## 2016-06-06 MED ORDER — HYDROCHLOROTHIAZIDE 25 MG PO TABS: 12.5000 mg | ORAL_TABLET | Freq: Every day | ORAL | 2 refills | Status: AC

## 2016-06-06 NOTE — Telephone Encounter (Signed)
Rx(s) sent to pharmacy electronically.  

## 2016-06-06 NOTE — Telephone Encounter (Signed)
Needs a new rx refill of hydrochlorothiazide  90 day supply [facility] Have enough for the rest of this week

## 2016-06-24 ENCOUNTER — Telehealth: Payer: Self-pay | Admitting: Pharmacist

## 2016-06-24 NOTE — Telephone Encounter (Signed)
Received 90 supply Eliquis. Left at front desk for pick up.  LM to make pt aware

## 2016-07-26 ENCOUNTER — Ambulatory Visit (INDEPENDENT_AMBULATORY_CARE_PROVIDER_SITE_OTHER): Payer: Medicare Other | Admitting: *Deleted

## 2016-07-26 DIAGNOSIS — I441 Atrioventricular block, second degree: Secondary | ICD-10-CM

## 2016-07-26 NOTE — Progress Notes (Signed)
Remote pacemaker transmission.   

## 2016-07-27 ENCOUNTER — Telehealth: Payer: Self-pay

## 2016-07-27 ENCOUNTER — Encounter: Payer: Self-pay | Admitting: Cardiology

## 2016-07-27 NOTE — Telephone Encounter (Signed)
Received paper work from [employer] patient assistance for [employer].Patient stated she no longer needs patient assistance.Stated with her new insurance plan she can afford eliquis.

## 2016-07-28 LAB — CUP PACEART REMOTE DEVICE CHECK
Battery Remaining Longevity: 106 mo
Battery Voltage: 3.02 V
Implantable Lead Location: 753860
Implantable Lead Model: 5076
Implantable Pulse Generator Implant Date: 20170320
Lead Channel Impedance Value: 437 Ohm
Lead Channel Impedance Value: 513 Ohm
Lead Channel Pacing Threshold Amplitude: 0.5 V
Lead Channel Setting Pacing Pulse Width: 0.4 ms
Lead Channel Setting Sensing Sensitivity: 2.8 mV
MDC IDC LEAD IMPLANT DT: 20170320
MDC IDC MSMT LEADCHNL RV PACING THRESHOLD PULSEWIDTH: 0.4 ms
MDC IDC MSMT LEADCHNL RV SENSING INTR AMPL: 11.25 mV
MDC IDC MSMT LEADCHNL RV SENSING INTR AMPL: 11.25 mV
MDC IDC SESS DTM: 20171219161204
MDC IDC SET LEADCHNL RV PACING AMPLITUDE: 2 V
MDC IDC STAT BRADY RV PERCENT PACED: 98.17 %

## 2016-08-05 NOTE — Progress Notes (Signed)
Tracey Mcclure Date of Birth: 1941-06-02   History of Present Illness: Tracey Mcclure is seen today for followup of atrial fibrillation and bradycardia. She has a history of hypertrophic obstructive cardiomyopathy, hypertension, and Mobitz type I second-degree AV block. When seen in April 2015 she was noted to be in atrial fibrillation with a slow ventricular response of 53. She had only mild palpitations. Verapamil was discontinued. 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 Prior extensive GI work up for heme positive stool and iron deficiency anemia was unrevealing.  She had a Holter monitor in august 2016 that showed sinus rhythm with PACs and PVCs. Some intermittent second degree AV block. Mean HR 65 with slowest HR 31 and longest pause of 3.1 seconds. She was seen by Rosaria Ferries PA. At that time Ecg showed AFib with rate 43 bpm. She complained of some intermittent edema and fatigue.  She was seen by Dr. Lovena Le and underwent PPM placement in March 2017.  On follow up today she states she feels her age. Notes her stamina is not as good. Still able to do all her housework and take short walks. Complains of joint pain in right hip. Very mild DOE. Reports BP at home 120/70.  Current Outpatient Prescriptions on File Prior to Visit  Medication Sig Dispense Refill  . acetaminophen (TYLENOL) 325 MG tablet Take 650 mg by mouth every 6 (six) hours as needed for mild pain or headache.     [name] Kitchen apixaban (ELIQUIS) 5 MG TABS tablet Take 1 tablet (5 mg total) by mouth 2 (two) times daily. 180 tablet 3  . atorvastatin (LIPITOR) 40 MG tablet Take 40 mg by mouth daily.    . cholecalciferol (VITAMIN D) 1000 UNITS tablet Take 1,000 Units by mouth daily.    . DiphenhydrAMINE HCl (BENADRYL PO) Take 25 mg by mouth at bedtime as needed (for sleep).     . hydrochlorothiazide (HYDRODIURIL) 25 MG tablet Take 0.5 tablets (12.5 mg total) by mouth daily. 90 tablet 2  . hydrocortisone cream 0.5 % Apply 1 application  topically daily as needed for itching.    . levothyroxine (SYNTHROID, LEVOTHROID) 125 MCG tablet Take 125 mcg by mouth daily. Every day except Sundays.    . Probiotic Product (PROBIOTIC DAILY PO) Take 1 tablet by mouth daily.     . vitamin B-12 (CYANOCOBALAMIN) 1000 MCG tablet Take 1,000 mcg by mouth daily.     No current facility-administered medications on file prior to visit.     No Known Allergies  Past Medical History:  Diagnosis Date  . Atrial fibrillation ([place]) 12/05/2013  . Colon adenoma   . Gastric mass 10/14/2013  . GERD (gastroesophageal reflux disease)   . HTN (hypertension)    CONTROLLED  . Hypercholesteremia   . Hypertr obst cardiomyop   . Hypothyroidism   . Iron deficiency anemia, unspecified 10/14/2013  . 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  . Mitral insufficiency   . 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  . Obesity   . Palpitations     Past Surgical History:  Procedure Laterality Date  . ABDOMINAL HYSTERECTOMY    . EP IMPLANTABLE DEVICE N/A 10/26/2015   Procedure: Pacemaker Implant;  Surgeon: Evans Lance, MD;  Location: [facility];  Service: Cardiovascular;  Laterality: N/A;  . OVARY SURGERY     BENIGN TUMOR    History  Smoking Status  . Never Smoker  Smokeless Tobacco  . Never Used  History  Alcohol Use No    Family History  Problem Relation Age of Onset  . Heart attack Brother 85  . Prostate cancer Brother   . Breast cancer Sister 77    Review of Systems: As noted in history of present illness.All other systems were reviewed and are negative.  Physical Exam: BP (!) 157/95 (BP Location: Right Arm, Patient Position: Sitting, Cuff Size: Normal)   Pulse 82   Ht 5\' 5"  (1.651 m)   Wt 183 lb 2 oz (83.1 kg)   LMP  (LMP Unknown)   BMI 30.47 kg/m  She is a pleasant overweight white female in no acute distress. HEENT exam is unremarkable. Sclerae are clear. Oropharynx is clear. Neck is  supple no JVD, adenopathy, thyromegaly, or bruits. Lungs are clear. Cardiac exam reveals a regular rate and rhythm with a grade [id number] systolic murmur at the left sternal border. Pacer site has healed well. She has no S3. Abdomen is obese, soft, nontender. She has no edema. Pedal pulses are 2+. She is alert and oriented x3. Cranial nerves II through XII are intact.  LABORATORY DATA: Lab Results  Component Value Date   WBC 4.9 02/12/2016   HGB 13.7 02/12/2016   HCT 39.6 02/12/2016   PLT 248 02/12/2016   GLUCOSE 102 02/12/2016   ALT 15 02/12/2016   AST 23 02/12/2016   NA 139 02/12/2016   K 3.6 02/12/2016   CL 106 10/19/2015   CREATININE 1.0 02/12/2016   BUN 15.5 02/12/2016   CO2 21 (L) 02/12/2016   Labs dated 11/30/15: cholesterol 165, triglycerides 97, LDL 76, HDL 70. BMET and TSH normal.   Assessment / Plan: 1. Atrial fibrillation- paroxysmal- with slow ventricular response.  History of sinus bradycardia with second-degree Mobitz type I AV block.  CHAD-Vasc score of 3-4. On Eliquis for anticoagulation. s/p PPM. Pacer check in December was normal.   2. Hypertrophic cardiomyopathy. Echocardiogram in January 2016 is unchanged from 2013 and 2015.  She is asymptomatic.  I am not sure she would benefit from beta blocker therapy since she is not symptomatic and does have a significant outflow murmur. Will continue HCTZ. Edema well controlled with support hose. Instructed on sodium restricted diet.   3. 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 Asymptomatic.  4. Hypertension, elevated today but normally well controlled. Continue HCTZ and observe.   5. Iron deficiency anemia with heme positive stool. Negative GI work up in past.  6. MGUS- followed yearly by hematology.

## 2016-08-10 ENCOUNTER — Ambulatory Visit (INDEPENDENT_AMBULATORY_CARE_PROVIDER_SITE_OTHER): Payer: Medicare Other | Admitting: Cardiology

## 2016-08-10 ENCOUNTER — Encounter: Payer: Self-pay | Admitting: Cardiology

## 2016-08-10 VITALS — BP 157/95 | HR 82 | Ht 65.0 in | Wt 183.1 lb

## 2016-08-10 DIAGNOSIS — I421 Obstructive hypertrophic cardiomyopathy: Secondary | ICD-10-CM | POA: Diagnosis not present

## 2016-08-10 DIAGNOSIS — I441 Atrioventricular block, second degree: Secondary | ICD-10-CM

## 2016-08-10 DIAGNOSIS — I480 Paroxysmal atrial fibrillation: Secondary | ICD-10-CM

## 2016-08-10 DIAGNOSIS — I482 Chronic atrial fibrillation, unspecified: Secondary | ICD-10-CM

## 2016-08-10 DIAGNOSIS — I10 Essential (primary) hypertension: Secondary | ICD-10-CM | POA: Diagnosis not present

## 2016-08-10 NOTE — Patient Instructions (Signed)
Continue your current therapy  I will see you in 6  Months   

## 2016-10-04 ENCOUNTER — Telehealth: Payer: Self-pay

## 2016-10-04 NOTE — Telephone Encounter (Signed)
Received fax from Dr.Mackler's office wanting to verify if ok to hold eliquis for tooth extraction 48 hrs prior and restart the night of her procedure.Dr.Jordan advised ok note faxed back to fax # [phone].

## 2016-10-25 ENCOUNTER — Other Ambulatory Visit: Payer: Self-pay | Admitting: Pharmacist Clinician (PhC)/ Clinical Pharmacy Specialist

## 2016-10-25 ENCOUNTER — Other Ambulatory Visit: Payer: Self-pay | Admitting: *Deleted

## 2016-10-25 MED ORDER — APIXABAN 5 MG PO TABS: 5.0000 mg | ORAL_TABLET | Freq: Two times a day (BID) | ORAL | 5 refills | Status: DC

## 2016-11-01 ENCOUNTER — Ambulatory Visit (INDEPENDENT_AMBULATORY_CARE_PROVIDER_SITE_OTHER): Payer: Medicare Other | Admitting: Internal Medicine

## 2016-11-01 ENCOUNTER — Encounter: Payer: Medicare Other | Admitting: Internal Medicine

## 2016-11-01 ENCOUNTER — Encounter: Payer: Self-pay | Admitting: Internal Medicine

## 2016-11-01 VITALS — BP 148/90 | HR 86 | Ht 66.0 in | Wt 182.0 lb

## 2016-11-01 DIAGNOSIS — R001 Bradycardia, unspecified: Secondary | ICD-10-CM | POA: Diagnosis not present

## 2016-11-01 DIAGNOSIS — Z45018 Encounter for adjustment and management of other part of cardiac pacemaker: Secondary | ICD-10-CM | POA: Diagnosis not present

## 2016-11-01 DIAGNOSIS — I482 Chronic atrial fibrillation, unspecified: Secondary | ICD-10-CM

## 2016-11-01 NOTE — Progress Notes (Signed)
HPI [name] returns today for PPM followup. She is a pleasant 76 yo woman with a h/o hypertrophic cardiomyopathy, chronic atrial fibrillation and symptomatic bradycardia. She underwent PPM insertion about 12 months ago. She notes that her heart is much more regular and she does not apppreciate her atrial fib as much. She notes some residual soreness over her PM insertion site. When she tries to wash her back with her left arm, she notices her PPM but it does not hurt. She has some dyspnea with exertion. Her last echo was a year ago. No Known Allergies   Current Outpatient Prescriptions  Medication Sig Dispense Refill  . acetaminophen (TYLENOL) 325 MG tablet Take 650 mg by mouth every 6 (six) hours as needed for mild pain or headache.     [name] Kitchen apixaban (ELIQUIS) 5 MG TABS tablet Take 1 tablet (5 mg total) by mouth 2 (two) times daily. 60 tablet 5  . atorvastatin (LIPITOR) 40 MG tablet Take 40 mg by mouth daily.    . cholecalciferol (VITAMIN D) 1000 UNITS tablet Take 1,000 Units by mouth daily.    . DiphenhydrAMINE HCl (BENADRYL PO) Take 25 mg by mouth at bedtime as needed (for sleep).     . hydrochlorothiazide (HYDRODIURIL) 25 MG tablet Take 0.5 tablets (12.5 mg total) by mouth daily. 90 tablet 2  . hydrocortisone cream 0.5 % Apply 1 application topically daily as needed for itching.    . levothyroxine (SYNTHROID, LEVOTHROID) 125 MCG tablet Take 125 mcg by mouth daily. Every day except Sundays.    . Probiotic Product (PROBIOTIC DAILY PO) Take 1 tablet by mouth daily.     . vitamin B-12 (CYANOCOBALAMIN) 1000 MCG tablet Take 1,000 mcg by mouth daily.     No current facility-administered medications for this visit.      Past Medical History:  Diagnosis Date  . Atrial fibrillation ([place]) 12/05/2013  . Colon adenoma   . Gastric mass 10/14/2013  . GERD (gastroesophageal reflux disease)   . HTN (hypertension)    CONTROLLED  . Hypercholesteremia   . Hypertr obst cardiomyop   .  Hypothyroidism   . Iron deficiency anemia, unspecified 10/14/2013  . 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  . Mitral insufficiency   . 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  . Obesity   . Palpitations     ROS:   All systems reviewed and negative except as noted in the HPI.   Past Surgical History:  Procedure Laterality Date  . ABDOMINAL HYSTERECTOMY    . EP IMPLANTABLE DEVICE N/A 10/26/2015   Procedure: Pacemaker Implant;  Surgeon: Evans Lance, MD;  Location: [facility];  Service: Cardiovascular;  Laterality: N/A;  . OVARY SURGERY     BENIGN TUMOR     Family History  Problem Relation Age of Onset  . Heart attack Brother 51  . Prostate cancer Brother   . Breast cancer Sister 52     Social History   Social History  . Marital status: Widowed    Spouse name: N/A  . Number of children: 1  . Years of education: N/A   Occupational History  . Art gallery manager     retired   Social History Main Topics  . Smoking status: Never Smoker  . Smokeless tobacco: Never Used  . Alcohol use No  . Drug use: No  . Sexual activity: No   Other Topics Concern  . Not on file   Social  History Narrative  . No narrative on file     BP (!) 148/90   Pulse 86   Ht 5\' 6"  (1.676 m)   Wt 182 lb (82.6 kg)   LMP  (LMP Unknown)   BMI 29.38 kg/m   Physical Exam:  Well appearing 76 yo woman, NAD HEENT: Unremarkable Neck:  6 cm JVD, no thyromegally Lymphatics:  No adenopathy Back:  No CVA tenderness Lungs:  Clear with no wheezes HEART:  IRegular brady rate and rhythm, 1/6 systolic murmur, no rubs, no clicks Abd:  soft, positive bowel sounds, no organomegally, no rebound, no guarding Ext:  2 plus pulses, no edema, no cyanosis, no clubbing Skin:  No rashes no nodules Neuro:  CN II through XII intact, motor grossly intact  PM interogation - normal VVIR PM function    Assess/Plan: 1. 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 2. Atrial fibrillation - her ventricular rate is controlled. She will continue her Eliquis 3. HTN - her blood pressure has been fairly well controlled on low dose diuretic therapy. I will defer the decision on beta blocker therapy to Dr. [place].   Mikle Bosworth.D.

## 2016-11-01 NOTE — Patient Instructions (Signed)
Medication Instructions:    Your physician recommends that you continue on your current medications as directed. Please refer to the Current Medication list given to you today.  --- If you need a refill on your cardiac medications before your next appointment, please call your pharmacy. ---  Labwork:  None ordered  Testing/Procedures:  None ordered  Follow-Up: Remote monitoring is used to monitor your Pacemaker of ICD from home. This monitoring reduces the number of office visits required to check your device to one time per year. It allows [place] to keep an eye on the functioning of your device to ensure it is working properly. You are scheduled for a device check from home on 01/31/2017. You may send your transmission at any time that day. If you have a wireless device, the transmission will be sent automatically. After your physician reviews your transmission, you will receive a postcard with your next transmission date.   Your physician wants you to follow-up in: 1 year with Dr. Lovena Le.  You will receive a reminder letter in the mail two months in advance. If you don't receive a letter, please call our office to schedule the follow-up appointment.   Thank you for choosing [facility]!!

## 2016-11-02 LAB — CUP PACEART INCLINIC DEVICE CHECK
Implantable Lead Model: 5076
Lead Channel Setting Pacing Amplitude: 2 V
MDC IDC LEAD IMPLANT DT: 20170320
MDC IDC LEAD LOCATION: 753860
MDC IDC PG IMPLANT DT: 20170320
MDC IDC SESS DTM: 20180328082854
MDC IDC SET LEADCHNL RV PACING PULSEWIDTH: 0.4 ms
MDC IDC SET LEADCHNL RV SENSING SENSITIVITY: 2.8 mV

## 2016-12-19 DIAGNOSIS — I10 Essential (primary) hypertension: Secondary | ICD-10-CM | POA: Diagnosis not present

## 2016-12-19 DIAGNOSIS — E784 Other hyperlipidemia: Secondary | ICD-10-CM | POA: Diagnosis not present

## 2016-12-19 DIAGNOSIS — R7301 Impaired fasting glucose: Secondary | ICD-10-CM | POA: Diagnosis not present

## 2016-12-19 DIAGNOSIS — N390 Urinary tract infection, site not specified: Secondary | ICD-10-CM | POA: Diagnosis not present

## 2016-12-19 DIAGNOSIS — E038 Other specified hypothyroidism: Secondary | ICD-10-CM | POA: Diagnosis not present

## 2016-12-21 DIAGNOSIS — Z1212 Encounter for screening for malignant neoplasm of rectum: Secondary | ICD-10-CM | POA: Diagnosis not present

## 2016-12-26 DIAGNOSIS — Z1231 Encounter for screening mammogram for malignant neoplasm of breast: Secondary | ICD-10-CM | POA: Diagnosis not present

## 2016-12-26 DIAGNOSIS — N183 Chronic kidney disease, stage 3 (moderate): Secondary | ICD-10-CM | POA: Diagnosis not present

## 2016-12-26 DIAGNOSIS — D472 Monoclonal gammopathy: Secondary | ICD-10-CM | POA: Diagnosis not present

## 2016-12-26 DIAGNOSIS — Z6379 Other stressful life events affecting family and household: Secondary | ICD-10-CM | POA: Diagnosis not present

## 2016-12-26 DIAGNOSIS — Z1389 Encounter for screening for other disorder: Secondary | ICD-10-CM | POA: Diagnosis not present

## 2016-12-26 DIAGNOSIS — R600 Localized edema: Secondary | ICD-10-CM | POA: Diagnosis not present

## 2016-12-26 DIAGNOSIS — Z6830 Body mass index (BMI) 30.0-30.9, adult: Secondary | ICD-10-CM | POA: Diagnosis not present

## 2016-12-26 DIAGNOSIS — Z0000 Encounter for general adult medical examination without abnormal findings: Secondary | ICD-10-CM | POA: Diagnosis not present

## 2016-12-26 DIAGNOSIS — I517 Cardiomegaly: Secondary | ICD-10-CM | POA: Diagnosis not present

## 2016-12-26 DIAGNOSIS — I4891 Unspecified atrial fibrillation: Secondary | ICD-10-CM | POA: Diagnosis not present

## 2016-12-26 DIAGNOSIS — R5383 Other fatigue: Secondary | ICD-10-CM | POA: Diagnosis not present

## 2016-12-26 DIAGNOSIS — Z950 Presence of cardiac pacemaker: Secondary | ICD-10-CM | POA: Diagnosis not present

## 2017-01-19 DIAGNOSIS — Z780 Asymptomatic menopausal state: Secondary | ICD-10-CM | POA: Diagnosis not present

## 2017-01-19 DIAGNOSIS — N183 Chronic kidney disease, stage 3 (moderate): Secondary | ICD-10-CM | POA: Diagnosis not present

## 2017-01-31 ENCOUNTER — Telehealth: Payer: Self-pay | Admitting: Cardiology

## 2017-01-31 ENCOUNTER — Ambulatory Visit (INDEPENDENT_AMBULATORY_CARE_PROVIDER_SITE_OTHER): Payer: Medicare Other | Admitting: *Deleted

## 2017-01-31 DIAGNOSIS — I441 Atrioventricular block, second degree: Secondary | ICD-10-CM | POA: Diagnosis not present

## 2017-01-31 NOTE — Progress Notes (Signed)
Remote pacemaker transmission.   

## 2017-01-31 NOTE — Telephone Encounter (Signed)
Spoke with pt and reminded pt of remote transmission that is due today. Pt verbalized understanding.   

## 2017-02-01 ENCOUNTER — Encounter: Payer: Self-pay | Admitting: Cardiology

## 2017-02-01 LAB — CUP PACEART REMOTE DEVICE CHECK
Battery Remaining Longevity: 99 mo
Battery Voltage: 3.02 V
Brady Statistic RV Percent Paced: 98.53 %
Date Time Interrogation Session: 20180626173230
Implantable Lead Implant Date: 20170320
Implantable Lead Location: 753860
Implantable Lead Model: 5076
Implantable Pulse Generator Implant Date: 20170320
Lead Channel Impedance Value: 456 Ohm
Lead Channel Impedance Value: 532 Ohm
Lead Channel Pacing Threshold Amplitude: 0.75 V
Lead Channel Pacing Threshold Pulse Width: 0.4 ms
Lead Channel Sensing Intrinsic Amplitude: 18.125 mV
Lead Channel Sensing Intrinsic Amplitude: 18.125 mV
Lead Channel Setting Pacing Amplitude: 2 V
Lead Channel Setting Pacing Pulse Width: 0.4 ms
Lead Channel Setting Sensing Sensitivity: 2.8 mV

## 2017-02-10 ENCOUNTER — Other Ambulatory Visit (HOSPITAL_BASED_OUTPATIENT_CLINIC_OR_DEPARTMENT_OTHER): Payer: Medicare Other

## 2017-02-10 DIAGNOSIS — D472 Monoclonal gammopathy: Secondary | ICD-10-CM | POA: Diagnosis not present

## 2017-02-10 LAB — COMPREHENSIVE METABOLIC PANEL
ALT: 14 U/L (ref 0–55)
ANION GAP: 8 meq/L (ref 3–11)
AST: 21 U/L (ref 5–34)
Albumin: 3.7 g/dL (ref 3.5–5.0)
Alkaline Phosphatase: 115 U/L (ref 40–150)
BUN: 14.8 mg/dL (ref 7.0–26.0)
CO2: 23 meq/L (ref 22–29)
Calcium: 10.1 mg/dL (ref 8.4–10.4)
Chloride: 105 mEq/L (ref 98–109)
Creatinine: 1 mg/dL (ref 0.6–1.1)
EGFR: 52 mL/min/{1.73_m2} — AB (ref >90)
GLUCOSE: 87 mg/dL (ref 70–140)
POTASSIUM: 3.9 meq/L (ref 3.5–5.1)
SODIUM: 137 meq/L (ref 136–145)
Total Bilirubin: 0.66 mg/dL (ref 0.20–1.20)
Total Protein: 7 g/dL (ref 6.4–8.3)

## 2017-02-10 LAB — CBC WITH DIFFERENTIAL/PLATELET
BASO%: 0.4 % (ref 0.0–2.0)
BASOS ABS: 0 10*3/uL (ref 0.0–0.1)
EOS%: 2.2 % (ref 0.0–7.0)
Eosinophils Absolute: 0.2 10*3/uL (ref 0.0–0.5)
HCT: 40.1 % (ref 34.8–46.6)
HGB: 13.8 g/dL (ref 11.6–15.9)
LYMPH%: 42.1 % (ref 14.0–49.7)
MCH: 32.4 pg (ref 25.1–34.0)
MCHC: 34.4 g/dL (ref 31.5–36.0)
MCV: 94.1 fL (ref 79.5–101.0)
MONO#: 0.5 10*3/uL (ref 0.1–0.9)
MONO%: 7.4 % (ref 0.0–14.0)
NEUT#: 3.4 10*3/uL (ref 1.5–6.5)
NEUT%: 47.9 % (ref 38.4–76.8)
PLATELETS: 266 10*3/uL (ref 145–400)
RBC: 4.26 10*6/uL (ref 3.70–5.45)
RDW: 12.9 % (ref 11.2–14.5)
WBC: 7.2 10*3/uL (ref 3.9–10.3)
lymph#: 3 10*3/uL (ref 0.9–3.3)

## 2017-02-13 LAB — KAPPA/LAMBDA LIGHT CHAINS
Ig Kappa Free Light Chain: 25.9 mg/L — ABNORMAL HIGH (ref 3.3–19.4)
Ig Lambda Free Light Chain: 31.5 mg/L — ABNORMAL HIGH (ref 5.7–26.3)
KAPPA/LAMBDA FLC RATIO: 0.82 (ref 0.26–1.65)

## 2017-02-14 LAB — MULTIPLE MYELOMA PANEL, SERUM
ALBUMIN/GLOB SERPL: 1.3 (ref 0.7–1.7)
ALPHA 1: 0.2 g/dL (ref 0.0–0.4)
ALPHA2 GLOB SERPL ELPH-MCNC: 0.7 g/dL (ref 0.4–1.0)
Albumin SerPl Elph-Mcnc: 3.5 g/dL (ref 2.9–4.4)
B-GLOBULIN SERPL ELPH-MCNC: 1.1 g/dL (ref 0.7–1.3)
GAMMA GLOB SERPL ELPH-MCNC: 0.9 g/dL (ref 0.4–1.8)
GLOBULIN, TOTAL: 2.9 g/dL (ref 2.2–3.9)
IGG (IMMUNOGLOBIN G), SERUM: 911 mg/dL (ref 700–1600)
IgA, Qn, Serum: 409 mg/dL (ref 64–422)
IgM, Qn, Serum: 134 mg/dL (ref 26–217)
M PROTEIN SERPL ELPH-MCNC: 0.2 g/dL — AB
Total Protein: 6.4 g/dL (ref 6.0–8.5)

## 2017-02-17 ENCOUNTER — Ambulatory Visit (HOSPITAL_BASED_OUTPATIENT_CLINIC_OR_DEPARTMENT_OTHER): Payer: Medicare Other | Admitting: Hematology and Oncology

## 2017-02-17 VITALS — BP 153/81 | HR 86 | Temp 99.0°F | Resp 20 | Ht 66.0 in | Wt 177.9 lb

## 2017-02-17 DIAGNOSIS — D472 Monoclonal gammopathy: Secondary | ICD-10-CM

## 2017-02-17 DIAGNOSIS — I421 Obstructive hypertrophic cardiomyopathy: Secondary | ICD-10-CM

## 2017-02-18 ENCOUNTER — Telehealth: Payer: Self-pay | Admitting: Hematology and Oncology

## 2017-02-18 ENCOUNTER — Encounter: Payer: Self-pay | Admitting: Hematology and Oncology

## 2017-02-18 NOTE — Progress Notes (Signed)
[place] OFFICE PROGRESS NOTE  Patient Care Team: Crist Infante, MD as PCP - General (Internal Medicine) Heath Lark, MD as Consulting Physician (Hematology and Oncology)  SUMMARY OF ONCOLOGIC HISTORY:  She was found to have abnormal CBC from routine blood tests. On 10/07/2013, CBC showed hemoglobin of 8.8 with evidence of iron deficiency. Ferritin level was 7. And review her blood work from 2012, the patient is not anemic. 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 Her last colonoscopy was in 2014 which showed no evidence of abnormalities apart from some benign colon polyps. CT scan of the abdomen show abnormal mass in the stomach. EGD did not reveal any abnormalities. Anemia resolved with iron supplements. In June 2015, further workup showed no progression of MGUS and she is being observed.  INTERVAL HISTORY: Please see below for problem oriented charting.  Overall she feels well. Denies new bone pain. She denies recent infection. She had pacemaker implantation and is on chronic anticoagulation therapy for heart condition She denies any recent signs or symptoms of congestive heart failure.  REVIEW OF SYSTEMS:   Constitutional: Denies fevers, chills or abnormal weight loss Eyes: Denies blurriness of vision Ears, nose, mouth, throat, and face: Denies mucositis or sore throat Respiratory: Denies cough, dyspnea or wheezes Cardiovascular: Denies palpitation, chest discomfort or lower extremity swelling Gastrointestinal:  Denies nausea, heartburn or change in bowel habits Skin: Denies abnormal skin rashes Lymphatics: Denies new lymphadenopathy or easy bruising Neurological:Denies numbness, tingling or new weaknesses Behavioral/Psych: Mood is stable, no new changes  All other systems were reviewed with the patient and are negative.  I have reviewed the past medical history, past  surgical history, social history and family history with the patient and they are unchanged from previous note.  ALLERGIES:  has No Known Allergies.  MEDICATIONS:  Current Outpatient Prescriptions  Medication Sig Dispense Refill  . acetaminophen (TYLENOL) 325 MG tablet Take 650 mg by mouth every 6 (six) hours as needed for mild pain or headache.     [name] Kitchen apixaban (ELIQUIS) 5 MG TABS tablet Take 1 tablet (5 mg total) by mouth 2 (two) times daily. 60 tablet 5  . atorvastatin (LIPITOR) 40 MG tablet Take 40 mg by mouth daily.    . cholecalciferol (VITAMIN D) 1000 UNITS tablet Take 1,000 Units by mouth daily.    . DiphenhydrAMINE HCl (BENADRYL PO) Take 25 mg by mouth at bedtime as needed (for sleep).     . hydrochlorothiazide (HYDRODIURIL) 25 MG tablet Take 0.5 tablets (12.5 mg total) by mouth daily. 90 tablet 2  . hydrocortisone cream 0.5 % Apply 1 application topically daily as needed for itching.    . levothyroxine (SYNTHROID, LEVOTHROID) 125 MCG tablet Take 125 mcg by mouth daily. Every day except Sundays.    . Probiotic Product (PROBIOTIC DAILY PO) Take 1 tablet by mouth daily.     . vitamin B-12 (CYANOCOBALAMIN) 1000 MCG tablet Take 1,000 mcg by mouth daily.     No current facility-administered medications for this visit.     PHYSICAL EXAMINATION: ECOG PERFORMANCE STATUS: 0 - Asymptomatic  Vitals:   02/17/17 1402  BP: (!) 153/81  Pulse: 86  Resp: 20  Temp: 99 F (37.2 C)   Filed Weights   02/17/17 1402  Weight: 177 lb 14.4 oz (80.7 kg)    GENERAL:alert, no distress and comfortable SKIN: skin color, texture, turgor are normal, no rashes or significant lesions EYES:  normal, Conjunctiva are pink and non-injected, sclera clear OROPHARYNX:no exudate, no erythema and lips, buccal mucosa, and tongue normal  NECK: supple, thyroid normal size, non-tender, without nodularity LYMPH:  no palpable lymphadenopathy in the cervical, axillary or inguinal LUNGS: clear to auscultation and  percussion with normal breathing effort HEART: regular rate & rhythm with heart murmur, and no lower extremity edema ABDOMEN:abdomen soft, non-tender and normal bowel sounds Musculoskeletal:no cyanosis of digits and no clubbing  NEURO: alert & oriented x 3 with fluent speech, no focal motor/sensory deficits  LABORATORY DATA:  I have reviewed the data as listed    Component Value Date/Time   NA 137 02/10/2017 0954   K 3.9 02/10/2017 0954   CL 106 10/19/2015 1114   CO2 23 02/10/2017 0954   GLUCOSE 87 02/10/2017 0954   BUN 14.8 02/10/2017 0954   CREATININE 1.0 02/10/2017 0954   CALCIUM 10.1 02/10/2017 0954   PROT 7.0 02/10/2017 0954   ALBUMIN 3.7 02/10/2017 0954   AST 21 02/10/2017 0954   ALT 14 02/10/2017 0954   ALKPHOS 115 02/10/2017 0954   BILITOT 0.66 02/10/2017 0954   GFRNONAA >60 04/07/2011 1200   GFRAA >60 04/07/2011 1200    No results found for: SPEP, UPEP  Lab Results  Component Value Date   WBC 7.2 02/10/2017   NEUTROABS 3.4 02/10/2017   HGB 13.8 02/10/2017   HCT 40.1 02/10/2017   MCV 94.1 02/10/2017   PLT 266 02/10/2017      Chemistry      Component Value Date/Time   NA 137 02/10/2017 0954   K 3.9 02/10/2017 0954   CL 106 10/19/2015 1114   CO2 23 02/10/2017 0954   BUN 14.8 02/10/2017 0954   CREATININE 1.0 02/10/2017 0954      Component Value Date/Time   CALCIUM 10.1 02/10/2017 0954   ALKPHOS 115 02/10/2017 0954   AST 21 02/10/2017 0954   ALT 14 02/10/2017 0954   BILITOT 0.66 02/10/2017 0954       ASSESSMENT & PLAN:  MGUS (monoclonal gammopathy of unknown significance) Blood tests, urine test and skeletal survey are not consistent with multiple myeloma. She has no signs of disease progression I recommend yearly visit with history, physical examination and blood work. I discussed with the natural history of multiple myeloma.  Hypertrophic obstructive cardiomyopathy ([place]) she will continue current medical management per cardiologist I recommend  close follow-up with cardiologist for medication adjustment.    Orders Placed This Encounter  Procedures  . CBC with Differential/Platelet    Standing Status:   Future    Standing Expiration Date:   03/24/2018  . Comprehensive metabolic panel    Standing Status:   Future    Standing Expiration Date:   03/24/2018  . Kappa/lambda light chains    Standing Status:   Future    Standing Expiration Date:   03/24/2018  . Multiple Myeloma Panel (SPEP&IFE w/QIG)    Standing Status:   Future    Standing Expiration Date:   03/24/2018   All questions were answered. The patient knows to call the clinic with any problems, questions or concerns. No barriers to learning was detected. I spent 10 minutes counseling the patient face to face. The total time spent in the appointment was 15 minutes and more than 50% was on counseling and review of test results     Heath Lark, MD 02/18/2017 10:38 AM

## 2017-02-18 NOTE — Telephone Encounter (Signed)
Left message re July 2019 appointments. Mailed schedule.

## 2017-02-18 NOTE — Assessment & Plan Note (Signed)
Blood tests, urine test and skeletal survey are not consistent with multiple myeloma. She has no signs of disease progression I recommend yearly visit with history, physical examination and blood work. I discussed with the natural history of multiple myeloma. 

## 2017-02-18 NOTE — Assessment & Plan Note (Signed)
she will continue current medical management per cardiologist I recommend close follow-up with cardiologist for medication adjustment.

## 2017-02-20 DIAGNOSIS — Z803 Family history of malignant neoplasm of breast: Secondary | ICD-10-CM | POA: Diagnosis not present

## 2017-02-20 DIAGNOSIS — Z1231 Encounter for screening mammogram for malignant neoplasm of breast: Secondary | ICD-10-CM | POA: Diagnosis not present

## 2017-02-22 NOTE — Progress Notes (Signed)
Tracey Mcclure Date of Birth: 09-07-1940   History of Present Illness: Tracey Mcclure is seen today for followup of atrial fibrillation and bradycardia. She has a history of hypertrophic obstructive cardiomyopathy, hypertension, and Mobitz type I second-degree AV block. When seen in April 2015 she was noted to be in atrial fibrillation with a slow ventricular response of 53. She had only mild palpitations. Verapamil was discontinued. 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 Prior extensive GI work up for heme positive stool and iron deficiency anemia was unrevealing.   She had a Holter monitor in august 2016 that showed sinus rhythm with PACs and PVCs. Some intermittent second degree AV block. Mean HR 65 with slowest HR 31 and longest pause of 3.1 seconds. She was seen by Rosaria Ferries PA. At that time Ecg showed AFib with rate 43 bpm. She complained of some intermittent edema and fatigue.  She was seen by Dr. Lovena Le and underwent PPM placement in March 2017.  Last pacemaker check in June 2018 was normal.   On follow up today she states she is under a lot of stress with son having stage 4 cancer. Notes her stamina is not as good. Sleeps 5 hours/night at most. Notes BP has been running higher.  Very mild DOE.   Current Outpatient Prescriptions on File Prior to Visit  Medication Sig Dispense Refill  . acetaminophen (TYLENOL) 325 MG tablet Take 650 mg by mouth every 6 (six) hours as needed for mild pain or headache.     [name] Kitchen apixaban (ELIQUIS) 5 MG TABS tablet Take 1 tablet (5 mg total) by mouth 2 (two) times daily. 60 tablet 5  . atorvastatin (LIPITOR) 40 MG tablet Take 40 mg by mouth daily.    . Cholecalciferol (VITAMIN D) 2000 units CAPS Take 2,000 Units by mouth daily.     . DiphenhydrAMINE HCl (BENADRYL PO) Take 25 mg by mouth at bedtime as needed (for sleep).     . hydrochlorothiazide (HYDRODIURIL) 25 MG tablet Take 0.5 tablets (12.5 mg total) by mouth daily. 90 tablet 2  . hydrocortisone  cream 0.5 % Apply 1 application topically daily as needed for itching.    . levothyroxine (SYNTHROID, LEVOTHROID) 125 MCG tablet Take 125 mcg by mouth daily. Every day except Sundays.    . Probiotic Product (PROBIOTIC DAILY PO) Take 1 tablet by mouth daily.     . vitamin B-12 (CYANOCOBALAMIN) 1000 MCG tablet Take 1,000 mcg by mouth daily.     No current facility-administered medications on file prior to visit.     No Known Allergies  Past Medical History:  Diagnosis Date  . Atrial fibrillation ([place]) 12/05/2013  . Colon adenoma   . Gastric mass 10/14/2013  . GERD (gastroesophageal reflux disease)   . HTN (hypertension)    CONTROLLED  . Hypercholesteremia   . Hypertr obst cardiomyop   . Hypothyroidism   . Iron deficiency anemia, unspecified 10/14/2013  . 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  . Mitral insufficiency   . 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  . Obesity   . Palpitations     Past Surgical History:  Procedure Laterality Date  . ABDOMINAL HYSTERECTOMY    . EP IMPLANTABLE DEVICE N/A 10/26/2015   Procedure: Pacemaker Implant;  Surgeon: Evans Lance, MD;  Location: [place] [facility];  Service: Cardiovascular;  Laterality: N/A;  . OVARY SURGERY     BENIGN TUMOR    History  Smoking Status  .  Never Smoker  Smokeless Tobacco  . Never Used    History  Alcohol Use No    Family History  Problem Relation Age of Onset  . Heart attack Brother 16  . Prostate cancer Brother   . Breast cancer Sister 70    Review of Systems: As noted in history of present illness.All other systems were reviewed and are negative.  Physical Exam: BP (!) 164/88   Pulse 68   Ht 5' 4.5" (1.638 m)   Wt 181 lb 9.6 oz (82.4 kg)   LMP  (LMP Unknown)   BMI 30.69 kg/m  She is a pleasant overweight white female in no acute distress. HEENT exam is unremarkable. Sclerae are clear. Oropharynx is clear. Neck is supple no JVD, adenopathy,  thyromegaly, or bruits. Lungs are clear. Cardiac exam reveals a regular rate and rhythm with a grade 1-3/2 systolic murmur at the left sternal border. Pacer site has healed well. She has no S3. Abdomen is obese, soft, nontender. She has no edema. Pedal pulses are 2+. She is alert and oriented x3. Cranial nerves II through XII are intact.  LABORATORY DATA: Lab Results  Component Value Date   WBC 7.2 02/10/2017   HGB 13.8 02/10/2017   HCT 40.1 02/10/2017   PLT 266 02/10/2017   GLUCOSE 87 02/10/2017   ALT 14 02/10/2017   AST 21 02/10/2017   NA 137 02/10/2017   K 3.9 02/10/2017   CL 106 10/19/2015   CREATININE 1.0 02/10/2017   BUN 14.8 02/10/2017   CO2 23 02/10/2017   Labs dated 11/30/15: cholesterol 165, triglycerides 97, LDL 76, HDL 70. BMET and TSH normal.  Dated 12/19/16: cholesterol 148, triglycerides 90, HDL 65, LDL 65. BMET and TSH normal. A1c 5.5%.   Ecg today shows underlying atrial fib with V pacing rate 68. I have personally reviewed and interpreted this study.   Assessment / Plan: 1. Atrial fibrillation- paroxysmal- with slow ventricular response.  History of sinus bradycardia with second-degree Mobitz type I AV block.  CHAD-Vasc score of 3-4. On Eliquis for anticoagulation. s/p PPM. Pacer check in June was normal.   2. Hypertrophic cardiomyopathy. Echocardiogram in January 2016 is unchanged from 2013 and 2015.  She is asymptomatic.  Will continue HCTZ. Edema well controlled with support hose. Instructed on sodium restricted diet. Admits that she doesn't eat well at times.  3. 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 Asymptomatic.  4. Hypertension, elevated today. Recommend starting Toprol XL 25 mg daily. Continue HCTZ. Monitor BP at home.  5. Iron deficiency anemia with heme positive stool. Negative GI work up in past.  6. MGUS- followed yearly by hematology. No change.

## 2017-02-27 ENCOUNTER — Encounter: Payer: Self-pay | Admitting: Cardiology

## 2017-02-27 ENCOUNTER — Ambulatory Visit (INDEPENDENT_AMBULATORY_CARE_PROVIDER_SITE_OTHER): Payer: Medicare Other | Admitting: Cardiology

## 2017-02-27 VITALS — BP 164/88 | HR 68 | Ht 64.5 in | Wt 181.6 lb

## 2017-02-27 DIAGNOSIS — R001 Bradycardia, unspecified: Secondary | ICD-10-CM | POA: Diagnosis not present

## 2017-02-27 DIAGNOSIS — I482 Chronic atrial fibrillation, unspecified: Secondary | ICD-10-CM

## 2017-02-27 DIAGNOSIS — I421 Obstructive hypertrophic cardiomyopathy: Secondary | ICD-10-CM | POA: Diagnosis not present

## 2017-02-27 DIAGNOSIS — I10 Essential (primary) hypertension: Secondary | ICD-10-CM

## 2017-02-27 MED ORDER — METOPROLOL SUCCINATE ER 25 MG PO TB24: 25.0000 mg | ORAL_TABLET | Freq: Every day | ORAL | 3 refills | Status: DC

## 2017-02-27 NOTE — Patient Instructions (Signed)
We will add Toprol XL 25 mg daily  Continue your other therapy  I will see you in 6 months.

## 2017-02-27 NOTE — Addendum Note (Signed)
Addended by: Kathyrn Lass on: 02/27/2017 11:26 AM   Modules accepted: Orders

## 2017-05-02 ENCOUNTER — Telehealth: Payer: Self-pay | Admitting: Cardiology

## 2017-05-02 ENCOUNTER — Encounter: Payer: Medicare Other | Admitting: *Deleted

## 2017-05-02 NOTE — Telephone Encounter (Signed)
Spoke with pt and reminded pt of remote transmission that is due today. Pt verbalized understanding.   

## 2017-05-05 ENCOUNTER — Encounter: Payer: Self-pay | Admitting: Cardiology

## 2017-05-08 DIAGNOSIS — H04123 Dry eye syndrome of bilateral lacrimal glands: Secondary | ICD-10-CM | POA: Diagnosis not present

## 2017-05-08 DIAGNOSIS — H16103 Unspecified superficial keratitis, bilateral: Secondary | ICD-10-CM | POA: Diagnosis not present

## 2017-05-08 DIAGNOSIS — H5213 Myopia, bilateral: Secondary | ICD-10-CM | POA: Diagnosis not present

## 2017-05-08 DIAGNOSIS — H43813 Vitreous degeneration, bilateral: Secondary | ICD-10-CM | POA: Diagnosis not present

## 2017-05-09 ENCOUNTER — Telehealth: Payer: Self-pay | Admitting: Cardiology

## 2017-05-09 NOTE — Telephone Encounter (Signed)
Patient called and stated that she received a new [employer] [place] home monitor. She stated that the new monitor is doing the same thing that the old monitor was doing. She stated that she called [employer] last week and was on the phone for over an hour trying to trouble shoot the monitor and she does not want to do that again. Patient agreed to bring her home monitor and cell phone with her to an appt on Thursday 05-11-17 at 4:30 PM. She is aware that we will need to use her cellular data to send remote transmission.

## 2017-05-11 ENCOUNTER — Ambulatory Visit (INDEPENDENT_AMBULATORY_CARE_PROVIDER_SITE_OTHER): Payer: Self-pay | Admitting: *Deleted

## 2017-05-11 DIAGNOSIS — I482 Chronic atrial fibrillation, unspecified: Secondary | ICD-10-CM

## 2017-05-13 ENCOUNTER — Other Ambulatory Visit: Payer: Self-pay | Admitting: Cardiology

## 2017-05-15 ENCOUNTER — Other Ambulatory Visit: Payer: Self-pay | Admitting: Cardiology

## 2017-05-16 NOTE — Progress Notes (Signed)
Patient seen today for assistance sending home transmission. Succusfully walked patient through sending remote transmission via [employer].

## 2017-06-20 DIAGNOSIS — Z23 Encounter for immunization: Secondary | ICD-10-CM | POA: Diagnosis not present

## 2017-07-04 DIAGNOSIS — M25562 Pain in left knee: Secondary | ICD-10-CM | POA: Diagnosis not present

## 2017-07-05 ENCOUNTER — Telehealth: Payer: Self-pay | Admitting: Cardiology

## 2017-07-05 NOTE — Telephone Encounter (Signed)
Patient daughter called and stated that pt needed an MRI and wanted to make sure pt PPM is MRI safe. I informed pt daughter that according to our records the device is MRI safe.

## 2017-07-06 ENCOUNTER — Other Ambulatory Visit (HOSPITAL_COMMUNITY): Payer: Self-pay | Admitting: Orthopedic Surgery

## 2017-07-06 DIAGNOSIS — M25562 Pain in left knee: Secondary | ICD-10-CM

## 2017-07-14 ENCOUNTER — Ambulatory Visit (HOSPITAL_COMMUNITY)
Admission: RE | Admit: 2017-07-14 | Discharge: 2017-07-14 | Disposition: A | Discharge status: Home / Self Care | Payer: Medicare Other | Source: Ambulatory Visit | Attending: Orthopedic Surgery | Admitting: Orthopedic Surgery

## 2017-07-17 ENCOUNTER — Ambulatory Visit (HOSPITAL_COMMUNITY): Admission: RE | Admit: 2017-07-17 | Payer: Medicare Other | Source: Ambulatory Visit

## 2017-07-19 DIAGNOSIS — M25562 Pain in left knee: Secondary | ICD-10-CM | POA: Diagnosis not present

## 2017-07-24 DIAGNOSIS — M25562 Pain in left knee: Secondary | ICD-10-CM | POA: Diagnosis not present

## 2017-07-25 ENCOUNTER — Other Ambulatory Visit (HOSPITAL_COMMUNITY): Payer: Medicare Other

## 2017-07-25 DIAGNOSIS — M25562 Pain in left knee: Secondary | ICD-10-CM | POA: Diagnosis not present

## 2017-08-02 DIAGNOSIS — M25562 Pain in left knee: Secondary | ICD-10-CM | POA: Diagnosis not present

## 2017-08-04 DIAGNOSIS — M25562 Pain in left knee: Secondary | ICD-10-CM | POA: Diagnosis not present

## 2017-08-09 DIAGNOSIS — M25562 Pain in left knee: Secondary | ICD-10-CM | POA: Diagnosis not present

## 2017-08-10 ENCOUNTER — Telehealth: Payer: Self-pay | Admitting: Cardiology

## 2017-08-10 NOTE — Telephone Encounter (Signed)
Spoke with patient who is struggling to successfully send a remote transmission with her my carelink smart. She suggested that she come into the office for me to assist her. Patient was seen 05/11/17 for the same issue of which I spent over an hour attempting to obtain conection with the pacemaker and her monitor to succesfully send a transmission. I advised that we her history of issues with this home monitor that we had two options. 1) have [employer] call for assistance with trouble shooting or 2) order a [phone] to replace her mycarelink smart. Patient was agreeable to receiving a call from [employer] with the backup option of replacing her current monitor. Tech services request submitted by [place].

## 2017-08-10 NOTE — Telephone Encounter (Signed)
Patient called w/ concerns about her home monitor. Call routed to [place] RN b/c patient refused to talk to [employer].

## 2017-08-14 DIAGNOSIS — Z950 Presence of cardiac pacemaker: Secondary | ICD-10-CM | POA: Diagnosis not present

## 2017-08-14 DIAGNOSIS — M25562 Pain in left knee: Secondary | ICD-10-CM | POA: Diagnosis not present

## 2017-08-14 DIAGNOSIS — I517 Cardiomegaly: Secondary | ICD-10-CM | POA: Diagnosis not present

## 2017-08-14 DIAGNOSIS — E038 Other specified hypothyroidism: Secondary | ICD-10-CM | POA: Diagnosis not present

## 2017-08-14 DIAGNOSIS — I10 Essential (primary) hypertension: Secondary | ICD-10-CM | POA: Diagnosis not present

## 2017-08-14 DIAGNOSIS — Z6830 Body mass index (BMI) 30.0-30.9, adult: Secondary | ICD-10-CM | POA: Diagnosis not present

## 2017-08-15 DIAGNOSIS — M25562 Pain in left knee: Secondary | ICD-10-CM | POA: Diagnosis not present

## 2017-08-16 DIAGNOSIS — M25562 Pain in left knee: Secondary | ICD-10-CM | POA: Diagnosis not present

## 2017-08-18 DIAGNOSIS — M25562 Pain in left knee: Secondary | ICD-10-CM | POA: Diagnosis not present

## 2017-08-21 DIAGNOSIS — M25562 Pain in left knee: Secondary | ICD-10-CM | POA: Diagnosis not present

## 2017-08-23 ENCOUNTER — Ambulatory Visit (INDEPENDENT_AMBULATORY_CARE_PROVIDER_SITE_OTHER): Payer: Medicare Other | Admitting: *Deleted

## 2017-08-23 DIAGNOSIS — I441 Atrioventricular block, second degree: Secondary | ICD-10-CM | POA: Diagnosis not present

## 2017-08-24 NOTE — Progress Notes (Signed)
Remote pacemaker transmission.   

## 2017-08-25 ENCOUNTER — Encounter: Payer: Self-pay | Admitting: Cardiology

## 2017-08-25 DIAGNOSIS — M25562 Pain in left knee: Secondary | ICD-10-CM | POA: Diagnosis not present

## 2017-08-28 DIAGNOSIS — M25562 Pain in left knee: Secondary | ICD-10-CM | POA: Diagnosis not present

## 2017-09-01 ENCOUNTER — Other Ambulatory Visit: Payer: Self-pay

## 2017-09-01 ENCOUNTER — Encounter (HOSPITAL_COMMUNITY): Payer: Self-pay | Admitting: Emergency Medicine

## 2017-09-01 ENCOUNTER — Encounter (HOSPITAL_COMMUNITY)
Admission: EM | Disposition: A | Discharge status: Home / Self Care | Payer: Self-pay | Source: Home / Self Care | Attending: Emergency Medicine

## 2017-09-01 ENCOUNTER — Telehealth: Payer: Self-pay

## 2017-09-01 ENCOUNTER — Observation Stay (HOSPITAL_COMMUNITY)
Admission: EM | Admit: 2017-09-01 | Discharge: 2017-09-02 | Disposition: A | Discharge status: Home / Self Care | Payer: Medicare Other | Attending: Internal Medicine | Admitting: Internal Medicine

## 2017-09-01 DIAGNOSIS — Z950 Presence of cardiac pacemaker: Secondary | ICD-10-CM | POA: Insufficient documentation

## 2017-09-01 DIAGNOSIS — D472 Monoclonal gammopathy: Secondary | ICD-10-CM | POA: Insufficient documentation

## 2017-09-01 DIAGNOSIS — Z7901 Long term (current) use of anticoagulants: Secondary | ICD-10-CM | POA: Diagnosis not present

## 2017-09-01 DIAGNOSIS — E785 Hyperlipidemia, unspecified: Secondary | ICD-10-CM | POA: Insufficient documentation

## 2017-09-01 DIAGNOSIS — Y828 Other medical devices associated with adverse incidents: Secondary | ICD-10-CM | POA: Insufficient documentation

## 2017-09-01 DIAGNOSIS — Z79890 Hormone replacement therapy (postmenopausal): Secondary | ICD-10-CM | POA: Insufficient documentation

## 2017-09-01 DIAGNOSIS — E669 Obesity, unspecified: Secondary | ICD-10-CM | POA: Diagnosis not present

## 2017-09-01 DIAGNOSIS — E039 Hypothyroidism, unspecified: Secondary | ICD-10-CM | POA: Insufficient documentation

## 2017-09-01 DIAGNOSIS — Z6830 Body mass index (BMI) 30.0-30.9, adult: Secondary | ICD-10-CM | POA: Insufficient documentation

## 2017-09-01 DIAGNOSIS — I442 Atrioventricular block, complete: Secondary | ICD-10-CM | POA: Diagnosis not present

## 2017-09-01 DIAGNOSIS — T82110A Breakdown (mechanical) of cardiac electrode, initial encounter: Principal | ICD-10-CM

## 2017-09-01 DIAGNOSIS — I481 Persistent atrial fibrillation: Secondary | ICD-10-CM | POA: Diagnosis not present

## 2017-09-01 DIAGNOSIS — E7800 Pure hypercholesterolemia, unspecified: Secondary | ICD-10-CM | POA: Diagnosis not present

## 2017-09-01 DIAGNOSIS — Z79899 Other long term (current) drug therapy: Secondary | ICD-10-CM | POA: Diagnosis not present

## 2017-09-01 DIAGNOSIS — Z95818 Presence of other cardiac implants and grafts: Secondary | ICD-10-CM

## 2017-09-01 DIAGNOSIS — R42 Dizziness and giddiness: Secondary | ICD-10-CM | POA: Diagnosis not present

## 2017-09-01 DIAGNOSIS — I4891 Unspecified atrial fibrillation: Secondary | ICD-10-CM | POA: Diagnosis present

## 2017-09-01 DIAGNOSIS — M25562 Pain in left knee: Secondary | ICD-10-CM | POA: Diagnosis not present

## 2017-09-01 DIAGNOSIS — T82118A Breakdown (mechanical) of other cardiac electronic device, initial encounter: Secondary | ICD-10-CM | POA: Diagnosis not present

## 2017-09-01 DIAGNOSIS — I421 Obstructive hypertrophic cardiomyopathy: Secondary | ICD-10-CM | POA: Insufficient documentation

## 2017-09-01 DIAGNOSIS — I10 Essential (primary) hypertension: Secondary | ICD-10-CM | POA: Diagnosis not present

## 2017-09-01 HISTORY — PX: PACEMAKER REVISION: EP1221

## 2017-09-01 LAB — CUP PACEART REMOTE DEVICE CHECK
Battery Remaining Longevity: 98 mo
Battery Voltage: 3.02 V
Brady Statistic RV Percent Paced: 97.49 %
Implantable Lead Implant Date: 20170320
Lead Channel Impedance Value: 4047 Ohm
Lead Channel Impedance Value: 4047 Ohm
Lead Channel Pacing Threshold Amplitude: 0.625 V
Lead Channel Pacing Threshold Pulse Width: 0.4 ms
Lead Channel Sensing Intrinsic Amplitude: 9.125 mV
Lead Channel Sensing Intrinsic Amplitude: 9.125 mV
Lead Channel Setting Pacing Pulse Width: 0.4 ms
MDC IDC LEAD LOCATION: 753860
MDC IDC PG IMPLANT DT: 20170320
MDC IDC SESS DTM: 20190116211508
MDC IDC SET LEADCHNL RV PACING AMPLITUDE: 2 V
MDC IDC SET LEADCHNL RV SENSING SENSITIVITY: 2.8 mV

## 2017-09-01 LAB — BASIC METABOLIC PANEL
ANION GAP: 11 (ref 5–15)
BUN: 10 mg/dL (ref 6–20)
CHLORIDE: 101 mmol/L (ref 101–111)
CO2: 24 mmol/L (ref 22–32)
Calcium: 9.5 mg/dL (ref 8.9–10.3)
Creatinine, Ser: 0.94 mg/dL (ref 0.44–1.00)
GFR calc non Af Amer: 57 mL/min — ABNORMAL LOW (ref >60)
Glucose, Bld: 119 mg/dL — ABNORMAL HIGH (ref 65–99)
POTASSIUM: 3.5 mmol/L (ref 3.5–5.1)
Sodium: 136 mmol/L (ref 135–145)

## 2017-09-01 LAB — CBC
HEMATOCRIT: 40.9 % (ref 36.0–46.0)
HEMOGLOBIN: 13.9 g/dL (ref 12.0–15.0)
MCH: 32.3 pg (ref 26.0–34.0)
MCHC: 34 g/dL (ref 30.0–36.0)
MCV: 95.1 fL (ref 78.0–100.0)
Platelets: 289 10*3/uL (ref 150–400)
RBC: 4.3 MIL/uL (ref 3.87–5.11)
RDW: 13.7 % (ref 11.5–15.5)
WBC: 7.1 10*3/uL (ref 4.0–10.5)

## 2017-09-01 LAB — I-STAT TROPONIN, ED: Troponin i, poc: 0.02 ng/mL (ref 0.00–0.08)

## 2017-09-01 SURGERY — PACEMAKER REVISION
Anesthesia: LOCAL

## 2017-09-01 MED ORDER — VITAMIN B-12 1000 MCG PO TABS
1000.0000 ug | ORAL_TABLET | Freq: Every day | ORAL | Status: DC
  Administered 2017-09-02: 1000 ug via ORAL
  Filled 2017-09-01: qty 1

## 2017-09-01 MED ORDER — CEFAZOLIN SODIUM-DEXTROSE 1-4 GM/50ML-% IV SOLN
1.0000 g | Freq: Four times a day (QID) | INTRAVENOUS | Status: AC
  Administered 2017-09-01 – 2017-09-02 (×3): 1 g via INTRAVENOUS
  Filled 2017-09-01 (×4): qty 50

## 2017-09-01 MED ORDER — LEVOTHYROXINE SODIUM 25 MCG PO TABS
125.0000 ug | ORAL_TABLET | ORAL | Status: DC
  Administered 2017-09-02: 125 ug via ORAL
  Filled 2017-09-01: qty 1

## 2017-09-01 MED ORDER — FENTANYL CITRATE (PF) 100 MCG/2ML IJ SOLN
INTRAMUSCULAR | Status: DC | PRN
  Administered 2017-09-01 (×4): 25 ug via INTRAVENOUS

## 2017-09-01 MED ORDER — VITAMIN D 50 MCG (2000 UT) PO CAPS: 2000.0000 [IU] | ORAL_CAPSULE | Freq: Every day | ORAL | Status: DC

## 2017-09-01 MED ORDER — NITROGLYCERIN 0.4 MG SL SUBL: 0.4000 mg | SUBLINGUAL_TABLET | SUBLINGUAL | Status: DC | PRN

## 2017-09-01 MED ORDER — ONDANSETRON HCL 4 MG/2ML IJ SOLN
4.0000 mg | Freq: Four times a day (QID) | INTRAMUSCULAR | Status: DC | PRN
  Administered 2017-09-02: 4 mg via INTRAVENOUS
  Filled 2017-09-01: qty 2

## 2017-09-01 MED ORDER — HEPARIN (PORCINE) IN NACL 2-0.9 UNIT/ML-% IJ SOLN
INTRAMUSCULAR | Status: AC | PRN
  Administered 2017-09-01: 500 mL

## 2017-09-01 MED ORDER — APIXABAN 5 MG PO TABS: 5.0000 mg | ORAL_TABLET | Freq: Two times a day (BID) | ORAL | Status: DC

## 2017-09-01 MED ORDER — APIXABAN 5 MG PO TABS
5.0000 mg | ORAL_TABLET | Freq: Two times a day (BID) | ORAL | Status: DC
  Filled 2017-09-01: qty 1

## 2017-09-01 MED ORDER — ACETAMINOPHEN 325 MG PO TABS
325.0000 mg | ORAL_TABLET | ORAL | Status: DC | PRN
  Administered 2017-09-01 – 2017-09-02 (×2): 650 mg via ORAL
  Filled 2017-09-01 (×2): qty 2

## 2017-09-01 MED ORDER — FENTANYL CITRATE (PF) 100 MCG/2ML IJ SOLN
INTRAMUSCULAR | Status: AC
  Filled 2017-09-01: qty 2

## 2017-09-01 MED ORDER — HEPARIN (PORCINE) IN NACL 2-0.9 UNIT/ML-% IJ SOLN
INTRAMUSCULAR | Status: AC
  Filled 2017-09-01: qty 500

## 2017-09-01 MED ORDER — HYDROCHLOROTHIAZIDE 25 MG PO TABS
12.5000 mg | ORAL_TABLET | Freq: Every day | ORAL | Status: DC
  Administered 2017-09-02: 12.5 mg via ORAL
  Filled 2017-09-01: qty 1

## 2017-09-01 MED ORDER — CEFAZOLIN SODIUM-DEXTROSE 2-4 GM/100ML-% IV SOLN
INTRAVENOUS | Status: AC
  Filled 2017-09-01: qty 100

## 2017-09-01 MED ORDER — ONDANSETRON HCL 4 MG/2ML IJ SOLN: 4.0000 mg | Freq: Four times a day (QID) | INTRAMUSCULAR | Status: DC | PRN

## 2017-09-01 MED ORDER — ATORVASTATIN CALCIUM 40 MG PO TABS
40.0000 mg | ORAL_TABLET | Freq: Every day | ORAL | Status: DC
  Administered 2017-09-01: 40 mg via ORAL
  Filled 2017-09-01: qty 1

## 2017-09-01 MED ORDER — ACETAMINOPHEN 325 MG PO TABS: 650.0000 mg | ORAL_TABLET | ORAL | Status: DC | PRN

## 2017-09-01 MED ORDER — LIDOCAINE HCL (PF) 1 % IJ SOLN
INTRAMUSCULAR | Status: DC | PRN
Start: 2017-09-01 — End: 2017-09-01
  Administered 2017-09-01: 40 mL

## 2017-09-01 MED ORDER — METOPROLOL SUCCINATE ER 25 MG PO TB24
25.0000 mg | ORAL_TABLET | Freq: Every day | ORAL | Status: DC
  Administered 2017-09-02: 25 mg via ORAL
  Filled 2017-09-01: qty 1

## 2017-09-01 MED ORDER — LIDOCAINE HCL (PF) 1 % IJ SOLN
INTRAMUSCULAR | Status: AC
  Filled 2017-09-01: qty 60

## 2017-09-01 MED ORDER — VITAMIN D 1000 UNITS PO TABS
2000.0000 [IU] | ORAL_TABLET | Freq: Every day | ORAL | Status: DC
  Administered 2017-09-02: 2000 [IU] via ORAL
  Filled 2017-09-01: qty 2

## 2017-09-01 MED ORDER — LEVOTHYROXINE SODIUM 125 MCG PO TABS: 125.0000 ug | ORAL_TABLET | Freq: Every day | ORAL | Status: DC

## 2017-09-01 MED ORDER — IOPAMIDOL (ISOVUE-370) INJECTION 76%
INTRAVENOUS | Status: AC
  Filled 2017-09-01: qty 50

## 2017-09-01 MED ORDER — SODIUM CHLORIDE 0.9 % IR SOLN
80.0000 mg | Status: AC
  Administered 2017-09-01: 80 mg

## 2017-09-01 MED ORDER — SODIUM CHLORIDE 0.9 % IR SOLN
Status: AC
  Filled 2017-09-01: qty 2

## 2017-09-01 MED ORDER — CEFAZOLIN SODIUM-DEXTROSE 2-4 GM/100ML-% IV SOLN
2.0000 g | INTRAVENOUS | Status: AC
  Administered 2017-09-01: 2 g via INTRAVENOUS

## 2017-09-01 MED ORDER — SODIUM CHLORIDE 0.9 % IV SOLN
INTRAVENOUS | Status: AC | PRN
  Administered 2017-09-01: 50 mL/h via INTRAVENOUS

## 2017-09-01 SURGICAL SUPPLY — 10 items
CABLE SURGICAL S-101-97-12 (CABLE) ×1 IMPLANT
KIT MICROINTRODUCER STIFF 5F (SHEATH) ×1 IMPLANT
LEAD CAPSURE NOVUS 5076-58CM (Lead) ×1 IMPLANT
PAD DEFIB LIFELINK (PAD) ×1 IMPLANT
SHEATH CLASSIC 7F (SHEATH) ×2 IMPLANT
SHEATH PINNACLE 6F 10CM (SHEATH) ×1 IMPLANT
TRAY PACEMAKER INSERTION (PACKS) ×1 IMPLANT
WIRE AMPLATZ SS-J .035X260CM (WIRE) ×1 IMPLANT
WIRE HI TORQ VERSACORE-J 145CM (WIRE) ×1 IMPLANT
WIRE ROSEN-J .035X260CM (WIRE) ×1 IMPLANT

## 2017-09-01 NOTE — Telephone Encounter (Signed)
Spoke with pt informed her that she should go to the ED pt stated that someone was on they way to take her to the ED

## 2017-09-01 NOTE — H&P (Signed)
H&P   Patient ID: Tracey Mcclure; [id number]; 13-Jun-1941   Admit date: (Not on file) Date of Consult: 09/01/2017  Primary Care Provider: Crist Infante, MD Primary Cardiologist: Dr. Martinique Primary Electrophysiologist:  Dr. Lovena Le   Patient Profile:   Tracey Mcclure is a 77 y.o. female with a hx of HOCM, HTN, Persistent AFib, advanced AVblock, bradycardia w/PPM, MGUS, HLD  who is being admitted today for pacemaker lead revision secondary to RV lead fracture.  History of Present Illness:   Tracey Mcclure was found to have an alert status come through on her Carelink PPM monitor noting a high lead impedance, given she V paces 97% of the time she was referred to the ER for further evaluation.  The patient mentioned in late Nov she had a trip/fall accident and hurt her knee, she did not appreciate any chest wall trauma.  In the last couple weeks she has noted occasionally though very momentarily lightheaded.  No near syncope or syncope, no CP or SOB.  She had a cold type illness 3-4 weeks ago, though feels well since then.   The patient arrived to the ER driven by family, her PPM was interrogated and noted high V lead thresholds and easily reproducible and sensed noise with very minimal arm movement.  The pacer was reprogrammed VOO and planned for lead revision today.  Past Medical History:  Diagnosis Date  . Atrial fibrillation ([place]) 12/05/2013  . Colon adenoma   . Gastric mass 10/14/2013  . GERD (gastroesophageal reflux disease)   . HTN (hypertension)    CONTROLLED  . Hypercholesteremia   . Hypertr obst cardiomyop   . Hypothyroidism   . Iron deficiency anemia, unspecified 10/14/2013  . 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  . Mitral insufficiency   . 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  . Obesity   . Palpitations     Past Surgical History:  Procedure Laterality Date  . ABDOMINAL HYSTERECTOMY    . EP IMPLANTABLE DEVICE N/A 10/26/2015   Procedure: Pacemaker Implant;  Surgeon: Tracey Lance, MD;  Location: [place] [facility];  Service: Cardiovascular;  Laterality: N/A;  . OVARY SURGERY     BENIGN TUMOR       Inpatient Medications: Scheduled Meds:  Continuous Infusions:  PRN Meds:   Allergies:   No Known Allergies  Social History:   Social History   Socioeconomic History  . Marital status: Widowed    Spouse name: Not on file  . Number of children: 1  . Years of education: Not on file  . Highest education level: Not on file  Social Needs  . Financial resource strain: Not on file  . Food insecurity - worry: Not on file  . Food insecurity - inability: Not on file  . Transportation needs - medical: Not on file  . Transportation needs - non-medical: Not on file  Occupational History  . Occupation: Art gallery manager    Comment: retired  Tobacco Use  . Smoking status: Never Smoker  . Smokeless tobacco: Never Used  Substance and Sexual Activity  . Alcohol use: No  . Drug use: No  . Sexual activity: No  Other Topics Concern  . Not on file  Social History Narrative  . Not on file    Family History:    Family History  Problem Relation Age of Onset  . Heart attack Brother 2  . Prostate cancer Brother   . Breast cancer Sister 72  ROS:  Please see the history of present illness.  All other ROS reviewed and negative.     Physical Exam/Data:   Vitals:   09/01/17 1443  BP: (!) 174/98  Pulse: 78  Resp: 16  Temp: 98 F (36.7 C)  TempSrc: Oral  SpO2: 98%  Weight: 178 lb (80.7 kg)  Height: 5\' 5"  (1.651 m)   No intake or output data in the 24 hours ending 09/01/17 1459 Filed Weights   09/01/17 1443  Weight: 178 lb (80.7 kg)   Body mass index is 29.62 kg/m.  General:  Well nourished, well developed, in no acute distress HEENT: normal Lymph: no adenopathy Neck: no JVD Endocrine:  No thryomegaly Vascular: No carotid bruits  Cardiac:   RRR; no murmurs, gallops or rubs Lungs:  CTA b/l,  no  wheezing, rhonchi or rales  Abd: soft, nontender Ext: no edema Musculoskeletal:  No deformities Skin: warm and dry  Neuro:  No gross focal abnormalities noted Psych:  Normal affect   EKG:  The EKG was personally reviewed and demonstrates:   AF, V paced Telemetry:  Not yet on telemetry  Relevant CV Studies:  08/17/15: TTE Study Conclusions - Left ventricle: The cavity size was normal. There is severe basal   septal hypertrophy consistent with HCM and LVOT gradient at rest   30 mmHg. Systolic function was normal. Wall motion was normal;   there were no regional wall motion abnormalities. - Aortic valve: Trileaflet; moderately thickened, moderately   calcified leaflets. There was no regurgitation. - Aortic root: The aortic root was normal in size. - Mitral valve: Structurally normal valve. There was mild   regurgitation. - Left atrium: The atrium was severely dilated. - Right ventricle: Systolic function was normal. - Tricuspid valve: There was mild regurgitation. - Pulmonic valve: There was no regurgitation. - Pulmonary arteries: Systolic pressure was within the normal   range. PA peak pressure: 33 mm Hg (S). - Inferior vena cava: The vessel was normal in size. Impressions: - Findings consistent with hypertrophic cardiomyopathy. The patient   was in atrial flutter during the acquisition.  Laboratory Data:  ChemistryNo results for input(s): NA, K, CL, CO2, GLUCOSE, BUN, CREATININE, CALCIUM, GFRNONAA, GFRAA, ANIONGAP in the last 168 hours.  No results for input(s): PROT, ALBUMIN, AST, ALT, ALKPHOS, BILITOT in the last 168 hours. HematologyNo results for input(s): WBC, RBC, HGB, HCT, MCV, MCH, MCHC, RDW, PLT in the last 168 hours. Cardiac EnzymesNo results for input(s): TROPONINI in the last 168 hours. No results for input(s): TROPIPOC in the last 168 hours.  BNPNo results for input(s): BNP, PROBNP in the last 168 hours.  DDimer No results for input(s): DDIMER in the last 168  hours.  Radiology/Studies:  No results found.  Assessment and Plan:   1. PPM lead failure/fracture     97.3% V paced     Planned for lead revision today  Dr. Curt Bears has seen and examined the patient, discussed lead revision procedure, risks/benefits the patient is agreeable to proceed. The patient last ate about 12:30-1:00PM, Dr. Charmaine Downs discussed plans for local anesthesia, no sedation given she has eaten only a few hours ago, patient understands.  2. HTN      Continue home meds  3.  HOCM      Continue home meds  4. Hypothyroidism     Continue home synthroid   For questions or updates, please contact [place] Please consult [email or web address] for contact info under Cardiology/STEMI.   Signed, Baldwin Jamaica,  PA-C  09/01/2017 2:59 PM  I have seen and examined this patient with Tommye Standard.  Agree with above, note added to reflect my findings.  On exam, RRR, no murmurs, lungs clear.  Had a remote device transmission with impedance greater than 3000 ohms.  Presented to the emergency room with lead noise with movement of her right arm causing inhibition of pacing.  Likely due to lead fracture.  We Dannika Hilgeman plan for addition of a second RV lead today.  And benefits were discussed with the patient who agrees to the procedure.  Enisa Runyan M. Annella Prowell MD 09/01/2017 4:28 PM

## 2017-09-01 NOTE — ED Notes (Signed)
Cardiology PA at bedside. 

## 2017-09-01 NOTE — ED Notes (Signed)
Patient stated she would like to not sit on a stretcher, patient denies any symptoms at this time.  No chest pain or dizziness.  Patient placed on ED recliner chair with a break.  Monitors attached to patient HR 70 paced on monitor.

## 2017-09-01 NOTE — ED Notes (Signed)
Patient changed into gown awaiting labs to come back.  Warm blanket given and placed on cardiac monitors. Patient denies any chest pain at this time.

## 2017-09-01 NOTE — ED Triage Notes (Signed)
Pt told to come to ED by cardiologist Cristopher Peru, MD), who called her and told her she has a lead fracture in her pacemaker. [employer]. Supposed to meet with IR, who know she's coming. Pt reports intermittent dizziness, none at this time. Denies CP/SOB.

## 2017-09-01 NOTE — ED Provider Notes (Signed)
[facility] EMERGENCY DEPARTMENT Provider Note   CSN: [id number] Arrival date & time: 09/01/17  1435     History   Chief Complaint Chief Complaint  Patient presents with  . Pacemaker Problem    lead fracture    HPI  Tracey Mcclure is a 77 y.o. Female with a history of A. Fib, hypertension, hyperlipidemia, mitral insufficiency, and GERD who presents to the ED after a routine appointment with her cardiologist Dr. Cristopher Peru today.  Patient was found to have a lead fracture and her [employer] pacemaker.  Denies any chest pain or shortness of breath.  Patient reports today she noticed some very brief episodes of dizziness, denies any lightheadedness, weakness.  Reports some increased fatigue today after physical therapy, patient is currently undergoing physical therapy for meniscal tear of left knee. no abdominal pain fevers or chills.  Dr. Curt Bears and cardiology PA were expecting patient and met her in triage here in the emergency department.      Past Medical History:  Diagnosis Date  . Atrial fibrillation ([place]) 12/05/2013  . Colon adenoma   . Gastric mass 10/14/2013  . GERD (gastroesophageal reflux disease)   . HTN (hypertension)    CONTROLLED  . Hypercholesteremia   . Hypertr obst cardiomyop   . Hypothyroidism   . Iron deficiency anemia, unspecified 10/14/2013  . 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  . Mitral insufficiency   . 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  . Obesity   . Palpitations     Patient Active Problem List   Diagnosis Date Noted  . 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 Sinus bradycardia 03/30/2015  . Atrial fibrillation ([place]) 12/05/2013  . Gastric mass 10/14/2013  . 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  . 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  . Hypertrophic obstructive cardiomyopathy ([place])   . Mitral insufficiency   . HTN  (hypertension)   . Palpitations   . Hypercholesteremia   . Hypothyroidism   . GERD (gastroesophageal reflux disease)   . Obesity   . Colon adenoma     Past Surgical History:  Procedure Laterality Date  . ABDOMINAL HYSTERECTOMY    . EP IMPLANTABLE DEVICE N/A 10/26/2015   Procedure: Pacemaker Implant;  Surgeon: Evans Lance, MD;  Location: [facility];  Service: Cardiovascular;  Laterality: N/A;  . OVARY SURGERY     BENIGN TUMOR    OB History    No data available       Home Medications    Prior to Admission medications   Medication Sig Start Date End Date Taking? Authorizing Provider  acetaminophen (TYLENOL) 325 MG tablet Take 650 mg by mouth every 6 (six) hours as needed for mild pain or headache.     [provider]  atorvastatin (LIPITOR) 40 MG tablet Take 40 mg by mouth daily.    [provider]  Cholecalciferol (VITAMIN D) 2000 units CAPS Take 2,000 Units by mouth daily.     [provider]  DiphenhydrAMINE HCl (BENADRYL PO) Take 25 mg by mouth at bedtime as needed (for sleep).     [provider]  ELIQUIS 5 MG TABS tablet TAKE 1 TABLET (5 MG TOTAL) BY MOUTH 2 (TWO) TIMES DAILY. 05/15/17   Martinique, Peter M, MD  hydrochlorothiazide (HYDRODIURIL) 25 MG tablet Take 0.5 tablets (12.5 mg total) by mouth daily. 06/06/16   Martinique, Peter M, MD  hydrocortisone cream 0.5 % Apply 1 application  topically daily as needed for itching.    [provider]  levothyroxine (SYNTHROID, LEVOTHROID) 125 MCG tablet Take 125 mcg by mouth daily. Every day except Sundays.    [provider]  metoprolol succinate (TOPROL XL) 25 MG 24 hr tablet Take 1 tablet (25 mg total) by mouth daily. 02/27/17   Martinique, Peter M, MD  Probiotic Product (PROBIOTIC DAILY PO) Take 1 tablet by mouth daily.     [provider]  vitamin B-12 (CYANOCOBALAMIN) 1000 MCG tablet Take 1,000 mcg by mouth daily.    [provider]    Family  History Family History  Problem Relation Age of Onset  . Heart attack Brother 56  . Prostate cancer Brother   . Breast cancer Sister 27    Social History Social History   Tobacco Use  . Smoking status: Never Smoker  . Smokeless tobacco: Never Used  Substance Use Topics  . Alcohol use: No  . Drug use: No     Allergies   Patient has no known allergies.   Review of Systems Review of Systems  Constitutional: Negative for chills and fever.  HENT: Negative for congestion, rhinorrhea and sore throat.   Eyes: Negative for visual disturbance.  Respiratory: Negative for cough, chest tightness and shortness of breath.   Cardiovascular: Negative for chest pain, palpitations and leg swelling.  Gastrointestinal: Negative for abdominal pain, diarrhea, nausea and vomiting.  Genitourinary: Negative for dysuria.  Musculoskeletal: Negative for arthralgias and myalgias.  Skin: Negative for pallor and rash.  Neurological: Positive for dizziness. Negative for weakness, light-headedness, numbness and headaches.     Physical Exam Updated Vital Signs BP (!) 174/98 (BP Location: Right Arm)   Pulse 78   Temp 98 F (36.7 C) (Oral)   Resp 16   Ht _0  (1.651 m)   Wt 80.7 kg (178 lb)   LMP  (LMP Unknown)   SpO2 98%   BMI 29.62 kg/m   Physical Exam  Constitutional: She appears well-developed and well-nourished. No distress.  HENT:  Head: Normocephalic and atraumatic.  Eyes: EOM are normal. Pupils are equal, round, and reactive to light. Right eye exhibits no discharge. Left eye exhibits no discharge.  Neck: Neck supple.  Cardiovascular: Normal rate, regular rhythm, normal heart sounds and intact distal pulses.  Pulmonary/Chest: Effort normal and breath sounds normal. No stridor. No respiratory distress. She has no wheezes. She has no rales.  Abdominal: Soft. Bowel sounds are normal. She exhibits no distension and no mass. There is no tenderness. There is no guarding.  Musculoskeletal:  She exhibits no edema or deformity.  Neurological: She is alert. Coordination normal.  Speech is clear, able to follow commands CN III-XII intact Normal strength in upper and lower extremities bilaterally including dorsiflexion and plantar flexion, strong and equal grip strength Sensation normal to light and sharp touch Moves extremities without ataxia, coordination intact  Skin: Skin is warm and dry. Capillary refill takes less than 2 seconds. She is not diaphoretic.  Psychiatric: She has a normal mood and affect. Her behavior is normal.  Nursing note and vitals reviewed.    ED Treatments / Results  Labs (all labs ordered are listed, but only abnormal results are displayed) Labs Reviewed  BASIC METABOLIC PANEL - Abnormal; Notable for the following components:      Result Value   Glucose, Bld 119 (*)    GFR calc non Af Amer 57 (*)    All other components within normal limits  CBC  I-STAT TROPONIN, ED    EKG  EKG Interpretation None       Radiology No results found.  Procedures Procedures (including critical care time)  Medications Ordered in ED Medications  acetaminophen (TYLENOL) tablet 650 mg (not administered)  ondansetron (ZOFRAN) injection 4 mg (not administered)  nitroGLYCERIN (NITROSTAT) SL tablet 0.4 mg (not administered)  gentamicin (GARAMYCIN) 80 mg in sodium chloride irrigation 0.9 % 500 mL irrigation (not administered)  ceFAZolin (ANCEF) IVPB 2g/100 mL premix (not administered)  atorvastatin (LIPITOR) tablet 40 mg (not administered)  Vitamin D CAPS 2,000 Units (not administered)  hydrochlorothiazide (HYDRODIURIL) tablet 12.5 mg (not administered)  metoprolol succinate (TOPROL-XL) 24 hr tablet 25 mg (not administered)  vitamin B-12 (CYANOCOBALAMIN) tablet 1,000 mcg (not administered)  levothyroxine (SYNTHROID, LEVOTHROID) tablet 125 mcg (not administered)  apixaban (ELIQUIS) tablet 5 mg (not administered)     Initial Impression / Assessment and Plan  / ED Course  I have reviewed the triage vital signs and the nursing notes.  Pertinent labs & imaging results that were available during my care of the patient were reviewed by me and considered in my medical decision making (see chart for details).  Patient presents to the ED from her cardiologist's office, patient is met in triage by Dr. Arville Go and cardiology PA,  was found to have lead fracture of her pacemaker and will be admitted for repair of this.  She denies any chest pain or shortness of breath, has had some intermittent dizziness.  Some increased fatigue after physical therapy today.  On initial exam patient is hypertensive but vitals otherwise normal, patient is well-appearing with no focal findings on exam.  Spoke with Dr. Curt Bears at the bedside who will be admitting the patient to cardiology service and taking surgery today for right ventricular lead vision.  Labs pending and patient admitted to cardiology service.   Final Clinical Impressions(s) / ED Diagnoses   Final diagnoses:  Pacemaker lead fracture, initial encounter    ED Discharge Orders    None       Janet Berlin 09/01/17 1743    Milton Ferguson, MD 09/02/17 [phone]

## 2017-09-01 NOTE — Telephone Encounter (Signed)
Spoke with pt informed her that her pacemaker transmission indicated that her lead in her pacemaker may have fractured, asked pt if she had been having any passing out spells, or dizziness, pt stated that she fell in Nov and Dec and been having some dizzy spells but not passed out. Informed pt that I was going to call the EP doc and let him know and that pt would need someone to drive her to the ED.

## 2017-09-01 NOTE — H&P (Signed)
Tracey Mcclure has presented today for surgery, with the diagnosis of RV lead fracture.  The various methods of treatment have been discussed with the patient and family. After consideration of risks, benefits and other options for treatment, the patient has consented to  Procedure(s): RV lead revision as a surgical intervention .  Risks include but not limited to bleeding, tamponade, infection, pneumothorax, among others. The patient's history has been reviewed, patient examined, no change in status, stable for surgery.  I have reviewed the patient's chart and labs.  Questions were answered to the patient's satisfaction.    Keltie Labell Curt Bears, MD 09/01/2017 3:31 PM

## 2017-09-01 NOTE — ED Triage Notes (Signed)
Patient changing into gown and to be hooked up to monitor.

## 2017-09-01 NOTE — ED Notes (Signed)
Cath lab called and were ready to the patient.  Patient was transported to the cath lab by this RN on cardiac monitors.  All belongings were taken with patient to the cath lab.  Report given to cath team.  Patient tolerated transport well no complaints at this time.  No family at the hospital at this time.

## 2017-09-02 ENCOUNTER — Other Ambulatory Visit: Payer: Self-pay

## 2017-09-02 ENCOUNTER — Observation Stay (HOSPITAL_COMMUNITY): Payer: Medicare Other

## 2017-09-02 DIAGNOSIS — T82110D Breakdown (mechanical) of cardiac electrode, subsequent encounter: Secondary | ICD-10-CM

## 2017-09-02 DIAGNOSIS — I517 Cardiomegaly: Secondary | ICD-10-CM | POA: Diagnosis not present

## 2017-09-02 DIAGNOSIS — Z79899 Other long term (current) drug therapy: Secondary | ICD-10-CM | POA: Diagnosis not present

## 2017-09-02 DIAGNOSIS — Z950 Presence of cardiac pacemaker: Secondary | ICD-10-CM | POA: Diagnosis not present

## 2017-09-02 DIAGNOSIS — T82110A Breakdown (mechanical) of cardiac electrode, initial encounter: Secondary | ICD-10-CM | POA: Diagnosis not present

## 2017-09-02 DIAGNOSIS — I10 Essential (primary) hypertension: Secondary | ICD-10-CM | POA: Diagnosis not present

## 2017-09-02 DIAGNOSIS — Z7901 Long term (current) use of anticoagulants: Secondary | ICD-10-CM | POA: Diagnosis not present

## 2017-09-02 DIAGNOSIS — E039 Hypothyroidism, unspecified: Secondary | ICD-10-CM | POA: Diagnosis not present

## 2017-09-02 DIAGNOSIS — E669 Obesity, unspecified: Secondary | ICD-10-CM | POA: Diagnosis not present

## 2017-09-02 MED ORDER — APIXABAN 5 MG PO TABS: 5.0000 mg | ORAL_TABLET | Freq: Two times a day (BID) | ORAL | 1 refills | Status: DC

## 2017-09-02 NOTE — Care Management (Signed)
Pt states she is independent from home alone - a friend will stay with her at discharge.  Pt will transport home via private vehicle driven by daughter.  Pt informed CM that she has been on Eliquis for approximately 3 years - pt denied hardship paying for medication.  NO CM Needs determined at the time this note was written.  CM signing off

## 2017-09-02 NOTE — Progress Notes (Signed)
Progress Note  Patient Name: Tracey Mcclure Date of Encounter: 09/02/2017  Primary Cardiologist: Lovena Le   Subjective   No sob or chest pain. Some incisional soreness  Inpatient Medications    Scheduled Meds: . apixaban  5 mg Oral BID  . atorvastatin  40 mg Oral Q2000  . cholecalciferol  2,000 Units Oral Daily  . hydrochlorothiazide  12.5 mg Oral Daily  . levothyroxine  125 mcg Oral Once per day on Mon Tue Wed Thu Fri Sat  . metoprolol succinate  25 mg Oral Daily  . vitamin B-12  1,000 mcg Oral Daily   Continuous Infusions: .  ceFAZolin (ANCEF) IV Stopped (09/02/17 0522)   PRN Meds: acetaminophen, nitroGLYCERIN, ondansetron (ZOFRAN) IV   Vital Signs    Vitals:   09/01/17 1822 09/01/17 2102 09/02/17 0027 09/02/17 0528  BP: (!) 173/87 (!) 128/114 (!) 112/59 131/76  Pulse: 67 60 60 62  Resp: 12 17 13 19   Temp:  97.6 F (36.4 C) 98 F (36.7 C) (!) 97.3 F (36.3 C)  TempSrc:  Oral Oral Oral  SpO2: 100% 98% 97% 99%  Weight:    186 lb 1.1 oz (84.4 kg)  Height:        Intake/Output Summary (Last 24 hours) at 09/02/2017 0756 Last data filed at 09/02/2017 0425 Gross per 24 hour  Intake 460 ml  Output -  Net 460 ml   Filed Weights   09/01/17 1443 09/02/17 0528  Weight: 178 lb (80.7 kg) 186 lb 1.1 oz (84.4 kg)    Telemetry    Atrial fib with ventricular pacing - Personally Reviewed  ECG    none - Personally Reviewed  Physical Exam   GEN: No acute distress.   Neck: No JVD Cardiac: RRR, no murmurs, rubs, or gallops.  Respiratory: Clear to auscultation bilaterally. Very small hematoma GI: Soft, nontender, non-distended  MS: No edema; No deformity. Neuro:  Nonfocal  Psych: Normal affect   Labs    Chemistry Recent Labs  Lab 09/01/17 1501  NA 136  K 3.5  CL 101  CO2 24  GLUCOSE 119*  BUN 10  CREATININE 0.94  CALCIUM 9.5  GFRNONAA 57*  GFRAA >60  ANIONGAP 11     Hematology Recent Labs  Lab 09/01/17 1501  WBC 7.1  RBC 4.30  HGB 13.9    HCT 40.9  MCV 95.1  MCH 32.3  MCHC 34.0  RDW 13.7  PLT 289    Cardiac EnzymesNo results for input(s): TROPONINI in the last 168 hours.  Recent Labs  Lab 09/01/17 1508  TROPIPOC 0.02     BNPNo results for input(s): BNP, PROBNP in the last 168 hours.   DDimer No results for input(s): DDIMER in the last 168 hours.   Radiology    No results found.  Cardiac Studies   none  Patient Profile     77 y.o. female admitted with a fractured PM wire, s/p lead revision doing well this morning  Assessment & Plan    1. Atrial fib - her rate is well controlled. SHe will hold her eliquis for 2 days. 2. PPM lead fracture - she is s/p revision and doing well. She is stable for DC home. 3. PPM - her device is working normally. She has been interogated under my supervision. DC home with usual followup.   For questions or updates, please contact [facility] Please consult [email or web address] for contact info under Cardiology/STEMI.      Signed, Cristopher Peru, MD  09/02/2017, 7:56 AM  Patient ID: Tracey Mcclure, female   DOB: 31-Jan-1941, 77 y.o.   MRN: [id number]

## 2017-09-02 NOTE — Discharge Summary (Signed)
EP Discharge Summary    Patient ID: Tracey Mcclure,  MRN: [id number], DOB/AGE: 1941/07/28 77 y.o.  Admit date: 09/01/2017 Discharge date: 09/02/2017  Primary Care Provider: Crist Infante Primary Cardiologist: Peter Martinique, MD  Electrophysiologist: Dr. Cristopher Peru    Discharge Diagnoses    Principal Problem:   Pacemaker lead failure Active Problems:   Atrial fibrillation [facility])   Allergies No Known Allergies  Diagnostic Studies/Procedures    Pacemaker Revision 09/01/17 CONCLUSIONS:   1. Successful RV lead addition due to existing lead fracture  2. No early apparent complications.  _____________   History of Present Illness     Tracey Mcclure is a 77 y.o. female with a history of HOCM, persistent atrial fibrillation, advanced AV block status post pacemaker, hypertension, MGUS, hyperlipidemia.  Her remote monitoring sent an alert noting a high lead impedance.  As she is V paced 97% of the time, she was sent to the emergency room for evaluation.  Findings were consistent with lead fracture and she was set up for lead revision.  Hospital Course     Consultants: none   Patient was admitted and underwent successful RV lead revision by Dr. Curt Bears 09/01/17.  She had no immediate complications.  She was evaluated by Dr. Lovena Le this morning and is felt to be stable for discharge to home.  Her anticoagulation will be resumed on Monday, 09/04/17.  She will need usual follow-up with the device clinic and Dr. Lovena Le. _____________  Discharge Vitals Blood pressure 140/76, pulse 62, temperature 97.6 F (36.4 C), temperature source Oral, resp. rate 11, height 5\' 5"  (1.651 m), weight 186 lb 1.1 oz (84.4 kg), SpO2 98 %.  Filed Weights   09/01/17 1443 09/02/17 0528  Weight: 178 lb (80.7 kg) 186 lb 1.1 oz (84.4 kg)    Labs & Radiologic Studies    CBC Recent Labs    09/01/17 1501  WBC 7.1  HGB 13.9  HCT 40.9  MCV 95.1  PLT 353   Basic Metabolic Panel Recent Labs     09/01/17 1501  NA 136  K 3.5  CL 101  CO2 24  GLUCOSE 119*  BUN 10  CREATININE 0.94  CALCIUM 9.5   _____________  Dg Chest 2 View  Result Date: 09/02/2017 CLINICAL DATA:  Postop chest x-ray EXAM: CHEST  2 VIEW COMPARISON:  October 27, 2015 FINDINGS: Mild cardiomegaly. Stable pacemaker. No pneumothorax. The cardiomediastinal silhouette, lungs, and pleura are unremarkable. IMPRESSION: No active cardiopulmonary disease. Electronically Signed   By: Dorise Bullion III M.D   On: 09/02/2017 07:55   Disposition   Pt is being discharged home today in good condition.  Follow-up Plans & Appointments    Follow-up Information    [facility] Follow up in 10 day(s).   Specialty:  Cardiology Why:  The office should contact you to arrange a wound check 10 days after discharge. Contact information: [address] [place]       Evans Lance, MD Follow up in 3 month(s).   Specialty:  Cardiology Why:  The office will call to arrange a 3 month follow up with Dr. Lonzo Candy information: [id number] [phone]          Discharge Instructions    Diet - low sodium heart healthy   Complete by:  As directed    Discharge instructions   Complete by:  As  directed    See Supplemental Sheet   Discharge wound care:   Complete by:  As directed    See supplemental sheet   Driving Restrictions   Complete by:  As directed    See supplemental sheet - no driving for 2 weeks   Increase activity slowly   Complete by:  As directed    See Supplemental Sheet   Lifting restrictions   Complete by:  As directed    See supplemental sheet      Discharge Medications   Allergies as of 09/02/2017   No Known Allergies     Medication List    TAKE these medications   acetaminophen 325 MG tablet Commonly known as:  TYLENOL Take 650 mg by mouth every 6 (six) hours as needed for  mild pain or headache.   apixaban 5 MG Tabs tablet Commonly known as:  ELIQUIS Take 1 tablet (5 mg total) by mouth 2 (two) times daily. Start taking on:  09/04/2017 What changed:  These instructions start on 09/04/2017. If you are unsure what to do until then, ask your doctor or other care provider.   atorvastatin 40 MG tablet Commonly known as:  LIPITOR Take 40 mg by mouth at bedtime.   BENADRYL PO Take 25 mg by mouth at bedtime as needed (for sleep).   hydrochlorothiazide 25 MG tablet Commonly known as:  HYDRODIURIL Take 0.5 tablets (12.5 mg total) by mouth daily.   hydrocortisone cream 0.5 % Apply 1 application topically daily as needed for itching.   levothyroxine 125 MCG tablet Commonly known as:  SYNTHROID, LEVOTHROID Take 125 mcg by mouth daily. Every day except Sundays.   metoprolol succinate 25 MG 24 hr tablet Commonly known as:  TOPROL XL Take 1 tablet (25 mg total) by mouth daily.   PROBIOTIC DAILY PO Take 1 tablet by mouth daily.   vitamin B-12 1000 MCG tablet Commonly known as:  CYANOCOBALAMIN Take 1,000 mcg by mouth daily.   Vitamin D 2000 units Caps Take 2,000 Units by mouth daily.            Discharge Care Instructions  (From admission, onward)        Start     Ordered   09/02/17 0000  Discharge wound care:    Comments:  See supplemental sheet   09/02/17 0857        Outstanding Labs/Studies   None   Duration of Discharge Encounter   Greater than 30 minutes including physician time.  Signed, Richardson Dopp, PA-C  09/02/2017, 8:57 AM   EP Attending  Patient seen and examined. Agree with above. PM interogation under my direct supervision demonstrated normal device function. CXR shows no PTX. She will be discharged home with the usual followup.  Mikle Bosworth.D.

## 2017-09-02 NOTE — Plan of Care (Signed)
Pt oriented to room, plan of care, and method of reporting concerns. Verbalizes understanding of need to call for assistance prior to ambulation when necessary. Call light and bedside table within reach

## 2017-09-02 NOTE — Progress Notes (Signed)
Discharge report reviewed with patient.  Patient verbilized understanding of all instructions.  All belongings including 2 rings and cell phone were with patient upon discharge.  Patient left stable.

## 2017-09-02 NOTE — Discharge Instructions (Signed)
°**  DO NOT TAKE Eliquis until Monday 09/04/17**       Supplemental Discharge Instructions for  Pacemaker/Defibrillator Patients  Activity No heavy lifting or vigorous activity with your left arm for 6 to 8 weeks.  Do not raise your left arm above your head for one week.  Gradually raise your affected arm as drawn below.                 NO DRIVING for  2 weeks    ; you may begin driving on    01/09/00      . WOUND CARE - Keep the wound area clean and dry.  Do not get this area wet for one week. No showers for one week; you may shower on   09/08/17           . - The tape/steri-strips on your wound will fall off; do not pull them off.  No bandage is needed on the site.  DO  NOT apply any creams, oils, or ointments to the wound area. - If you notice any drainage or discharge from the wound, any swelling or bruising at the site, or you develop a fever > 101? F after you are discharged home, call the office at once.  Special Instructions - You are still able to use cellular telephones; use the ear opposite the side where you have your pacemaker/defibrillator.  Avoid carrying your cellular phone near your device. - When traveling through airports, show security personnel your identification card to avoid being screened in the metal detectors.  Ask the security personnel to use the hand wand. - Avoid arc welding equipment, MRI testing (magnetic resonance imaging), TENS units (transcutaneous nerve stimulators).  Call the office for questions about other devices. - Avoid electrical appliances that are in poor condition or are not properly grounded. - Microwave ovens are safe to be near or to operate.  Additional information for defibrillator patients should your device go off: - If your device goes off ONCE and you feel fine afterward, notify the device clinic nurses. - If your device goes off ONCE and you do not feel well afterward, call 911. - If your device goes off TWICE, call 911. - If your  device goes off THREE times in one day, call 911.  DO NOT DRIVE YOURSELF OR A FAMILY MEMBER WITH A DEFIBRILLATOR TO THE HOSPITAL--CALL 911.

## 2017-09-03 ENCOUNTER — Encounter (HOSPITAL_COMMUNITY): Payer: Self-pay | Admitting: Cardiology

## 2017-09-14 ENCOUNTER — Ambulatory Visit (INDEPENDENT_AMBULATORY_CARE_PROVIDER_SITE_OTHER): Payer: Medicare Other | Admitting: *Deleted

## 2017-09-14 DIAGNOSIS — I441 Atrioventricular block, second degree: Secondary | ICD-10-CM | POA: Diagnosis not present

## 2017-09-14 NOTE — Patient Instructions (Signed)
(  [phone] 

## 2017-09-15 ENCOUNTER — Ambulatory Visit: Payer: Medicare Other | Admitting: *Deleted

## 2017-09-15 DIAGNOSIS — Z45018 Encounter for adjustment and management of other part of cardiac pacemaker: Secondary | ICD-10-CM

## 2017-09-15 DIAGNOSIS — R001 Bradycardia, unspecified: Secondary | ICD-10-CM

## 2017-09-15 MED ORDER — CEPHALEXIN 500 MG PO CAPS: 500.0000 mg | ORAL_CAPSULE | Freq: Three times a day (TID) | ORAL | 0 refills | Status: AC

## 2017-09-15 NOTE — Progress Notes (Signed)
Pt seen d/t bleeding at pacemaker incision site. Hematoma noted on left side of incision site with small pinhole and able to express a small amount of dark red blood. Site assessed by Dr. Caryl Comes, recommendations to apply steri-strips/gauze and for pt to place warm compresses over site 2 x daily. Pt also to hold Eliquis, start Keflex and ROV 07/19/18 with device clinic apt while GT in office. Pt voiced understanding of instructions printed on AVS.

## 2017-09-15 NOTE — Patient Instructions (Addendum)
Hold Eliquis until 09/19/17 appointment. Start Keflex (antibiotic) 3 x daily for 7 days. Apply warm compresses 2 x daily and apply gauze as needed if incision site bleeds. Keep dressing dry until wound re-check on 09/19/17 at 3:00pm. If site starts bleeding copiously go to ED over the weekend.

## 2017-09-16 LAB — CUP PACEART INCLINIC DEVICE CHECK
Battery Remaining Longevity: 92 mo
Battery Voltage: 3 V
Implantable Lead Implant Date: 20170320
Implantable Lead Location: 753860
Implantable Lead Model: 5076
Implantable Pulse Generator Implant Date: 20170320
Lead Channel Impedance Value: 456 Ohm
Lead Channel Pacing Threshold Amplitude: 0.75 V
Lead Channel Pacing Threshold Pulse Width: 0.4 ms
Lead Channel Setting Pacing Pulse Width: 0.4 ms
Lead Channel Setting Sensing Sensitivity: 2 mV
MDC IDC MSMT LEADCHNL RV IMPEDANCE VALUE: 532 Ohm
MDC IDC MSMT LEADCHNL RV SENSING INTR AMPL: 15 mV
MDC IDC SESS DTM: 20190207151755
MDC IDC SET LEADCHNL RV PACING AMPLITUDE: 3.5 V
MDC IDC STAT BRADY RV PERCENT PACED: 97.78 %

## 2017-09-16 NOTE — Progress Notes (Signed)
Wound check appointment s/p lead revision. Steri-strips removed. Wound without redness or drainage. Slight purple to yellow ecchymosis noted on left side of incision, yellow ecchymosis noted around incision, no edema, soft to palpate. Incision edges approximated, wound well healed. Patient educated to call [facility] for signs and symptoms of infection or active bleeding.  Normal device function. Threshold, sensing, and impedance consistent with implant measurements. Device programmed at 3.5V for extra safety margin until 3 month visit. Histogram distribution appropriate for patient and level of activity. No high ventricular rates noted. Patient educated about wound care, arm mobility, lifting restrictions. ROV with GT 12/01/17

## 2017-09-17 NOTE — Progress Notes (Signed)
Tracey Mcclure Date of Birth: 09-Dec-1940   History of Present Illness: Tracey Mcclure is seen today for followup of atrial fibrillation and bradycardia. She has a history of hypertrophic obstructive cardiomyopathy, hypertension, and Mobitz type I second-degree AV block. When seen in April 2015 she was noted to be in atrial fibrillation with a slow ventricular response of 53. She had only mild palpitations. Verapamil was discontinued. 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 Prior extensive GI work up for heme positive stool and iron deficiency anemia was unrevealing.   She had a Holter monitor in august 2016 that showed sinus rhythm with PACs and PVCs. Some intermittent second degree AV block. Mean HR 65 with slowest HR 31 and longest pause of 3.1 seconds. She was seen by Rosaria Ferries PA. At that time Ecg showed AFib with rate 43 bpm. She complained of some intermittent edema and fatigue.  She was seen by Dr. Lovena Le and underwent PPM placement in March 2017.  In January she presented with a lead fracture and had lead revision on 09/01/17. Following this there was some swelling at the surgery site and she was placed on antibiotics and Eliquis has been on hold.   Prior to her lead fracture she tore something in her left knee and was getting physical therapy.   On follow up today she states she is doing OK.  Notes her stamina is not as good. Wonders if it will ever get better. No fever or chills. No dyspnea or chest pain.  Current Outpatient Medications on File Prior to Visit  Medication Sig Dispense Refill  . acetaminophen (TYLENOL) 325 MG tablet Take 650 mg by mouth every 6 (six) hours as needed for mild pain or headache.     [name] Kitchen apixaban (ELIQUIS) 5 MG TABS tablet Take 1 tablet (5 mg total) by mouth 2 (two) times daily. 180 tablet 1  . atorvastatin (LIPITOR) 40 MG tablet Take 40 mg by mouth at bedtime.     . cephALEXin (KEFLEX) 500 MG capsule Take 1 capsule (500 mg total) by mouth 3 (three)  times daily for 7 days. 21 capsule 0  . Cholecalciferol (VITAMIN D) 2000 units CAPS Take 2,000 Units by mouth daily.     . DiphenhydrAMINE HCl (BENADRYL PO) Take 25 mg by mouth at bedtime as needed (for sleep).     . hydrochlorothiazide (HYDRODIURIL) 25 MG tablet Take 0.5 tablets (12.5 mg total) by mouth daily. 90 tablet 2  . hydrocortisone cream 0.5 % Apply 1 application topically daily as needed for itching.    . levothyroxine (SYNTHROID, LEVOTHROID) 125 MCG tablet Take 125 mcg by mouth daily. Every day except Sundays.    . metoprolol succinate (TOPROL XL) 25 MG 24 hr tablet Take 1 tablet (25 mg total) by mouth daily. 90 tablet 3  . Probiotic Product (PROBIOTIC DAILY PO) Take 1 tablet by mouth daily.     . vitamin B-12 (CYANOCOBALAMIN) 1000 MCG tablet Take 1,000 mcg by mouth daily.     No current facility-administered medications on file prior to visit.     No Known Allergies  Past Medical History:  Diagnosis Date  . Atrial fibrillation ([place]) 12/05/2013  . Colon adenoma   . Gastric mass 10/14/2013  . GERD (gastroesophageal reflux disease)   . HTN (hypertension)    CONTROLLED  . Hypercholesteremia   . Hypertr obst cardiomyop   . Hypothyroidism   . Iron deficiency anemia, unspecified 10/14/2013  . MGUS (monoclonal gammopathy of unknown significance)  10/14/2013  . Mitral insufficiency   . 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  . Obesity   . Palpitations     Past Surgical History:  Procedure Laterality Date  . ABDOMINAL HYSTERECTOMY    . EP IMPLANTABLE DEVICE N/A 10/26/2015   Procedure: Pacemaker Implant;  Surgeon: Evans Lance, MD;  Location: [facility];  Service: Cardiovascular;  Laterality: N/A;  . OVARY SURGERY     BENIGN TUMOR  . PACEMAKER REVISION N/A 09/01/2017   Procedure: PACEMAKER REVISION;  Surgeon: Constance Haw, MD;  Location: [facility];  Service: Cardiovascular;  Laterality: N/A;    Social History   Tobacco Use   Smoking Status Never Smoker  Smokeless Tobacco Never Used    Social History   Substance and Sexual Activity  Alcohol Use No    Family History  Problem Relation Age of Onset  . Heart attack Brother 28  . Prostate cancer Brother   . Breast cancer Sister 42    Review of Systems: As noted in history of present illness.All other systems were reviewed and are negative.  Physical Exam: BP (!) 156/86   Pulse 77   Ht 5\' 5"  (1.651 m)   Wt 178 lb (80.7 kg)   LMP  (LMP Unknown)   SpO2 99%   BMI 29.62 kg/m  GENERAL:  Well appearing overweight WF in NAD HEENT:  PERRL, EOMI, sclera are clear. Oropharynx is clear. NECK:  No jugular venous distention, carotid upstroke brisk and symmetric, no bruits, no thyromegaly or adenopathy LUNGS:  Clear to auscultation bilaterally CHEST:  Unremarkable. Pacer site looks OK.  HEART:  RRR,  PMI not displaced or sustained,S1 and S2 within normal limits, no S3, no S4: no clicks, no rubs, gr 2-5/0 systolic murmur across the precordium.  ABD:  Soft, nontender. BS +, no masses or bruits. No hepatomegaly, no splenomegaly EXT:  2 + pulses throughout, no edema, no cyanosis no clubbing SKIN:  Warm and dry.  No rashes NEURO:  Alert and oriented x 3. Cranial nerves II through XII intact. PSYCH:  Cognitively intact    LABORATORY DATA: Lab Results  Component Value Date   WBC 7.1 09/01/2017   HGB 13.9 09/01/2017   HCT 40.9 09/01/2017   PLT 289 09/01/2017   GLUCOSE 119 (H) 09/01/2017   ALT 14 02/10/2017   AST 21 02/10/2017   NA 136 09/01/2017   K 3.5 09/01/2017   CL 101 09/01/2017   CREATININE 0.94 09/01/2017   BUN 10 09/01/2017   CO2 24 09/01/2017   Labs dated 11/30/15: cholesterol 165, triglycerides 97, LDL 76, HDL 70. BMET and TSH normal.  Dated 12/19/16: cholesterol 148, triglycerides 90, HDL 65, LDL 65. BMET and TSH normal. A1c 5.5%.    Assessment / Plan: 1. Atrial fibrillation- paroxysmal- with slow ventricular response.  History of sinus  bradycardia with second-degree Mobitz type I AV block.  CHAD-Vasc score of 3-4. On Eliquis for anticoagulation- currently on hold for pacer site to heal. s/p PPM- revised in January for lead fracture.   2. Hypertrophic cardiomyopathy. Echocardiogram in January 2016 is unchanged from 2013 and 2015.  She is asymptomatic.  Will continue HCTZ. Edema well controlled with support hose. Instructed on sodium restricted diet.   3. 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 Asymptomatic.  4. Hypertension, continue current Rx.   5. Iron deficiency anemia with heme positive stool. Negative GI work up in past. Last Hgb 13.9.   6.  MGUS- followed yearly by hematology. No change.

## 2017-09-18 ENCOUNTER — Telehealth: Payer: Self-pay | Admitting: Internal Medicine

## 2017-09-18 DIAGNOSIS — M25562 Pain in left knee: Secondary | ICD-10-CM | POA: Diagnosis not present

## 2017-09-18 NOTE — Telephone Encounter (Signed)
New message  Ronalee Belts PA [facility] verbalized that he is calling call for RN  To give a call back to discuss pt care

## 2017-09-18 NOTE — Telephone Encounter (Signed)
Returned call to [employer], spoke with Wells Guiles for [place] PA.  Wells Guiles asking about Pt resuming PT.  Faxed discharge instructions given to Patient's after new lead placement to [facility] to follow with resuming PT. Tamera Punt nurse for information. No further action needed at this time.

## 2017-09-19 ENCOUNTER — Ambulatory Visit (INDEPENDENT_AMBULATORY_CARE_PROVIDER_SITE_OTHER): Payer: Self-pay | Admitting: *Deleted

## 2017-09-19 ENCOUNTER — Telehealth: Payer: Self-pay

## 2017-09-19 DIAGNOSIS — R001 Bradycardia, unspecified: Secondary | ICD-10-CM

## 2017-09-19 NOTE — Progress Notes (Signed)
Wound re-check, dressing removed, scab intact of left incision site, healing stages of bruising noted. Small area around scab on left incision site purple/red and remainder of device site greenish- yellow bruising. Site assessed by GT and SK. GT recommended holding Eliquis until OV on 09/29/17.

## 2017-09-19 NOTE — Patient Instructions (Signed)
Continue holding Eliquis until 09/29/17 office visit.  continue warm compresses. Call [phone]  if site starts to bleed or increase in swelling.

## 2017-09-19 NOTE — Telephone Encounter (Signed)
Called pt to see if she could come in this afternoon at 2:00pm or 2:30pm for wound re-check, pt stated that she would have to check with her daughter because she was bringing her to the apt. Pt stated that she would call back.

## 2017-09-20 ENCOUNTER — Ambulatory Visit (INDEPENDENT_AMBULATORY_CARE_PROVIDER_SITE_OTHER): Payer: Medicare Other | Admitting: Cardiology

## 2017-09-20 ENCOUNTER — Encounter: Payer: Self-pay | Admitting: Cardiology

## 2017-09-20 VITALS — BP 156/86 | HR 77 | Ht 65.0 in | Wt 178.0 lb

## 2017-09-20 DIAGNOSIS — I10 Essential (primary) hypertension: Secondary | ICD-10-CM

## 2017-09-20 DIAGNOSIS — I482 Chronic atrial fibrillation, unspecified: Secondary | ICD-10-CM

## 2017-09-20 DIAGNOSIS — I421 Obstructive hypertrophic cardiomyopathy: Secondary | ICD-10-CM

## 2017-09-20 NOTE — Patient Instructions (Signed)
Hypertrophic Cardiomyopathy Hypertrophic cardiomyopathy (HCM) is a heart condition in which part of the heart muscle gets too thick. The condition can cause a dangerous and abnormal heart rhythm. It can also weaken the heart over time. The heart is divided into four chambers. The thickening usually affects the pumping chamber on the lower left (left ventricle). HCM may also affect the valve that lets blood flow out of the left ventricle (mitral valve). Symptoms often begin at about age 77. What are the causes? This condition is usually caused by abnormal genes that control heart muscle growth. These abnormal genes are passed down through families (are inherited). What increases the risk? This condition is more likely to develop in people with a family history of HCM. What are the signs or symptoms? Symptoms of this condition include:  Shortness of breath, especially after exercising or lying down.  Chest pain.  Dizziness.  Fatigue.  Irregular or fast heart rate.  Fainting, especially after physical activity.  How is this diagnosed? This condition is diagnosed based on the results of a physical exam that involves checking for an abnormal heart sound (heart murmur). Tests may be done, including:  An electrocardiogram (ECG). This test records your heart's electrical activity.  An echocardiogram. This test can show whether your left ventricle is enlarged and whether it fills slowly.  A Doppler test. This test shows irregular blood flow and pressure differences inside the heart.  A chest X-ray. This test can show whether your heart is enlarged.  An MRI.  How is this treated? Treatment for HCM depends on how severe your symptoms are. There are several options for treatment, including.  Medicine. Medicines can be given to: ? Reduce the workload of your heart. ? Lower your blood pressure. ? Thin your blood and prevent clots.  A device. Devices that can be used to treat this  condition include: ? A pacemaker. This device helps to control your heartbeat. ? A defibrillator. This device restores a normal heart rhythm.  Surgery. This may include a procedure to: ? Inject alcohol into the small blood vessels that supply your heart muscle (alcohol septal ablation). This is done during a procedure called cardiac catheterization. The goal is to cause the muscle to become thinner. ? Remove part of the wall that divides the right and left sides of the heart (septum). ? Replace the mitral valve.  Follow these instructions at home:  Avoid strenuous exercise and activities, like heavy lifting or shoveling snow.  Make sure the members of your household know how to do CPR in case of an emergency.  Take over-the-counter and prescription medicines only as told by your health care provider.  Follow a healthy diet and maintain a healthy weight. If you need help with losing weight, ask your health care provider.  Limit alcohol intake to no more than 1 drink per day for nonpregnant women and 2 drinks per day for men. One drink equals 12 oz of beer, 5 oz of wine, or 1 oz of hard liquor.  Do not use any products that contain nicotine or tobacco, such as cigarettes and e-cigarettes. If you need help quitting, ask your health care provider.  Keep all follow-up visits as directed by your health care provider. This is important. Contact a health care provider if:  You have new symptoms.  Your symptoms get worse. Get help right away if:  You have chest pain or shortness of breath, especially during or after sports.  You feel faint or you  pass out.  You have trouble breathing even at rest.  Your feet or ankles swell.  Your heartbeat seems irregular or seems faster than normal (palpitations).  Your heartbeat seems abnormal. This information is not intended to replace advice given to you by your health care provider. Make sure you discuss any questions you have with your  health care provider. Document Released: 06/30/2004 Document Revised: 06/17/2016 Document Reviewed: 06/17/2016 Elsevier Interactive Patient Education  2018 [employer].

## 2017-09-25 DIAGNOSIS — M25562 Pain in left knee: Secondary | ICD-10-CM | POA: Diagnosis not present

## 2017-09-29 ENCOUNTER — Ambulatory Visit (INDEPENDENT_AMBULATORY_CARE_PROVIDER_SITE_OTHER): Payer: Self-pay | Admitting: *Deleted

## 2017-09-29 DIAGNOSIS — I441 Atrioventricular block, second degree: Secondary | ICD-10-CM

## 2017-09-29 NOTE — Progress Notes (Signed)
Wound re-check, incision site intact, no swelling, bruising improving. Sit assessed by GT per GT pt to restart Eliquis tonight. Pt voiced understanding to restart eliquis.

## 2017-10-03 DIAGNOSIS — M25562 Pain in left knee: Secondary | ICD-10-CM | POA: Diagnosis not present

## 2017-10-05 DIAGNOSIS — M25562 Pain in left knee: Secondary | ICD-10-CM | POA: Diagnosis not present

## 2017-10-06 ENCOUNTER — Telehealth: Payer: Self-pay

## 2017-10-06 NOTE — Telephone Encounter (Signed)
Left message for Anderson Malta with [facility].  Notified that per Dr. Lovena Le Pt may return to PT.  Notified to call office if anything further needed (letter or fax).

## 2017-10-09 DIAGNOSIS — M25562 Pain in left knee: Secondary | ICD-10-CM | POA: Diagnosis not present

## 2017-10-13 DIAGNOSIS — M25562 Pain in left knee: Secondary | ICD-10-CM | POA: Diagnosis not present

## 2017-10-16 DIAGNOSIS — M25562 Pain in left knee: Secondary | ICD-10-CM | POA: Diagnosis not present

## 2017-10-18 DIAGNOSIS — M25562 Pain in left knee: Secondary | ICD-10-CM | POA: Diagnosis not present

## 2017-10-23 DIAGNOSIS — M25562 Pain in left knee: Secondary | ICD-10-CM | POA: Diagnosis not present

## 2017-10-30 DIAGNOSIS — M25562 Pain in left knee: Secondary | ICD-10-CM | POA: Diagnosis not present

## 2017-11-06 DIAGNOSIS — M1712 Unilateral primary osteoarthritis, left knee: Secondary | ICD-10-CM | POA: Diagnosis not present

## 2017-11-08 DIAGNOSIS — M25562 Pain in left knee: Secondary | ICD-10-CM | POA: Diagnosis not present

## 2017-11-13 DIAGNOSIS — M1712 Unilateral primary osteoarthritis, left knee: Secondary | ICD-10-CM | POA: Diagnosis not present

## 2017-11-14 DIAGNOSIS — M25562 Pain in left knee: Secondary | ICD-10-CM | POA: Diagnosis not present

## 2017-11-19 ENCOUNTER — Other Ambulatory Visit: Payer: Self-pay | Admitting: Cardiology

## 2017-11-20 DIAGNOSIS — M25562 Pain in left knee: Secondary | ICD-10-CM | POA: Diagnosis not present

## 2017-11-20 DIAGNOSIS — M1712 Unilateral primary osteoarthritis, left knee: Secondary | ICD-10-CM | POA: Diagnosis not present

## 2017-11-21 ENCOUNTER — Encounter: Payer: Self-pay | Admitting: Internal Medicine

## 2017-11-22 ENCOUNTER — Ambulatory Visit (INDEPENDENT_AMBULATORY_CARE_PROVIDER_SITE_OTHER): Payer: Medicare Other | Admitting: *Deleted

## 2017-11-22 DIAGNOSIS — I441 Atrioventricular block, second degree: Secondary | ICD-10-CM | POA: Diagnosis not present

## 2017-11-23 ENCOUNTER — Encounter: Payer: Self-pay | Admitting: Cardiology

## 2017-11-23 LAB — CUP PACEART REMOTE DEVICE CHECK
Battery Remaining Longevity: 79 mo
Battery Voltage: 3.01 V
Implantable Lead Implant Date: 20170320
Implantable Pulse Generator Implant Date: 20170320
Lead Channel Pacing Threshold Amplitude: 0.75 V
Lead Channel Pacing Threshold Pulse Width: 0.4 ms
Lead Channel Sensing Intrinsic Amplitude: 15.875 mV
Lead Channel Setting Pacing Pulse Width: 0.4 ms
Lead Channel Setting Sensing Sensitivity: 2 mV
MDC IDC LEAD LOCATION: 753860
MDC IDC MSMT LEADCHNL RV IMPEDANCE VALUE: 494 Ohm
MDC IDC MSMT LEADCHNL RV IMPEDANCE VALUE: 608 Ohm
MDC IDC MSMT LEADCHNL RV SENSING INTR AMPL: 15.875 mV
MDC IDC SESS DTM: 20190417141317
MDC IDC SET LEADCHNL RV PACING AMPLITUDE: 3.5 V
MDC IDC STAT BRADY RV PERCENT PACED: 99.3 %

## 2017-11-23 NOTE — Progress Notes (Signed)
Letter  

## 2017-11-23 NOTE — Progress Notes (Signed)
Remote pacemaker transmission.   

## 2017-12-01 ENCOUNTER — Encounter: Payer: Self-pay | Admitting: Internal Medicine

## 2017-12-01 ENCOUNTER — Ambulatory Visit (INDEPENDENT_AMBULATORY_CARE_PROVIDER_SITE_OTHER): Payer: Medicare Other | Admitting: Internal Medicine

## 2017-12-01 VITALS — BP 148/82 | HR 81 | Ht 65.0 in | Wt 182.0 lb

## 2017-12-01 DIAGNOSIS — Z950 Presence of cardiac pacemaker: Secondary | ICD-10-CM | POA: Diagnosis not present

## 2017-12-01 DIAGNOSIS — I421 Obstructive hypertrophic cardiomyopathy: Secondary | ICD-10-CM

## 2017-12-01 DIAGNOSIS — I482 Chronic atrial fibrillation, unspecified: Secondary | ICD-10-CM

## 2017-12-01 DIAGNOSIS — I441 Atrioventricular block, second degree: Secondary | ICD-10-CM

## 2017-12-01 LAB — CUP PACEART INCLINIC DEVICE CHECK
Battery Voltage: 3.01 V
Implantable Lead Implant Date: 20170320
Implantable Lead Location: 753860
Implantable Pulse Generator Implant Date: 20170320
Lead Channel Pacing Threshold Amplitude: 0.625 V
Lead Channel Pacing Threshold Pulse Width: 0.4 ms
Lead Channel Sensing Intrinsic Amplitude: 13.875 mV
Lead Channel Sensing Intrinsic Amplitude: 15.625 mV
Lead Channel Setting Pacing Pulse Width: 0.4 ms
Lead Channel Setting Sensing Sensitivity: 2 mV
MDC IDC MSMT BATTERY REMAINING LONGEVITY: 78 mo
MDC IDC MSMT LEADCHNL RV IMPEDANCE VALUE: 475 Ohm
MDC IDC MSMT LEADCHNL RV IMPEDANCE VALUE: 589 Ohm
MDC IDC SESS DTM: 20190426123805
MDC IDC SET LEADCHNL RV PACING AMPLITUDE: 3.25 V
MDC IDC STAT BRADY RV PERCENT PACED: 99.35 %

## 2017-12-01 NOTE — Patient Instructions (Addendum)
Medication Instructions:  Your physician recommends that you continue on your current medications as directed. Please refer to the Current Medication list given to you today.  Labwork: None ordered.  Testing/Procedures: None ordered.  Follow-Up: Your physician wants you to follow-up in: 9 months with Dr. Lovena Le.   You will receive a reminder letter in the mail two months in advance. If you don't receive a letter, please call our office to schedule the follow-up appointment.  Remote monitoring is used to monitor your Pacemaker from home. This monitoring reduces the number of office visits required to check your device to one time per year. It allows [place] to keep an eye on the functioning of your device to ensure it is working properly. You are scheduled for a device check from home on 02/21/2018. You may send your transmission at any time that day. If you have a wireless device, the transmission will be sent automatically. After your physician reviews your transmission, you will receive a postcard with your next transmission date.  Any Other Special Instructions Will Be Listed Below (If Applicable).  If you need a refill on your cardiac medications before your next appointment, please call your pharmacy.

## 2017-12-01 NOTE — Progress Notes (Signed)
HPI Tracey Mcclure returns today for ongoing evaluation and management of chronic atrial fib, symptomatic bradycardia, HCM, s/p PPM insertion. She experienced a fracture of her pacing lead back in January and had a new RV lead placed by Dr. Curt Bears. She was initially sore but now feels better. She has some itching around incision. She is frustrated by her knee. She had tried to obtain an MRI but needed the device turned to the MRI mode which could not be accomplished at the MRI facility.  No Known Allergies   Current Outpatient Medications  Medication Sig Dispense Refill  . acetaminophen (TYLENOL) 325 MG tablet Take 650 mg by mouth every 6 (six) hours as needed for mild pain or headache.     [name] Kitchen apixaban (ELIQUIS) 5 MG TABS tablet Take 1 tablet (5 mg total) by mouth 2 (two) times daily. 180 tablet 1  . atorvastatin (LIPITOR) 40 MG tablet Take 40 mg by mouth at bedtime.     . Cholecalciferol (VITAMIN D) 2000 units CAPS Take 2,000 Units by mouth daily.     . DiphenhydrAMINE HCl (BENADRYL PO) Take 25 mg by mouth at bedtime as needed (for sleep).     [name] Kitchen ELIQUIS 5 MG TABS tablet TAKE 1 TABLET BY MOUTH TWICE A DAY 180 tablet 1  . hydrochlorothiazide (HYDRODIURIL) 25 MG tablet Take 0.5 tablets (12.5 mg total) by mouth daily. 90 tablet 2  . hydrocortisone cream 0.5 % Apply 1 application topically daily as needed for itching.    . levothyroxine (SYNTHROID, LEVOTHROID) 125 MCG tablet Take 125 mcg by mouth daily. Every day except Sundays.    . metoprolol succinate (TOPROL XL) 25 MG 24 hr tablet Take 1 tablet (25 mg total) by mouth daily. 90 tablet 3  . Probiotic Product (PROBIOTIC DAILY PO) Take 1 tablet by mouth daily.     . vitamin B-12 (CYANOCOBALAMIN) 1000 MCG tablet Take 1,000 mcg by mouth daily.     No current facility-administered medications for this visit.      Past Medical History:  Diagnosis Date  . Atrial fibrillation ([place]) 12/05/2013  . Colon adenoma   . Gastric mass 10/14/2013  .  GERD (gastroesophageal reflux disease)   . HTN (hypertension)    CONTROLLED  . Hypercholesteremia   . Hypertr obst cardiomyop   . Hypothyroidism   . Iron deficiency anemia, unspecified 10/14/2013  . 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  . Mitral insufficiency   . 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  . Obesity   . Palpitations     ROS:   All systems reviewed and negative except as noted in the HPI.   Past Surgical History:  Procedure Laterality Date  . ABDOMINAL HYSTERECTOMY    . EP IMPLANTABLE DEVICE N/A 10/26/2015   Procedure: Pacemaker Implant;  Surgeon: Evans Lance, MD;  Location: [facility];  Service: Cardiovascular;  Laterality: N/A;  . OVARY SURGERY     BENIGN TUMOR  . PACEMAKER REVISION N/A 09/01/2017   Procedure: PACEMAKER REVISION;  Surgeon: Constance Haw, MD;  Location: [facility];  Service: Cardiovascular;  Laterality: N/A;     Family History  Problem Relation Age of Onset  . Heart attack Brother 88  . Prostate cancer Brother   . Breast cancer Sister 68     Social History   Socioeconomic History  . Marital status: Widowed    Spouse name: Not on file  . Number of children:  1  . Years of education: Not on file  . Highest education level: Not on file  Occupational History  . 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 Financial resource strain: Not on file  . Food insecurity:    Worry: Not on file    Inability: Not on file  . Transportation needs:    Medical: Not on file    Non-medical: Not on file  Tobacco Use  . Smoking status: Never Smoker  . Smokeless tobacco: Never Used  Substance and Sexual Activity  . Alcohol use: No  . Drug use: No  . Sexual activity: Never  Lifestyle  . Physical activity:    Days per week: Not on file    Minutes per session: Not on file  . Stress: Not on file  Relationships  . Social connections:    Talks on phone: Not on  file    Gets together: Not on file    Attends religious service: Not on file    Active member of club or organization: Not on file    Attends meetings of clubs or organizations: Not on file    Relationship status: Not on file  . Intimate partner violence:    Fear of current or ex partner: Not on file    Emotionally abused: Not on file    Physically abused: Not on file    Forced sexual activity: Not on file  Other Topics Concern  . Not on file  Social History Narrative  . Not on file     BP (!) 148/82   Pulse 81   Ht 5\' 5"  (1.651 m)   Wt 182 lb (82.6 kg)   LMP  (LMP Unknown)   SpO2 98%   BMI 30.29 kg/m   Physical Exam:  Well appearing NAD HEENT: Unremarkable Neck:  No JVD, no thyromegally Lymphatics:  No adenopathy Back:  No CVA tenderness Lungs:  Clear HEART:  Regular rate rhythm, no murmurs, no rubs, no clicks Abd:  soft, positive bowel sounds, no organomegally, no rebound, no guarding Ext:  2 plus pulses, no edema, no cyanosis, no clubbing Skin:  No rashes no nodules Neuro:  CN II through XII intact, motor grossly intact  EKG Atrial fib with ventricular pacing  DEVICE  Normal device function.  See PaceArt for details.   Assess/Plan: 1. Atrial fib - her ventricular rate is well controlled. She will continue her Eliquis. 2. CHB - she is asymptoamtic. Her escape is 32/min today. 3. PPM - her medtronic single chamber PPM is working normally. 4. Coags - she is tolerating the eliquis with no bleeding.   Mikle [email or web address].D.

## 2017-12-07 ENCOUNTER — Other Ambulatory Visit: Payer: Self-pay | Admitting: Cardiology

## 2017-12-08 NOTE — Telephone Encounter (Signed)
Rx sent to pharmacy   

## 2017-12-22 DIAGNOSIS — M1712 Unilateral primary osteoarthritis, left knee: Secondary | ICD-10-CM | POA: Diagnosis not present

## 2017-12-22 DIAGNOSIS — M25562 Pain in left knee: Secondary | ICD-10-CM | POA: Diagnosis not present

## 2018-01-29 DIAGNOSIS — E7849 Other hyperlipidemia: Secondary | ICD-10-CM | POA: Diagnosis not present

## 2018-01-29 DIAGNOSIS — R82998 Other abnormal findings in urine: Secondary | ICD-10-CM | POA: Diagnosis not present

## 2018-01-29 DIAGNOSIS — R7301 Impaired fasting glucose: Secondary | ICD-10-CM | POA: Diagnosis not present

## 2018-01-29 DIAGNOSIS — I10 Essential (primary) hypertension: Secondary | ICD-10-CM | POA: Diagnosis not present

## 2018-01-29 DIAGNOSIS — E038 Other specified hypothyroidism: Secondary | ICD-10-CM | POA: Diagnosis not present

## 2018-02-05 DIAGNOSIS — I4891 Unspecified atrial fibrillation: Secondary | ICD-10-CM | POA: Diagnosis not present

## 2018-02-05 DIAGNOSIS — Z0000 Encounter for general adult medical examination without abnormal findings: Secondary | ICD-10-CM | POA: Diagnosis not present

## 2018-02-05 DIAGNOSIS — M25562 Pain in left knee: Secondary | ICD-10-CM | POA: Diagnosis not present

## 2018-02-05 DIAGNOSIS — E038 Other specified hypothyroidism: Secondary | ICD-10-CM | POA: Diagnosis not present

## 2018-02-05 DIAGNOSIS — N183 Chronic kidney disease, stage 3 (moderate): Secondary | ICD-10-CM | POA: Diagnosis not present

## 2018-02-05 DIAGNOSIS — I10 Essential (primary) hypertension: Secondary | ICD-10-CM | POA: Diagnosis not present

## 2018-02-05 DIAGNOSIS — Z950 Presence of cardiac pacemaker: Secondary | ICD-10-CM | POA: Diagnosis not present

## 2018-02-05 DIAGNOSIS — Z6831 Body mass index (BMI) 31.0-31.9, adult: Secondary | ICD-10-CM | POA: Diagnosis not present

## 2018-02-05 DIAGNOSIS — E7849 Other hyperlipidemia: Secondary | ICD-10-CM | POA: Diagnosis not present

## 2018-02-05 DIAGNOSIS — I517 Cardiomegaly: Secondary | ICD-10-CM | POA: Diagnosis not present

## 2018-02-05 DIAGNOSIS — Z1389 Encounter for screening for other disorder: Secondary | ICD-10-CM | POA: Diagnosis not present

## 2018-02-05 DIAGNOSIS — D472 Monoclonal gammopathy: Secondary | ICD-10-CM | POA: Diagnosis not present

## 2018-02-12 DIAGNOSIS — Z1212 Encounter for screening for malignant neoplasm of rectum: Secondary | ICD-10-CM | POA: Diagnosis not present

## 2018-02-16 ENCOUNTER — Inpatient Hospital Stay: Payer: Medicare Other | Attending: Hematology and Oncology

## 2018-02-16 DIAGNOSIS — I421 Obstructive hypertrophic cardiomyopathy: Secondary | ICD-10-CM | POA: Diagnosis not present

## 2018-02-16 DIAGNOSIS — D472 Monoclonal gammopathy: Secondary | ICD-10-CM | POA: Diagnosis not present

## 2018-02-16 LAB — CBC WITH DIFFERENTIAL/PLATELET
BASOS ABS: 0.1 10*3/uL (ref 0.0–0.1)
BASOS PCT: 1 %
EOS PCT: 1 %
Eosinophils Absolute: 0.1 10*3/uL (ref 0.0–0.5)
HCT: 39.1 % (ref 34.8–46.6)
Hemoglobin: 13.1 g/dL (ref 11.6–15.9)
Lymphocytes Relative: 40 %
Lymphs Abs: 2.4 10*3/uL (ref 0.9–3.3)
MCH: 32 pg (ref 25.1–34.0)
MCHC: 33.6 g/dL (ref 31.5–36.0)
MCV: 95.2 fL (ref 79.5–101.0)
MONO ABS: 0.5 10*3/uL (ref 0.1–0.9)
Monocytes Relative: 8 %
Neutro Abs: 3.1 10*3/uL (ref 1.5–6.5)
Neutrophils Relative %: 50 %
PLATELETS: 254 10*3/uL (ref 145–400)
RBC: 4.1 MIL/uL (ref 3.70–5.45)
RDW: 13.3 % (ref 11.2–14.5)
WBC: 6.1 10*3/uL (ref 3.9–10.3)

## 2018-02-16 LAB — COMPREHENSIVE METABOLIC PANEL
ALBUMIN: 3.9 g/dL (ref 3.5–5.0)
ALK PHOS: 105 U/L (ref 38–126)
ALT: 16 U/L (ref 0–44)
AST: 22 U/L (ref 15–41)
Anion gap: 7 (ref 5–15)
BILIRUBIN TOTAL: 0.8 mg/dL (ref 0.3–1.2)
BUN: 11 mg/dL (ref 8–23)
CALCIUM: 9.6 mg/dL (ref 8.9–10.3)
CO2: 25 mmol/L (ref 22–32)
CREATININE: 0.92 mg/dL (ref 0.44–1.00)
Chloride: 105 mmol/L (ref 98–111)
GFR calc Af Amer: >60 mL/min (ref >60)
GFR calc non Af Amer: 59 mL/min — ABNORMAL LOW (ref >60)
GLUCOSE: 91 mg/dL (ref 70–99)
Potassium: 3.8 mmol/L (ref 3.5–5.1)
Sodium: 137 mmol/L (ref 135–145)
TOTAL PROTEIN: 6.9 g/dL (ref 6.5–8.1)

## 2018-02-19 LAB — MULTIPLE MYELOMA PANEL, SERUM
ALBUMIN/GLOB SERPL: 1.2 (ref 0.7–1.7)
ALPHA2 GLOB SERPL ELPH-MCNC: 0.8 g/dL (ref 0.4–1.0)
Albumin SerPl Elph-Mcnc: 3.4 g/dL (ref 2.9–4.4)
Alpha 1: 0.2 g/dL (ref 0.0–0.4)
B-GLOBULIN SERPL ELPH-MCNC: 0.9 g/dL (ref 0.7–1.3)
GAMMA GLOB SERPL ELPH-MCNC: 1.1 g/dL (ref 0.4–1.8)
Globulin, Total: 3 g/dL (ref 2.2–3.9)
IGG (IMMUNOGLOBIN G), SERUM: 895 mg/dL (ref 700–1600)
IGM (IMMUNOGLOBULIN M), SRM: 129 mg/dL (ref 26–217)
IgA: 277 mg/dL (ref 64–422)
M PROTEIN SERPL ELPH-MCNC: 0.3 g/dL — AB
TOTAL PROTEIN ELP: 6.4 g/dL (ref 6.0–8.5)

## 2018-02-19 LAB — KAPPA/LAMBDA LIGHT CHAINS
KAPPA FREE LGHT CHN: 24 mg/L — AB (ref 3.3–19.4)
Kappa, lambda light chain ratio: 1.3 (ref 0.26–1.65)
Lambda free light chains: 18.4 mg/L (ref 5.7–26.3)

## 2018-02-21 ENCOUNTER — Ambulatory Visit (INDEPENDENT_AMBULATORY_CARE_PROVIDER_SITE_OTHER): Payer: Medicare Other | Admitting: *Deleted

## 2018-02-21 DIAGNOSIS — I441 Atrioventricular block, second degree: Secondary | ICD-10-CM | POA: Diagnosis not present

## 2018-02-21 NOTE — Progress Notes (Signed)
Remote pacemaker transmission.   

## 2018-02-22 ENCOUNTER — Encounter: Payer: Self-pay | Admitting: Cardiology

## 2018-02-23 ENCOUNTER — Telehealth: Payer: Self-pay | Admitting: Hematology and Oncology

## 2018-02-23 ENCOUNTER — Encounter: Payer: Self-pay | Admitting: Hematology and Oncology

## 2018-02-23 ENCOUNTER — Inpatient Hospital Stay (HOSPITAL_BASED_OUTPATIENT_CLINIC_OR_DEPARTMENT_OTHER): Payer: Medicare Other | Admitting: Hematology and Oncology

## 2018-02-23 DIAGNOSIS — D472 Monoclonal gammopathy: Secondary | ICD-10-CM | POA: Diagnosis not present

## 2018-02-23 DIAGNOSIS — Z1231 Encounter for screening mammogram for malignant neoplasm of breast: Secondary | ICD-10-CM | POA: Diagnosis not present

## 2018-02-23 DIAGNOSIS — I421 Obstructive hypertrophic cardiomyopathy: Secondary | ICD-10-CM | POA: Diagnosis not present

## 2018-02-23 DIAGNOSIS — Z803 Family history of malignant neoplasm of breast: Secondary | ICD-10-CM | POA: Diagnosis not present

## 2018-02-23 NOTE — Assessment & Plan Note (Signed)
she will continue current medical management per cardiologist I recommend close follow-up with cardiologist for medication adjustment. She has no signs or symptoms of congestive heart failure

## 2018-02-23 NOTE — Assessment & Plan Note (Signed)
She has no signs of disease progression I recommend yearly visit with history, physical examination and blood work. I discussed with the natural history of multiple myeloma. I educated the patient signs and symptoms to watch out for cancer recurrence I reinforced importance of annual influenza vaccination 

## 2018-02-23 NOTE — Progress Notes (Signed)
[place] OFFICE PROGRESS NOTE  Patient Care Team: Crist Infante, MD as PCP - General (Internal Medicine) [place], Peter M, MD as PCP - Cardiology (Cardiology) Heath Lark, MD as Consulting Physician (Hematology and Oncology)  ASSESSMENT & PLAN:  MGUS (monoclonal gammopathy of unknown significance) She has no signs of disease progression I recommend yearly visit with history, physical examination and blood work. I discussed with the natural history of multiple myeloma. I educated the patient signs and symptoms to watch out for cancer recurrence I reinforced importance of annual influenza vaccination  Hypertrophic obstructive cardiomyopathy ([place]) she will continue current medical management per cardiologist I recommend close follow-up with cardiologist for medication adjustment. She has no signs or symptoms of congestive heart failure   Orders Placed This Encounter  Procedures  . Comprehensive metabolic panel    Standing Status:   Future    Standing Expiration Date:   03/30/2019  . CBC with Differential/Platelet    Standing Status:   Future    Standing Expiration Date:   03/30/2019  . Kappa/lambda light chains    Standing Status:   Future    Standing Expiration Date:   03/30/2019  . Multiple Myeloma Panel (SPEP&IFE w/QIG)    Standing Status:   Future    Standing Expiration Date:   03/30/2019    INTERVAL HISTORY: Please see below for problem oriented charting. She returns for further follow-up She denies recent infection, fever or chills No new bone pain She complained of mild musculoskeletal pain which is related to chronic arthritis She is saddened by the loss of her son to colon cancer recently The patient denies any recent signs or symptoms of bleeding such as spontaneous epistaxis, hematuria or hematochezia.  SUMMARY OF ONCOLOGIC HISTORY:  She was found to have abnormal CBC from routine blood tests. On 10/07/2013, CBC showed hemoglobin of 8.8 with  evidence of iron deficiency. Ferritin level was 7. And review her blood work from 2012, the patient is not anemic. 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 Her last colonoscopy was in 2014 which showed no evidence of abnormalities apart from some benign colon polyps. CT scan of the abdomen show abnormal mass in the stomach. EGD did not reveal any abnormalities. Anemia resolved with iron supplements. In June 2015, further workup showed no progression of MGUS and she is being observed.  REVIEW OF SYSTEMS:   Constitutional: Denies fevers, chills or abnormal weight loss Eyes: Denies blurriness of vision Ears, nose, mouth, throat, and face: Denies mucositis or sore throat Respiratory: Denies cough, dyspnea or wheezes Cardiovascular: Denies palpitation, chest discomfort or lower extremity swelling Gastrointestinal:  Denies nausea, heartburn or change in bowel habits Skin: Denies abnormal skin rashes Lymphatics: Denies new lymphadenopathy or easy bruising Neurological:Denies numbness, tingling or new weaknesses Behavioral/Psych: Mood is stable, no new changes  All other systems were reviewed with the patient and are negative.  I have reviewed the past medical history, past surgical history, social history and family history with the patient and they are unchanged from previous note.  ALLERGIES:  has No Known Allergies.  MEDICATIONS:  Current Outpatient Medications  Medication Sig Dispense Refill  . acetaminophen (TYLENOL) 325 MG tablet Take 650 mg by mouth every 6 (six) hours as needed for mild pain or headache.     [name] Kitchen apixaban (ELIQUIS) 5 MG TABS tablet Take 1 tablet (5 mg total) by mouth 2 (two) times daily. 180 tablet  1  . atorvastatin (LIPITOR) 40 MG tablet Take 40 mg by mouth at bedtime.     . Cholecalciferol (VITAMIN D) 2000 units CAPS Take 2,000 Units by mouth daily.     . DiphenhydrAMINE HCl  (BENADRYL PO) Take 25 mg by mouth at bedtime as needed (for sleep).     [name] Kitchen ELIQUIS 5 MG TABS tablet TAKE 1 TABLET BY MOUTH TWICE A DAY 180 tablet 1  . hydrochlorothiazide (HYDRODIURIL) 25 MG tablet Take 0.5 tablets (12.5 mg total) by mouth daily. 90 tablet 2  . hydrocortisone cream 0.5 % Apply 1 application topically daily as needed for itching.    . levothyroxine (SYNTHROID, LEVOTHROID) 125 MCG tablet Take 125 mcg by mouth daily. Every day except Sundays.    . metoprolol succinate (TOPROL-XL) 25 MG 24 hr tablet TAKE 1 TABLET BY MOUTH EVERY DAY 90 tablet 1  . Probiotic Product (PROBIOTIC DAILY PO) Take 1 tablet by mouth daily.     . vitamin B-12 (CYANOCOBALAMIN) 1000 MCG tablet Take 1,000 mcg by mouth daily.     No current facility-administered medications for this visit.     PHYSICAL EXAMINATION: ECOG PERFORMANCE STATUS: 0 - Asymptomatic GENERAL:alert, no distress and comfortable SKIN: skin color, texture, turgor are normal, no rashes or significant lesions EYES: normal, Conjunctiva are pink and non-injected, sclera clear OROPHARYNX:no exudate, no erythema and lips, buccal mucosa, and tongue normal  NECK: supple, thyroid normal size, non-tender, without nodularity LYMPH:  no palpable lymphadenopathy in the cervical, axillary or inguinal LUNGS: clear to auscultation and percussion with normal breathing effort HEART: regular rate & rhythm and no murmurs and no lower extremity edema ABDOMEN:abdomen soft, non-tender and normal bowel sounds Musculoskeletal:no cyanosis of digits and no clubbing  NEURO: alert & oriented x 3 with fluent speech, no focal motor/sensory deficits  LABORATORY DATA:  I have reviewed the data as listed    Component Value Date/Time   NA 137 02/16/2018 1511   NA 137 02/10/2017 0954   K 3.8 02/16/2018 1511   K 3.9 02/10/2017 0954   CL 105 02/16/2018 1511   CO2 25 02/16/2018 1511   CO2 23 02/10/2017 0954   GLUCOSE 91 02/16/2018 1511   GLUCOSE 87 02/10/2017 0954    BUN 11 02/16/2018 1511   BUN 14.8 02/10/2017 0954   CREATININE 0.92 02/16/2018 1511   CREATININE 1.0 02/10/2017 0954   CALCIUM 9.6 02/16/2018 1511   CALCIUM 10.1 02/10/2017 0954   PROT 6.9 02/16/2018 1511   PROT 7.0 02/10/2017 0954   ALBUMIN 3.9 02/16/2018 1511   ALBUMIN 3.7 02/10/2017 0954   AST 22 02/16/2018 1511   AST 21 02/10/2017 0954   ALT 16 02/16/2018 1511   ALT 14 02/10/2017 0954   ALKPHOS 105 02/16/2018 1511   ALKPHOS 115 02/10/2017 0954   BILITOT 0.8 02/16/2018 1511   BILITOT 0.66 02/10/2017 0954   GFRNONAA 59 (L) 02/16/2018 1511   GFRAA >60 02/16/2018 1511    No results found for: SPEP, UPEP  Lab Results  Component Value Date   WBC 6.1 02/16/2018   NEUTROABS 3.1 02/16/2018   HGB 13.1 02/16/2018   HCT 39.1 02/16/2018   MCV 95.2 02/16/2018   PLT 254 02/16/2018      Chemistry      Component Value Date/Time   NA 137 02/16/2018 1511   NA 137 02/10/2017 0954   K 3.8 02/16/2018 1511   K 3.9 02/10/2017 0954   CL 105 02/16/2018 1511   CO2 25 02/16/2018 1511  CO2 23 02/10/2017 0954   BUN 11 02/16/2018 1511   BUN 14.8 02/10/2017 0954   CREATININE 0.92 02/16/2018 1511   CREATININE 1.0 02/10/2017 0954      Component Value Date/Time   CALCIUM 9.6 02/16/2018 1511   CALCIUM 10.1 02/10/2017 0954   ALKPHOS 105 02/16/2018 1511   ALKPHOS 115 02/10/2017 0954   AST 22 02/16/2018 1511   AST 21 02/10/2017 0954   ALT 16 02/16/2018 1511   ALT 14 02/10/2017 0954   BILITOT 0.8 02/16/2018 1511   BILITOT 0.66 02/10/2017 0954      All questions were answered. The patient knows to call the clinic with any problems, questions or concerns. No barriers to learning was detected.  I spent 10 minutes counseling the patient face to face. The total time spent in the appointment was 15 minutes and more than 50% was on counseling and review of test results  Heath Lark, MD 02/23/2018 5:13 PM

## 2018-02-23 NOTE — Telephone Encounter (Signed)
Gave avs and calendar ° °

## 2018-02-27 DIAGNOSIS — L821 Other seborrheic keratosis: Secondary | ICD-10-CM | POA: Diagnosis not present

## 2018-02-27 DIAGNOSIS — L718 Other rosacea: Secondary | ICD-10-CM | POA: Diagnosis not present

## 2018-02-27 DIAGNOSIS — L720 Epidermal cyst: Secondary | ICD-10-CM | POA: Diagnosis not present

## 2018-02-27 DIAGNOSIS — L814 Other melanin hyperpigmentation: Secondary | ICD-10-CM | POA: Diagnosis not present

## 2018-03-01 LAB — CUP PACEART REMOTE DEVICE CHECK
Battery Remaining Longevity: 86 mo
Battery Voltage: 3.01 V
Brady Statistic RV Percent Paced: 99.82 %
Implantable Lead Location: 753860
Implantable Lead Model: 5076
Lead Channel Impedance Value: 570 Ohm
Lead Channel Sensing Intrinsic Amplitude: 16.875 mV
Lead Channel Setting Pacing Amplitude: 2 V
Lead Channel Setting Sensing Sensitivity: 2 mV
MDC IDC LEAD IMPLANT DT: 20170320
MDC IDC MSMT LEADCHNL RV IMPEDANCE VALUE: 475 Ohm
MDC IDC MSMT LEADCHNL RV PACING THRESHOLD AMPLITUDE: 0.625 V
MDC IDC MSMT LEADCHNL RV PACING THRESHOLD PULSEWIDTH: 0.4 ms
MDC IDC MSMT LEADCHNL RV SENSING INTR AMPL: 16.875 mV
MDC IDC PG IMPLANT DT: 20170320
MDC IDC SESS DTM: 20190717125256
MDC IDC SET LEADCHNL RV PACING PULSEWIDTH: 0.4 ms

## 2018-03-27 ENCOUNTER — Telehealth: Payer: Self-pay

## 2018-03-27 NOTE — Telephone Encounter (Signed)
Primary Cardiologist: [facility] Pre-operative Risk Assessment    Request for surgical clearance:  1. What type of surgery is being performed? Colonoscoy  2. When is this surgery scheduled? 05/23/18   3. What type of clearance is required (medical clearance vs. Pharmacy clearance to hold med vs. Both)? Medication  4. Are there any medications that need to be held prior to surgery and how long? Eliquis   5. Practice name and name of physician performing surgery? [facility], Dr. Watt Climes   6. What is your office phone number [phone]    7.   What is your office fax number [phone]  8.   Anesthesia type (None, local, MAC, general) ? Not listed   Lamar Laundry 03/27/2018, 10:36 AM  _________________________________________________________________   (provider comments below)

## 2018-03-27 NOTE — Telephone Encounter (Signed)
Routed to [place], RN, [place] [address] [place], [place]  [address] [phone]

## 2018-03-27 NOTE — Telephone Encounter (Signed)
Patient with diagnosis of Afib on Eliquis for anticoagulation.    Procedure: colonoscopy Date of procedure: 05/23/18  CHADS2-VASc score of  5 (CHF, HTN, AGE, DM2, stroke/tia x 2, CAD, AGE, female)  CrCl 69ml/min  Per office protocol, patient can hold Eliquis for 24 hours prior to procedure.

## 2018-03-27 NOTE — Telephone Encounter (Signed)
Routing to pharmacy.  Burtis Junes, RN, [place] [address] [place], [place]  [address] [phone].red

## 2018-04-10 DIAGNOSIS — Z6831 Body mass index (BMI) 31.0-31.9, adult: Secondary | ICD-10-CM | POA: Diagnosis not present

## 2018-04-10 DIAGNOSIS — J069 Acute upper respiratory infection, unspecified: Secondary | ICD-10-CM | POA: Diagnosis not present

## 2018-04-10 DIAGNOSIS — R05 Cough: Secondary | ICD-10-CM | POA: Diagnosis not present

## 2018-04-10 DIAGNOSIS — J029 Acute pharyngitis, unspecified: Secondary | ICD-10-CM | POA: Diagnosis not present

## 2018-05-03 DIAGNOSIS — J40 Bronchitis, not specified as acute or chronic: Secondary | ICD-10-CM | POA: Diagnosis not present

## 2018-05-03 DIAGNOSIS — R05 Cough: Secondary | ICD-10-CM | POA: Diagnosis not present

## 2018-05-10 DIAGNOSIS — H43813 Vitreous degeneration, bilateral: Secondary | ICD-10-CM | POA: Diagnosis not present

## 2018-05-10 DIAGNOSIS — H04123 Dry eye syndrome of bilateral lacrimal glands: Secondary | ICD-10-CM | POA: Diagnosis not present

## 2018-05-10 DIAGNOSIS — Z961 Presence of intraocular lens: Secondary | ICD-10-CM | POA: Diagnosis not present

## 2018-05-10 DIAGNOSIS — H52203 Unspecified astigmatism, bilateral: Secondary | ICD-10-CM | POA: Diagnosis not present

## 2018-05-20 ENCOUNTER — Other Ambulatory Visit: Payer: Self-pay | Admitting: Cardiology

## 2018-05-23 ENCOUNTER — Telehealth: Payer: Self-pay | Admitting: Cardiology

## 2018-05-23 ENCOUNTER — Ambulatory Visit (INDEPENDENT_AMBULATORY_CARE_PROVIDER_SITE_OTHER): Payer: Medicare Other | Admitting: *Deleted

## 2018-05-23 DIAGNOSIS — R001 Bradycardia, unspecified: Secondary | ICD-10-CM

## 2018-05-23 DIAGNOSIS — I441 Atrioventricular block, second degree: Secondary | ICD-10-CM | POA: Diagnosis not present

## 2018-05-23 DIAGNOSIS — Z800 Family history of malignant neoplasm of digestive organs: Secondary | ICD-10-CM | POA: Diagnosis not present

## 2018-05-23 DIAGNOSIS — D120 Benign neoplasm of cecum: Secondary | ICD-10-CM | POA: Diagnosis not present

## 2018-05-23 DIAGNOSIS — K640 First degree hemorrhoids: Secondary | ICD-10-CM | POA: Diagnosis not present

## 2018-05-23 DIAGNOSIS — K5730 Diverticulosis of large intestine without perforation or abscess without bleeding: Secondary | ICD-10-CM | POA: Diagnosis not present

## 2018-05-23 DIAGNOSIS — Z86010 Personal history of colonic polyps: Secondary | ICD-10-CM | POA: Diagnosis not present

## 2018-05-23 DIAGNOSIS — D123 Benign neoplasm of transverse colon: Secondary | ICD-10-CM | POA: Diagnosis not present

## 2018-05-23 DIAGNOSIS — D122 Benign neoplasm of ascending colon: Secondary | ICD-10-CM | POA: Diagnosis not present

## 2018-05-23 DIAGNOSIS — K635 Polyp of colon: Secondary | ICD-10-CM | POA: Diagnosis not present

## 2018-05-23 NOTE — Telephone Encounter (Signed)
Spoke with pt and reminded pt of remote transmission that is due today. Pt verbalized understanding.   

## 2018-05-24 NOTE — Progress Notes (Signed)
Remote pacemaker transmission.   

## 2018-05-25 ENCOUNTER — Encounter: Payer: Self-pay | Admitting: Cardiology

## 2018-05-25 DIAGNOSIS — D122 Benign neoplasm of ascending colon: Secondary | ICD-10-CM | POA: Diagnosis not present

## 2018-05-25 DIAGNOSIS — K635 Polyp of colon: Secondary | ICD-10-CM | POA: Diagnosis not present

## 2018-05-25 DIAGNOSIS — D123 Benign neoplasm of transverse colon: Secondary | ICD-10-CM | POA: Diagnosis not present

## 2018-05-25 DIAGNOSIS — D120 Benign neoplasm of cecum: Secondary | ICD-10-CM | POA: Diagnosis not present

## 2018-06-02 ENCOUNTER — Other Ambulatory Visit: Payer: Self-pay | Admitting: Cardiology

## 2018-06-13 NOTE — Progress Notes (Signed)
Tracey Mcclure Date of Birth: [birth year]   History of Present Illness: Tracey Mcclure is seen today for followup of atrial fibrillation and bradycardia. She has a history of hypertrophic obstructive cardiomyopathy, hypertension, and Mobitz type I second-degree AV block. When seen in April 2015 she was noted to be in atrial fibrillation with a slow ventricular response of 53. She had only mild palpitations. Verapamil was discontinued. 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 Prior extensive GI work up for heme positive stool and iron deficiency anemia was unrevealing.   She had a Holter monitor in august 2016 that showed sinus rhythm with PACs and PVCs. Some intermittent second degree AV block. Mean HR 65 with slowest HR 31 and longest pause of 3.1 seconds. She was seen by Rosaria Ferries PA. At that time Ecg showed AFib with rate 43 bpm. She complained of some intermittent edema and fatigue.  She was seen by Dr. Lovena Le and underwent PPM placement in March 2017.  In January she presented with a lead fracture and had lead revision on 09/01/17. Following this there was some swelling at the surgery site and she was placed on antibiotics and Eliquis was held with resolution. On subsequent follow up pacemaker was functioning normally and she is back on Eliquis.   Prior to her lead fracture she tore something in her left knee and was getting physical therapy. She reports this is doing better.   On follow up today she states she is doing OK. She has a number of issues including decreased stamina, memory loss, hot flashes and cold hands and feet. She needs a root canal/extraction. Had colonoscopy 2 weeks ago with 7 polyps. She is trying to stay engaged and has joined a gym. She volunteers. Has thought about going to assisted living but she is not ready for this yet.   Current Outpatient Medications on File Prior to Visit  Medication Sig Dispense Refill  . acetaminophen (TYLENOL) 325 MG tablet Take 650 mg  by mouth every 6 (six) hours as needed for mild pain or headache.     [name] Kitchen apixaban (ELIQUIS) 5 MG TABS tablet Take 1 tablet (5 mg total) by mouth 2 (two) times daily. 180 tablet 1  . atorvastatin (LIPITOR) 40 MG tablet Take 40 mg by mouth at bedtime.     . Cholecalciferol (VITAMIN D) 2000 units CAPS Take 2,000 Units by mouth daily.     . DiphenhydrAMINE HCl (BENADRYL PO) Take 25 mg by mouth at bedtime as needed (for sleep).     . hydrochlorothiazide (HYDRODIURIL) 25 MG tablet Take 0.5 tablets (12.5 mg total) by mouth daily. 90 tablet 2  . hydrocortisone cream 0.5 % Apply 1 application topically daily as needed for itching.    . levothyroxine (SYNTHROID, LEVOTHROID) 125 MCG tablet Take 125 mcg by mouth daily. Every day except Sundays.    . metoprolol succinate (TOPROL-XL) 25 MG 24 hr tablet TAKE 1 TABLET BY MOUTH EVERY DAY 90 tablet 1  . Probiotic Product (PROBIOTIC DAILY PO) Take 1 tablet by mouth daily.     . vitamin B-12 (CYANOCOBALAMIN) 1000 MCG tablet Take 1,000 mcg by mouth daily.     No current facility-administered medications on file prior to visit.     No Known Allergies  Past Medical History:  Diagnosis Date  . Atrial fibrillation ([place]) 12/05/2013  . Colon adenoma   . Gastric mass 10/14/2013  . GERD (gastroesophageal reflux disease)   . HTN (hypertension)    CONTROLLED  .  Hypercholesteremia   . Hypertr obst cardiomyop   . Hypothyroidism   . Iron deficiency anemia, unspecified 10/14/2013  . 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  . Mitral insufficiency   . 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  . Obesity   . Palpitations     Past Surgical History:  Procedure Laterality Date  . ABDOMINAL HYSTERECTOMY    . EP IMPLANTABLE DEVICE N/A 10/26/2015   Procedure: Pacemaker Implant;  Surgeon: Evans Lance, MD;  Location: [facility];  Service: Cardiovascular;  Laterality: N/A;  . OVARY SURGERY     BENIGN TUMOR  . PACEMAKER  REVISION N/A 09/01/2017   Procedure: PACEMAKER REVISION;  Surgeon: Constance Haw, MD;  Location: [facility];  Service: Cardiovascular;  Laterality: N/A;    Social History   Tobacco Use  Smoking Status Never Smoker  Smokeless Tobacco Never Used    Social History   Substance and Sexual Activity  Alcohol Use No    Family History  Problem Relation Age of Onset  . Heart attack Brother 21  . Prostate cancer Brother   . Breast cancer Sister 35    Review of Systems: As noted in history of present illness.All other systems were reviewed and are negative.  Physical Exam: BP 136/78   Pulse 64   Ht 5\' 5"  (1.651 m)   Wt 179 lb 6.4 oz (81.4 kg)   LMP  (LMP Unknown)   BMI 29.85 kg/m  GENERAL:  Well appearing overweight WF in NAD HEENT:  PERRL, EOMI, sclera are clear. Oropharynx is clear. NECK:  No jugular venous distention, carotid upstroke brisk and symmetric, no bruits, no thyromegaly or adenopathy LUNGS:  Clear to auscultation bilaterally CHEST:  Unremarkable. Pacer site looks OK.  HEART:  RRR,  PMI not displaced or sustained,S1 and S2 within normal limits, no S3, no S4: no clicks, no rubs, gr 16 systolic murmur across the precordium.  ABD:  Soft, nontender. BS +, no masses or bruits. No hepatomegaly, no splenomegaly EXT:  2 + pulses throughout, no edema, no cyanosis no clubbing SKIN:  Warm and dry.  No rashes NEURO:  Alert and oriented x 3. Cranial nerves II through XII intact. PSYCH:  Cognitively intact    LABORATORY DATA: Lab Results  Component Value Date   WBC 6.1 02/16/2018   HGB 13.1 02/16/2018   HCT 39.1 02/16/2018   PLT 254 02/16/2018   GLUCOSE 91 02/16/2018   ALT 16 02/16/2018   AST 22 02/16/2018   NA 137 02/16/2018   K 3.8 02/16/2018   CL 105 02/16/2018   CREATININE 0.92 02/16/2018   BUN 11 02/16/2018   CO2 25 02/16/2018   Labs dated 11/30/15: cholesterol 165, triglycerides 97, LDL 76, HDL 70. BMET and TSH normal.  Dated 12/19/16:  cholesterol 148, triglycerides 90, HDL 65, LDL 65. BMET and TSH normal. A1c 5.5%.  Dated 01/29/18: cholesterol 148, triglycerides 99, HDL 65, LDL 63.  Assessment / Plan: 1. Atrial fibrillation- paroxysmal- with slow ventricular response.  History of sinus bradycardia with second-degree Mobitz type I AV block.  CHAD-Vasc score of 3-4. On Eliquis for anticoagulation. s/p PPM- revised in January 2019 for lead fracture. Functioning well now.   2. Hypertrophic cardiomyopathy. Echocardiogram in January 2016 is unchanged from 2013 and 2015.  She is asymptomatic.  Will continue HCTZ. Minimal murmur on exam. Edema well controlled with support hose. Instructed on sodium restricted diet. She was concerned she might need an invasive  procedure for her cardiomyopathy but I told her this was unlikely.   3. 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 Asymptomatic.  4. Hypertension, continue current Rx.   5. Iron deficiency anemia with heme positive stool. Negative GI work up in past. Last Hgb 13.9.   6. MGUS- followed yearly by hematology. No change.

## 2018-06-14 ENCOUNTER — Encounter: Payer: Self-pay | Admitting: Cardiology

## 2018-06-14 ENCOUNTER — Ambulatory Visit (INDEPENDENT_AMBULATORY_CARE_PROVIDER_SITE_OTHER): Payer: Medicare Other | Admitting: Cardiology

## 2018-06-14 VITALS — BP 136/78 | HR 64 | Ht 65.0 in | Wt 179.4 lb

## 2018-06-14 DIAGNOSIS — I482 Chronic atrial fibrillation, unspecified: Secondary | ICD-10-CM

## 2018-06-14 DIAGNOSIS — Z950 Presence of cardiac pacemaker: Secondary | ICD-10-CM

## 2018-06-14 DIAGNOSIS — I10 Essential (primary) hypertension: Secondary | ICD-10-CM | POA: Diagnosis not present

## 2018-06-14 DIAGNOSIS — I421 Obstructive hypertrophic cardiomyopathy: Secondary | ICD-10-CM

## 2018-06-14 NOTE — Patient Instructions (Addendum)
Continue your current therapy  I will see you in 6 months.   

## 2018-06-15 ENCOUNTER — Ambulatory Visit: Payer: Medicare Other | Admitting: Physician Assistant

## 2018-06-16 LAB — CUP PACEART REMOTE DEVICE CHECK
Battery Remaining Longevity: 84 mo
Battery Voltage: 3.01 V
Brady Statistic RV Percent Paced: 99.85 %
Date Time Interrogation Session: 20191016145933
Implantable Lead Location: 753860
Implantable Lead Model: 5076
Implantable Pulse Generator Implant Date: 20170320
Lead Channel Impedance Value: 532 Ohm
Lead Channel Impedance Value: 608 Ohm
Lead Channel Pacing Threshold Amplitude: 0.625 V
Lead Channel Sensing Intrinsic Amplitude: 15.875 mV
Lead Channel Sensing Intrinsic Amplitude: 15.875 mV
Lead Channel Setting Pacing Amplitude: 2 V
MDC IDC LEAD IMPLANT DT: 20170320
MDC IDC MSMT LEADCHNL RV PACING THRESHOLD PULSEWIDTH: 0.4 ms
MDC IDC SET LEADCHNL RV PACING PULSEWIDTH: 0.4 ms
MDC IDC SET LEADCHNL RV SENSING SENSITIVITY: 2 mV

## 2018-08-03 DIAGNOSIS — M545 Low back pain: Secondary | ICD-10-CM | POA: Diagnosis not present

## 2018-08-03 DIAGNOSIS — Z6831 Body mass index (BMI) 31.0-31.9, adult: Secondary | ICD-10-CM | POA: Diagnosis not present

## 2018-08-21 ENCOUNTER — Telehealth: Payer: Self-pay | Admitting: Cardiology

## 2018-08-21 NOTE — Telephone Encounter (Signed)
° ° °  Patient requesting assistance application from [facility], also request for samples   1.  What medication and dosage are you requesting samples for? apixaban (ELIQUIS) 5 MG TABS tablet  2.  Are you currently out of this medication? yes

## 2018-08-21 NOTE — Telephone Encounter (Signed)
Spoke with pt.  Adv her that unfortunately we do not have any samples of Eliquis. Pt sts that she is not currently out of the medication. When she picked up her last refill she was told that when she would need another the cost would almost $500 since she has not met her deductible for medications. She is scheduled to see Dr.Taylot next week at the [address] office, adv her that she can ask his nurse if there are any Eliquis samples available. She is interested in applying for pt assistance, adv her to rqst the form while there. Adv pt to complete her portion of the form and return it to Dr.Jordan's office. Adv pt that I will fwd an update to Dr.Jordan's nurse Cheryl so that she is aware. Pt voiced appreciation for the call back. 

## 2018-08-22 ENCOUNTER — Ambulatory Visit (INDEPENDENT_AMBULATORY_CARE_PROVIDER_SITE_OTHER): Payer: Medicare Other

## 2018-08-22 DIAGNOSIS — R001 Bradycardia, unspecified: Secondary | ICD-10-CM | POA: Diagnosis not present

## 2018-08-23 NOTE — Telephone Encounter (Signed)
Spoke to patient Eliquis 5 mg samples left at front desk along with a patient assistance application for Eliquis.Advised to complete application and bring back to me and I will have Dr.Jordan sign and fax.

## 2018-08-23 NOTE — Progress Notes (Signed)
Remote pacemaker transmission.   

## 2018-08-24 LAB — CUP PACEART REMOTE DEVICE CHECK
Date Time Interrogation Session: 20200115171141
Implantable Pulse Generator Implant Date: 20170320
Lead Channel Impedance Value: 494 Ohm
Lead Channel Pacing Threshold Amplitude: 0.75 V
Lead Channel Sensing Intrinsic Amplitude: 17.75 mV
Lead Channel Sensing Intrinsic Amplitude: 17.75 mV
MDC IDC LEAD IMPLANT DT: 20170320
MDC IDC LEAD LOCATION: 753860
MDC IDC MSMT BATTERY REMAINING LONGEVITY: 83 mo
MDC IDC MSMT BATTERY VOLTAGE: 3.01 V
MDC IDC MSMT LEADCHNL RV IMPEDANCE VALUE: 551 Ohm
MDC IDC MSMT LEADCHNL RV PACING THRESHOLD PULSEWIDTH: 0.4 ms
MDC IDC SET LEADCHNL RV PACING AMPLITUDE: 2 V
MDC IDC SET LEADCHNL RV PACING PULSEWIDTH: 0.4 ms
MDC IDC SET LEADCHNL RV SENSING SENSITIVITY: 2 mV
MDC IDC STAT BRADY RV PERCENT PACED: 99.84 %

## 2018-08-29 ENCOUNTER — Encounter: Payer: Self-pay | Admitting: Internal Medicine

## 2018-08-29 ENCOUNTER — Ambulatory Visit (INDEPENDENT_AMBULATORY_CARE_PROVIDER_SITE_OTHER): Payer: Medicare Other | Admitting: Internal Medicine

## 2018-08-29 VITALS — BP 124/74 | HR 68 | Ht 65.0 in | Wt 183.6 lb

## 2018-08-29 DIAGNOSIS — Z950 Presence of cardiac pacemaker: Secondary | ICD-10-CM | POA: Diagnosis not present

## 2018-08-29 DIAGNOSIS — I442 Atrioventricular block, complete: Secondary | ICD-10-CM

## 2018-08-29 DIAGNOSIS — I482 Chronic atrial fibrillation, unspecified: Secondary | ICD-10-CM | POA: Diagnosis not present

## 2018-08-29 NOTE — Progress Notes (Signed)
HPI Tracey Mcclure returns today for ongoing evaluation and management of her atrial fib and PPM. She has a h/o symptomatic bradycardia with pauses and underwent PPM insertion and early lead malfunction. She had a new RV lead placed a year ago. She has done well in the interim. She denies chest pain or sob. No syncope. Minimal edema.  No Known Allergies   Current Outpatient Medications  Medication Sig Dispense Refill  . acetaminophen (TYLENOL) 325 MG tablet Take 650 mg by mouth every 6 (six) hours as needed for mild pain or headache.     [name] Kitchen apixaban (ELIQUIS) 5 MG TABS tablet Take 1 tablet (5 mg total) by mouth 2 (two) times daily. 180 tablet 1  . atorvastatin (LIPITOR) 40 MG tablet Take 40 mg by mouth at bedtime.     . Cholecalciferol (VITAMIN D) 2000 units CAPS Take 2,000 Units by mouth daily.     . DiphenhydrAMINE HCl (BENADRYL PO) Take 25 mg by mouth at bedtime as needed (for sleep).     . hydrochlorothiazide (HYDRODIURIL) 25 MG tablet Take 0.5 tablets (12.5 mg total) by mouth daily. 90 tablet 2  . hydrocortisone cream 0.5 % Apply 1 application topically daily as needed for itching.    . levothyroxine (SYNTHROID, LEVOTHROID) 125 MCG tablet Take 125 mcg by mouth daily. Every day except Sundays.    . metoprolol succinate (TOPROL-XL) 25 MG 24 hr tablet TAKE 1 TABLET BY MOUTH EVERY DAY 90 tablet 1  . Probiotic Product (PROBIOTIC DAILY PO) Take 1 tablet by mouth daily.     . vitamin B-12 (CYANOCOBALAMIN) 1000 MCG tablet Take 1,000 mcg by mouth daily.     No current facility-administered medications for this visit.      Past Medical History:  Diagnosis Date  . Atrial fibrillation ([place]) 12/05/2013  . Colon adenoma   . Gastric mass 10/14/2013  . GERD (gastroesophageal reflux disease)   . HTN (hypertension)    CONTROLLED  . Hypercholesteremia   . Hypertr obst cardiomyop   . Hypothyroidism   . Iron deficiency anemia, unspecified 10/14/2013  . MGUS (monoclonal gammopathy of unknown  significance) 10/14/2013  . Mitral insufficiency   . 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  . Obesity   . Palpitations     ROS:   All systems reviewed and negative except as noted in the HPI.   Past Surgical History:  Procedure Laterality Date  . ABDOMINAL HYSTERECTOMY    . EP IMPLANTABLE DEVICE N/A 10/26/2015   Procedure: Pacemaker Implant;  Surgeon: Evans Lance, MD;  Location: [facility];  Service: Cardiovascular;  Laterality: N/A;  . OVARY SURGERY     BENIGN TUMOR  . PACEMAKER REVISION N/A 09/01/2017   Procedure: PACEMAKER REVISION;  Surgeon: Constance Haw, MD;  Location: [facility];  Service: Cardiovascular;  Laterality: N/A;     Family History  Problem Relation Age of Onset  . Heart attack Brother 51  . Prostate cancer Brother   . Breast cancer Sister 27     Social History   Socioeconomic History  . Marital status: Widowed    Spouse name: Not on file  . Number of children: 1  . Years of education: Not on file  . Highest education level: Not on file  Occupational History  . 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 Financial resource strain: Not on file  . Food insecurity:  Worry: Not on file    Inability: Not on file  . Transportation needs:    Medical: Not on file    Non-medical: Not on file  Tobacco Use  . Smoking status: Never Smoker  . Smokeless tobacco: Never Used  Substance and Sexual Activity  . Alcohol use: No  . Drug use: No  . Sexual activity: Never  Lifestyle  . Physical activity:    Days per week: Not on file    Minutes per session: Not on file  . Stress: Not on file  Relationships  . Social connections:    Talks on phone: Not on file    Gets together: Not on file    Attends religious service: Not on file    Active member of club or organization: Not on file    Attends meetings of clubs or organizations: Not on file    Relationship status: Not on file  . Intimate  partner violence:    Fear of current or ex partner: Not on file    Emotionally abused: Not on file    Physically abused: Not on file    Forced sexual activity: Not on file  Other Topics Concern  . Not on file  Social History Narrative  . Not on file     BP 124/74   Pulse 68   Ht 5\' 5"  (1.651 m)   Wt 183 lb 9.6 oz (83.3 kg)   LMP  (LMP Unknown)   SpO2 97%   BMI 30.55 kg/m   Physical Exam:  Well appearing NAD HEENT: Unremarkable Neck:  No JVD, no thyromegally Lymphatics:  No adenopathy Back:  No CVA tenderness Lungs:  Clear with no wheezes HEART:  Regular rate rhythm, no murmurs, no rubs, no clicks Abd:  soft, positive bowel sounds, no organomegally, no rebound, no guarding Ext:  2 plus pulses, no edema, no cyanosis, no clubbing Skin:  No rashes no nodules Neuro:  CN II through XII intact, motor grossly intact  EKG - none  DEVICE  Normal device function.  See PaceArt for details.   Assess/Plan: 1. Chronic atrial fib - her ventricular rate is well controlled. She will continue her current meds. 2. PPM - her medtronic single chamber PPM is working normally. 3. HTN - her blood pressure is well controlled today.   Mikle Bosworth.D.

## 2018-08-29 NOTE — Patient Instructions (Signed)
Medication Instructions:  Your physician recommends that you continue on your current medications as directed. Please refer to the Current Medication list given to you today.  Labwork: None ordered.  Testing/Procedures: None ordered.  Follow-Up: Your physician wants you to follow-up in: one year with Dr. Lovena Le.   You will receive a reminder letter in the mail two months in advance. If you don't receive a letter, please call our office to schedule the follow-up appointment.  Remote monitoring is used to monitor your Pacemaker from home. This monitoring reduces the number of office visits required to check your device to one time per year. It allows [place] to keep an eye on the functioning of your device to ensure it is working properly. You are scheduled for a device check from home on 11/21/2018. You may send your transmission at any time that day. If you have a wireless device, the transmission will be sent automatically. After your physician reviews your transmission, you will receive a postcard with your next transmission date.  Any Other Special Instructions Will Be Listed Below (If Applicable).  If you need a refill on your cardiac medications before your next appointment, please call your pharmacy.

## 2018-08-30 LAB — CUP PACEART INCLINIC DEVICE CHECK
Battery Remaining Longevity: 83 mo
Brady Statistic RV Percent Paced: 99.84 %
Date Time Interrogation Session: 20200122170706
Implantable Pulse Generator Implant Date: 20170320
Lead Channel Impedance Value: 475 Ohm
Lead Channel Impedance Value: 532 Ohm
Lead Channel Sensing Intrinsic Amplitude: 13.5 mV
Lead Channel Setting Pacing Amplitude: 2 V
MDC IDC LEAD IMPLANT DT: 20170320
MDC IDC LEAD LOCATION: 753860
MDC IDC MSMT BATTERY VOLTAGE: 3.01 V
MDC IDC MSMT LEADCHNL RV PACING THRESHOLD AMPLITUDE: 0.75 V
MDC IDC MSMT LEADCHNL RV PACING THRESHOLD PULSEWIDTH: 0.4 ms
MDC IDC MSMT LEADCHNL RV SENSING INTR AMPL: 13.5 mV
MDC IDC SET LEADCHNL RV PACING PULSEWIDTH: 0.4 ms
MDC IDC SET LEADCHNL RV SENSING SENSITIVITY: 2 mV

## 2018-09-17 ENCOUNTER — Telehealth: Payer: Self-pay

## 2018-09-17 NOTE — Telephone Encounter (Signed)
Patient assistance form for Eliquis completed and faxed to fax # [phone].

## 2018-09-19 ENCOUNTER — Other Ambulatory Visit: Payer: Self-pay | Admitting: Cardiology

## 2018-09-19 NOTE — Telephone Encounter (Signed)
Patient calling the office for samples of medication:   1.  What medication and dosage are you requesting samples for? Eliquis  2.  Are you currently out of this medication?  Need enough until she hears form pt assistance for her Eliquis

## 2018-09-19 NOTE — Telephone Encounter (Signed)
Pt advise samples are available for pick up at the front desk.  Name: Tracey Mcclure: 2 boxes Lot #     [id number] Expires: 6/22

## 2018-10-08 ENCOUNTER — Telehealth: Payer: Self-pay | Admitting: Cardiology

## 2018-10-08 MED ORDER — APIXABAN 5 MG PO TABS: 5.0000 mg | ORAL_TABLET | Freq: Two times a day (BID) | ORAL | 0 refills | Status: DC

## 2018-10-08 NOTE — Telephone Encounter (Signed)
New Message      Patient is calling to check status of her approval from "Minda Ditto" she would like samples until the approval comes through. Pls call and advise.   Patient calling the office for samples of medication:   1.  What medication and dosage are you requesting samples for? Eliquis   2.  Are you currently out of this medication? 1 left

## 2018-10-08 NOTE — Telephone Encounter (Signed)
LMTCB; requested samples at front desk. Will route to Dr. Doug Sou nurse Malachy Mood  Medication Samples have been provided to the patient.  Drug name: Eliquis       Strength: 5 mg        Qty: 28 tablets  LOT: [id number]  Exp.Date: 01/2021

## 2018-10-09 ENCOUNTER — Telehealth: Payer: Self-pay | Admitting: Cardiology

## 2018-10-09 ENCOUNTER — Telehealth: Payer: Self-pay

## 2018-10-09 NOTE — Telephone Encounter (Signed)
Returned call to patient no answer.No voice mail. 

## 2018-10-09 NOTE — Telephone Encounter (Signed)
New Message           Patient is calling today to check status of he paper work for her medication, pls call and advise.

## 2018-10-09 NOTE — Telephone Encounter (Signed)
error 

## 2018-10-12 NOTE — Telephone Encounter (Signed)
Spoke to patient 10/11/18 advised I have not heard back from patient assistance for Eliquis.

## 2018-10-17 NOTE — Telephone Encounter (Signed)
Called patient left message on personal voice mail received form back from [facility] requesting where to mail Eliquis to.I checked mail to your home.Just wanted you to be aware.

## 2018-10-19 ENCOUNTER — Telehealth: Payer: Self-pay | Admitting: Cardiology

## 2018-10-19 MED ORDER — APIXABAN 5 MG PO TABS: 5.0000 mg | ORAL_TABLET | Freq: Two times a day (BID) | ORAL | 0 refills | Status: DC

## 2018-10-19 NOTE — Telephone Encounter (Signed)
SAMPLE IS AVAILABLE FOR PICK UP

## 2018-10-19 NOTE — Telephone Encounter (Signed)
   Patient calling the office for samples of medication:   1.  What medication and dosage are you requesting samples for?apixaban (ELIQUIS) 5 MG TABS tablet  2.  Are you currently out of this medication? Has a few left but her prescription has not arrived yet and would like to see if she could get a weeks worth to get her through till her delivery comes in.

## 2018-10-24 NOTE — Telephone Encounter (Signed)
Pt called back this morning because she has not received any medication yet. She just wanted to make sure that everything was going as planned. She will also need some more samples

## 2018-10-24 NOTE — Telephone Encounter (Signed)
Returned call to patient I have not heard from patient assistance for eliquis.Eliquis 5 mg samples left at [employer] office front desk.

## 2018-11-20 ENCOUNTER — Other Ambulatory Visit: Payer: Self-pay

## 2018-11-20 MED ORDER — METOPROLOL SUCCINATE ER 25 MG PO TB24: 25.0000 mg | ORAL_TABLET | Freq: Every day | ORAL | 3 refills | Status: DC

## 2018-11-21 ENCOUNTER — Other Ambulatory Visit: Payer: Self-pay

## 2018-11-21 ENCOUNTER — Ambulatory Visit (INDEPENDENT_AMBULATORY_CARE_PROVIDER_SITE_OTHER): Payer: Medicare Other | Admitting: *Deleted

## 2018-11-21 DIAGNOSIS — R001 Bradycardia, unspecified: Secondary | ICD-10-CM

## 2018-11-21 DIAGNOSIS — I442 Atrioventricular block, complete: Secondary | ICD-10-CM

## 2018-11-21 LAB — CUP PACEART REMOTE DEVICE CHECK
Battery Remaining Longevity: 82 mo
Battery Voltage: 3.01 V
Brady Statistic RV Percent Paced: 99.85 %
Date Time Interrogation Session: 20200415132133
Implantable Lead Implant Date: 20170320
Implantable Lead Location: 753860
Implantable Lead Model: 5076
Implantable Pulse Generator Implant Date: 20170320
Lead Channel Impedance Value: 475 Ohm
Lead Channel Impedance Value: 532 Ohm
Lead Channel Pacing Threshold Amplitude: 0.75 V
Lead Channel Pacing Threshold Pulse Width: 0.4 ms
Lead Channel Sensing Intrinsic Amplitude: 17.625 mV
Lead Channel Sensing Intrinsic Amplitude: 17.625 mV
Lead Channel Setting Pacing Amplitude: 2 V
Lead Channel Setting Pacing Pulse Width: 0.4 ms
Lead Channel Setting Sensing Sensitivity: 2 mV

## 2018-11-27 NOTE — Progress Notes (Signed)
Remote pacemaker transmission.   

## 2018-11-30 DIAGNOSIS — N390 Urinary tract infection, site not specified: Secondary | ICD-10-CM | POA: Diagnosis not present

## 2018-11-30 DIAGNOSIS — R8290 Unspecified abnormal findings in urine: Secondary | ICD-10-CM | POA: Diagnosis not present

## 2018-12-17 ENCOUNTER — Ambulatory Visit: Payer: Medicare Other | Admitting: Cardiology

## 2019-02-06 ENCOUNTER — Other Ambulatory Visit: Payer: Self-pay

## 2019-02-06 MED ORDER — ATORVASTATIN CALCIUM 40 MG PO TABS: 40.0000 mg | ORAL_TABLET | Freq: Every day | ORAL | 1 refills | Status: DC

## 2019-02-06 MED ORDER — APIXABAN 5 MG PO TABS: 5.0000 mg | ORAL_TABLET | Freq: Two times a day (BID) | ORAL | 0 refills | Status: DC

## 2019-02-06 NOTE — Telephone Encounter (Signed)
Pt requesting eliquis 5mg . 77yof, wt 83.3kg, scr 0.92(02/17/19), lovw/taylor 08/29/18.

## 2019-02-07 ENCOUNTER — Other Ambulatory Visit: Payer: Self-pay | Admitting: Cardiology

## 2019-02-07 NOTE — Telephone Encounter (Signed)
Pt last saw Dr Lovena Le 08/29/18, last labs 02/16/18 Creat 0.92, age 78, weight 83.3kg, based on specified criteria pt is on appropriate dosage of Eliquis 5mg  BID.  Will refill rx.

## 2019-02-18 ENCOUNTER — Other Ambulatory Visit: Payer: Medicare Other

## 2019-02-20 ENCOUNTER — Ambulatory Visit (INDEPENDENT_AMBULATORY_CARE_PROVIDER_SITE_OTHER): Payer: Medicare Other | Admitting: *Deleted

## 2019-02-20 DIAGNOSIS — I442 Atrioventricular block, complete: Secondary | ICD-10-CM | POA: Diagnosis not present

## 2019-02-20 LAB — CUP PACEART REMOTE DEVICE CHECK
Battery Remaining Longevity: 81 mo
Battery Voltage: 3.01 V
Brady Statistic RV Percent Paced: 99.92 %
Date Time Interrogation Session: 20200715132249
Implantable Lead Implant Date: 20170320
Implantable Lead Location: 753860
Implantable Lead Model: 5076
Implantable Pulse Generator Implant Date: 20170320
Lead Channel Impedance Value: 475 Ohm
Lead Channel Impedance Value: 513 Ohm
Lead Channel Pacing Threshold Amplitude: 0.625 V
Lead Channel Pacing Threshold Pulse Width: 0.4 ms
Lead Channel Sensing Intrinsic Amplitude: 17.375 mV
Lead Channel Sensing Intrinsic Amplitude: 17.375 mV
Lead Channel Setting Pacing Amplitude: 2 V
Lead Channel Setting Pacing Pulse Width: 0.4 ms
Lead Channel Setting Sensing Sensitivity: 2 mV

## 2019-02-25 ENCOUNTER — Ambulatory Visit: Payer: Medicare Other | Admitting: Hematology and Oncology

## 2019-03-01 ENCOUNTER — Encounter: Payer: Self-pay | Admitting: Cardiology

## 2019-03-01 NOTE — Progress Notes (Signed)
Remote pacemaker transmission.   

## 2019-03-08 DIAGNOSIS — Z803 Family history of malignant neoplasm of breast: Secondary | ICD-10-CM | POA: Diagnosis not present

## 2019-03-08 DIAGNOSIS — Z1231 Encounter for screening mammogram for malignant neoplasm of breast: Secondary | ICD-10-CM | POA: Diagnosis not present

## 2019-03-11 DIAGNOSIS — E7849 Other hyperlipidemia: Secondary | ICD-10-CM | POA: Diagnosis not present

## 2019-03-11 DIAGNOSIS — R7301 Impaired fasting glucose: Secondary | ICD-10-CM | POA: Diagnosis not present

## 2019-03-12 DIAGNOSIS — I10 Essential (primary) hypertension: Secondary | ICD-10-CM | POA: Diagnosis not present

## 2019-03-12 DIAGNOSIS — R82998 Other abnormal findings in urine: Secondary | ICD-10-CM | POA: Diagnosis not present

## 2019-03-18 DIAGNOSIS — Z950 Presence of cardiac pacemaker: Secondary | ICD-10-CM | POA: Diagnosis not present

## 2019-03-18 DIAGNOSIS — D509 Iron deficiency anemia, unspecified: Secondary | ICD-10-CM | POA: Diagnosis not present

## 2019-03-18 DIAGNOSIS — M25562 Pain in left knee: Secondary | ICD-10-CM | POA: Diagnosis not present

## 2019-03-18 DIAGNOSIS — D126 Benign neoplasm of colon, unspecified: Secondary | ICD-10-CM | POA: Diagnosis not present

## 2019-03-18 DIAGNOSIS — I517 Cardiomegaly: Secondary | ICD-10-CM | POA: Diagnosis not present

## 2019-03-18 DIAGNOSIS — D472 Monoclonal gammopathy: Secondary | ICD-10-CM | POA: Diagnosis not present

## 2019-03-18 DIAGNOSIS — R0989 Other specified symptoms and signs involving the circulatory and respiratory systems: Secondary | ICD-10-CM | POA: Diagnosis not present

## 2019-03-18 DIAGNOSIS — R001 Bradycardia, unspecified: Secondary | ICD-10-CM | POA: Diagnosis not present

## 2019-03-18 DIAGNOSIS — M545 Low back pain: Secondary | ICD-10-CM | POA: Diagnosis not present

## 2019-03-18 DIAGNOSIS — N183 Chronic kidney disease, stage 3 (moderate): Secondary | ICD-10-CM | POA: Diagnosis not present

## 2019-03-18 DIAGNOSIS — Z1331 Encounter for screening for depression: Secondary | ICD-10-CM | POA: Diagnosis not present

## 2019-03-18 DIAGNOSIS — I4891 Unspecified atrial fibrillation: Secondary | ICD-10-CM | POA: Diagnosis not present

## 2019-03-18 DIAGNOSIS — Z0000 Encounter for general adult medical examination without abnormal findings: Secondary | ICD-10-CM | POA: Diagnosis not present

## 2019-03-27 DIAGNOSIS — Z780 Asymptomatic menopausal state: Secondary | ICD-10-CM | POA: Diagnosis not present

## 2019-04-12 DIAGNOSIS — Z23 Encounter for immunization: Secondary | ICD-10-CM | POA: Diagnosis not present

## 2019-04-25 NOTE — Progress Notes (Signed)
Tracey Mcclure Date of Birth: [birth year]   History of Present Illness: Tracey Mcclure is seen today for followup of atrial fibrillation and bradycardia. She has a history of hypertrophic obstructive cardiomyopathy, hypertension, and Mobitz type I second-degree AV block. When seen in April 2015 she was noted to be in atrial fibrillation with a slow ventricular response of 53. She had only mild palpitations. Verapamil was discontinued. 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 Prior extensive GI work up for heme positive stool and iron deficiency anemia was unrevealing.   She had a Holter monitor in august 2016 that showed sinus rhythm with PACs and PVCs. Some intermittent second degree AV block. Mean HR 65 with slowest HR 31 and longest pause of 3.1 seconds. She was seen by Rosaria Ferries PA. At that time Ecg showed AFib with rate 43 bpm. She complained of some intermittent edema and fatigue.  She was seen by Dr. Lovena Le and underwent PPM placement in March 2017.  In January she presented with a lead fracture and had lead revision on 09/01/17. Following this there was some swelling at the surgery site and she was placed on antibiotics and Eliquis was held with resolution. On subsequent follow up pacemaker was functioning normally and she is back on Eliquis. Last pacemaker check in July.  On follow up today she states she is doing OK. She is still living in her home. Does 20 minutes of stretching exercises daily. She complains about fatigue. Some BP readings have been low. No dyspnea or chest pain. Has some discomfort in her posterior neck.   Current Outpatient Medications on File Prior to Visit  Medication Sig Dispense Refill  . acetaminophen (TYLENOL) 325 MG tablet Take 650 mg by mouth every 6 (six) hours as needed for mild pain or headache.     [name] Kitchen atorvastatin (LIPITOR) 40 MG tablet Take 1 tablet (40 mg total) by mouth at bedtime. 90 tablet 1  . Cholecalciferol (VITAMIN D) 2000 units CAPS Take  2,000 Units by mouth daily.     . DiphenhydrAMINE HCl (BENADRYL PO) Take 25 mg by mouth at bedtime as needed (for sleep).     [name] Kitchen ELIQUIS 5 MG TABS tablet TAKE 1 TABLET BY MOUTH TWICE A DAY 180 tablet 1  . hydrochlorothiazide (HYDRODIURIL) 25 MG tablet Take 0.5 tablets (12.5 mg total) by mouth daily. 90 tablet 2  . hydrocortisone cream 0.5 % Apply 1 application topically daily as needed for itching.    . levothyroxine (SYNTHROID, LEVOTHROID) 125 MCG tablet Take 125 mcg by mouth daily. Every day except Sundays.    . Probiotic Product (PROBIOTIC DAILY PO) Take 1 tablet by mouth daily.     . vitamin B-12 (CYANOCOBALAMIN) 1000 MCG tablet Take 1,000 mcg by mouth daily.     No current facility-administered medications on file prior to visit.     No Known Allergies  Past Medical History:  Diagnosis Date  . Atrial fibrillation ([place]) 12/05/2013  . Colon adenoma   . Gastric mass 10/14/2013  . GERD (gastroesophageal reflux disease)   . HTN (hypertension)    CONTROLLED  . Hypercholesteremia   . Hypertr obst cardiomyop   . Hypothyroidism   . Iron deficiency anemia, unspecified 10/14/2013  . 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  . Mitral insufficiency   . 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  . Obesity   . Palpitations     Past Surgical History:  Procedure Laterality Date  . ABDOMINAL HYSTERECTOMY    .  EP IMPLANTABLE DEVICE N/A 10/26/2015   Procedure: Pacemaker Implant;  Surgeon: Evans Lance, MD;  Location: [facility];  Service: Cardiovascular;  Laterality: N/A;  . OVARY SURGERY     BENIGN TUMOR  . PACEMAKER REVISION N/A 09/01/2017   Procedure: PACEMAKER REVISION;  Surgeon: Constance Haw, MD;  Location: [facility];  Service: Cardiovascular;  Laterality: N/A;    Social History   Tobacco Use  Smoking Status Never Smoker  Smokeless Tobacco Never Used    Social History   Substance and Sexual Activity  Alcohol Use No     Family History  Problem Relation Age of Onset  . Heart attack Brother 35  . Prostate cancer Brother   . Breast cancer Sister 76    Review of Systems: As noted in history of present illness.All other systems were reviewed and are negative.  Physical Exam: BP 126/70   Pulse 85   Temp (!) 97.3 F (36.3 C) (Temporal)   Ht 5\' 5"  (1.651 m)   Wt 180 lb 9.6 oz (81.9 kg)   LMP  (LMP Unknown)   SpO2 99%   BMI 30.05 kg/m  GENERAL:  Well appearing overweight WF in NAD HEENT:  PERRL, EOMI, sclera are clear. Oropharynx is clear. NECK:  No jugular venous distention, carotid upstroke brisk and symmetric, no bruits, no thyromegaly or adenopathy LUNGS:  Clear to auscultation bilaterally CHEST:  Unremarkable. Pacer site looks OK.  HEART:  RRR,  PMI not displaced or sustained,S1 and S2 within normal limits, no S3, no S4: no clicks, no rubs, gr [id number] systolic murmur across the precordium.  ABD:  Soft, nontender. BS +, no masses or bruits. No hepatomegaly, no splenomegaly EXT:  2 + pulses throughout, no edema, no cyanosis no clubbing SKIN:  Warm and dry.  No rashes NEURO:  Alert and oriented x 3. Cranial nerves II through XII intact. PSYCH:  Cognitively intact    LABORATORY DATA: Lab Results  Component Value Date   WBC 6.1 02/16/2018   HGB 13.1 02/16/2018   HCT 39.1 02/16/2018   PLT 254 02/16/2018   GLUCOSE 91 02/16/2018   ALT 16 02/16/2018   AST 22 02/16/2018   NA 137 02/16/2018   K 3.8 02/16/2018   CL 105 02/16/2018   CREATININE 0.92 02/16/2018   BUN 11 02/16/2018   CO2 25 02/16/2018   Labs dated 11/30/15: cholesterol 165, triglycerides 97, LDL 76, HDL 70. BMET and TSH normal.  Dated 12/19/16: cholesterol 148, triglycerides 90, HDL 65, LDL 65. BMET and TSH normal. A1c 5.5%.  Dated 01/29/18: cholesterol 148, triglycerides 99, HDL 65, LDL 63. Dated 03/11/19: cholesterol 148, triglycerides 104, HDL 63, LDL 64, A1c 5.3%. CMET, CBC, TSH normal.  Assessment / Plan: 1. Atrial  fibrillation- paroxysmal- with slow ventricular response.  History of sinus bradycardia with second-degree Mobitz type I AV block.  CHAD-Vasc score of 3-4. On Eliquis for anticoagulation. s/p PPM- revised in January 2019 for lead fracture. Functioning well now.   2. Hypertrophic cardiomyopathy. Echocardiogram in January 2017 is unchanged from 2013 and 2015.  She is asymptomatic.  Will continue HCTZ. Mild murmur on exam. Edema well controlled with support hose. Instructed on sodium restricted diet. I have recommended stopping toprol at this time due to low BP and symptoms of fatigue.  3. 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 Asymptomatic.  4. Hypertension, see above  5. MGUS- followed yearly by hematology. No change.

## 2019-04-26 ENCOUNTER — Ambulatory Visit (INDEPENDENT_AMBULATORY_CARE_PROVIDER_SITE_OTHER): Payer: Medicare Other | Admitting: Cardiology

## 2019-04-26 ENCOUNTER — Other Ambulatory Visit: Payer: Self-pay

## 2019-04-26 ENCOUNTER — Encounter: Payer: Self-pay | Admitting: Cardiology

## 2019-04-26 VITALS — BP 126/70 | HR 85 | Temp 97.3°F | Ht 65.0 in | Wt 180.6 lb

## 2019-04-26 DIAGNOSIS — I421 Obstructive hypertrophic cardiomyopathy: Secondary | ICD-10-CM | POA: Diagnosis not present

## 2019-04-26 DIAGNOSIS — I482 Chronic atrial fibrillation, unspecified: Secondary | ICD-10-CM | POA: Diagnosis not present

## 2019-04-26 DIAGNOSIS — Z950 Presence of cardiac pacemaker: Secondary | ICD-10-CM | POA: Diagnosis not present

## 2019-04-26 DIAGNOSIS — I442 Atrioventricular block, complete: Secondary | ICD-10-CM | POA: Diagnosis not present

## 2019-04-26 NOTE — Patient Instructions (Signed)
Stop taking Toprol XL  Continue your other medication.

## 2019-05-16 DIAGNOSIS — H52203 Unspecified astigmatism, bilateral: Secondary | ICD-10-CM | POA: Diagnosis not present

## 2019-05-16 DIAGNOSIS — H04123 Dry eye syndrome of bilateral lacrimal glands: Secondary | ICD-10-CM | POA: Diagnosis not present

## 2019-05-16 DIAGNOSIS — H43813 Vitreous degeneration, bilateral: Secondary | ICD-10-CM | POA: Diagnosis not present

## 2019-05-16 DIAGNOSIS — Z961 Presence of intraocular lens: Secondary | ICD-10-CM | POA: Diagnosis not present

## 2019-05-23 ENCOUNTER — Ambulatory Visit (INDEPENDENT_AMBULATORY_CARE_PROVIDER_SITE_OTHER): Payer: Medicare Other | Admitting: *Deleted

## 2019-05-23 DIAGNOSIS — I441 Atrioventricular block, second degree: Secondary | ICD-10-CM

## 2019-05-23 DIAGNOSIS — R001 Bradycardia, unspecified: Secondary | ICD-10-CM | POA: Diagnosis not present

## 2019-05-23 LAB — CUP PACEART REMOTE DEVICE CHECK
Battery Remaining Longevity: 72 mo
Battery Voltage: 3 V
Brady Statistic RV Percent Paced: 99.75 %
Date Time Interrogation Session: 20201015124313
Implantable Lead Implant Date: 20170320
Implantable Lead Location: 753860
Implantable Lead Model: 5076
Implantable Pulse Generator Implant Date: 20170320
Lead Channel Impedance Value: 437 Ohm
Lead Channel Impedance Value: 494 Ohm
Lead Channel Pacing Threshold Amplitude: 0.75 V
Lead Channel Pacing Threshold Pulse Width: 0.4 ms
Lead Channel Sensing Intrinsic Amplitude: 13.75 mV
Lead Channel Setting Pacing Amplitude: 2 V
Lead Channel Setting Pacing Pulse Width: 0.4 ms
Lead Channel Setting Sensing Sensitivity: 2 mV

## 2019-06-06 NOTE — Progress Notes (Signed)
Remote pacemaker transmission.   

## 2019-07-28 ENCOUNTER — Other Ambulatory Visit: Payer: Self-pay | Admitting: Cardiology

## 2019-07-28 ENCOUNTER — Other Ambulatory Visit: Payer: Self-pay | Admitting: Internal Medicine

## 2019-07-29 NOTE — Telephone Encounter (Signed)
Pt last saw Dr Lovena Le 08/29/18, last labs 03/11/19 Creat 0.8 at [place] per [place], age 78, weight 81.9kg, based on specified criteria pt is on appropriate dosage of Eliquis 5mg  BID.  Will refill rx.

## 2019-08-22 ENCOUNTER — Ambulatory Visit (INDEPENDENT_AMBULATORY_CARE_PROVIDER_SITE_OTHER): Payer: Medicare Other | Admitting: *Deleted

## 2019-08-22 DIAGNOSIS — R001 Bradycardia, unspecified: Secondary | ICD-10-CM | POA: Diagnosis not present

## 2019-08-22 LAB — CUP PACEART REMOTE DEVICE CHECK
Battery Remaining Longevity: 73 mo
Battery Voltage: 3 V
Brady Statistic RV Percent Paced: 99.3 %
Date Time Interrogation Session: 20210114110103
Implantable Lead Implant Date: 20170320
Implantable Lead Location: 753860
Implantable Lead Model: 5076
Implantable Pulse Generator Implant Date: 20170320
Lead Channel Impedance Value: 456 Ohm
Lead Channel Impedance Value: 513 Ohm
Lead Channel Pacing Threshold Amplitude: 0.875 V
Lead Channel Pacing Threshold Pulse Width: 0.4 ms
Lead Channel Sensing Intrinsic Amplitude: 14.375 mV
Lead Channel Sensing Intrinsic Amplitude: 14.375 mV
Lead Channel Setting Pacing Amplitude: 2 V
Lead Channel Setting Pacing Pulse Width: 0.4 ms
Lead Channel Setting Sensing Sensitivity: 2 mV

## 2019-08-23 NOTE — Progress Notes (Signed)
PPM remote 

## 2019-09-02 ENCOUNTER — Ambulatory Visit: Payer: Medicare Other | Attending: Internal Medicine

## 2019-09-02 DIAGNOSIS — Z23 Encounter for immunization: Secondary | ICD-10-CM | POA: Insufficient documentation

## 2019-09-02 NOTE — Progress Notes (Signed)
   Covid-19 Vaccination Clinic  Name:  Tracey Mcclure    MRN: [id number] DOB: [birth year]  09/02/2019  [name] was observed post Covid-19 immunization for 15 minutes without incidence. She was provided with Vaccine Information Sheet and instruction to access the V-Safe system.   [name] was instructed to call 911 with any severe reactions post vaccine: [name] Kitchen Difficulty breathing  . Swelling of your face and throat  . A fast heartbeat  . A bad rash all over your body  . Dizziness and weakness    Immunizations Administered    Name Date Dose VIS Date Route   Pfizer COVID-19 Vaccine 09/02/2019 10:49 AM 0.3 mL 07/19/2019 Intramuscular   Manufacturer: [place]   Lot: [id number]   [place]: [id number]

## 2019-09-23 ENCOUNTER — Ambulatory Visit: Payer: Medicare Other | Attending: Internal Medicine

## 2019-09-23 DIAGNOSIS — Z23 Encounter for immunization: Secondary | ICD-10-CM

## 2019-09-23 NOTE — Progress Notes (Signed)
   Covid-19 Vaccination Clinic  Name:  ARRIKA SEDLER    MRN: [id number] DOB: 29-Oct-1940  09/23/2019  Ms. Vallone was observed post Covid-19 immunization for 15 minutes without incidence. She was provided with Vaccine Information Sheet and instruction to access the V-Safe system.   Ms. Cifarelli was instructed to call 911 with any severe reactions post vaccine: [name] Kitchen Difficulty breathing  . Swelling of your face and throat  . A fast heartbeat  . A bad rash all over your body  . Dizziness and weakness    Immunizations Administered    Name Date Dose VIS Date Route   Pfizer COVID-19 Vaccine 09/23/2019 11:02 AM 0.3 mL 07/19/2019 Intramuscular   Manufacturer: [place]   Lot: [id number]   [place]: [id number]

## 2019-09-30 ENCOUNTER — Encounter: Payer: Medicare Other | Admitting: Internal Medicine

## 2019-10-01 ENCOUNTER — Encounter: Payer: Self-pay | Admitting: Internal Medicine

## 2019-10-01 ENCOUNTER — Ambulatory Visit (INDEPENDENT_AMBULATORY_CARE_PROVIDER_SITE_OTHER): Payer: Medicare Other | Admitting: Internal Medicine

## 2019-10-01 ENCOUNTER — Other Ambulatory Visit: Payer: Self-pay

## 2019-10-01 VITALS — BP 136/84 | HR 86 | Ht 65.0 in | Wt 182.0 lb

## 2019-10-01 DIAGNOSIS — R001 Bradycardia, unspecified: Secondary | ICD-10-CM | POA: Diagnosis not present

## 2019-10-01 DIAGNOSIS — I4819 Other persistent atrial fibrillation: Secondary | ICD-10-CM | POA: Diagnosis not present

## 2019-10-01 DIAGNOSIS — Z950 Presence of cardiac pacemaker: Secondary | ICD-10-CM

## 2019-10-01 NOTE — Patient Instructions (Signed)

## 2019-10-01 NOTE — Progress Notes (Signed)
HPI Mrs. Summons returns today for ongoing evaluation and management of her atrial fib and PPM. She has a h/o symptomatic bradycardia with pauses and underwent PPM insertion and early lead malfunction. She had a new RV lead placed 2 years ago. She has done well in the interim. She denies chest pain or sob. No syncope. Minimal edema.   No Known Allergies   Current Outpatient Medications  Medication Sig Dispense Refill  . acetaminophen (TYLENOL) 325 MG tablet Take 650 mg by mouth every 6 (six) hours as needed for mild pain or headache.     [name] Kitchen atorvastatin (LIPITOR) 40 MG tablet TAKE 1 TABLET BY MOUTH EVERYDAY AT BEDTIME 90 tablet 1  . Cholecalciferol (VITAMIN D) 2000 units CAPS Take 2,000 Units by mouth daily.     . DiphenhydrAMINE HCl (BENADRYL PO) Take 25 mg by mouth at bedtime as needed (for sleep).     [name] Kitchen ELIQUIS 5 MG TABS tablet TAKE 1 TABLET BY MOUTH TWICE A DAY 180 tablet 1  . hydrochlorothiazide (HYDRODIURIL) 25 MG tablet Take 0.5 tablets (12.5 mg total) by mouth daily. 90 tablet 2  . hydrocortisone cream 0.5 % Apply 1 application topically daily as needed for itching.    . levothyroxine (SYNTHROID, LEVOTHROID) 125 MCG tablet Take 125 mcg by mouth daily. Every day except Sundays.    . Probiotic Product (PROBIOTIC DAILY PO) Take 1 tablet by mouth daily.     . vitamin B-12 (CYANOCOBALAMIN) 1000 MCG tablet Take 1,000 mcg by mouth daily.     No current facility-administered medications for this visit.     Past Medical History:  Diagnosis Date  . Atrial fibrillation ([place]) 12/05/2013  . Colon adenoma   . Gastric mass 10/14/2013  . GERD (gastroesophageal reflux disease)   . HTN (hypertension)    CONTROLLED  . Hypercholesteremia   . Hypertr obst cardiomyop   . Hypothyroidism   . Iron deficiency anemia, unspecified 10/14/2013  . 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  . Mitral insufficiency   . 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    . Obesity   . Palpitations     ROS:   All systems reviewed and negative except as noted in the HPI.   Past Surgical History:  Procedure Laterality Date  . ABDOMINAL HYSTERECTOMY    . EP IMPLANTABLE DEVICE N/A 10/26/2015   Procedure: Pacemaker Implant;  Surgeon: Evans Lance, MD;  Location: [facility];  Service: Cardiovascular;  Laterality: N/A;  . OVARY SURGERY     BENIGN TUMOR  . PACEMAKER REVISION N/A 09/01/2017   Procedure: PACEMAKER REVISION;  Surgeon: Constance Haw, MD;  Location: [facility];  Service: Cardiovascular;  Laterality: N/A;     Family History  Problem Relation Age of Onset  . Heart attack Brother 43  . Prostate cancer Brother   . Breast cancer Sister 3     Social History   Socioeconomic History  . Marital status: Widowed    Spouse name: Not on file  . Number of children: 1  . Years of education: Not on file  . Highest education level: Not on file  Occupational History  . Occupation: Art gallery manager    Comment: retired  Tobacco Use  . Smoking status: Never Smoker  . Smokeless tobacco: Never Used  Substance and Sexual Activity  . Alcohol use: No  . Drug use: No  . Sexual activity: Never  Other Topics Concern  .  Not on file  Social History Narrative  . Not on file   Social Determinants of Health   Financial Resource Strain:   . Difficulty of Paying Living Expenses: Not on file  Food Insecurity:   . Worried About Charity fundraiser in the Last Year: Not on file  . Ran Out of Food in the Last Year: Not on file  Transportation Needs:   . Lack of Transportation (Medical): Not on file  . Lack of Transportation (Non-Medical): Not on file  Physical Activity:   . Days of Exercise per Week: Not on file  . Minutes of Exercise per Session: Not on file  Stress:   . Feeling of Stress : Not on file  Social Connections:   . Frequency of Communication with Friends and Family: Not on file  . Frequency of Social Gatherings with  Friends and Family: Not on file  . Attends Religious Services: Not on file  . Active Member of Clubs or Organizations: Not on file  . Attends Archivist Meetings: Not on file  . Marital Status: Not on file  Intimate Partner Violence:   . Fear of Current or Ex-Partner: Not on file  . Emotionally Abused: Not on file  . Physically Abused: Not on file  . Sexually Abused: Not on file     BP 136/84   Pulse 86   Ht 5\' 5"  (1.651 m)   Wt 182 lb (82.6 kg)   LMP  (LMP Unknown)   SpO2 99%   BMI 30.29 kg/m   Physical Exam:  Well appearing NAD HEENT: Unremarkable Neck:  No JVD, no thyromegally Lymphatics:  No adenopathy Back:  No CVA tenderness Lungs:  Clear with no wheezes HEART:  Regular rate rhythm, no murmurs, no rubs, no clicks Abd:  soft, positive bowel sounds, no organomegally, no rebound, no guarding Ext:  2 plus pulses, no edema, no cyanosis, no clubbing Skin:  No rashes no nodules Neuro:  CN II through XII intact, motor grossly intact  EKG - atrial fib with ventricular pacing  DEVICE  Normal device function.  See PaceArt for details.   Assess/Plan: 1. CHB - she is asymptomatic, s/p PPM insertion 2. Atrial fib - her rates are well controlled.  3. HTN - her sbp log has been reviewed and she notes that she has mostly well controlled bp. She is encouraged to lose weight. 4. Weakness - shehas improved as she has gone from exercising 5 minutes a day to almost 30. Her core strength has improved. I encouraged her to continue.  Mikle Bosworth.D.

## 2019-10-24 NOTE — Progress Notes (Signed)
Tracey Mcclure Date of Birth: 02-04-1941   History of Present Illness: Tracey Mcclure is seen today for followup of atrial fibrillation and bradycardia. She has a history of hypertrophic obstructive cardiomyopathy, hypertension, and Mobitz type I second-degree AV block. When seen in April 2015 she was noted to be in atrial fibrillation with a slow ventricular response of 53. She had only mild palpitations. Verapamil was discontinued. 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 Prior extensive GI work up for heme positive stool and iron deficiency anemia was unrevealing.   She had a Holter monitor in august 2016 that showed sinus rhythm with PACs and PVCs. Some intermittent second degree AV block. Mean HR 65 with slowest HR 31 and longest pause of 3.1 seconds. She was seen by Rosaria Ferries PA. At that time Ecg showed AFib with rate 43 bpm. She complained of some intermittent edema and fatigue.  She was seen by Dr. Lovena Le and underwent PPM placement in March 2017.  In January she presented with a lead fracture and had lead revision on 09/01/17. Following this there was some swelling at the surgery site and she was placed on antibiotics and Eliquis was held with resolution. On subsequent follow up pacemaker was functioning normally and she is back on Eliquis. Last pacemaker check in January.  On her last visit we stopped her metoprolol because her BP was low and she felt fatigued. She did note a significant improvement in her fatigue. BP readings from home looked very good thru February. She had a coulple of high readings in March. She is doing some stretching exercises and a little yard work but is generally sedentary..    Current Outpatient Medications on File Prior to Visit  Medication Sig Dispense Refill  . acetaminophen (TYLENOL) 325 MG tablet Take 650 mg by mouth every 6 (six) hours as needed for mild pain or headache.     [name] Kitchen atorvastatin (LIPITOR) 40 MG tablet TAKE 1 TABLET BY MOUTH EVERYDAY AT  BEDTIME 90 tablet 1  . Cholecalciferol (VITAMIN D) 2000 units CAPS Take 2,000 Units by mouth daily.     . DiphenhydrAMINE HCl (BENADRYL PO) Take 25 mg by mouth at bedtime as needed (for sleep).     [name] Kitchen ELIQUIS 5 MG TABS tablet TAKE 1 TABLET BY MOUTH TWICE A DAY 180 tablet 1  . hydrochlorothiazide (HYDRODIURIL) 25 MG tablet Take 0.5 tablets (12.5 mg total) by mouth daily. 90 tablet 2  . hydrocortisone cream 0.5 % Apply 1 application topically daily as needed for itching.    . levothyroxine (SYNTHROID, LEVOTHROID) 125 MCG tablet Take 125 mcg by mouth daily. Every day except Sundays.    . Probiotic Product (PROBIOTIC DAILY PO) Take 1 tablet by mouth daily.     . vitamin B-12 (CYANOCOBALAMIN) 1000 MCG tablet Take 1,000 mcg by mouth daily.     No current facility-administered medications on file prior to visit.    No Known Allergies  Past Medical History:  Diagnosis Date  . Atrial fibrillation ([place]) 12/05/2013  . Colon adenoma   . Gastric mass 10/14/2013  . GERD (gastroesophageal reflux disease)   . HTN (hypertension)    CONTROLLED  . Hypercholesteremia   . Hypertr obst cardiomyop   . Hypothyroidism   . Iron deficiency anemia, unspecified 10/14/2013  . 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  . Mitral insufficiency   . 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  . Obesity   . Palpitations  Past Surgical History:  Procedure Laterality Date  . ABDOMINAL HYSTERECTOMY    . EP IMPLANTABLE DEVICE N/A 10/26/2015   Procedure: Pacemaker Implant;  Surgeon: Evans Lance, MD;  Location: [facility];  Service: Cardiovascular;  Laterality: N/A;  . OVARY SURGERY     BENIGN TUMOR  . PACEMAKER REVISION N/A 09/01/2017   Procedure: PACEMAKER REVISION;  Surgeon: Constance Haw, MD;  Location: [facility];  Service: Cardiovascular;  Laterality: N/A;    Social History   Tobacco Use  Smoking Status Never Smoker  Smokeless Tobacco Never Used     Social History   Substance and Sexual Activity  Alcohol Use No    Family History  Problem Relation Age of Onset  . Heart attack Brother 55  . Prostate cancer Brother   . Breast cancer Sister 29    Review of Systems: As noted in history of present illness.All other systems were reviewed and are negative.  Physical Exam: BP (!) 176/86   Pulse 72   Ht 5' 4.5" (1.638 m)   Wt 181 lb 12.8 oz (82.5 kg)   LMP  (LMP Unknown)   SpO2 95%   BMI 30.72 kg/m  GENERAL:  Well appearing overweight WF in NAD HEENT:  PERRL, EOMI, sclera are clear. Oropharynx is clear. NECK:  No jugular venous distention, carotid upstroke brisk and symmetric, no bruits, no thyromegaly or adenopathy LUNGS:  Clear to auscultation bilaterally CHEST:  Unremarkable. Pacer site looks OK.  HEART:  RRR,  PMI not displaced or sustained,S1 and S2 within normal limits, no S3, no S4: no clicks, no rubs, gr [id number] systolic murmur across the precordium.  ABD:  Soft, nontender. BS +, no masses or bruits. No hepatomegaly, no splenomegaly EXT:  2 + pulses throughout, no edema, no cyanosis no clubbing SKIN:  Warm and dry.  No rashes NEURO:  Alert and oriented x 3. Cranial nerves II through XII intact. PSYCH:  Cognitively intact    LABORATORY DATA: Lab Results  Component Value Date   WBC 6.1 02/16/2018   HGB 13.1 02/16/2018   HCT 39.1 02/16/2018   PLT 254 02/16/2018   GLUCOSE 91 02/16/2018   ALT 16 02/16/2018   AST 22 02/16/2018   NA 137 02/16/2018   K 3.8 02/16/2018   CL 105 02/16/2018   CREATININE 0.92 02/16/2018   BUN 11 02/16/2018   CO2 25 02/16/2018   Labs dated 11/30/15: cholesterol 165, triglycerides 97, LDL 76, HDL 70. BMET and TSH normal.  Dated 12/19/16: cholesterol 148, triglycerides 90, HDL 65, LDL 65. BMET and TSH normal. A1c 5.5%.  Dated 01/29/18: cholesterol 148, triglycerides 99, HDL 65, LDL 63. Dated 03/11/19: cholesterol 148, triglycerides 104, HDL 63, LDL 64, A1c 5.3%. CMET, CBC, TSH  normal.  Assessment / Plan: 1. Atrial fibrillation- paroxysmal- with slow ventricular response.  History of sinus bradycardia with second-degree Mobitz type I AV block.  CHAD-Vasc score of 3-4. On Eliquis for anticoagulation. s/p PPM- revised in January 2019 for lead fracture. Functioning well now. HR is controlled on no AV nodal blocking agents.   2. Hypertrophic cardiomyopathy. Echocardiogram in January 2017 is unchanged from 2013 and 2015.  She is asymptomatic.  Will continue HCTZ. Mild murmur on exam. Edema well controlled with support hose. Instructed on sodium restricted diet. Off Toprol due to fatigue and low BP.   3. 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 Asymptomatic.  4. Hypertension- BP is elevated today but generally doing very well  at home. Will monitor for now. If needed would increase HCT to 25 mg daily.  5. MGUS- followed yearly by hematology. No change.

## 2019-10-29 ENCOUNTER — Encounter: Payer: Self-pay | Admitting: Cardiology

## 2019-10-29 ENCOUNTER — Ambulatory Visit (INDEPENDENT_AMBULATORY_CARE_PROVIDER_SITE_OTHER): Payer: Medicare Other | Admitting: Cardiology

## 2019-10-29 ENCOUNTER — Other Ambulatory Visit: Payer: Self-pay

## 2019-10-29 VITALS — BP 176/86 | HR 72 | Ht 64.5 in | Wt 181.8 lb

## 2019-10-29 DIAGNOSIS — I442 Atrioventricular block, complete: Secondary | ICD-10-CM

## 2019-10-29 DIAGNOSIS — Z950 Presence of cardiac pacemaker: Secondary | ICD-10-CM

## 2019-10-29 DIAGNOSIS — I10 Essential (primary) hypertension: Secondary | ICD-10-CM

## 2019-10-29 DIAGNOSIS — I421 Obstructive hypertrophic cardiomyopathy: Secondary | ICD-10-CM

## 2019-10-29 DIAGNOSIS — I482 Chronic atrial fibrillation, unspecified: Secondary | ICD-10-CM | POA: Diagnosis not present

## 2019-11-21 ENCOUNTER — Ambulatory Visit (INDEPENDENT_AMBULATORY_CARE_PROVIDER_SITE_OTHER): Payer: Medicare Other | Admitting: *Deleted

## 2019-11-21 DIAGNOSIS — R001 Bradycardia, unspecified: Secondary | ICD-10-CM | POA: Diagnosis not present

## 2019-11-21 LAB — CUP PACEART REMOTE DEVICE CHECK
Battery Remaining Longevity: 73 mo
Battery Voltage: 3 V
Brady Statistic RV Percent Paced: 99.47 %
Date Time Interrogation Session: 20210415154558
Implantable Lead Implant Date: 20170320
Implantable Lead Location: 753860
Implantable Lead Model: 5076
Implantable Pulse Generator Implant Date: 20170320
Lead Channel Impedance Value: 456 Ohm
Lead Channel Impedance Value: 513 Ohm
Lead Channel Pacing Threshold Amplitude: 0.75 V
Lead Channel Pacing Threshold Pulse Width: 0.4 ms
Lead Channel Sensing Intrinsic Amplitude: 14.25 mV
Lead Channel Sensing Intrinsic Amplitude: 14.25 mV
Lead Channel Setting Pacing Amplitude: 2 V
Lead Channel Setting Pacing Pulse Width: 0.4 ms
Lead Channel Setting Sensing Sensitivity: 2 mV

## 2019-11-22 NOTE — Progress Notes (Signed)
PPM Remote  

## 2020-01-17 DIAGNOSIS — M1712 Unilateral primary osteoarthritis, left knee: Secondary | ICD-10-CM | POA: Diagnosis not present

## 2020-01-24 ENCOUNTER — Other Ambulatory Visit: Payer: Self-pay | Admitting: Internal Medicine

## 2020-01-24 NOTE — Telephone Encounter (Signed)
Prescription refill request for Eliquis received.  Last office visit: [place], 10/29/2019 Scr:  0.8 03/11/2019 Age: 79 y.o. Weight: 82   Prescription refill sent.

## 2020-02-07 ENCOUNTER — Other Ambulatory Visit: Payer: Self-pay | Admitting: Cardiology

## 2020-02-20 ENCOUNTER — Ambulatory Visit (INDEPENDENT_AMBULATORY_CARE_PROVIDER_SITE_OTHER): Payer: Medicare Other | Admitting: *Deleted

## 2020-02-20 DIAGNOSIS — I441 Atrioventricular block, second degree: Secondary | ICD-10-CM | POA: Diagnosis not present

## 2020-02-20 LAB — CUP PACEART REMOTE DEVICE CHECK
Battery Remaining Longevity: 65 mo
Battery Voltage: 3 V
Brady Statistic RV Percent Paced: 99.47 %
Date Time Interrogation Session: 20210715072447
Implantable Lead Implant Date: 20170320
Implantable Lead Location: 753860
Implantable Lead Model: 5076
Implantable Pulse Generator Implant Date: 20170320
Lead Channel Impedance Value: 456 Ohm
Lead Channel Impedance Value: 494 Ohm
Lead Channel Pacing Threshold Amplitude: 0.625 V
Lead Channel Pacing Threshold Pulse Width: 0.4 ms
Lead Channel Sensing Intrinsic Amplitude: 15.375 mV
Lead Channel Sensing Intrinsic Amplitude: 15.375 mV
Lead Channel Setting Pacing Amplitude: 2 V
Lead Channel Setting Pacing Pulse Width: 0.4 ms
Lead Channel Setting Sensing Sensitivity: 2 mV

## 2020-02-21 NOTE — Progress Notes (Signed)
Remote pacemaker transmission.   

## 2020-03-13 DIAGNOSIS — Z1231 Encounter for screening mammogram for malignant neoplasm of breast: Secondary | ICD-10-CM | POA: Diagnosis not present

## 2020-04-07 DIAGNOSIS — I10 Essential (primary) hypertension: Secondary | ICD-10-CM | POA: Diagnosis not present

## 2020-04-07 DIAGNOSIS — R7301 Impaired fasting glucose: Secondary | ICD-10-CM | POA: Diagnosis not present

## 2020-04-07 DIAGNOSIS — M859 Disorder of bone density and structure, unspecified: Secondary | ICD-10-CM | POA: Diagnosis not present

## 2020-04-07 DIAGNOSIS — E785 Hyperlipidemia, unspecified: Secondary | ICD-10-CM | POA: Diagnosis not present

## 2020-04-07 DIAGNOSIS — E039 Hypothyroidism, unspecified: Secondary | ICD-10-CM | POA: Diagnosis not present

## 2020-04-14 DIAGNOSIS — R82998 Other abnormal findings in urine: Secondary | ICD-10-CM | POA: Diagnosis not present

## 2020-04-14 DIAGNOSIS — Z1212 Encounter for screening for malignant neoplasm of rectum: Secondary | ICD-10-CM | POA: Diagnosis not present

## 2020-04-14 DIAGNOSIS — E785 Hyperlipidemia, unspecified: Secondary | ICD-10-CM | POA: Diagnosis not present

## 2020-04-14 DIAGNOSIS — Z0000 Encounter for general adult medical examination without abnormal findings: Secondary | ICD-10-CM | POA: Diagnosis not present

## 2020-04-14 DIAGNOSIS — D6869 Other thrombophilia: Secondary | ICD-10-CM | POA: Diagnosis not present

## 2020-04-14 DIAGNOSIS — Z7901 Long term (current) use of anticoagulants: Secondary | ICD-10-CM | POA: Diagnosis not present

## 2020-04-14 DIAGNOSIS — E669 Obesity, unspecified: Secondary | ICD-10-CM | POA: Diagnosis not present

## 2020-04-14 DIAGNOSIS — Z1331 Encounter for screening for depression: Secondary | ICD-10-CM | POA: Diagnosis not present

## 2020-04-14 DIAGNOSIS — D472 Monoclonal gammopathy: Secondary | ICD-10-CM | POA: Diagnosis not present

## 2020-04-14 DIAGNOSIS — I340 Nonrheumatic mitral (valve) insufficiency: Secondary | ICD-10-CM | POA: Diagnosis not present

## 2020-04-14 DIAGNOSIS — I129 Hypertensive chronic kidney disease with stage 1 through stage 4 chronic kidney disease, or unspecified chronic kidney disease: Secondary | ICD-10-CM | POA: Diagnosis not present

## 2020-04-14 DIAGNOSIS — E039 Hypothyroidism, unspecified: Secondary | ICD-10-CM | POA: Diagnosis not present

## 2020-04-14 DIAGNOSIS — R319 Hematuria, unspecified: Secondary | ICD-10-CM | POA: Diagnosis not present

## 2020-04-14 DIAGNOSIS — N1831 Chronic kidney disease, stage 3a: Secondary | ICD-10-CM | POA: Diagnosis not present

## 2020-04-14 DIAGNOSIS — Z950 Presence of cardiac pacemaker: Secondary | ICD-10-CM | POA: Diagnosis not present

## 2020-04-27 ENCOUNTER — Telehealth: Payer: Self-pay | Admitting: *Deleted

## 2020-04-27 NOTE — Telephone Encounter (Signed)
Patient called [facility] to check to see if she should cancel her appointment for tomorrow for her booster as her friend tested positive for COVID-19 yesterday. Patient last saw her friend on 04/10/2020 and has not been around her since.  So, it has been 18 days since she was with her friend and is not having any COVID symptoms whatsoever.  This RN advised that with no symptoms and no other known exposures, then she is fine to proceed with getting her COVID booster tomorrow.  This RN confirmed her appointment time and location.  Patient had no further questions or concerns.

## 2020-04-28 ENCOUNTER — Ambulatory Visit: Payer: Medicare Other | Attending: Internal Medicine

## 2020-04-28 DIAGNOSIS — Z23 Encounter for immunization: Secondary | ICD-10-CM

## 2020-04-30 NOTE — Progress Notes (Signed)
Tracey Mcclure Date of Birth: [birth year]   History of Present Illness: Tracey Mcclure is seen today for followup of atrial fibrillation and bradycardia. She has a history of hypertrophic obstructive cardiomyopathy, hypertension, and Mobitz type I second-degree AV block. When seen in April 2015 she was noted to be in atrial fibrillation with a slow ventricular response of 53. She had only mild palpitations. Verapamil was discontinued. 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 Prior extensive GI work up for heme positive stool and iron deficiency anemia was unrevealing.   She had a Holter monitor in august 2016 that showed sinus rhythm with PACs and PVCs. Some intermittent second degree AV block. Mean HR 65 with slowest HR 31 and longest pause of 3.1 seconds. She was seen by Rosaria Ferries PA. At that time Ecg showed AFib with rate 43 bpm. She complained of some intermittent edema and fatigue.  She was seen by Dr. Lovena Le and underwent PPM placement in March 2017.  In January she presented with a lead fracture and had lead revision on 09/01/17.  Last pacemaker check in July was good.   On her last visit we stopped her metoprolol because her BP was low and she felt fatigued. She did note a significant improvement in her fatigue. BP readings from home looked very good thru September. . She is doing some stretching exercises but not much aerobically. Notes some swelling in her legs at times- usually in the evenings and more related to sodium load. Some dyspnea at times.   Current Outpatient Medications on File Prior to Visit  Medication Sig Dispense Refill  . acetaminophen (TYLENOL) 325 MG tablet Take 650 mg by mouth every 6 (six) hours as needed for mild pain or headache.     [name] Kitchen atorvastatin (LIPITOR) 40 MG tablet TAKE 1 TABLET BY MOUTH EVERYDAY AT BEDTIME 90 tablet 1  . Cholecalciferol (VITAMIN D) 2000 units CAPS Take 2,000 Units by mouth daily.     . DiphenhydrAMINE HCl (BENADRYL PO) Take 25 mg by  mouth at bedtime as needed (for sleep).     [name] Kitchen ELIQUIS 5 MG TABS tablet TAKE 1 TABLET BY MOUTH TWICE A DAY 180 tablet 1  . hydrochlorothiazide (HYDRODIURIL) 25 MG tablet Take 0.5 tablets (12.5 mg total) by mouth daily. 90 tablet 2  . hydrocortisone cream 0.5 % Apply 1 application topically daily as needed for itching.    . levothyroxine (SYNTHROID, LEVOTHROID) 125 MCG tablet Take 125 mcg by mouth daily. Every day except Sundays.    . Probiotic Product (PROBIOTIC DAILY PO) Take 1 tablet by mouth daily.     . vitamin B-12 (CYANOCOBALAMIN) 1000 MCG tablet Take 1,000 mcg by mouth daily.     No current facility-administered medications on file prior to visit.    Allergies  Allergen Reactions  . Azithromycin Other (See Comments)    Patient states that she had severe stomach cramps for a solid 4 hours.     Past Medical History:  Diagnosis Date  . Atrial fibrillation ([place]) 12/05/2013  . Colon adenoma   . Gastric mass 10/14/2013  . GERD (gastroesophageal reflux disease)   . HTN (hypertension)    CONTROLLED  . Hypercholesteremia   . Hypertr obst cardiomyop   . Hypothyroidism   . Iron deficiency anemia, unspecified 10/14/2013  . 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  . Mitral insufficiency   . 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  . Obesity   . Palpitations  Past Surgical History:  Procedure Laterality Date  . ABDOMINAL HYSTERECTOMY    . EP IMPLANTABLE DEVICE N/A 10/26/2015   Procedure: Pacemaker Implant;  Surgeon: Evans Lance, MD;  Location: [facility];  Service: Cardiovascular;  Laterality: N/A;  . OVARY SURGERY     BENIGN TUMOR  . PACEMAKER REVISION N/A 09/01/2017   Procedure: PACEMAKER REVISION;  Surgeon: Constance Haw, MD;  Location: [facility];  Service: Cardiovascular;  Laterality: N/A;    Social History   Tobacco Use  Smoking Status Never Smoker  Smokeless Tobacco Never Used    Social History    Substance and Sexual Activity  Alcohol Use No    Family History  Problem Relation Age of Onset  . Heart attack Brother 27  . Prostate cancer Brother   . Breast cancer Sister 5    Review of Systems: As noted in history of present illness.All other systems were reviewed and are negative.  Physical Exam: BP (!) 160/90   Pulse 68   Ht 5' 4.5" (1.638 m)   Wt 178 lb 3.2 oz (80.8 kg)   LMP  (LMP Unknown)   SpO2 97%   BMI 30.12 kg/m  GENERAL:  Well appearing overweight WF in NAD HEENT:  PERRL, EOMI, sclera are clear. Oropharynx is clear. NECK:  No jugular venous distention, carotid upstroke brisk and symmetric, no bruits, no thyromegaly or adenopathy LUNGS:  Clear to auscultation bilaterally CHEST:  Unremarkable. Pacer site looks OK.  HEART:  RRR,  PMI not displaced or sustained,S1 and S2 within normal limits, no S3, no S4: no clicks, no rubs, gr 2/6 systolic murmur across the precordium.  ABD:  Soft, nontender. BS +, no masses or bruits. No hepatomegaly, no splenomegaly EXT:  2 + pulses throughout, no edema, compression hose on. no cyanosis no clubbing SKIN:  Warm and dry.  No rashes NEURO:  Alert and oriented x 3. Cranial nerves II through XII intact. PSYCH:  Cognitively intact    LABORATORY DATA: Lab Results  Component Value Date   WBC 6.1 02/16/2018   HGB 13.1 02/16/2018   HCT 39.1 02/16/2018   PLT 254 02/16/2018   GLUCOSE 91 02/16/2018   ALT 16 02/16/2018   AST 22 02/16/2018   NA 137 02/16/2018   K 3.8 02/16/2018   CL 105 02/16/2018   CREATININE 0.92 02/16/2018   BUN 11 02/16/2018   CO2 25 02/16/2018   Labs dated 11/30/15: cholesterol 165, triglycerides 97, LDL 76, HDL 70. BMET and TSH normal.  Dated 12/19/16: cholesterol 148, triglycerides 90, HDL 65, LDL 65. BMET and TSH normal. A1c 5.5%.  Dated 01/29/18: cholesterol 148, triglycerides 99, HDL 65, LDL 63. Dated 03/11/19: cholesterol 148, triglycerides 104, HDL 63, LDL 64, A1c 5.3%. CMET, CBC, TSH normal. Dated  04/07/20: cholesterol 151,triglycerides 116, HDL 66, LDL 62. A1c 5.2%. CBC, TFTs, CMET normal.  Assessment / Plan: 1. Atrial fibrillation- paroxysmal- with slow ventricular response.  History of sinus bradycardia with second-degree Mobitz type I AV block.  CHAD-Vasc score of 3-4. On Eliquis for anticoagulation. s/p PPM- revised in January 2019 for lead fracture. Functioning well now. HR is controlled on no AV nodal blocking agents.   2. Hypertrophic cardiomyopathy. Echocardiogram done last  in January 2017.  Will continue HCTZ.  Edema well controlled with support hose. Instructed on sodium restricted diet. Off Toprol due to fatigue and low BP. Will update Echo now.   3. 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 Follow  up on Echo.  4. Hypertension- BP is elevated today but  doing very well at home. Will monitor for now.  5. MGUS- followed yearly by hematology. No change.

## 2020-05-06 ENCOUNTER — Ambulatory Visit (INDEPENDENT_AMBULATORY_CARE_PROVIDER_SITE_OTHER): Payer: Medicare Other | Admitting: Cardiology

## 2020-05-06 ENCOUNTER — Encounter: Payer: Self-pay | Admitting: Cardiology

## 2020-05-06 ENCOUNTER — Other Ambulatory Visit: Payer: Self-pay

## 2020-05-06 VITALS — BP 160/90 | HR 68 | Ht 64.5 in | Wt 178.2 lb

## 2020-05-06 DIAGNOSIS — R0600 Dyspnea, unspecified: Secondary | ICD-10-CM | POA: Diagnosis not present

## 2020-05-06 DIAGNOSIS — I340 Nonrheumatic mitral (valve) insufficiency: Secondary | ICD-10-CM | POA: Diagnosis not present

## 2020-05-06 DIAGNOSIS — I421 Obstructive hypertrophic cardiomyopathy: Secondary | ICD-10-CM

## 2020-05-06 DIAGNOSIS — I482 Chronic atrial fibrillation, unspecified: Secondary | ICD-10-CM | POA: Diagnosis not present

## 2020-05-06 DIAGNOSIS — R0609 Other forms of dyspnea: Secondary | ICD-10-CM

## 2020-05-06 DIAGNOSIS — I10 Essential (primary) hypertension: Secondary | ICD-10-CM | POA: Diagnosis not present

## 2020-05-06 NOTE — Patient Instructions (Signed)
Medication Instructions:  Continue same medications *If you need a refill on your cardiac medications before your next appointment, please call your pharmacy*   Lab Work: None ordered   Testing/Procedures: Echo   Follow-Up: At [facility], you and your health needs are our priority.  As part of our continuing mission to provide you with exceptional heart care, we have created designated Provider Care Teams.  These Care Teams include your primary Cardiologist (physician) and Advanced Practice Providers (APPs -  Physician Assistants and Nurse Practitioners) who all work together to provide you with the care you need, when you need it.  We recommend signing up for the patient portal called "MyChart".  Sign up information is provided on this After Visit Summary.  MyChart is used to connect with patients for Virtual Visits (Telemedicine).  Patients are able to view lab/test results, encounter notes, upcoming appointments, etc.  Non-urgent messages can be sent to your provider as well.   To learn more about what you can do with MyChart, go to [email or web address].    Your next appointment: 6 months  10/2020    The format for your next appointment: Office    Provider:  Dr.Jordan

## 2020-05-13 DIAGNOSIS — Z23 Encounter for immunization: Secondary | ICD-10-CM | POA: Diagnosis not present

## 2020-05-18 ENCOUNTER — Other Ambulatory Visit: Payer: Self-pay

## 2020-05-18 ENCOUNTER — Ambulatory Visit (HOSPITAL_COMMUNITY): Payer: Medicare Other | Attending: Cardiology

## 2020-05-18 DIAGNOSIS — H26492 Other secondary cataract, left eye: Secondary | ICD-10-CM | POA: Diagnosis not present

## 2020-05-18 DIAGNOSIS — R0609 Other forms of dyspnea: Secondary | ICD-10-CM

## 2020-05-18 DIAGNOSIS — I482 Chronic atrial fibrillation, unspecified: Secondary | ICD-10-CM | POA: Insufficient documentation

## 2020-05-18 DIAGNOSIS — H43813 Vitreous degeneration, bilateral: Secondary | ICD-10-CM | POA: Diagnosis not present

## 2020-05-18 DIAGNOSIS — I340 Nonrheumatic mitral (valve) insufficiency: Secondary | ICD-10-CM | POA: Insufficient documentation

## 2020-05-18 DIAGNOSIS — H52203 Unspecified astigmatism, bilateral: Secondary | ICD-10-CM | POA: Diagnosis not present

## 2020-05-18 DIAGNOSIS — R0600 Dyspnea, unspecified: Secondary | ICD-10-CM | POA: Diagnosis not present

## 2020-05-18 DIAGNOSIS — I421 Obstructive hypertrophic cardiomyopathy: Secondary | ICD-10-CM | POA: Diagnosis not present

## 2020-05-18 DIAGNOSIS — I10 Essential (primary) hypertension: Secondary | ICD-10-CM | POA: Diagnosis not present

## 2020-05-18 DIAGNOSIS — Z961 Presence of intraocular lens: Secondary | ICD-10-CM | POA: Diagnosis not present

## 2020-05-18 LAB — ECHOCARDIOGRAM COMPLETE
MV M vel: 5.27 m/s
MV Peak grad: 110.9 mmHg
Radius: 0.6 cm
S' Lateral: 2.1 cm

## 2020-05-19 ENCOUNTER — Other Ambulatory Visit: Payer: Self-pay

## 2020-05-19 ENCOUNTER — Telehealth: Payer: Self-pay | Admitting: Cardiology

## 2020-05-19 DIAGNOSIS — Z01812 Encounter for preprocedural laboratory examination: Secondary | ICD-10-CM

## 2020-05-19 DIAGNOSIS — I10 Essential (primary) hypertension: Secondary | ICD-10-CM

## 2020-05-19 DIAGNOSIS — I7789 Other specified disorders of arteries and arterioles: Secondary | ICD-10-CM | POA: Diagnosis not present

## 2020-05-19 NOTE — Telephone Encounter (Signed)
Patient returned call for echo results, call transferred to [facility]

## 2020-05-20 LAB — BASIC METABOLIC PANEL
BUN/Creatinine Ratio: 11 — ABNORMAL LOW (ref 12–28)
BUN: 11 mg/dL (ref 8–27)
CO2: 26 mmol/L (ref 20–29)
Calcium: 9.3 mg/dL (ref 8.7–10.3)
Chloride: 99 mmol/L (ref 96–106)
Creatinine, Ser: 1.02 mg/dL — ABNORMAL HIGH (ref 0.57–1.00)
GFR calc Af Amer: 61 mL/min/{1.73_m2} (ref >59)
GFR calc non Af Amer: 53 mL/min/{1.73_m2} — ABNORMAL LOW (ref >59)
Glucose: 70 mg/dL (ref 65–99)
Potassium: 3.9 mmol/L (ref 3.5–5.2)
Sodium: 137 mmol/L (ref 134–144)

## 2020-05-20 NOTE — Telephone Encounter (Signed)
Spoke with patient regarding appointment for CTA chest/aorta ordered by Dr. Elissa Lovett 06/03/20 at 12:50 pm----arrival time is 12:30 pm at [place] at [facility].  Liquids only 4 hours prior to study----patient voiced her understanding.Tracey Mcclure

## 2020-05-21 ENCOUNTER — Ambulatory Visit (INDEPENDENT_AMBULATORY_CARE_PROVIDER_SITE_OTHER): Payer: Medicare Other

## 2020-05-21 DIAGNOSIS — R001 Bradycardia, unspecified: Secondary | ICD-10-CM | POA: Diagnosis not present

## 2020-05-22 ENCOUNTER — Telehealth: Payer: Self-pay | Admitting: Cardiology

## 2020-05-22 ENCOUNTER — Telehealth: Payer: Self-pay

## 2020-05-22 NOTE — Telephone Encounter (Signed)
Tracey Mcclure is calling requesting to speak with Tracey Mcclure in regards to her pacemaker and the CT that is scheduled. She did not specify any further and states she will be available for a callback until 3:00 PM today. Please advise.

## 2020-05-22 NOTE — Telephone Encounter (Signed)
The pt states her battery was dead for her receiver. I called [employer] to help trouble shoot the monitor. I told her the nurse could let her know if she can have a CT scan but she refused to talk to a device nurse. She asked [employer] instead. She was happy. Transmission received.

## 2020-05-22 NOTE — Telephone Encounter (Signed)
Spoke to patient she stated she is scheduled to have a chest ct on 10/27 at [place].She wanted to know if a [employer] pacer rep needs to be present.Advised a pacer rep does not need to be present for a chest ct.

## 2020-05-23 LAB — CUP PACEART REMOTE DEVICE CHECK
Battery Remaining Longevity: 65 mo
Battery Voltage: 3 V
Brady Statistic RV Percent Paced: 99.46 %
Date Time Interrogation Session: 20211015084038
Implantable Lead Implant Date: 20170320
Implantable Lead Location: 753860
Implantable Lead Model: 5076
Implantable Pulse Generator Implant Date: 20170320
Lead Channel Impedance Value: 437 Ohm
Lead Channel Impedance Value: 456 Ohm
Lead Channel Pacing Threshold Amplitude: 0.75 V
Lead Channel Pacing Threshold Pulse Width: 0.4 ms
Lead Channel Sensing Intrinsic Amplitude: 14 mV
Lead Channel Sensing Intrinsic Amplitude: 14 mV
Lead Channel Setting Pacing Amplitude: 2 V
Lead Channel Setting Pacing Pulse Width: 0.4 ms
Lead Channel Setting Sensing Sensitivity: 2 mV

## 2020-05-26 NOTE — Progress Notes (Signed)
Remote pacemaker transmission.   

## 2020-06-03 ENCOUNTER — Ambulatory Visit
Admission: RE | Admit: 2020-06-03 | Discharge: 2020-06-03 | Disposition: A | Discharge status: Home / Self Care | Payer: Medicare Other | Source: Ambulatory Visit | Attending: Cardiology | Admitting: Cardiology

## 2020-06-03 ENCOUNTER — Telehealth: Payer: Self-pay | Admitting: Cardiology

## 2020-06-03 DIAGNOSIS — K8020 Calculus of gallbladder without cholecystitis without obstruction: Secondary | ICD-10-CM | POA: Diagnosis not present

## 2020-06-03 DIAGNOSIS — I7789 Other specified disorders of arteries and arterioles: Secondary | ICD-10-CM

## 2020-06-03 DIAGNOSIS — I712 Thoracic aortic aneurysm, without rupture: Secondary | ICD-10-CM | POA: Diagnosis not present

## 2020-06-03 DIAGNOSIS — N1330 Unspecified hydronephrosis: Secondary | ICD-10-CM | POA: Diagnosis not present

## 2020-06-03 MED ORDER — IOPAMIDOL (ISOVUE-370) INJECTION 76%
100.0000 mL | Freq: Once | INTRAVENOUS | Status: AC | PRN
  Administered 2020-06-03: 75 mL via INTRAVENOUS

## 2020-06-03 NOTE — Telephone Encounter (Signed)
Chriss Driver, RN  06/03/2020 2:09 PM EDT Back to Top    Attempted to call patient, left message for patient to call back to office.    Peter M Martinique, MD  05/20/2020 8:18 AM EDT     All values are normal or within acceptable limits.  Medication changes / Follow up labs / Other changes or recommendations:  None OK for CT Peter Martinique, MD 05/20/2020 8:18 AM    Patient called w/results She had CT test today

## 2020-06-03 NOTE — Telephone Encounter (Signed)
Patient is returning call to discuss BMET results. 

## 2020-06-15 DIAGNOSIS — C44311 Basal cell carcinoma of skin of nose: Secondary | ICD-10-CM | POA: Diagnosis not present

## 2020-06-25 DIAGNOSIS — K219 Gastro-esophageal reflux disease without esophagitis: Secondary | ICD-10-CM | POA: Diagnosis not present

## 2020-06-25 DIAGNOSIS — Z6379 Other stressful life events affecting family and household: Secondary | ICD-10-CM | POA: Diagnosis not present

## 2020-06-25 DIAGNOSIS — K529 Noninfective gastroenteritis and colitis, unspecified: Secondary | ICD-10-CM | POA: Diagnosis not present

## 2020-06-25 DIAGNOSIS — R197 Diarrhea, unspecified: Secondary | ICD-10-CM | POA: Diagnosis not present

## 2020-07-22 DIAGNOSIS — Z85828 Personal history of other malignant neoplasm of skin: Secondary | ICD-10-CM | POA: Diagnosis not present

## 2020-07-22 DIAGNOSIS — C44311 Basal cell carcinoma of skin of nose: Secondary | ICD-10-CM | POA: Diagnosis not present

## 2020-07-23 ENCOUNTER — Other Ambulatory Visit: Payer: Self-pay | Admitting: Internal Medicine

## 2020-07-24 NOTE — Telephone Encounter (Signed)
Pt last saw Dr Martinique 05/06/20, last labs 05/19/20 Creat 1.02, age 79, weight 80.8kg, based on specified criteria pt is on appropriate dosage of Eliquis 5mg  BID.  Will refill rx.

## 2020-08-12 ENCOUNTER — Other Ambulatory Visit: Payer: Self-pay | Admitting: Cardiology

## 2020-08-31 ENCOUNTER — Ambulatory Visit (INDEPENDENT_AMBULATORY_CARE_PROVIDER_SITE_OTHER): Payer: Medicare Other

## 2020-08-31 DIAGNOSIS — R001 Bradycardia, unspecified: Secondary | ICD-10-CM

## 2020-09-02 LAB — CUP PACEART REMOTE DEVICE CHECK
Battery Remaining Longevity: 56 mo
Battery Voltage: 2.99 V
Brady Statistic RV Percent Paced: 97.37 %
Date Time Interrogation Session: 20220124122124
Implantable Lead Implant Date: 20170320
Implantable Lead Location: 753860
Implantable Lead Model: 5076
Implantable Pulse Generator Implant Date: 20170320
Lead Channel Impedance Value: 437 Ohm
Lead Channel Impedance Value: 475 Ohm
Lead Channel Pacing Threshold Amplitude: 0.625 V
Lead Channel Pacing Threshold Pulse Width: 0.4 ms
Lead Channel Sensing Intrinsic Amplitude: 14.5 mV
Lead Channel Sensing Intrinsic Amplitude: 14.5 mV
Lead Channel Setting Pacing Amplitude: 2 V
Lead Channel Setting Pacing Pulse Width: 0.4 ms
Lead Channel Setting Sensing Sensitivity: 2 mV

## 2020-09-05 ENCOUNTER — Encounter (HOSPITAL_BASED_OUTPATIENT_CLINIC_OR_DEPARTMENT_OTHER): Payer: Self-pay | Admitting: Emergency Medicine

## 2020-09-05 ENCOUNTER — Other Ambulatory Visit: Payer: Self-pay

## 2020-09-05 ENCOUNTER — Emergency Department (HOSPITAL_BASED_OUTPATIENT_CLINIC_OR_DEPARTMENT_OTHER): Payer: Medicare Other

## 2020-09-05 ENCOUNTER — Inpatient Hospital Stay (HOSPITAL_BASED_OUTPATIENT_CLINIC_OR_DEPARTMENT_OTHER)
Admission: EM | Admit: 2020-09-05 | Discharge: 2020-09-09 | DRG: 445 | Disposition: A | Discharge status: Home / Self Care | Payer: Medicare Other | Attending: Internal Medicine | Admitting: Internal Medicine

## 2020-09-05 DIAGNOSIS — I77810 Thoracic aortic ectasia: Secondary | ICD-10-CM | POA: Diagnosis present

## 2020-09-05 DIAGNOSIS — E785 Hyperlipidemia, unspecified: Secondary | ICD-10-CM | POA: Diagnosis present

## 2020-09-05 DIAGNOSIS — K810 Acute cholecystitis: Secondary | ICD-10-CM

## 2020-09-05 DIAGNOSIS — K219 Gastro-esophageal reflux disease without esophagitis: Secondary | ICD-10-CM | POA: Diagnosis present

## 2020-09-05 DIAGNOSIS — K819 Cholecystitis, unspecified: Secondary | ICD-10-CM | POA: Diagnosis not present

## 2020-09-05 DIAGNOSIS — I77819 Aortic ectasia, unspecified site: Secondary | ICD-10-CM | POA: Diagnosis present

## 2020-09-05 DIAGNOSIS — I495 Sick sinus syndrome: Secondary | ICD-10-CM | POA: Diagnosis present

## 2020-09-05 DIAGNOSIS — Z01810 Encounter for preprocedural cardiovascular examination: Secondary | ICD-10-CM | POA: Diagnosis not present

## 2020-09-05 DIAGNOSIS — I4892 Unspecified atrial flutter: Secondary | ICD-10-CM | POA: Diagnosis present

## 2020-09-05 DIAGNOSIS — Z950 Presence of cardiac pacemaker: Secondary | ICD-10-CM | POA: Diagnosis not present

## 2020-09-05 DIAGNOSIS — Z79890 Hormone replacement therapy (postmenopausal): Secondary | ICD-10-CM

## 2020-09-05 DIAGNOSIS — Z20822 Contact with and (suspected) exposure to covid-19: Secondary | ICD-10-CM | POA: Diagnosis present

## 2020-09-05 DIAGNOSIS — Z7901 Long term (current) use of anticoagulants: Secondary | ICD-10-CM

## 2020-09-05 DIAGNOSIS — I340 Nonrheumatic mitral (valve) insufficiency: Secondary | ICD-10-CM | POA: Diagnosis not present

## 2020-09-05 DIAGNOSIS — Z79899 Other long term (current) drug therapy: Secondary | ICD-10-CM | POA: Diagnosis not present

## 2020-09-05 DIAGNOSIS — K8000 Calculus of gallbladder with acute cholecystitis without obstruction: Secondary | ICD-10-CM | POA: Diagnosis present

## 2020-09-05 DIAGNOSIS — I422 Other hypertrophic cardiomyopathy: Secondary | ICD-10-CM | POA: Diagnosis not present

## 2020-09-05 DIAGNOSIS — E871 Hypo-osmolality and hyponatremia: Secondary | ICD-10-CM | POA: Diagnosis not present

## 2020-09-05 DIAGNOSIS — I421 Obstructive hypertrophic cardiomyopathy: Secondary | ICD-10-CM | POA: Diagnosis present

## 2020-09-05 DIAGNOSIS — Z90710 Acquired absence of both cervix and uterus: Secondary | ICD-10-CM

## 2020-09-05 DIAGNOSIS — I4891 Unspecified atrial fibrillation: Secondary | ICD-10-CM | POA: Diagnosis present

## 2020-09-05 DIAGNOSIS — E7800 Pure hypercholesterolemia, unspecified: Secondary | ICD-10-CM | POA: Diagnosis present

## 2020-09-05 DIAGNOSIS — D472 Monoclonal gammopathy: Secondary | ICD-10-CM | POA: Diagnosis present

## 2020-09-05 DIAGNOSIS — E876 Hypokalemia: Secondary | ICD-10-CM | POA: Diagnosis present

## 2020-09-05 DIAGNOSIS — E039 Hypothyroidism, unspecified: Secondary | ICD-10-CM | POA: Diagnosis not present

## 2020-09-05 DIAGNOSIS — R1013 Epigastric pain: Secondary | ICD-10-CM | POA: Diagnosis not present

## 2020-09-05 DIAGNOSIS — R17 Unspecified jaundice: Secondary | ICD-10-CM

## 2020-09-05 DIAGNOSIS — Z934 Other artificial openings of gastrointestinal tract status: Secondary | ICD-10-CM | POA: Diagnosis not present

## 2020-09-05 DIAGNOSIS — I10 Essential (primary) hypertension: Secondary | ICD-10-CM | POA: Diagnosis present

## 2020-09-05 DIAGNOSIS — R109 Unspecified abdominal pain: Secondary | ICD-10-CM | POA: Diagnosis not present

## 2020-09-05 DIAGNOSIS — I4811 Longstanding persistent atrial fibrillation: Secondary | ICD-10-CM | POA: Diagnosis not present

## 2020-09-05 DIAGNOSIS — R1110 Vomiting, unspecified: Secondary | ICD-10-CM | POA: Diagnosis not present

## 2020-09-05 DIAGNOSIS — R001 Bradycardia, unspecified: Secondary | ICD-10-CM | POA: Diagnosis not present

## 2020-09-05 LAB — CBC WITH DIFFERENTIAL/PLATELET
Abs Immature Granulocytes: 0.08 10*3/uL — ABNORMAL HIGH (ref 0.00–0.07)
Basophils Absolute: 0 10*3/uL (ref 0.0–0.1)
Basophils Relative: 0 %
Eosinophils Absolute: 0.1 10*3/uL (ref 0.0–0.5)
Eosinophils Relative: 0 %
HCT: 38.4 % (ref 36.0–46.0)
Hemoglobin: 13.4 g/dL (ref 12.0–15.0)
Immature Granulocytes: 1 %
Lymphocytes Relative: 8 %
Lymphs Abs: 1.1 10*3/uL (ref 0.7–4.0)
MCH: 32.4 pg (ref 26.0–34.0)
MCHC: 34.9 g/dL (ref 30.0–36.0)
MCV: 92.8 fL (ref 80.0–100.0)
Monocytes Absolute: 0.9 10*3/uL (ref 0.1–1.0)
Monocytes Relative: 6 %
Neutro Abs: 12.5 10*3/uL — ABNORMAL HIGH (ref 1.7–7.7)
Neutrophils Relative %: 85 %
Platelets: 326 10*3/uL (ref 150–400)
RBC: 4.14 MIL/uL (ref 3.87–5.11)
RDW: 12.6 % (ref 11.5–15.5)
WBC: 14.7 10*3/uL — ABNORMAL HIGH (ref 4.0–10.5)
nRBC: 0 % (ref 0.0–0.2)

## 2020-09-05 LAB — COMPREHENSIVE METABOLIC PANEL
ALT: 18 U/L (ref 0–44)
AST: 33 U/L (ref 15–41)
Albumin: 3.3 g/dL — ABNORMAL LOW (ref 3.5–5.0)
Alkaline Phosphatase: 95 U/L (ref 38–126)
Anion gap: 11 (ref 5–15)
BUN: 16 mg/dL (ref 8–23)
CO2: 24 mmol/L (ref 22–32)
Calcium: 8.8 mg/dL — ABNORMAL LOW (ref 8.9–10.3)
Chloride: 95 mmol/L — ABNORMAL LOW (ref 98–111)
Creatinine, Ser: 0.96 mg/dL (ref 0.44–1.00)
GFR, Estimated: >60 mL/min (ref >60)
Glucose, Bld: 104 mg/dL — ABNORMAL HIGH (ref 70–99)
Potassium: 2.8 mmol/L — ABNORMAL LOW (ref 3.5–5.1)
Sodium: 130 mmol/L — ABNORMAL LOW (ref 135–145)
Total Bilirubin: 1.9 mg/dL — ABNORMAL HIGH (ref 0.3–1.2)
Total Protein: 6.8 g/dL (ref 6.5–8.1)

## 2020-09-05 LAB — URINALYSIS, ROUTINE W REFLEX MICROSCOPIC
Bilirubin Urine: NEGATIVE
Glucose, UA: NEGATIVE mg/dL
Ketones, ur: NEGATIVE mg/dL
Nitrite: NEGATIVE
Protein, ur: NEGATIVE mg/dL
Specific Gravity, Urine: 1.01 (ref 1.005–1.030)
pH: 6 (ref 5.0–8.0)

## 2020-09-05 LAB — MAGNESIUM: Magnesium: 1.7 mg/dL (ref 1.7–2.4)

## 2020-09-05 LAB — URINALYSIS, MICROSCOPIC (REFLEX)

## 2020-09-05 LAB — SARS CORONAVIRUS 2 BY RT PCR (HOSPITAL ORDER, PERFORMED IN ~~LOC~~ HOSPITAL LAB): SARS Coronavirus 2: NEGATIVE

## 2020-09-05 MED ORDER — ENOXAPARIN SODIUM 80 MG/0.8ML ~~LOC~~ SOLN
1.0000 mg/kg | Freq: Two times a day (BID) | SUBCUTANEOUS | Status: DC
  Administered 2020-09-05 – 2020-09-06 (×2): 80 mg via SUBCUTANEOUS
  Filled 2020-09-05 (×2): qty 0.8

## 2020-09-05 MED ORDER — SODIUM CHLORIDE 0.9 % IV BOLUS
500.0000 mL | Freq: Once | INTRAVENOUS | Status: AC
  Administered 2020-09-05: 500 mL via INTRAVENOUS

## 2020-09-05 MED ORDER — SODIUM CHLORIDE 0.9 % IV SOLN: INTRAVENOUS | Status: AC

## 2020-09-05 MED ORDER — POTASSIUM CHLORIDE 10 MEQ/100ML IV SOLN
10.0000 meq | INTRAVENOUS | Status: AC
  Administered 2020-09-05 (×2): 10 meq via INTRAVENOUS
  Filled 2020-09-05 (×2): qty 100

## 2020-09-05 MED ORDER — MAGNESIUM OXIDE 400 (241.3 MG) MG PO TABS
400.0000 mg | ORAL_TABLET | Freq: Two times a day (BID) | ORAL | Status: AC
  Administered 2020-09-05: 400 mg via ORAL
  Filled 2020-09-05: qty 1

## 2020-09-05 MED ORDER — ACETAMINOPHEN 325 MG PO TABS
650.0000 mg | ORAL_TABLET | Freq: Four times a day (QID) | ORAL | Status: DC | PRN
  Administered 2020-09-08 (×2): 650 mg via ORAL
  Filled 2020-09-05: qty 2

## 2020-09-05 MED ORDER — ONDANSETRON HCL 4 MG PO TABS: 4.0000 mg | ORAL_TABLET | Freq: Four times a day (QID) | ORAL | Status: DC | PRN

## 2020-09-05 MED ORDER — ACETAMINOPHEN 325 MG PO TABS
650.0000 mg | ORAL_TABLET | Freq: Four times a day (QID) | ORAL | Status: DC | PRN
  Filled 2020-09-05: qty 2

## 2020-09-05 MED ORDER — HYDROMORPHONE HCL 1 MG/ML IJ SOLN
0.5000 mg | INTRAMUSCULAR | Status: DC | PRN
  Administered 2020-09-06: 0.5 mg via INTRAVENOUS
  Filled 2020-09-05 (×2): qty 0.5

## 2020-09-05 MED ORDER — LEVOTHYROXINE SODIUM 125 MCG PO TABS
125.0000 ug | ORAL_TABLET | Freq: Every day | ORAL | Status: DC
  Administered 2020-09-08 – 2020-09-09 (×2): 125 ug via ORAL
  Filled 2020-09-05 (×3): qty 1

## 2020-09-05 MED ORDER — PIPERACILLIN-TAZOBACTAM 3.375 G IVPB
3.3750 g | Freq: Three times a day (TID) | INTRAVENOUS | Status: DC
  Administered 2020-09-05 – 2020-09-09 (×10): 3.375 g via INTRAVENOUS
  Filled 2020-09-05 (×9): qty 50

## 2020-09-05 MED ORDER — HYDROMORPHONE HCL 1 MG/ML IJ SOLN
0.5000 mg | Freq: Once | INTRAMUSCULAR | Status: AC
  Administered 2020-09-05: 0.5 mg via INTRAVENOUS
  Filled 2020-09-05: qty 1

## 2020-09-05 MED ORDER — HYDROMORPHONE HCL 1 MG/ML IJ SOLN
0.5000 mg | Freq: Once | INTRAMUSCULAR | Status: AC
Start: 2020-09-05 — End: 2020-09-05
  Administered 2020-09-05: 0.5 mg via INTRAVENOUS
  Filled 2020-09-05: qty 1

## 2020-09-05 MED ORDER — ONDANSETRON HCL 4 MG/2ML IJ SOLN
4.0000 mg | Freq: Four times a day (QID) | INTRAMUSCULAR | Status: DC | PRN
  Administered 2020-09-06: 06:00:00 4 mg via INTRAVENOUS
  Filled 2020-09-05: qty 2

## 2020-09-05 MED ORDER — KETOROLAC TROMETHAMINE 15 MG/ML IJ SOLN
15.0000 mg | Freq: Four times a day (QID) | INTRAMUSCULAR | Status: AC | PRN
  Administered 2020-09-05 – 2020-09-07 (×3): 15 mg via INTRAVENOUS
  Filled 2020-09-05 (×3): qty 1

## 2020-09-05 MED ORDER — PIPERACILLIN-TAZOBACTAM 3.375 G IVPB 30 MIN
3.3750 g | Freq: Once | INTRAVENOUS | Status: AC
  Administered 2020-09-05: 3.375 g via INTRAVENOUS
  Filled 2020-09-05: qty 50

## 2020-09-05 MED ORDER — ONDANSETRON HCL 4 MG/2ML IJ SOLN
4.0000 mg | Freq: Once | INTRAMUSCULAR | Status: AC
  Administered 2020-09-05: 4 mg via INTRAVENOUS
  Filled 2020-09-05: qty 2

## 2020-09-05 MED ORDER — RISAQUAD PO CAPS
1.0000 | ORAL_CAPSULE | Freq: Every day | ORAL | Status: DC
  Administered 2020-09-08 – 2020-09-09 (×2): 1 via ORAL
  Filled 2020-09-05 (×2): qty 1

## 2020-09-05 MED ORDER — VITAMIN B-12 1000 MCG PO TABS
1000.0000 ug | ORAL_TABLET | Freq: Every day | ORAL | Status: DC
  Administered 2020-09-05 – 2020-09-09 (×3): 1000 ug via ORAL
  Filled 2020-09-05 (×3): qty 1

## 2020-09-05 MED ORDER — IOHEXOL 300 MG/ML  SOLN
100.0000 mL | Freq: Once | INTRAMUSCULAR | Status: AC | PRN
  Administered 2020-09-05: 100 mL via INTRAVENOUS

## 2020-09-05 MED ORDER — VITAMIN D3 25 MCG (1000 UNIT) PO TABS
2000.0000 [IU] | ORAL_TABLET | Freq: Every day | ORAL | Status: DC
  Administered 2020-09-05 – 2020-09-09 (×3): 2000 [IU] via ORAL
  Filled 2020-09-05 (×3): qty 2

## 2020-09-05 MED ORDER — ACETAMINOPHEN 650 MG RE SUPP: 650.0000 mg | Freq: Four times a day (QID) | RECTAL | Status: DC | PRN

## 2020-09-05 MED ORDER — ATORVASTATIN CALCIUM 40 MG PO TABS
40.0000 mg | ORAL_TABLET | Freq: Every day | ORAL | Status: DC
  Administered 2020-09-05 – 2020-09-09 (×3): 40 mg via ORAL
  Filled 2020-09-05 (×3): qty 1

## 2020-09-05 MED ORDER — MAGNESIUM SULFATE 2 GM/50ML IV SOLN
2.0000 g | Freq: Once | INTRAVENOUS | Status: AC
  Administered 2020-09-05: 2 g via INTRAVENOUS
  Filled 2020-09-05: qty 50

## 2020-09-05 MED ORDER — POTASSIUM CHLORIDE CRYS ER 20 MEQ PO TBCR
40.0000 meq | EXTENDED_RELEASE_TABLET | ORAL | Status: AC
  Administered 2020-09-05 (×2): 40 meq via ORAL
  Filled 2020-09-05 (×2): qty 2

## 2020-09-05 MED ORDER — MORPHINE SULFATE (PF) 4 MG/ML IV SOLN
4.0000 mg | Freq: Once | INTRAVENOUS | Status: AC
  Administered 2020-09-05: 4 mg via INTRAVENOUS
  Filled 2020-09-05: qty 1

## 2020-09-05 NOTE — ED Triage Notes (Signed)
Pt arrives pov with driver, c/o RLQ pain and fever x 5 days, endorses hx of diverticulitis. Pt also reporting diarrhea. Pt also reports fatigue, reports having pacemaker

## 2020-09-05 NOTE — ED Notes (Signed)
Pt in CT.

## 2020-09-05 NOTE — Consult Note (Signed)
CC: RUQ pain  Requesting provider: Dr Roosevelt Locks  HPI: Tracey Mcclure is an 80 y.o. female who is here for RUQ pain that started 5 days ago.  Associated symptoms include diarrhea and developing nausea and vomiting.  She also had a fever to 101.  Pt is on chronic anticoagulation for A fib.  Last dose of elliquis was this AM.    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   Obesity    Palpitations     Past Surgical History:  Procedure Laterality Date   ABDOMINAL HYSTERECTOMY     EP IMPLANTABLE DEVICE N/A 10/26/2015   Procedure: Pacemaker Implant;  Surgeon: Evans Lance, MD;  Location: [facility];  Service: Cardiovascular;  Laterality: N/A;   OVARY SURGERY     BENIGN TUMOR   PACEMAKER REVISION N/A 09/01/2017   Procedure: PACEMAKER REVISION;  Surgeon: Constance Haw, MD;  Location: [facility];  Service: Cardiovascular;  Laterality: N/A;    Family History  Problem Relation Age of Onset   Heart attack Brother 25   Prostate cancer Brother    Breast cancer Sister 15    Social:  reports that she has never smoked. She has never used smokeless tobacco. She reports that she does not drink alcohol and does not use drugs.  Allergies:  Allergies  Allergen Reactions   Azithromycin Other (See Comments)    Patient states that she had severe stomach cramps for a solid 4 hours.     Medications: I have reviewed the patient's current medications.  Results for orders placed or performed during the hospital encounter of 09/05/20 (from the past 48 hour(s))   CBC with Differential     Status: Abnormal   Collection Time: 09/05/20 11:38 AM  Result Value Ref Range   WBC 14.7 (H) 4.0 - 10.5 K/uL   RBC 4.14 3.87 - 5.11 MIL/uL   Hemoglobin 13.4 12.0 - 15.0 g/dL   HCT 38.4 36.0 - 46.0 %   MCV 92.8 80.0 - 100.0 fL   MCH 32.4 26.0 - 34.0 pg   MCHC 34.9 30.0 - 36.0 g/dL   RDW 12.6 11.5 - 15.5 %   Platelets 326 150 - 400 K/uL   nRBC 0.0 0.0 - 0.2 %   Neutrophils Relative % 85 %   Neutro Abs 12.5 (H) 1.7 - 7.7 K/uL   Lymphocytes Relative 8 %   Lymphs Abs 1.1 0.7 - 4.0 K/uL   Monocytes Relative 6 %   Monocytes Absolute 0.9 0.1 - 1.0 K/uL   Eosinophils Relative 0 %   Eosinophils Absolute 0.1 0.0 - 0.5 K/uL   Basophils Relative 0 %   Basophils Absolute 0.0 0.0 - 0.1 K/uL   Immature Granulocytes 1 %   Abs Immature Granulocytes 0.08 (H) 0.00 - 0.07 K/uL    Comment: Performed at [facility], [address] [place], [place] [address]  Comprehensive metabolic panel     Status: Abnormal   Collection Time: 09/05/20 11:38 AM  Result Value Ref Range  Sodium 130 (L) 135 - 145 mmol/L   Potassium 2.8 (L) 3.5 - 5.1 mmol/L   Chloride 95 (L) 98 - 111 mmol/L   CO2 24 22 - 32 mmol/L   Glucose, Bld 104 (H) 70 - 99 mg/dL    Comment: Glucose reference range applies only to samples taken after fasting for at least 8 hours.   BUN 16 8 - 23 mg/dL   Creatinine, Ser 0.96 0.44 - 1.00 mg/dL   Calcium 8.8 (L) 8.9 - 10.3 mg/dL   Total Protein 6.8 6.5 - 8.1 g/dL   Albumin 3.3 (L) 3.5 - 5.0 g/dL   AST 33 15 - 41 U/L   ALT 18 0 - 44 U/L   Alkaline Phosphatase 95 38 - 126 U/L   Total Bilirubin 1.9 (H) 0.3 - 1.2 mg/dL   GFR, Estimated >60 >60 mL/min    Comment: (NOTE) Calculated using the CKD-EPI Creatinine Equation (2021)    Anion gap 11 5 - 15    Comment: Performed at [facility], [address] [place], [place] [address]  Urinalysis, Routine w reflex microscopic Urine, Clean Catch     Status: Abnormal   Collection Time: 09/05/20 11:38 AM   Result Value Ref Range   Color, Urine YELLOW YELLOW   APPearance HAZY (A) CLEAR   Specific Gravity, Urine 1.010 1.005 - 1.030   pH 6.0 5.0 - 8.0   Glucose, UA NEGATIVE NEGATIVE mg/dL   Hgb urine dipstick SMALL (A) NEGATIVE   Bilirubin Urine NEGATIVE NEGATIVE   Ketones, ur NEGATIVE NEGATIVE mg/dL   Protein, ur NEGATIVE NEGATIVE mg/dL   Nitrite NEGATIVE NEGATIVE   Leukocytes,Ua SMALL (A) NEGATIVE    Comment: Performed at [facility], [address] [place], [place] [address]  Magnesium     Status: None   Collection Time: 09/05/20 11:38 AM  Result Value Ref Range   Magnesium 1.7 1.7 - 2.4 mg/dL    Comment: Performed at [facility], [address] [place], [place] [address]  Urinalysis, Microscopic (reflex)     Status: Abnormal   Collection Time: 09/05/20 11:38 AM  Result Value Ref Range   RBC / HPF 0-5 0 - 5 RBC/hpf   WBC, UA 11-20 0 - 5 WBC/hpf   Bacteria, UA FEW (A) NONE SEEN   Squamous Epithelial / LPF 6-10 0 - 5   WBC Casts, UA PRESENT     Comment: Performed at [facility], [address] [place], [place] [address]  SARS Coronavirus 2 by RT PCR (hospital order, performed in [facility] hospital lab) Nasopharyngeal Nasopharyngeal Swab     Status: None   Collection Time: 09/05/20 12:35 PM   Specimen: Nasopharyngeal Swab  Result Value Ref Range   SARS Coronavirus 2 NEGATIVE NEGATIVE    Comment: (NOTE) SARS-CoV-2 target nucleic acids are NOT DETECTED.  The SARS-CoV-2 RNA is generally detectable in upper and lower respiratory specimens during the acute phase of infection. The lowest concentration of SARS-CoV-2 viral copies this assay can detect is 250 copies / mL. A negative result does not preclude SARS-CoV-2 infection and should not be used as the sole basis for treatment or other patient management decisions.  A negative result may occur with improper specimen collection / handling, submission of specimen other than nasopharyngeal  swab, presence of viral mutation(s) within the areas targeted by this assay, and inadequate number of viral copies (<250 copies / mL). A negative result  must be combined with clinical observations, patient history, and epidemiological information.  Fact Sheet for Patients:   StrictlyIdeas.no  Fact Sheet for Healthcare Providers: BankingDealers.co.za  This test is not yet approved or  cleared by the [place] FDA and has been authorized for detection and/or diagnosis of SARS-CoV-2 by FDA under an Emergency Use Authorization (EUA).  This EUA will remain in effect (meaning this test can be used) for the duration of the COVID-19 declaration under Section 564(b)(1) of the Act, 21 U.S.C. section 360bbb-3(b)(1), unless the authorization is terminated or revoked sooner.  Performed at [facility], [address] [place], [place] [address]     CT Abdomen Pelvis W Contrast  Result Date: 09/05/2020 CLINICAL DATA:  Right lower quadrant abdominal pain over the last 5 days with diarrhea, nausea, vomiting, and fever. EXAM: CT ABDOMEN AND PELVIS WITH CONTRAST TECHNIQUE: Multidetector CT imaging of the abdomen and pelvis was performed using the standard protocol following bolus administration of intravenous contrast. CONTRAST:  123mL OMNIPAQUE IOHEXOL 300 MG/ML  SOLN COMPARISON:  10/11/2013 FINDINGS: Lower chest: Dense mitral calcification. Pacer leads noted. Right coronary artery atherosclerotic calcification. Descending thoracic aortic atherosclerosis. Hepatobiliary: Striking wall thickening in the gallbladder with single wall mural thickness up to 1.8 cm. There is a 3.4 cm in long axis gallstone proximally in the gallbladder. The mild stranding in the pericholecystic tissues. No significant biliary dilatation. No significant focal liver lesion identified. Pancreas: Unremarkable Spleen: Unremarkable Adrenals/Urinary Tract: Adrenal glands  normal. Mild right hydronephrosis without hydroureter and without stone at the UPJ, query chronic mild UPJ narrowing. There is no significant asymmetry in contrast enhancement the kidneys. 0.7 cm hypodense lesion of the right kidney upper pole is likely a small cyst but technically too small to characterize. Stomach/Bowel: Sigmoid colon diverticulosis without active diverticulitis. No dilated bowel. Normal appendix. Vascular/Lymphatic: Aortoiliac atherosclerotic vascular disease. Reproductive: Uterus absent.  Adnexa unremarkable. Other: No supplemental non-categorized findings. 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 There is also grade 1 degenerative anterolisthesis at L4-5. Lumbar spondylosis and degenerative disc disease leading to multilevel foraminal impingement most notably on the left at L5-S1. IMPRESSION: 1. Striking wall thickening in the gallbladder with a 3.4 cm in long axis gallstone proximally in the gallbladder. Mild stranding in the pericholecystic tissues. Appearance favors acute cholecystitis, correlate with clinical presentation. No CBD dilatation. 2. Other imaging findings of potential clinical significance: Coronary atherosclerosis. Dense mitral calcification. Sigmoid colon diverticulosis. 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 Lumbar spondylosis and degenerative disc disease leading to multilevel foraminal impingement most notably on the left at L5-S1. 3. Aortic atherosclerosis. Aortic Atherosclerosis (ICD10-I70.0). Electronically Signed   By: Van Clines M.D.   On: 09/05/2020 12:30    ROS - all of the below systems have been reviewed with the patient and positives are indicated with bold text General: chills, fever or night sweats Eyes: blurry vision or double vision ENT: epistaxis or sore throat Allergy/Immunology: itchy/watery eyes or nasal congestion Hematologic/Lymphatic: bleeding  problems, blood clots or swollen lymph nodes Endocrine: temperature intolerance or unexpected weight changes Breast: new or changing breast lumps or nipple discharge Resp: cough, shortness of breath, or wheezing CV: chest pain or dyspnea on exertion GI: as per HPI GU: dysuria, trouble voiding, or hematuria MSK: joint pain or joint stiffness Neuro: TIA or stroke symptoms Derm: pruritus and skin lesion changes Psych: anxiety and depression  PE Blood pressure 120/66, pulse [name] Kitchen)  58, temperature (!) 97.4 F (36.3 C), temperature source Oral, resp. rate 16, height 5\' 4"  (1.626 m), weight 78.8 kg, SpO2 100 %. Constitutional: NAD; conversant; no deformities Eyes: Moist conjunctiva; no lid lag; anicteric; PERRL Neck: Trachea midline; no thyromegaly Lungs: Normal respiratory effort; no tactile fremitus CV: RRR; no palpable thrills; no pitting edema GI: Abd TTP RUQ; no palpable hepatosplenomegaly MSK: Normal range of motion of extremities; no clubbing/cyanosis Psychiatric: Appropriate affect; alert and oriented x3 Lymphatic: No palpable cervical or axillary lymphadenopathy  Results for orders placed or performed during the hospital encounter of 09/05/20 (from the past 48 hour(s))  CBC with Differential     Status: Abnormal   Collection Time: 09/05/20 11:38 AM  Result Value Ref Range   WBC 14.7 (H) 4.0 - 10.5 K/uL   RBC 4.14 3.87 - 5.11 MIL/uL   Hemoglobin 13.4 12.0 - 15.0 g/dL   HCT 38.4 36.0 - 46.0 %   MCV 92.8 80.0 - 100.0 fL   MCH 32.4 26.0 - 34.0 pg   MCHC 34.9 30.0 - 36.0 g/dL   RDW 12.6 11.5 - 15.5 %   Platelets 326 150 - 400 K/uL   nRBC 0.0 0.0 - 0.2 %   Neutrophils Relative % 85 %   Neutro Abs 12.5 (H) 1.7 - 7.7 K/uL   Lymphocytes Relative 8 %   Lymphs Abs 1.1 0.7 - 4.0 K/uL   Monocytes Relative 6 %   Monocytes Absolute 0.9 0.1 - 1.0 K/uL   Eosinophils Relative 0 %   Eosinophils Absolute 0.1 0.0 - 0.5 K/uL   Basophils Relative 0 %   Basophils Absolute 0.0 0.0 - 0.1 K/uL    Immature Granulocytes 1 %   Abs Immature Granulocytes 0.08 (H) 0.00 - 0.07 K/uL    Comment: Performed at [facility], [address] [place], [place] [address]  Comprehensive metabolic panel     Status: Abnormal   Collection Time: 09/05/20 11:38 AM  Result Value Ref Range   Sodium 130 (L) 135 - 145 mmol/L   Potassium 2.8 (L) 3.5 - 5.1 mmol/L   Chloride 95 (L) 98 - 111 mmol/L   CO2 24 22 - 32 mmol/L   Glucose, Bld 104 (H) 70 - 99 mg/dL    Comment: Glucose reference range applies only to samples taken after fasting for at least 8 hours.   BUN 16 8 - 23 mg/dL   Creatinine, Ser 0.96 0.44 - 1.00 mg/dL   Calcium 8.8 (L) 8.9 - 10.3 mg/dL   Total Protein 6.8 6.5 - 8.1 g/dL   Albumin 3.3 (L) 3.5 - 5.0 g/dL   AST 33 15 - 41 U/L   ALT 18 0 - 44 U/L   Alkaline Phosphatase 95 38 - 126 U/L   Total Bilirubin 1.9 (H) 0.3 - 1.2 mg/dL   GFR, Estimated >60 >60 mL/min    Comment: (NOTE) Calculated using the CKD-EPI Creatinine Equation (2021)    Anion gap 11 5 - 15    Comment: Performed at [facility], [address] [place], [place] [address]  Urinalysis, Routine w reflex microscopic Urine, Clean Catch     Status: Abnormal   Collection Time: 09/05/20 11:38 AM  Result Value Ref Range   Color, Urine YELLOW YELLOW   APPearance HAZY (A) CLEAR   Specific Gravity, Urine 1.010 1.005 - 1.030   pH 6.0 5.0 - 8.0   Glucose, UA NEGATIVE NEGATIVE mg/dL   Hgb urine dipstick SMALL (  A) NEGATIVE   Bilirubin Urine NEGATIVE NEGATIVE   Ketones, ur NEGATIVE NEGATIVE mg/dL   Protein, ur NEGATIVE NEGATIVE mg/dL   Nitrite NEGATIVE NEGATIVE   Leukocytes,Ua SMALL (A) NEGATIVE    Comment: Performed at [facility], [address] [place], [place] [address]  Magnesium     Status: None   Collection Time: 09/05/20 11:38 AM  Result Value Ref Range   Magnesium 1.7 1.7 - 2.4 mg/dL    Comment: Performed at [facility], [address] [place], [place] [address]  Urinalysis,  Microscopic (reflex)     Status: Abnormal   Collection Time: 09/05/20 11:38 AM  Result Value Ref Range   RBC / HPF 0-5 0 - 5 RBC/hpf   WBC, UA 11-20 0 - 5 WBC/hpf   Bacteria, UA FEW (A) NONE SEEN   Squamous Epithelial / LPF 6-10 0 - 5   WBC Casts, UA PRESENT     Comment: Performed at [facility], [address] [place], [place] [address]  SARS Coronavirus 2 by RT PCR (hospital order, performed in [facility] hospital lab) Nasopharyngeal Nasopharyngeal Swab     Status: None   Collection Time: 09/05/20 12:35 PM   Specimen: Nasopharyngeal Swab  Result Value Ref Range   SARS Coronavirus 2 NEGATIVE NEGATIVE    Comment: (NOTE) SARS-CoV-2 target nucleic acids are NOT DETECTED.  The SARS-CoV-2 RNA is generally detectable in upper and lower respiratory specimens during the acute phase of infection. The lowest concentration of SARS-CoV-2 viral copies this assay can detect is 250 copies / mL. A negative result does not preclude SARS-CoV-2 infection and should not be used as the sole basis for treatment or other patient management decisions.  A negative result may occur with improper specimen collection / handling, submission of specimen other than nasopharyngeal swab, presence of viral mutation(s) within the areas targeted by this assay, and inadequate number of viral copies (<250 copies / mL). A negative result must be combined with clinical observations, patient history, and epidemiological information.  Fact Sheet for Patients:   StrictlyIdeas.no  Fact Sheet for Healthcare Providers: BankingDealers.co.za  This test is not yet approved or  cleared by the [place] FDA and has been authorized for detection and/or diagnosis of SARS-CoV-2 by FDA under an Emergency Use Authorization (EUA).  This EUA will remain in effect (meaning this test can be used) for the duration of the COVID-19 declaration under Section 564(b)(1) of the  Act, 21 U.S.C. section 360bbb-3(b)(1), unless the authorization is terminated or revoked sooner.  Performed at [facility], [address] [place], [place] [address]     CT Abdomen Pelvis W Contrast  Result Date: 09/05/2020 CLINICAL DATA:  Right lower quadrant abdominal pain over the last 5 days with diarrhea, nausea, vomiting, and fever. EXAM: CT ABDOMEN AND PELVIS WITH CONTRAST TECHNIQUE: Multidetector CT imaging of the abdomen and pelvis was performed using the standard protocol following bolus administration of intravenous contrast. CONTRAST:  114mL OMNIPAQUE IOHEXOL 300 MG/ML  SOLN COMPARISON:  10/11/2013 FINDINGS: Lower chest: Dense mitral calcification. Pacer leads noted. Right coronary artery atherosclerotic calcification. Descending thoracic aortic atherosclerosis. Hepatobiliary: Striking wall thickening in the gallbladder with single wall mural thickness up to 1.8 cm. There is a 3.4 cm in long axis gallstone proximally in the gallbladder. The mild stranding in the pericholecystic tissues. No significant biliary dilatation. No significant focal liver lesion identified. Pancreas: Unremarkable Spleen: Unremarkable Adrenals/Urinary Tract: Adrenal  glands normal. Mild right hydronephrosis without hydroureter and without stone at the UPJ, query chronic mild UPJ narrowing. There is no significant asymmetry in contrast enhancement the kidneys. 0.7 cm hypodense lesion of the right kidney upper pole is likely a small cyst but technically too small to characterize. Stomach/Bowel: Sigmoid colon diverticulosis without active diverticulitis. No dilated bowel. Normal appendix. Vascular/Lymphatic: Aortoiliac atherosclerotic vascular disease. Reproductive: Uterus absent.  Adnexa unremarkable. Other: No supplemental non-categorized findings. 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 There is also grade 1 degenerative anterolisthesis at L4-5. Lumbar spondylosis  and degenerative disc disease leading to multilevel foraminal impingement most notably on the left at L5-S1. IMPRESSION: 1. Striking wall thickening in the gallbladder with a 3.4 cm in long axis gallstone proximally in the gallbladder. Mild stranding in the pericholecystic tissues. Appearance favors acute cholecystitis, correlate with clinical presentation. No CBD dilatation. 2. Other imaging findings of potential clinical significance: Coronary atherosclerosis. Dense mitral calcification. Sigmoid colon diverticulosis. 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 Lumbar spondylosis and degenerative disc disease leading to multilevel foraminal impingement most notably on the left at L5-S1. 3. Aortic atherosclerosis. Aortic Atherosclerosis (ICD10-I70.0). Electronically Signed   By: Van Clines M.D.   On: 09/05/2020 12:30     A/P: Tracey Mcclure is an 80 y.o. female with acute cholecystitis.  Cannot go to the OR right now due to anticoagulation.  Agree with cardiac consult for preop evaluation and hold DOAC.  Will decide over the next 48h whether she will need operative intervention or possible IR drain.  Limited PO liquids and IV abx.  Recheck labs in AM.   Rosario Adie, MD  Colorectal and [facility] [facility]

## 2020-09-05 NOTE — ED Notes (Signed)
I left message on nephew's cell regarding room received at [facility]

## 2020-09-05 NOTE — H&P (Addendum)
History and Physical    Tracey Mcclure GYJ:[id number] DOB: 04-04-1941 DOA: 09/05/2020  PCP: Crist Infante, MD (Confirm with patient/family/NH records and if not entered, this has to be entered at [facility] point of entry) Patient coming from: Home  I have personally briefly reviewed patient's old medical records in [place]  Chief Complaint: Abd pain, nauseous vomit  HPI: Tracey Mcclure is a 80 y.o. female with medical history significant of asymmetrical hypertrophic cardiomyopathy, A. fib on Eliquis, SSS status post PPM, HLD, hypothyroidism, MGUS,  presented with new onset of RUQ pain, diarrhea and feeling nausea.  Symptoms started 5 days ago, initially with epigastric pain cramping like, associated with loose diarrhea 2-3 times a day.  She thought it was her diverticulitis came back and she did bowel rest and was hoping that episode will pass by its own.  Diarrhea did stop 3 days ago, however she developed a feeling of nausea and frequent vomiting after eating and last 2 days she has been able to eat or drink much.  Spiked fever 101 2 nights ago.  She denied any chest pain, cough urinary symptoms.  ED Course: CT abdomen showed thickening of the gallbladder wall with a 3.4 cm long axis gallstone, and then fevers acute cholecystitis.  WBC 14.7.  Vital signs stable no fever.  Review of Systems: As per HPI otherwise 14 point review of systems negative.    Past Medical History:  Diagnosis Date  . Atrial fibrillation ([place]) 12/05/2013  . Colon adenoma   . Gastric mass 10/14/2013  . GERD (gastroesophageal reflux disease)   . HTN (hypertension)    CONTROLLED  . Hypercholesteremia   . Hypertr obst cardiomyop   . Hypothyroidism   . Iron deficiency anemia, unspecified 10/14/2013  . 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  . Mitral insufficiency   . 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  . Obesity   . Palpitations     Past Surgical History:   Procedure Laterality Date  . ABDOMINAL HYSTERECTOMY    . EP IMPLANTABLE DEVICE N/A 10/26/2015   Procedure: Pacemaker Implant;  Surgeon: Evans Lance, MD;  Location: [facility];  Service: Cardiovascular;  Laterality: N/A;  . OVARY SURGERY     BENIGN TUMOR  . PACEMAKER REVISION N/A 09/01/2017   Procedure: PACEMAKER REVISION;  Surgeon: Constance Haw, MD;  Location: [facility];  Service: Cardiovascular;  Laterality: N/A;     reports that she has never smoked. She has never used smokeless tobacco. She reports that she does not drink alcohol and does not use drugs.  Allergies  Allergen Reactions  . Azithromycin Other (See Comments)    Patient states that she had severe stomach cramps for a solid 4 hours.     Family History  Problem Relation Age of Onset  . Heart attack Brother 85  . Prostate cancer Brother   . Breast cancer Sister 59     Prior to Admission medications   Medication Sig Start Date End Date Taking? Authorizing Provider  acetaminophen (TYLENOL) 325 MG tablet Take 650 mg by mouth every 6 (six) hours as needed for mild pain or headache.     [provider]  atorvastatin (LIPITOR) 40 MG tablet TAKE 1 TABLET BY MOUTH EVERYDAY AT BEDTIME 08/12/20   [place], Peter M, MD  Cholecalciferol (VITAMIN D) 2000 units CAPS Take 2,000 Units by mouth daily.     [provider]  DiphenhydrAMINE HCl (BENADRYL PO)  Take 25 mg by mouth at bedtime as needed (for sleep).     [provider]  ELIQUIS 5 MG TABS tablet TAKE 1 TABLET BY MOUTH TWICE A DAY 07/24/20   [place], Peter M, MD  hydrochlorothiazide (HYDRODIURIL) 25 MG tablet Take 0.5 tablets (12.5 mg total) by mouth daily. 06/06/16   [place], Peter M, MD  hydrocortisone cream 0.5 % Apply 1 application topically daily as needed for itching.    [provider]  levothyroxine (SYNTHROID, LEVOTHROID) 125 MCG tablet Take 125 mcg by mouth daily. Every day except Sundays.    [provider]  Probiotic Product (PROBIOTIC DAILY PO) Take 1 tablet by mouth daily.     [provider]  vitamin B-12 (CYANOCOBALAMIN) 1000 MCG tablet Take 1,000 mcg by mouth daily.    [provider]    Physical Exam: Vitals:   09/05/20 1330 09/05/20 1400 09/05/20 1430 09/05/20 1616  BP: (!) 129/56 124/63 (!) 115/54 (!) 131/55  Pulse: (!) 57 60 (!) 59 63  Resp: 19 (!) 22 15 18   Temp:    (!) 97.4 F (36.3 C)  TempSrc:    Oral  SpO2: 99% 100% 97% 99%  Weight:      Height:        Constitutional: NAD, calm, comfortable Vitals:   09/05/20 1330 09/05/20 1400 09/05/20 1430 09/05/20 1616  BP: (!) 129/56 124/63 (!) 115/54 (!) 131/55  Pulse: (!) 57 60 (!) 59 63  Resp: 19 (!) 22 15 18   Temp:    (!) 97.4 F (36.3 C)  TempSrc:    Oral  SpO2: 99% 100% 97% 99%  Weight:      Height:       Eyes: PERRL, lids and conjunctivae normal ENMT: Mucous membranes are moist. Posterior pharynx clear of any exudate or lesions.Normal dentition.  Neck: normal, supple, no masses, no thyromegaly Respiratory: clear to auscultation bilaterally, no wheezing, no crackles. Normal respiratory effort. No accessory muscle use.  Cardiovascular: Regular rate and rhythm, no murmurs / rubs / gallops. No extremity edema. 2+ pedal pulses. No carotid bruits.  Abdomen: RUQ tenderness, positive Murphy sign, no masses palpated. No hepatosplenomegaly. Bowel sounds positive.  Musculoskeletal: no clubbing / cyanosis. No joint deformity upper and lower extremities. Good ROM, no contractures. Normal muscle tone.  Skin: no rashes, lesions, ulcers. No induration Neurologic: CN 2-12 grossly intact. Sensation intact, DTR normal. Strength 5/5 in all 4.  Psychiatric: Normal judgment and insight. Alert and oriented x 3. Normal mood.     Labs on Admission: I have personally reviewed following labs and imaging studies  CBC: Recent Labs  Lab 09/05/20 1138  WBC 14.7*  NEUTROABS 12.5*  HGB 13.4  HCT 38.4  MCV 92.8  PLT  [id number]   Basic Metabolic Panel: Recent Labs  Lab 09/05/20 1138  NA 130*  K 2.8*  CL 95*  CO2 24  GLUCOSE 104*  BUN 16  CREATININE 0.96  CALCIUM 8.8*  MG 1.7   GFR: Estimated Creatinine Clearance: 48.2 mL/min (by C-G formula based on SCr of 0.96 mg/dL). Liver Function Tests: Recent Labs  Lab 09/05/20 1138  AST 33  ALT 18  ALKPHOS 95  BILITOT 1.9*  PROT 6.8  ALBUMIN 3.3*   No results for input(s): LIPASE, AMYLASE in the last 168 hours. No results for input(s): AMMONIA in the last 168 hours. Coagulation Profile: No results for input(s): INR, PROTIME in the last 168 hours. Cardiac Enzymes: No results for input(s): CKTOTAL, CKMB, CKMBINDEX,  TROPONINI in the last 168 hours. BNP (last 3 results) No results for input(s): PROBNP in the last 8760 hours. HbA1C: No results for input(s): HGBA1C in the last 72 hours. CBG: No results for input(s): GLUCAP in the last 168 hours. Lipid Profile: No results for input(s): CHOL, HDL, LDLCALC, TRIG, CHOLHDL, LDLDIRECT in the last 72 hours. Thyroid Function Tests: No results for input(s): TSH, T4TOTAL, FREET4, T3FREE, THYROIDAB in the last 72 hours. Anemia Panel: No results for input(s): VITAMINB12, FOLATE, FERRITIN, TIBC, IRON, RETICCTPCT in the last 72 hours. Urine analysis:    Component Value Date/Time   COLORURINE YELLOW 09/05/2020 1138   APPEARANCEUR HAZY (A) 09/05/2020 1138   LABSPEC 1.010 09/05/2020 1138   PHURINE 6.0 09/05/2020 1138   GLUCOSEU NEGATIVE 09/05/2020 1138   HGBUR SMALL (A) 09/05/2020 1138   BILIRUBINUR NEGATIVE 09/05/2020 1138   KETONESUR NEGATIVE 09/05/2020 1138   PROTEINUR NEGATIVE 09/05/2020 1138   NITRITE NEGATIVE 09/05/2020 1138   LEUKOCYTESUR SMALL (A) 09/05/2020 1138    Radiological Exams on Admission: CT Abdomen Pelvis W Contrast  Result Date: 09/05/2020 CLINICAL DATA:  Right lower quadrant abdominal pain over the last 5 days with diarrhea, nausea, vomiting, and fever. EXAM: CT ABDOMEN AND PELVIS  WITH CONTRAST TECHNIQUE: Multidetector CT imaging of the abdomen and pelvis was performed using the standard protocol following bolus administration of intravenous contrast. CONTRAST:  151mL OMNIPAQUE IOHEXOL 300 MG/ML  SOLN COMPARISON:  10/11/2013 FINDINGS: Lower chest: Dense mitral calcification. Pacer leads noted. Right coronary artery atherosclerotic calcification. Descending thoracic aortic atherosclerosis. Hepatobiliary: Striking wall thickening in the gallbladder with single wall mural thickness up to 1.8 cm. There is a 3.4 cm in long axis gallstone proximally in the gallbladder. The mild stranding in the pericholecystic tissues. No significant biliary dilatation. No significant focal liver lesion identified. Pancreas: Unremarkable Spleen: Unremarkable Adrenals/Urinary Tract: Adrenal glands normal. Mild right hydronephrosis without hydroureter and without stone at the UPJ, query chronic mild UPJ narrowing. There is no significant asymmetry in contrast enhancement the kidneys. 0.7 cm hypodense lesion of the right kidney upper pole is likely a small cyst but technically too small to characterize. Stomach/Bowel: Sigmoid colon diverticulosis without active diverticulitis. No dilated bowel. Normal appendix. Vascular/Lymphatic: Aortoiliac atherosclerotic vascular disease. Reproductive: Uterus absent.  Adnexa unremarkable. Other: No supplemental non-categorized findings. 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 There is also grade 1 degenerative anterolisthesis at L4-5. Lumbar spondylosis and degenerative disc disease leading to multilevel foraminal impingement most notably on the left at L5-S1. IMPRESSION: 1. Striking wall thickening in the gallbladder with a 3.4 cm in long axis gallstone proximally in the gallbladder. Mild stranding in the pericholecystic tissues. Appearance favors acute cholecystitis, correlate with clinical presentation. No CBD dilatation. 2. Other  imaging findings of potential clinical significance: Coronary atherosclerosis. Dense mitral calcification. Sigmoid colon diverticulosis. 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 Lumbar spondylosis and degenerative disc disease leading to multilevel foraminal impingement most notably on the left at L5-S1. 3. Aortic atherosclerosis. Aortic Atherosclerosis (ICD10-I70.0). Electronically Signed   By: Van Clines M.D.   On: 09/05/2020 12:30    EKG: Independently reviewed.  Baseline A-flutter and ventricular paced rhythm  Assessment/Plan Active Problems:   Cholecystitis  (please populate well all problems here in Problem List. (For example, if patient is on BP meds at home and you resume or decide to hold them, it is a problem that needs to be her. Same for CAD, COPD, HLD  and so on)   Acute cholecystitis with large gallstone -Surgeon informed, however given the patient extensive cardio history, surgeon recommend conservative management with IV antibiotics. -Surgeon plans to follow-up with patient in [place] and if decide surgery is necessary will consult cardiology to manage peri-operative cardio management. -No symptoms signs of ascending cholangitis, continue current antibiotic regimen of Zosyn. -Trial of liquid diet  A. fib/A-flutter -We will change Eliquis to twice daily Lovenox  Hypokalemia -Replaced. Two doses of Mg  Asymmetrical hypertrophic cardiomyopathy -Stable  MGUS -No acute issue   DVT prophylaxis: Lovenox twice daily  code Status: Full code Family Communication: None at bedside Disposition Plan: Expect more than 2 midnight hospital stay for IV antibiotics and possible OR Consults called: General surgery Admission status: MedSurg admission   Lequita Halt MD [facility] Pager [phone]  09/05/2020, 5:12 PM

## 2020-09-05 NOTE — ED Notes (Signed)
Pt aware she has assigned bed and that we are waiting on transport

## 2020-09-05 NOTE — ED Provider Notes (Signed)
[facility] Provider Note   CSN: [id number] Arrival date & time: 09/05/20  1104    History Chief Complaint  Patient presents with  . Abdominal Pain    Tracey Mcclure is a 80 y.o. female with past medical history significant for A. Fib, on Eliquis, type I AV block, s/p pacemaker, hypertension, hypercholesterolemia, gastric mass who presents for evaluation abdominal pain. Pain to lower abdomen x5 days.  Initially began to right lower quadrant however is diffusely located to her lower abdomen. Occasionally to RU abdomen. Had diarrhea on Monday and Tuesday resolved and has not had bowel movement over the last 3 days.  She is passing flatulence.  Has been nauseous.  Has not had an appetite, little PO intake over last 4 days. Occasional sips of water. Pain significantly worse with food intake, as such as not been eating.  Apparently PCP sent in prescription for doxycycline however she is not able to start this due to nausea.  Fever yesterday thinks it was 101.  No headache, lightheadedness, dizziness, chest pain, shortness of breath, hemotysis, dysuria, hematuria, unilateral weakness.  Patient does feel fatigued.  No known Covid exposures.  States if she lays still her pain improves.  Her current pain is a 5/10.  Denies additional aggravating or alleviating factors.  History obtained from patient and past medical records.  No interpreter used.  COVID vaccinated and booster  Last PO intake sips of water this morning, solid food 2 days ago.   HPI     Past Medical History:  Diagnosis Date  . Atrial fibrillation ([place]) 12/05/2013  . Colon adenoma   . Gastric mass 10/14/2013  . GERD (gastroesophageal reflux disease)   . HTN (hypertension)    CONTROLLED  . Hypercholesteremia   . Hypertr obst cardiomyop   . Hypothyroidism   . Iron deficiency anemia, unspecified 10/14/2013  . 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  . Mitral insufficiency   .  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  . Obesity   . Palpitations     Patient Active Problem List   Diagnosis Date Noted  . Cholecystitis 09/05/2020  . Pacemaker 10/01/2019  . 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 Sinus bradycardia 03/30/2015  . Atrial fibrillation ([place]) 12/05/2013  . Gastric mass 10/14/2013  . 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  . 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  . Hypertrophic obstructive cardiomyopathy ([place])   . Mitral insufficiency   . HTN (hypertension)   . Palpitations   . Hypercholesteremia   . Hypothyroidism   . GERD (gastroesophageal reflux disease)   . Obesity   . Colon adenoma     Past Surgical History:  Procedure Laterality Date  . ABDOMINAL HYSTERECTOMY    . EP IMPLANTABLE DEVICE N/A 10/26/2015   Procedure: Pacemaker Implant;  Surgeon: Evans Lance, MD;  Location: [facility];  Service: Cardiovascular;  Laterality: N/A;  . OVARY SURGERY     BENIGN TUMOR  . PACEMAKER REVISION N/A 09/01/2017   Procedure: PACEMAKER REVISION;  Surgeon: Constance Haw, MD;  Location: [facility];  Service: Cardiovascular;  Laterality: N/A;     OB History   No obstetric history on file.     Family History  Problem Relation Age of Onset  . Heart attack Brother 41  . Prostate cancer Brother   . Breast cancer Sister 21    Social History   Tobacco Use  . Smoking  status: Never Smoker  . Smokeless tobacco: Never Used  Vaping Use  . Vaping Use: Never used  Substance Use Topics  . Alcohol use: No  . Drug use: No    Home Medications Prior to Admission medications   Medication Sig Start Date End Date Taking? Authorizing Provider  acetaminophen (TYLENOL) 325 MG tablet Take 650 mg by mouth every 6 (six) hours as needed for mild pain or headache.     [provider]  atorvastatin (LIPITOR) 40 MG tablet TAKE 1 TABLET BY MOUTH EVERYDAY AT BEDTIME  08/12/20   [place], Peter M, MD  Cholecalciferol (VITAMIN D) 2000 units CAPS Take 2,000 Units by mouth daily.     [provider]  DiphenhydrAMINE HCl (BENADRYL PO) Take 25 mg by mouth at bedtime as needed (for sleep).     [provider]  ELIQUIS 5 MG TABS tablet TAKE 1 TABLET BY MOUTH TWICE A DAY 07/24/20   [place], Peter M, MD  hydrochlorothiazide (HYDRODIURIL) 25 MG tablet Take 0.5 tablets (12.5 mg total) by mouth daily. 06/06/16   [place], Peter M, MD  hydrocortisone cream 0.5 % Apply 1 application topically daily as needed for itching.    [provider]  levothyroxine (SYNTHROID, LEVOTHROID) 125 MCG tablet Take 125 mcg by mouth daily. Every day except Sundays.    [provider]  Probiotic Product (PROBIOTIC DAILY PO) Take 1 tablet by mouth daily.     [provider]  vitamin B-12 (CYANOCOBALAMIN) 1000 MCG tablet Take 1,000 mcg by mouth daily.    [provider]    Allergies    Azithromycin  Review of Systems   Review of Systems  Constitutional: Positive for activity change, appetite change, fatigue and fever.  HENT: Negative.   Respiratory: Negative.   Cardiovascular: Negative.   Gastrointestinal: Positive for abdominal pain (Lower abd), diarrhea and nausea. Negative for abdominal distention, anal bleeding, blood in stool, constipation, rectal pain and vomiting.  Genitourinary: Negative.   Musculoskeletal: Negative.   Skin: Negative.   Neurological: Positive for weakness (Generalized). Negative for dizziness, tremors, seizures, syncope, speech difficulty, light-headedness, numbness and headaches.  All other systems reviewed and are negative.  Physical Exam Updated Vital Signs BP (!) 120/54   Pulse (!) 59   Temp 97.9 F (36.6 C) (Oral)   Resp 18   Ht 5\' 4"  (1.626 m)   Wt 78.8 kg   LMP  (LMP Unknown)   SpO2 98%   BMI 29.82 kg/m   Physical Exam Vitals and nursing note reviewed.  Constitutional:      General: She is not  in acute distress.    Appearance: She is well-developed and well-nourished. She is not ill-appearing, toxic-appearing or diaphoretic.  HENT:     Head: Normocephalic and atraumatic.     Mouth/Throat:     Mouth: Mucous membranes are moist.  Eyes:     Pupils: Pupils are equal, round, and reactive to light.  Cardiovascular:     Rate and Rhythm: Normal rate. Rhythm irregular.     Pulses: Intact distal pulses.     Heart sounds: Normal heart sounds.  Pulmonary:     Effort: Pulmonary effort is normal. No respiratory distress.     Breath sounds: Normal breath sounds.  Abdominal:     General: Bowel sounds are normal. There is no distension.     Palpations: Abdomen is soft.     Tenderness: There is abdominal tenderness in the right upper quadrant, right lower quadrant and suprapubic  area. There is no right CVA tenderness, left CVA tenderness, guarding or rebound.     Hernia: No hernia is present.     Comments: Diffuse tenderness to Right abdomen. No guarding, rebound  Musculoskeletal:        General: Normal range of motion.     Cervical back: Normal range of motion.  Skin:    General: Skin is warm and dry.     Capillary Refill: Capillary refill takes less than 2 seconds.  Neurological:     General: No focal deficit present.     Mental Status: She is alert and oriented to person, place, and time.  Psychiatric:        Mood and Affect: Mood and affect normal.    ED Results / Procedures / Treatments   Labs (all labs ordered are listed, but only abnormal results are displayed) Labs Reviewed  CBC WITH DIFFERENTIAL/PLATELET - Abnormal; Notable for the following components:      Result Value   WBC 14.7 (*)    Neutro Abs 12.5 (*)    Abs Immature Granulocytes 0.08 (*)    All other components within normal limits  COMPREHENSIVE METABOLIC PANEL - Abnormal; Notable for the following components:   Sodium 130 (*)    Potassium 2.8 (*)    Chloride 95 (*)    Glucose, Bld 104 (*)    Calcium 8.8  (*)    Albumin 3.3 (*)    Total Bilirubin 1.9 (*)    All other components within normal limits  URINALYSIS, ROUTINE W REFLEX MICROSCOPIC - Abnormal; Notable for the following components:   APPearance HAZY (*)    Hgb urine dipstick SMALL (*)    Leukocytes,Ua SMALL (*)    All other components within normal limits  URINALYSIS, MICROSCOPIC (REFLEX) - Abnormal; Notable for the following components:   Bacteria, UA FEW (*)    All other components within normal limits  URINE CULTURE  SARS CORONAVIRUS 2 BY RT PCR (HOSPITAL ORDER, [facility])  MAGNESIUM    EKG EKG Interpretation  Date/Time:  Saturday September 05 2020 11:20:34 EST Ventricular Rate:  65 PR Interval:    QRS Duration: 180 QT Interval:  499 QTC Calculation: 519 R Axis:   -74 Text Interpretation: Ectopic atrial rhythm Short PR interval Probable left atrial enlargement LVH with IVCD, LAD and secondary repol abnrm Prolonged QT interval No acute changes No significant change since last tracing Confirmed by Varney Biles [phone]) on 09/05/2020 11:34:14 AM   Radiology CT Abdomen Pelvis W Contrast  Result Date: 09/05/2020 CLINICAL DATA:  Right lower quadrant abdominal pain over the last 5 days with diarrhea, nausea, vomiting, and fever. EXAM: CT ABDOMEN AND PELVIS WITH CONTRAST TECHNIQUE: Multidetector CT imaging of the abdomen and pelvis was performed using the standard protocol following bolus administration of intravenous contrast. CONTRAST:  141mL OMNIPAQUE IOHEXOL 300 MG/ML  SOLN COMPARISON:  10/11/2013 FINDINGS: Lower chest: Dense mitral calcification. Pacer leads noted. Right coronary artery atherosclerotic calcification. Descending thoracic aortic atherosclerosis. Hepatobiliary: Striking wall thickening in the gallbladder with single wall mural thickness up to 1.8 cm. There is a 3.4 cm in long axis gallstone proximally in the gallbladder. The mild stranding in the pericholecystic tissues. No significant  biliary dilatation. No significant focal liver lesion identified. Pancreas: Unremarkable Spleen: Unremarkable Adrenals/Urinary Tract: Adrenal glands normal. Mild right hydronephrosis without hydroureter and without stone at the UPJ, query chronic mild UPJ narrowing. There is no significant asymmetry in contrast enhancement the kidneys.  0.7 cm hypodense lesion of the right kidney upper pole is likely a small cyst but technically too small to characterize. Stomach/Bowel: Sigmoid colon diverticulosis without active diverticulitis. No dilated bowel. Normal appendix. Vascular/Lymphatic: Aortoiliac atherosclerotic vascular disease. Reproductive: Uterus absent.  Adnexa unremarkable. Other: No supplemental non-categorized findings. 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 There is also grade 1 degenerative anterolisthesis at L4-5. Lumbar spondylosis and degenerative disc disease leading to multilevel foraminal impingement most notably on the left at L5-S1. IMPRESSION: 1. Striking wall thickening in the gallbladder with a 3.4 cm in long axis gallstone proximally in the gallbladder. Mild stranding in the pericholecystic tissues. Appearance favors acute cholecystitis, correlate with clinical presentation. No CBD dilatation. 2. Other imaging findings of potential clinical significance: Coronary atherosclerosis. Dense mitral calcification. Sigmoid colon diverticulosis. 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 Lumbar spondylosis and degenerative disc disease leading to multilevel foraminal impingement most notably on the left at L5-S1. 3. Aortic atherosclerosis. Aortic Atherosclerosis (ICD10-I70.0). Electronically Signed   By: Van Clines M.D.   On: 09/05/2020 12:30    Procedures Procedures   Medications Ordered in ED Medications  potassium chloride 10 mEq in 100 mL IVPB (10 mEq Intravenous New Bag/Given 09/05/20 1231)  potassium  chloride SA (KLOR-CON) CR tablet 40 mEq (has no administration in time range)  magnesium sulfate IVPB 2 g 50 mL (has no administration in time range)  piperacillin-tazobactam (ZOSYN) IVPB 3.375 g (has no administration in time range)  ondansetron (ZOFRAN) injection 4 mg (4 mg Intravenous Given 09/05/20 1153)  morphine 4 MG/ML injection 4 mg (4 mg Intravenous Given 09/05/20 1153)  sodium chloride 0.9 % bolus 500 mL (500 mLs Intravenous New Bag/Given 09/05/20 1152)  iohexol (OMNIPAQUE) 300 MG/ML solution 100 mL (100 mLs Intravenous Contrast Given 09/05/20 1213)  piperacillin-tazobactam (ZOSYN) IVPB 3.375 g (3.375 g Intravenous New Bag/Given 09/05/20 1240)  HYDROmorphone (DILAUDID) injection 0.5 mg (0.5 mg Intravenous Given 09/05/20 1254)    ED Course  I have reviewed the triage vital signs and the nursing notes.  Pertinent labs & imaging results that were available during my care of the patient were reviewed by me and considered in my medical decision making (see chart for details).  80 year old presents for evaluation of lower abdominal pain.  Began 5 days ago.  She is afebrile, nonseptic, non-ill-appearing.  Subjective fever at home, nausea as well as decreased appetite.  No known Covid exposures.  Her heart and lungs are clear.  Her abdomen has some mild tenderness to lower quadrants however no focal pain.  She has no urinary complaints.  Chronically anticoagulated on Eliquis.  No chest pain or shortness of breath.  We will plan on labs, imaging and reassess.  Labs and imaging personally reviewed and interpreted:  CBC leukocytosis at 14.7 CMP sodium 130, potassium 2.8, T bili 1.9, no additional elevation UA small leuks, few bacteria CT AP with wall thickness in the gallbladder measuring up to 1.8 cm, 3.4 cm long gallstone in gallbladder, stranding and pericholecystic tissues.  No biliary dilation  EKG without ischemia  Patient reassessed. Pain down from 5 to 4, will give additional pain meds.  Discussed imaging and labs in room with plan to consult with general surgery with patient and now Nephew in room. Patient agreeable.  Patient without any tachycardia, tachypnea, and is afebrile here in ED.  Low suspicion for sepsis at this time.  She will be started on Zosyn for likely  acute cholecystitis, Was started on IVF on arrival to ED. Due to hypokalemia with prolonged QT interval on EKG started on IV replacement Potassium as well as Mag.   CONSULT with Dr. Marcello Moores with general surgery who recommends Medicine to admit, IV Abx, surgery will follow. Does not need ED to ED transfer 2/2 current anticoagulation on board.  CONSULT with Dr. Roosevelt Locks with [facility] who agrees to accept patient in transfer for admission.  The patient appears reasonably stabilized for admission considering the current resources, flow, and capabilities available in the ED at this time, and I doubt any other [facility] requiring further screening and/or treatment in the ED prior to admission.  Patient seen and evaluated by attending Dr. Kathrynn Humble who is in agreement with above treatment, plan and disposition.    MDM Rules/Calculators/A&P                           Final Clinical Impression(s) / ED Diagnoses Final diagnoses:  Cholecystitis  Hypokalemia  Hyponatremia  Elevated bilirubin    Rx / DC Orders ED Discharge Orders    None       Mack[place]ee Becvar A, PA-C 09/05/20 [place], Ankit, MD 09/06/20 [phone]

## 2020-09-05 NOTE — ED Triage Notes (Signed)
Pt endorses rx for doxycycline, unable to have filled yet

## 2020-09-06 ENCOUNTER — Encounter (HOSPITAL_COMMUNITY): Payer: Self-pay | Admitting: Internal Medicine

## 2020-09-06 DIAGNOSIS — I4891 Unspecified atrial fibrillation: Secondary | ICD-10-CM

## 2020-09-06 DIAGNOSIS — Z950 Presence of cardiac pacemaker: Secondary | ICD-10-CM | POA: Diagnosis not present

## 2020-09-06 DIAGNOSIS — K819 Cholecystitis, unspecified: Secondary | ICD-10-CM | POA: Diagnosis not present

## 2020-09-06 DIAGNOSIS — Z01810 Encounter for preprocedural cardiovascular examination: Secondary | ICD-10-CM

## 2020-09-06 DIAGNOSIS — I422 Other hypertrophic cardiomyopathy: Secondary | ICD-10-CM | POA: Diagnosis not present

## 2020-09-06 DIAGNOSIS — I340 Nonrheumatic mitral (valve) insufficiency: Secondary | ICD-10-CM

## 2020-09-06 LAB — COMPREHENSIVE METABOLIC PANEL
ALT: 18 U/L (ref 0–44)
AST: 33 U/L (ref 15–41)
Albumin: 2.7 g/dL — ABNORMAL LOW (ref 3.5–5.0)
Alkaline Phosphatase: 88 U/L (ref 38–126)
Anion gap: 9 (ref 5–15)
BUN: 15 mg/dL (ref 8–23)
CO2: 21 mmol/L — ABNORMAL LOW (ref 22–32)
Calcium: 8.4 mg/dL — ABNORMAL LOW (ref 8.9–10.3)
Chloride: 100 mmol/L (ref 98–111)
Creatinine, Ser: 0.87 mg/dL (ref 0.44–1.00)
GFR, Estimated: >60 mL/min (ref >60)
Glucose, Bld: 92 mg/dL (ref 70–99)
Potassium: 3.9 mmol/L (ref 3.5–5.1)
Sodium: 130 mmol/L — ABNORMAL LOW (ref 135–145)
Total Bilirubin: 1.2 mg/dL (ref 0.3–1.2)
Total Protein: 5.6 g/dL — ABNORMAL LOW (ref 6.5–8.1)

## 2020-09-06 LAB — TSH: TSH: 12.372 u[IU]/mL — ABNORMAL HIGH (ref 0.350–4.500)

## 2020-09-06 LAB — CBC
HCT: 32.9 % — ABNORMAL LOW (ref 36.0–46.0)
Hemoglobin: 11.1 g/dL — ABNORMAL LOW (ref 12.0–15.0)
MCH: 32.3 pg (ref 26.0–34.0)
MCHC: 33.7 g/dL (ref 30.0–36.0)
MCV: 95.6 fL (ref 80.0–100.0)
Platelets: 299 10*3/uL (ref 150–400)
RBC: 3.44 MIL/uL — ABNORMAL LOW (ref 3.87–5.11)
RDW: 12.9 % (ref 11.5–15.5)
WBC: 10.2 10*3/uL (ref 4.0–10.5)
nRBC: 0 % (ref 0.0–0.2)

## 2020-09-06 LAB — URINE CULTURE: Culture: <10000 — AB

## 2020-09-06 LAB — GLUCOSE, CAPILLARY: Glucose-Capillary: 103 mg/dL — ABNORMAL HIGH (ref 70–99)

## 2020-09-06 LAB — PHOSPHORUS: Phosphorus: 2.8 mg/dL (ref 2.5–4.6)

## 2020-09-06 LAB — MAGNESIUM: Magnesium: 2.1 mg/dL (ref 1.7–2.4)

## 2020-09-06 MED ORDER — LIP MEDEX EX OINT
TOPICAL_OINTMENT | CUTANEOUS | Status: AC
  Filled 2020-09-06: qty 7

## 2020-09-06 MED ORDER — PROCHLORPERAZINE EDISYLATE 10 MG/2ML IJ SOLN
10.0000 mg | Freq: Four times a day (QID) | INTRAMUSCULAR | Status: DC | PRN
  Administered 2020-09-06: 10 mg via INTRAVENOUS
  Filled 2020-09-06: qty 2

## 2020-09-06 NOTE — Consult Note (Signed)
Cardiology Consultation:   Patient ID: Tracey Mcclure MRN: [id number]; DOB: 1941/04/30  Admit date: 09/05/2020 Date of Consult: 09/06/2020  Primary Care Provider: Crist Infante, MD [facility] Cardiologist: Peter Martinique, MD  [facility] Electrophysiologist: Cristopher Peru, MD   Patient Profile:   Tracey Mcclure is a 80 y.o. female with a hx of hypertrophic cardiomyopathy, longstanding persistent atrial fibrillation with slow ventricular response with a pacemaker in place, sending aorta, hypercholesterolemia, hypothyroidism, MGUS who is being seen today for the evaluation of preop CV evaluation for anticipated cholecystectomy for acute cholecystitis at the request of Dr. Pietro Cassis.  History of Present Illness:   Tracey Mcclure began experiencing epigastric pain roughly 5 days prior to admission, developed nausea and vomiting for the last 2 days, became febrile yesterday and was admitted for acute cholecystitis, apparently with a single large obstructing gallstone.  Prior to the onset of cholecystitis she had good functional status.  She lives independently and takes care of most of her own household chores, is quite active.  She denies angina or dyspnea with usual activity, can easily climb a flight of stairs without stopping.  She has not had recent problems with palpitations, dizziness, syncope, leg edema.  Her atrial fibrillation has never been particularly symptomatic and has been present at least since 2015.  Despite discontinuation of AV blocking she had significant bradycardia with long pauses and underwent pacemaker implantation in 2017 (Dr. Lovena Le, [employer] Advisa dual-chamber; right ventricular lead revision for fracture in 2019).    Her last pacemaker check showed normal findings on September 02, 2020, with very high frequency of ventricular pacing at 97%.  No episodes of rapid ventricular response.  She does not appear to be pacemaker dependent.    She has echo [place] Mikes most recently  performed October 2021, unchanged from 2017) findings suggestive of hypertrophic cardiomyopathy but does not have LV outflow obstruction and does not have a history of syncope.  She has moderate mitral insufficiency.  The left atrium is severely dilated.  Beta-blockers have been discontinued due to fatigue and hypotension.  She is on chronic anticoagulation with Eliquis without bleeding issues.  She does not have a history of stroke or TIA.   Past Medical History:  Diagnosis Date  . Atrial fibrillation ([place]) 12/05/2013  . Colon adenoma   . Gastric mass 10/14/2013  . GERD (gastroesophageal reflux disease)   . HTN (hypertension)    CONTROLLED  . Hypercholesteremia   . Hypertr obst cardiomyop   . Hypothyroidism   . Iron deficiency anemia, unspecified 10/14/2013  . 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  . Mitral insufficiency   . 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  . Obesity   . Palpitations     Past Surgical History:  Procedure Laterality Date  . ABDOMINAL HYSTERECTOMY    . EP IMPLANTABLE DEVICE N/A 10/26/2015   Procedure: Pacemaker Implant;  Surgeon: Evans Lance, MD;  Location: [place] [facility];  Service: Cardiovascular;  Laterality: N/A;  . OVARY SURGERY     BENIGN TUMOR  . PACEMAKER REVISION N/A 09/01/2017   Procedure: PACEMAKER REVISION;  Surgeon: Constance Haw, MD;  Location: [place] [facility];  Service: Cardiovascular;  Laterality: N/A;     Home Medications:  Prior to Admission medications   Medication Sig Start Date End Date Taking? Authorizing Provider  atorvastatin (LIPITOR) 40 MG tablet TAKE 1 TABLET BY MOUTH EVERYDAY AT BEDTIME 08/12/20  Yes Martinique, Peter M, MD  Cholecalciferol (VITAMIN D)  2000 units CAPS Take 2,000 Units by mouth daily.    Yes [provider]  cyanocobalamin 1000 MCG tablet Take 1,000 mcg by mouth daily.   Yes [provider]  ELIQUIS 5 MG TABS tablet TAKE 1 TABLET BY MOUTH TWICE  A DAY 07/24/20  Yes Martinique, Peter M, MD  hydrochlorothiazide (HYDRODIURIL) 25 MG tablet Take 0.5 tablets (12.5 mg total) by mouth daily. 06/06/16  Yes Martinique, Peter M, MD  levothyroxine (SYNTHROID, LEVOTHROID) 125 MCG tablet Take 125 mcg by mouth daily. Every day except Sundays.   Yes [provider]  Probiotic Product (PROBIOTIC DAILY PO) Take 1 tablet by mouth 2 (two) times daily.   Yes [provider]  acetaminophen (TYLENOL) 325 MG tablet Take 650 mg by mouth every 6 (six) hours as needed for mild pain or headache.     [provider]  hydrocortisone cream 0.5 % Apply 1 application topically daily as needed for itching.    [provider]    Inpatient Medications: Scheduled Meds: . acidophilus  1 capsule Oral Daily  . atorvastatin  40 mg Oral Daily  . cholecalciferol  2,000 Units Oral Daily  . enoxaparin (LOVENOX) injection  1 mg/kg Subcutaneous Q12H  . levothyroxine  125 mcg Oral Q0600  . magnesium oxide  400 mg Oral BID  . vitamin B-12  1,000 mcg Oral Daily   Continuous Infusions: . sodium chloride 100 mL/hr at 09/05/20 1718  . piperacillin-tazobactam (ZOSYN)  IV 3.375 g (09/06/20 0400)   PRN Meds: acetaminophen **OR** acetaminophen, acetaminophen, HYDROmorphone (DILAUDID) injection, ketorolac, ondansetron **OR** ondansetron (ZOFRAN) IV, prochlorperazine  Allergies:    Allergies  Allergen Reactions  . Azithromycin Other (See Comments)    Patient states that she had severe stomach cramps for a solid 4 hours.     Social History:   Social History   Socioeconomic History  . Marital status: Widowed    Spouse name: Not on file  . Number of children: 1  . Years of education: Not on file  . Highest education level: Not on file  Occupational History  . Occupation: Art gallery manager    Comment: retired  Tobacco Use  . Smoking status: Never Smoker  . Smokeless tobacco: Never Used  Vaping Use  . Vaping Use: Never used  Substance and Sexual  Activity  . Alcohol use: No  . Drug use: No  . Sexual activity: Never  Other Topics Concern  . Not on file  Social History Narrative  . Not on file   Social Determinants of Health   Financial Resource Strain: Not on file  Food Insecurity: Not on file  Transportation Needs: Not on file  Physical Activity: Not on file  Stress: Not on file  Social Connections: Not on file  Intimate Partner Violence: Not on file    Family History:    Family History  Problem Relation Age of Onset  . Heart attack Brother 30  . Prostate cancer Brother   . Breast cancer Sister 72     ROS:  Please see the history of present illness.  All other ROS reviewed and negative.     Physical Exam/Data:   Vitals:   09/05/20 2058 09/06/20 0143 09/06/20 0445 09/06/20 0947  BP: (!) 118/54 (!) 113/57 130/65 132/83  Pulse:  60 61 60  Resp: 16 18 18 16   Temp: 98.6 F (37 C) (!) 97.5 F (36.4 C) 98 F (36.7 C) (!) 97.3 F (36.3 C)  TempSrc: Oral Oral Oral  Oral  SpO2: 98% 98% 96% 98%  Weight:      Height:        Intake/Output Summary (Last 24 hours) at 09/06/2020 1009 Last data filed at 09/06/2020 0700 Gross per 24 hour  Intake 2851.76 ml  Output 700 ml  Net 2151.76 ml   Last 3 Weights 09/05/2020 05/06/2020 10/29/2019  Weight (lbs) 173 lb 11.2 oz 178 lb 3.2 oz 181 lb 12.8 oz  Weight (kg) 78.79 kg 80.831 kg 82.464 kg     Body mass index is 29.82 kg/m.  General:  Well nourished, well developed, looks very uncomfortable, but in no acute distress HEENT: normal Lymph: no adenopathy Neck: no JVD Endocrine:  No thryomegaly Vascular: No carotid bruits; FA pulses 2+ bilaterally without bruits  Cardiac:  normal S1, paradoxically split S2; RRR; 2/6 holosystolic murmur at the apex; no diastolic murmur; healthy left subclavian pacemaker site Lungs:  clear to auscultation bilaterally, no wheezing, rhonchi or rales  Abd: soft, nontender, no hepatomegaly  Ext: no edema Musculoskeletal:  No deformities, BUE  and BLE strength normal and equal Skin: warm and dry  Neuro:  CNs 2-12 intact, no focal abnormalities noted Psych:  Normal affect   EKG:  The EKG was personally reviewed and demonstrates: Atrial fibrillation with ventricular paced rhythm Telemetry:  Telemetry was personally reviewed and demonstrates: Atrial fibrillation with mostly ventricular paced rhythm  Relevant CV Studies: Echocardiogram 05/18/2020  1. Left ventricular ejection fraction, by estimation, is 65 to 70%. The  left ventricle has normal function. The left ventricle has no regional  wall motion abnormalities. There is severe asymmetric left ventricular  hypertrophy of the basal-septal  segment. No significant LVOT gradient at rest. Left ventricular diastolic  parameters are indeterminate.  2. Right ventricular systolic function is normal. The right ventricular  size is normal. There is mildly elevated pulmonary artery systolic  pressure. The estimated right ventricular systolic pressure is [id number] mmHg.  3. Left atrial size was severely dilated.  4. The mitral valve is abnormal. Moderate mitral valve regurgitation. No  evidence of mitral stenosis. Severe mitral annular calcification.  5. Tricuspid valve regurgitation is moderate.  6. The aortic valve is tricuspid. Aortic valve regurgitation is trivial.  No aortic stenosis is present.  7. Aortic dilatation noted. There is dilatation of the ascending aorta,  measuring 43 mm.  8. The inferior vena cava is normal in size with greater than 50%  respiratory variability, suggesting right atrial pressure of 3 mmHg.   Pacemaker check 09/05/2020 8:38 PM EST      Remote device check reviewed. Histograms appropriate. Leads and battery stable for patient. Follow up as outlined above. No recommended changes.      Laboratory Data:  High Sensitivity Troponin:  No results for input(s): TROPONINIHS in the last 720 hours.   Chemistry Recent Labs  Lab 09/05/20 1138  09/06/20 0550  NA 130* 130*  K 2.8* 3.9  CL 95* 100  CO2 24 21*  GLUCOSE 104* 92  BUN 16 15  CREATININE 0.96 0.87  CALCIUM 8.8* 8.4*  GFRNONAA >60 >60  ANIONGAP 11 9    Recent Labs  Lab 09/05/20 1138 09/06/20 0550  PROT 6.8 5.6*  ALBUMIN 3.3* 2.7*  AST 33 33  ALT 18 18  ALKPHOS 95 88  BILITOT 1.9* 1.2   Hematology Recent Labs  Lab 09/05/20 1138 09/06/20 0550  WBC 14.7* 10.2  RBC 4.14 3.44*  HGB 13.4 11.1*  HCT 38.4 32.9*  MCV 92.8 95.6  MCH  32.4 32.3  MCHC 34.9 33.7  RDW 12.6 12.9  PLT 326 299   BNPNo results for input(s): BNP, PROBNP in the last 168 hours.  DDimer No results for input(s): DDIMER in the last 168 hours.   Radiology/Studies:  CT Abdomen Pelvis W Contrast  Result Date: 09/05/2020 CLINICAL DATA:  Right lower quadrant abdominal pain over the last 5 days with diarrhea, nausea, vomiting, and fever. EXAM: CT ABDOMEN AND PELVIS WITH CONTRAST TECHNIQUE: Multidetector CT imaging of the abdomen and pelvis was performed using the standard protocol following bolus administration of intravenous contrast. CONTRAST:  168mL OMNIPAQUE IOHEXOL 300 MG/ML  SOLN COMPARISON:  10/11/2013 FINDINGS: Lower chest: Dense mitral calcification. Pacer leads noted. Right coronary artery atherosclerotic calcification. Descending thoracic aortic atherosclerosis. Hepatobiliary: Striking wall thickening in the gallbladder with single wall mural thickness up to 1.8 cm. There is a 3.4 cm in long axis gallstone proximally in the gallbladder. The mild stranding in the pericholecystic tissues. No significant biliary dilatation. No significant focal liver lesion identified. Pancreas: Unremarkable Spleen: Unremarkable Adrenals/Urinary Tract: Adrenal glands normal. Mild right hydronephrosis without hydroureter and without stone at the UPJ, query chronic mild UPJ narrowing. There is no significant asymmetry in contrast enhancement the kidneys. 0.7 cm hypodense lesion of the right kidney upper pole is  likely a small cyst but technically too small to characterize. Stomach/Bowel: Sigmoid colon diverticulosis without active diverticulitis. No dilated bowel. Normal appendix. Vascular/Lymphatic: Aortoiliac atherosclerotic vascular disease. Reproductive: Uterus absent.  Adnexa unremarkable. Other: No supplemental non-categorized findings. 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 There is also grade 1 degenerative anterolisthesis at L4-5. Lumbar spondylosis and degenerative disc disease leading to multilevel foraminal impingement most notably on the left at L5-S1. IMPRESSION: 1. Striking wall thickening in the gallbladder with a 3.4 cm in long axis gallstone proximally in the gallbladder. Mild stranding in the pericholecystic tissues. Appearance favors acute cholecystitis, correlate with clinical presentation. No CBD dilatation. 2. Other imaging findings of potential clinical significance: Coronary atherosclerosis. Dense mitral calcification. Sigmoid colon diverticulosis. 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 Lumbar spondylosis and degenerative disc disease leading to multilevel foraminal impingement most notably on the left at L5-S1. 3. Aortic atherosclerosis. Aortic Atherosclerosis (ICD10-I70.0). Electronically Signed   By: Van Clines M.D.   On: 09/05/2020 12:30     Assessment and Plan:   1. AFib: Spontaneously slow rate, 97% ventricular paced.  Not requiring rate control medications.  Permanent arrhythmia with a severely dilated left atrium.  Chronically on anticoagulation, without history of embolic events but at moderate embolic risk.  Short-term interruption of anticoagulation for cholecystostomy/cholecystectomy should be a low risk proposition.  Limit interruption of the anticoagulation for shortest safe period of time. 2. Pacemaker: Very recent pacemaker check with normal findings.  She is not pacemaker  dependent, but has almost 100% ventricular pacing due to slow ventricular response.  Infradiaphragmatic procedures such as cholecystectomy/cholecystostomy should not interfere with normal device function. 3. Hypertrophic cardiomyopathy: She does not have LV outflow tract obstruction and has not had clinical heart failure.  This condition has not been symptomatic even though she has been taken off her beta-blockers and was taking a week diuretic.  However, would recommend to avoid and promptly treat hypovolemia from bleeding/vomiting/diarrhea.  "Underfilling" of the left ventricle could lead to hypotension.  She has not been taking her thiazide diuretic for several days, due to the nausea and vomiting. 4. Moderate mitral insufficiency: Has not  been clinically/hemodynamically important to date. 5. Perioperative CV risk evaluation: Low to moderate increase in surgical risk of CV complications (primarily due to the relatively urgent nature of the procedure and patient's age).  I think this holds true whether she undergoes cholecystostomy or cholecystectomy.  She has good functional status and all her cardiovascular conditions are well compensated.  No plan for additional investigation or optimization of treatment before surgical procedure.   Risk Assessment/Risk Scores:          CHA2DS2-VASc Score = 4  This indicates a 4.8% annual risk of stroke. The patient's score is based upon: CHF History: No HTN History: Yes Diabetes History: No Stroke History: No Vascular Disease History: No Age Score: 2 Gender Score: 1        For questions or updates, please contact [place] Please consult [email or web address] for contact info under    Signed, Sanda Klein, MD  09/06/2020 10:09 AM

## 2020-09-06 NOTE — Evaluation (Signed)
Physical Therapy Evaluation Patient Details Name: Tracey Mcclure MRN: [id number] DOB: Jul 07, 1941 Today's Date: 09/06/2020   History of Present Illness  80 yo female admitted with cholecysitits. Hx of AV block, Afib, pacemaker  Clinical Impression  On eval, pt was Min guard assist for mobility. She walked ~40 feet around the room. Unsteady at times. She held on to furniture to steady herself intermittently. Pt reported experiencing some nausea prior to PTs arrival. She stated she is set to have a procedure on Monday. Will plan to follow pt and progress activity as tolerated. Pt stated she doesn't anticipate she will have any PT needs at discharge. Will update recs as necessary.     Follow Up Recommendations Home health PT;No PT follow up (depending on progress/medical course)    Equipment Recommendations  None recommended by PT    Recommendations for Other Services       Precautions / Restrictions Precautions Precautions: Fall Restrictions Weight Bearing Restrictions: No      Mobility  Bed Mobility               General bed mobility comments: sitting EOB    Transfers Overall transfer level: Needs assistance   Transfers: Sit to/from Stand Sit to Stand: Min guard         General transfer comment: Min guard for safety  Ambulation/Gait Ambulation/Gait assistance: Min guard ("furniture walk") Gait Distance (Feet): 40 Feet Assistive device:  ("furniture walk) Gait Pattern/deviations: Step-through pattern;Decreased stride length     General Gait Details: Min guard for safety. Mildly unsteady.  Stairs            Wheelchair Mobility    Modified Rankin (Stroke Patients Only)       Balance Overall balance assessment: Mild deficits observed, not formally tested                                           Pertinent Vitals/Pain Pain Assessment: Faces Faces Pain Scale: Hurts little more Pain Intervention(s): Monitored during session     Home Living Family/patient expects to be discharged to:: Private residence Living Arrangements: Alone Available Help at Discharge: Family;Available PRN/intermittently Type of Home: House Home Access: Stairs to enter     Home Layout: Multi-level Home Equipment: None      Prior Function Level of Independence: Independent               Hand Dominance        Extremity/Trunk Assessment   Upper Extremity Assessment Upper Extremity Assessment: Overall WFL for tasks assessed    Lower Extremity Assessment Lower Extremity Assessment: Generalized weakness    Cervical / Trunk Assessment Cervical / Trunk Assessment: Normal  Communication   Communication: No difficulties  Cognition Arousal/Alertness: Awake/alert Behavior During Therapy: WFL for tasks assessed/performed Overall Cognitive Status: Within Functional Limits for tasks assessed                                        General Comments      Exercises     Assessment/Plan    PT Assessment Patient needs continued PT services  PT Problem List Decreased mobility;Decreased balance       PT Treatment Interventions DME instruction;Gait training;Therapeutic activities;Therapeutic exercise;Patient/family education;Balance training;Functional mobility training    PT Goals (Current goals can  be found in the Care Plan section)  Acute Rehab PT Goals Patient Stated Goal: none stated PT Goal Formulation: With patient/family Time For Goal Achievement: 09/20/20 Potential to Achieve Goals: Good    Frequency Min 3X/week   Barriers to discharge        Co-evaluation               AM-PAC PT "6 Clicks" Mobility  Outcome Measure Help needed turning from your back to your side while in a flat bed without using bedrails?: None Help needed moving from lying on your back to sitting on the side of a flat bed without using bedrails?: None Help needed moving to and from a bed to a chair (including a  wheelchair)?: A Little Help needed standing up from a chair using your arms (e.g., wheelchair or bedside chair)?: A Little Help needed to walk in hospital room?: A Little Help needed climbing 3-5 steps with a railing? : A Little 6 Click Score: 20    End of Session   Activity Tolerance: Patient tolerated treatment well Patient left: in bed;with call bell/phone within reach;with family/visitor present   PT Visit Diagnosis: Unsteadiness on feet (R26.81)    Time: 1050-1100 PT Time Calculation (min) (ACUTE ONLY): 10 min   Charges:   PT Evaluation $PT Eval Low Complexity: 1 Low             Doreatha Massed, PT Acute Rehabilitation  Office: [phone] Pager: [phone]

## 2020-09-06 NOTE — Progress Notes (Signed)
Acute cholecystitis  Subjective: Having continued nausea.    Objective: Vital signs in last 24 hours: Temp:  [97.4 F (36.3 C)-98.6 F (37 C)] 98 F (36.7 C) (01/30 0445) Pulse Rate:  [57-64] 61 (01/30 0445) Resp:  [15-22] 18 (01/30 0445) BP: (113-163)/(54-76) 130/65 (01/30 0445) SpO2:  [96 %-100 %] 96 % (01/30 0445) Weight:  [78.8 kg] 78.8 kg (01/29 1122) Last BM Date: 08/31/20  Intake/Output from previous day: 01/29 0701 - 01/30 0700 In: 2851.8 [P.O.:960; I.V.:1000; IV Piggyback:891.8] Out: 700 [Urine:700] Intake/Output this shift: No intake/output data recorded.  General appearance: alert and cooperative GI: normal findings: TPP RUQ  Lab Results:  Results for orders placed or performed during the hospital encounter of 09/05/20 (from the past 24 hour(s))  CBC with Differential     Status: Abnormal   Collection Time: 09/05/20 11:38 AM  Result Value Ref Range   WBC 14.7 (H) 4.0 - 10.5 K/uL   RBC 4.14 3.87 - 5.11 MIL/uL   Hemoglobin 13.4 12.0 - 15.0 g/dL   HCT 38.4 36.0 - 46.0 %   MCV 92.8 80.0 - 100.0 fL   MCH 32.4 26.0 - 34.0 pg   MCHC 34.9 30.0 - 36.0 g/dL   RDW 12.6 11.5 - 15.5 %   Platelets 326 150 - 400 K/uL   nRBC 0.0 0.0 - 0.2 %   Neutrophils Relative % 85 %   Neutro Abs 12.5 (H) 1.7 - 7.7 K/uL   Lymphocytes Relative 8 %   Lymphs Abs 1.1 0.7 - 4.0 K/uL   Monocytes Relative 6 %   Monocytes Absolute 0.9 0.1 - 1.0 K/uL   Eosinophils Relative 0 %   Eosinophils Absolute 0.1 0.0 - 0.5 K/uL   Basophils Relative 0 %   Basophils Absolute 0.0 0.0 - 0.1 K/uL   Immature Granulocytes 1 %   Abs Immature Granulocytes 0.08 (H) 0.00 - 0.07 K/uL  Comprehensive metabolic panel     Status: Abnormal   Collection Time: 09/05/20 11:38 AM  Result Value Ref Range   Sodium 130 (L) 135 - 145 mmol/L   Potassium 2.8 (L) 3.5 - 5.1 mmol/L   Chloride 95 (L) 98 - 111 mmol/L   CO2 24 22 - 32 mmol/L   Glucose, Bld 104 (H) 70 - 99 mg/dL   BUN 16 8 - 23 mg/dL   Creatinine, Ser 0.96  0.44 - 1.00 mg/dL   Calcium 8.8 (L) 8.9 - 10.3 mg/dL   Total Protein 6.8 6.5 - 8.1 g/dL   Albumin 3.3 (L) 3.5 - 5.0 g/dL   AST 33 15 - 41 U/L   ALT 18 0 - 44 U/L   Alkaline Phosphatase 95 38 - 126 U/L   Total Bilirubin 1.9 (H) 0.3 - 1.2 mg/dL   GFR, Estimated >60 >60 mL/min   Anion gap 11 5 - 15  Urinalysis, Routine w reflex microscopic Urine, Clean Catch     Status: Abnormal   Collection Time: 09/05/20 11:38 AM  Result Value Ref Range   Color, Urine YELLOW YELLOW   APPearance HAZY (A) CLEAR   Specific Gravity, Urine 1.010 1.005 - 1.030   pH 6.0 5.0 - 8.0   Glucose, UA NEGATIVE NEGATIVE mg/dL   Hgb urine dipstick SMALL (A) NEGATIVE   Bilirubin Urine NEGATIVE NEGATIVE   Ketones, ur NEGATIVE NEGATIVE mg/dL   Protein, ur NEGATIVE NEGATIVE mg/dL   Nitrite NEGATIVE NEGATIVE   Leukocytes,Ua SMALL (A) NEGATIVE  Magnesium     Status: None   Collection  Time: 09/05/20 11:38 AM  Result Value Ref Range   Magnesium 1.7 1.7 - 2.4 mg/dL  Urinalysis, Microscopic (reflex)     Status: Abnormal   Collection Time: 09/05/20 11:38 AM  Result Value Ref Range   RBC / HPF 0-5 0 - 5 RBC/hpf   WBC, UA 11-20 0 - 5 WBC/hpf   Bacteria, UA FEW (A) NONE SEEN   Squamous Epithelial / LPF 6-10 0 - 5   WBC Casts, UA PRESENT   SARS Coronavirus 2 by RT PCR (hospital order, performed in [facility] hospital lab) Nasopharyngeal Nasopharyngeal Swab     Status: None   Collection Time: 09/05/20 12:35 PM   Specimen: Nasopharyngeal Swab  Result Value Ref Range   SARS Coronavirus 2 NEGATIVE NEGATIVE  CBC     Status: Abnormal   Collection Time: 09/06/20  5:50 AM  Result Value Ref Range   WBC 10.2 4.0 - 10.5 K/uL   RBC 3.44 (L) 3.87 - 5.11 MIL/uL   Hemoglobin 11.1 (L) 12.0 - 15.0 g/dL   HCT 32.9 (L) 36.0 - 46.0 %   MCV 95.6 80.0 - 100.0 fL   MCH 32.3 26.0 - 34.0 pg   MCHC 33.7 30.0 - 36.0 g/dL   RDW 12.9 11.5 - 15.5 %   Platelets 299 150 - 400 K/uL   nRBC 0.0 0.0 - 0.2 %  Comprehensive metabolic panel      Status: Abnormal   Collection Time: 09/06/20  5:50 AM  Result Value Ref Range   Sodium 130 (L) 135 - 145 mmol/L   Potassium 3.9 3.5 - 5.1 mmol/L   Chloride 100 98 - 111 mmol/L   CO2 21 (L) 22 - 32 mmol/L   Glucose, Bld 92 70 - 99 mg/dL   BUN 15 8 - 23 mg/dL   Creatinine, Ser 0.87 0.44 - 1.00 mg/dL   Calcium 8.4 (L) 8.9 - 10.3 mg/dL   Total Protein 5.6 (L) 6.5 - 8.1 g/dL   Albumin 2.7 (L) 3.5 - 5.0 g/dL   AST 33 15 - 41 U/L   ALT 18 0 - 44 U/L   Alkaline Phosphatase 88 38 - 126 U/L   Total Bilirubin 1.2 0.3 - 1.2 mg/dL   GFR, Estimated >60 >60 mL/min   Anion gap 9 5 - 15  Magnesium     Status: None   Collection Time: 09/06/20  5:50 AM  Result Value Ref Range   Magnesium 2.1 1.7 - 2.4 mg/dL  Glucose, capillary     Status: Abnormal   Collection Time: 09/06/20  7:51 AM  Result Value Ref Range   Glucose-Capillary 103 (H) 70 - 99 mg/dL     Studies/Results Radiology     MEDS, Scheduled . acidophilus  1 capsule Oral Daily  . atorvastatin  40 mg Oral Daily  . cholecalciferol  2,000 Units Oral Daily  . enoxaparin (LOVENOX) injection  1 mg/kg Subcutaneous Q12H  . levothyroxine  125 mcg Oral Q0600  . magnesium oxide  400 mg Oral BID  . 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 Given pt's duration of symptoms and cardiac symptoms, would recommend IR drain vs OR this admission.   LOS: 1 day    Rosario Adie, [facility], [place]    09/06/2020 7:59 AM

## 2020-09-06 NOTE — Progress Notes (Signed)
PROGRESS NOTE  Tracey Mcclure  DOB: 03/19/1941  PCP: Crist Infante, MD YQI:[id number]  DOA: 09/05/2020  LOS: 1 day   Chief Complaint  Patient presents with  . Abdominal Pain   Brief narrative: Tracey Mcclure is a 80 y.o. female with PMH significant for symmetrical hypertrophic cardiomyopathy, A. fib on Eliquis, SSS status post PPM, HLD, hypothyroidism, MGUS. Patient presented to the ED on 1/29  presented with nausea, right upper quadrant abdominal pain and diarrhea, progressively worsening for 5 days.   In the ED, CT scan of abdomen showed thickening of the gallbladder wall with a 3.4 cm long axis gallstone which favors acute cholecystitis. WBC Count elevated to 14.7, sodium low at 130, potassium low at 2.8 Patient was admitted to hospitalist service. General surgery consultation was obtained.  Subjective: Patient was seen and examined this morning.  Elderly Caucasian female.  Lying down in bed.  Currently feeling better after getting nausea medications.  Daughter Ms. Ann at bedside. Seen by general surgery earlier.  I called for cardiology consultation for periprocedural risk stratification. Chart reviewed Afebrile, hemodynamically stable, breathing room air Labs this morning with WBC count improved to 10.2.  Sodium remains at 130, potassium improved to 3.9  Assessment/Plan: Acute cholecystitis Large gallstone -Seen by general surgery and IR.  Noted a plan for cholecystostomy tomorrow. -Currently on IV Zosyn.  History of asymmetric hypertrophic cardiomyopathy No LV outflow obstruction, not on heart failure -Echo from October 2021 showed normal EF 65 to 70%, severe asymmetric left ventricular hypertrophy of the basal septal segment with no significant LVOT gradient at rest, moderate MR, TR.  Per cardiology note, this echo was essentially stable from 2017. -Cardiology consultation obtained.  Currently at acceptable risk for intended procedure  A. fib/A-flutter Sick sinus  syndrome status post PPM -Not on AV nodal blocking agent. -On Eliquis for anticoagulation.  Currently Eliquis is on hold, replaced with full dose Lovenox.  Essential hypertension -On HCTZ 12.5 mg daily at home.  Currently on hold and is getting IV fluid.  Hypokalemia -Potassium improved with replacement.  Check phosphorus level. Recent Labs  Lab 09/05/20 1138 09/06/20 0550  K 2.8* 3.9  MG 1.7 2.1  PHOS  --  2.8   MGUS -Stable  Ascending aortic dilatation -On Eliquis and Lipitor.  Hypothyroidism -Continue Synthroid.  Obtain TSH level Recent Labs    09/06/20 0837  TSH 12.372*     Mobility: PT eval appreciated home health PT recommended Code Status:   Code Status: Full Code  Nutritional status: Body mass index is 29.82 kg/m.     Diet Order            Diet NPO time specified  Diet effective midnight           Diet full liquid Room service appropriate? Yes; Fluid consistency: Thin  Diet effective now                 DVT prophylaxis: Currently Lovenox   Antimicrobials:  IV Zosyn Fluid: Normal saline at 100 mL/h Consultants: General surgery, IR, cardiology Family Communication:  Daughter at bedside  Status is: Inpatient   Remains inpatient appropriate because: Planned for cholecystostomy tomorrow.  Dispo: The patient is from: Home              Anticipated d/c is to: Home health PT              Anticipated d/c date is: 3 days  Patient currently is not medically stable to d/c.   Difficult to place patient No       Infusions:  . sodium chloride 100 mL/hr at 09/05/20 1718  . piperacillin-tazobactam (ZOSYN)  IV 3.375 g (09/06/20 1211)    Scheduled Meds: . acidophilus  1 capsule Oral Daily  . atorvastatin  40 mg Oral Daily  . cholecalciferol  2,000 Units Oral Daily  . enoxaparin (LOVENOX) injection  1 mg/kg Subcutaneous Q12H  . levothyroxine  125 mcg Oral Q0600  . lip balm      . magnesium oxide  400 mg Oral BID  . vitamin B-12   1,000 mcg Oral Daily    Antimicrobials: Anti-infectives (From admission, onward)   Start     Dose/Rate Route Frequency Ordered Stop   09/05/20 1900  piperacillin-tazobactam (ZOSYN) IVPB 3.375 g        3.375 g 12.5 mL/hr over 240 Minutes Intravenous Every 8 hours 09/05/20 1320     09/05/20 1245  piperacillin-tazobactam (ZOSYN) IVPB 3.375 g        3.375 g 100 mL/hr over 30 Minutes Intravenous  Once 09/05/20 1235 09/05/20 1326      PRN meds: acetaminophen **OR** acetaminophen, acetaminophen, HYDROmorphone (DILAUDID) injection, ketorolac, ondansetron **OR** ondansetron (ZOFRAN) IV, prochlorperazine   Objective: Vitals:   09/06/20 0445 09/06/20 0947  BP: 130/65 132/83  Pulse: 61 60  Resp: 18 16  Temp: 98 F (36.7 C) (!) 97.3 F (36.3 C)  SpO2: 96% 98%    Intake/Output Summary (Last 24 hours) at 09/06/2020 1243 Last data filed at 09/06/2020 1000 Gross per 24 hour  Intake 2891.76 ml  Output 700 ml  Net 2191.76 ml   Filed Weights   09/05/20 1122  Weight: 78.8 kg   Weight change:  Body mass index is 29.82 kg/m.   Physical Exam: General exam: Pleasant elderly Caucasian female.  Pain controlled at this time Skin: No rashes, lesions or ulcers. HEENT: Atraumatic, normocephalic, no obvious bleeding Lungs: Clear to auscultation bilaterally CVS: Regular rate and rhythm, low-grade murmur GI/Abd soft, mild tenderness on the right upper quadrant, bowel sound present CNS: Alert, awake, oriented x3 Psychiatry: Mood appropriate Extremities: No pedal edema, no calf tenderness  Data Review: I have personally reviewed the laboratory data and studies available.  Recent Labs  Lab 09/05/20 1138 09/06/20 0550  WBC 14.7* 10.2  NEUTROABS 12.5*  --   HGB 13.4 11.1*  HCT 38.4 32.9*  MCV 92.8 95.6  PLT 326 299   Recent Labs  Lab 09/05/20 1138 09/06/20 0550  NA 130* 130*  K 2.8* 3.9  CL 95* 100  CO2 24 21*  GLUCOSE 104* 92  BUN 16 15  CREATININE 0.96 0.87  CALCIUM 8.8* 8.4*   MG 1.7 2.1  PHOS  --  2.8    F/u labs ordered  Signed, Terrilee Croak, MD [facility] 09/06/2020

## 2020-09-06 NOTE — H&P (Signed)
Chief Complaint: Cholecystitis  Referring Physician(s): Leighton Ruff  Supervising Physician: Jacqulynn Cadet  Patient Status: [facility] - In[facility]pt  History of Present Illness: Tracey Mcclure is a 80 y.o. female with medical history significant for asymmetrical hypertrophic cardiomyopathy, A. fib on Eliquis, sick sinus syndrome now with pacemaker, HLD, hypothyroidism, MGUS.  She presented to the ED with 5 days of RUQ pain and nausea.  CT showed= Striking wall thickening in the gallbladder with a 3.4 cm in long axis gallstone proximally in the gallbladder. Mild stranding in the pericholecystic tissues. Appearance favors acute cholecystitis, correlate with clinical presentation. No CBD dilatation.          General surgery feels she is high risk for cholecystectomy at this time given her cardiac history and have asked IR to place a percutaneous cholecystostomy tube.   Past Medical History:  Diagnosis Date  . Atrial fibrillation ([place]) 12/05/2013  . Colon adenoma   . Gastric mass 10/14/2013  . GERD (gastroesophageal reflux disease)   . HTN (hypertension)    CONTROLLED  . Hypercholesteremia   . Hypertr obst cardiomyop   . Hypothyroidism   . Iron deficiency anemia, unspecified 10/14/2013  . 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  . Mitral insufficiency   . 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  . Obesity   . Palpitations     Past Surgical History:  Procedure Laterality Date  . ABDOMINAL HYSTERECTOMY    . EP IMPLANTABLE DEVICE N/A 10/26/2015   Procedure: Pacemaker Implant;  Surgeon: Evans Lance, MD;  Location: [facility];  Service: Cardiovascular;  Laterality: N/A;  . OVARY SURGERY     BENIGN TUMOR  . PACEMAKER REVISION N/A 09/01/2017   Procedure: PACEMAKER REVISION;  Surgeon: Constance Haw, MD;  Location: [facility];  Service: Cardiovascular;  Laterality: N/A;     Allergies: Azithromycin  Medications: Prior to Admission medications   Medication Sig Start Date End Date Taking? Authorizing Provider  atorvastatin (LIPITOR) 40 MG tablet TAKE 1 TABLET BY MOUTH EVERYDAY AT BEDTIME 08/12/20  Yes [place]ique, Peter M, MD  Cholecalciferol (VITAMIN D) 2000 units CAPS Take 2,000 Units by mouth daily.    Yes [provider]  cyanocobalamin 1000 MCG tablet Take 1,000 mcg by mouth daily.   Yes [provider]  ELIQUIS 5 MG TABS tablet TAKE 1 TABLET BY MOUTH TWICE A DAY 07/24/20  Yes [place], Peter M, MD  hydrochlorothiazide (HYDRODIURIL) 25 MG tablet Take 0.5 tablets (12.5 mg total) by mouth daily. 06/06/16  Yes [place], Peter M, MD  levothyroxine (SYNTHROID, LEVOTHROID) 125 MCG tablet Take 125 mcg by mouth daily. Every day except Sundays.   Yes [provider]  Probiotic Product (PROBIOTIC DAILY PO) Take 1 tablet by mouth 2 (two) times daily.   Yes [provider]  acetaminophen (TYLENOL) 325 MG tablet Take 650 mg by mouth every 6 (six) hours as needed for mild pain or headache.     [provider]  hydrocortisone cream 0.5 % Apply 1 application topically daily as needed for itching.    [provider]     Family History  Problem Relation Age of Onset  . Heart attack Brother 16  . Prostate cancer Brother   . Breast cancer Sister 62    Social History   Socioeconomic History  . Marital status: Widowed    Spouse name: Not on file  . Number of children: 1  . Years of education:  Not on file  . Highest education level: Not on file  Occupational History  . Occupation: Art gallery manager    Comment: retired  Tobacco Use  . Smoking status: Never Smoker  . Smokeless tobacco: Never Used  Vaping Use  . Vaping Use: Never used  Substance and Sexual Activity  . Alcohol use: No  . Drug use: No  . Sexual activity: Never  Other Topics Concern  . Not on file  Social History Narrative  . Not on file   Social  Determinants of Health   Financial Resource Strain: Not on file  Food Insecurity: Not on file  Transportation Needs: Not on file  Physical Activity: Not on file  Stress: Not on file  Social Connections: Not on file     Review of Systems: A 12 point ROS discussed and pertinent positives are indicated in the HPI above.  All other systems are negative.  Review of Systems  Vital Signs: BP 132/83 (BP Location: Right Arm)   Pulse 60   Temp (!) 97.3 F (36.3 C) (Oral)   Resp 16   Ht 5\' 4"  (1.626 m)   Wt 78.8 kg   LMP  (LMP Unknown)   SpO2 98%   BMI 29.82 kg/m   Physical Exam Vitals reviewed.  Constitutional:      General: She is not in acute distress.    Appearance: She is ill-appearing.  HENT:     Head: Normocephalic and atraumatic.  Eyes:     Extraocular Movements: Extraocular movements intact.  Cardiovascular:     Rate and Rhythm: Normal rate and regular rhythm.  Pulmonary:     Effort: Pulmonary effort is normal. No respiratory distress.     Breath sounds: Normal breath sounds.  Abdominal:     General: There is no distension.     Palpations: Abdomen is soft.     Tenderness: There is abdominal tenderness in the right upper quadrant.  Musculoskeletal:        General: Normal range of motion.  Skin:    General: Skin is warm and dry.  Neurological:     General: No focal deficit present.     Mental Status: She is alert and oriented to person, place, and time.  Psychiatric:        Mood and Affect: Mood normal.        Behavior: Behavior normal.        Thought Content: Thought content normal.        Judgment: Judgment normal.     Imaging: CT Abdomen Pelvis W Contrast  Result Date: 09/05/2020 CLINICAL DATA:  Right lower quadrant abdominal pain over the last 5 days with diarrhea, nausea, vomiting, and fever. EXAM: CT ABDOMEN AND PELVIS WITH CONTRAST TECHNIQUE: Multidetector CT imaging of the abdomen and pelvis was performed using the standard protocol following bolus  administration of intravenous contrast. CONTRAST:  159mL OMNIPAQUE IOHEXOL 300 MG/ML  SOLN COMPARISON:  10/11/2013 FINDINGS: Lower chest: Dense mitral calcification. Pacer leads noted. Right coronary artery atherosclerotic calcification. Descending thoracic aortic atherosclerosis. Hepatobiliary: Striking wall thickening in the gallbladder with single wall mural thickness up to 1.8 cm. There is a 3.4 cm in long axis gallstone proximally in the gallbladder. The mild stranding in the pericholecystic tissues. No significant biliary dilatation. No significant focal liver lesion identified. Pancreas: Unremarkable Spleen: Unremarkable Adrenals/Urinary Tract: Adrenal glands normal. Mild right hydronephrosis without hydroureter and without stone at the UPJ, query chronic mild UPJ narrowing. There is no significant asymmetry in contrast  enhancement the kidneys. 0.7 cm hypodense lesion of the right kidney upper pole is likely a small cyst but technically too small to characterize. Stomach/Bowel: Sigmoid colon diverticulosis without active diverticulitis. No dilated bowel. Normal appendix. Vascular/Lymphatic: Aortoiliac atherosclerotic vascular disease. Reproductive: Uterus absent.  Adnexa unremarkable. Other: No supplemental non-categorized findings. 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 There is also grade 1 degenerative anterolisthesis at L4-5. Lumbar spondylosis and degenerative disc disease leading to multilevel foraminal impingement most notably on the left at L5-S1. IMPRESSION: 1. Striking wall thickening in the gallbladder with a 3.4 cm in long axis gallstone proximally in the gallbladder. Mild stranding in the pericholecystic tissues. Appearance favors acute cholecystitis, correlate with clinical presentation. No CBD dilatation. 2. Other imaging findings of potential clinical significance: Coronary atherosclerosis. Dense mitral calcification. Sigmoid colon diverticulosis.  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 Lumbar spondylosis and degenerative disc disease leading to multilevel foraminal impingement most notably on the left at L5-S1. 3. Aortic atherosclerosis. Aortic Atherosclerosis (ICD10-I70.0). Electronically Signed   By: Van Clines M.D.   On: 09/05/2020 12:30   CUP PACEART REMOTE DEVICE CHECK  Result Date: 09/02/2020 Scheduled remote reviewed. Normal device function.  Next remote 91 days. HB   Labs:  CBC: Recent Labs    09/05/20 1138 09/06/20 0550  WBC 14.7* 10.2  HGB 13.4 11.1*  HCT 38.4 32.9*  PLT 326 299    COAGS: No results for input(s): INR, APTT in the last 8760 hours.  BMP: Recent Labs    05/19/20 1455 09/05/20 1138 09/06/20 0550  NA 137 130* 130*  K 3.9 2.8* 3.9  CL 99 95* 100  CO2 26 24 21*  GLUCOSE 70 104* 92  BUN 11 16 15   CALCIUM 9.3 8.8* 8.4*  CREATININE 1.02* 0.96 0.87  GFRNONAA 53* >60 >60  GFRAA 61  --   --     LIVER FUNCTION TESTS: Recent Labs    09/05/20 1138 09/06/20 0550  BILITOT 1.9* 1.2  AST 33 33  ALT 18 18  ALKPHOS 95 88  PROT 6.8 5.6*  ALBUMIN 3.3* 2.7*    TUMOR MARKERS: No results for input(s): AFPTM, CEA, CA199, CHROMGRNA in the last 8760 hours.  Assessment and Plan:  Acute cholecystitis in the setting of hypertrophic cardiomyopathy and Afib on Eliquis.  Last dose of Eliquis was yesterday morning around 9 am.  Will proceed with image guided percutaneous cholecystostomy tomorrow by Dr. Dwaine Gale.  Risks and benefits discussed with the patient including, but not limited to bleeding, infection, gallbladder perforation, bile leak, sepsis or even death.  All of the patient's questions were answered, patient is agreeable to proceed. Consent signed and in chart.  Thank you for this interesting consult.  I greatly enjoyed meeting Tracey Mcclure and look forward to participating in their care.  A copy of this report was sent to the requesting  provider on this date.  Electronically Signed: Murrell Redden, PA-C   09/06/2020, 11:29 AM      I spent a total of 40 Minutes in face to face in clinical consultation, greater than 50% of which was counseling/coordinating care for perc chole.

## 2020-09-07 ENCOUNTER — Inpatient Hospital Stay (HOSPITAL_COMMUNITY): Payer: Medicare Other

## 2020-09-07 DIAGNOSIS — K810 Acute cholecystitis: Secondary | ICD-10-CM

## 2020-09-07 HISTORY — PX: IR PERC CHOLECYSTOSTOMY: IMG2326

## 2020-09-07 LAB — CBC WITH DIFFERENTIAL/PLATELET
Abs Immature Granulocytes: 0.04 10*3/uL (ref 0.00–0.07)
Basophils Absolute: 0.1 10*3/uL (ref 0.0–0.1)
Basophils Relative: 1 %
Eosinophils Absolute: 0.1 10*3/uL (ref 0.0–0.5)
Eosinophils Relative: 1 %
HCT: 36.3 % (ref 36.0–46.0)
Hemoglobin: 12 g/dL (ref 12.0–15.0)
Immature Granulocytes: 0 %
Lymphocytes Relative: 18 %
Lymphs Abs: 1.7 10*3/uL (ref 0.7–4.0)
MCH: 31.7 pg (ref 26.0–34.0)
MCHC: 33.1 g/dL (ref 30.0–36.0)
MCV: 95.8 fL (ref 80.0–100.0)
Monocytes Absolute: 0.7 10*3/uL (ref 0.1–1.0)
Monocytes Relative: 8 %
Neutro Abs: 6.6 10*3/uL (ref 1.7–7.7)
Neutrophils Relative %: 72 %
Platelets: 333 10*3/uL (ref 150–400)
RBC: 3.79 MIL/uL — ABNORMAL LOW (ref 3.87–5.11)
RDW: 12.8 % (ref 11.5–15.5)
WBC: 9.2 10*3/uL (ref 4.0–10.5)
nRBC: 0 % (ref 0.0–0.2)

## 2020-09-07 LAB — BASIC METABOLIC PANEL
Anion gap: 9 (ref 5–15)
BUN: 11 mg/dL (ref 8–23)
CO2: 22 mmol/L (ref 22–32)
Calcium: 9 mg/dL (ref 8.9–10.3)
Chloride: 102 mmol/L (ref 98–111)
Creatinine, Ser: 0.9 mg/dL (ref 0.44–1.00)
GFR, Estimated: >60 mL/min (ref >60)
Glucose, Bld: 95 mg/dL (ref 70–99)
Potassium: 4 mmol/L (ref 3.5–5.1)
Sodium: 133 mmol/L — ABNORMAL LOW (ref 135–145)

## 2020-09-07 LAB — PHOSPHORUS: Phosphorus: 3.2 mg/dL (ref 2.5–4.6)

## 2020-09-07 LAB — MAGNESIUM: Magnesium: 2 mg/dL (ref 1.7–2.4)

## 2020-09-07 MED ORDER — FENTANYL CITRATE (PF) 100 MCG/2ML IJ SOLN
INTRAMUSCULAR | Status: AC
  Filled 2020-09-07: qty 2

## 2020-09-07 MED ORDER — PIPERACILLIN-TAZOBACTAM 3.375 G IVPB
INTRAVENOUS | Status: AC
  Administered 2020-09-07: 3.375 g via INTRAVENOUS
  Filled 2020-09-07: qty 50

## 2020-09-07 MED ORDER — FENTANYL CITRATE (PF) 100 MCG/2ML IJ SOLN
INTRAMUSCULAR | Status: AC | PRN
  Administered 2020-09-07: 50 ug via INTRAVENOUS
  Administered 2020-09-07: 25 ug via INTRAVENOUS

## 2020-09-07 MED ORDER — LIDOCAINE HCL 1 % IJ SOLN
INTRAMUSCULAR | Status: AC
  Filled 2020-09-07: qty 20

## 2020-09-07 MED ORDER — MIDAZOLAM HCL 2 MG/2ML IJ SOLN
INTRAMUSCULAR | Status: AC | PRN
Start: 2020-09-07 — End: 2020-09-07
  Administered 2020-09-07 (×2): 0.5 mg via INTRAVENOUS
  Administered 2020-09-07: 1 mg via INTRAVENOUS

## 2020-09-07 MED ORDER — ENOXAPARIN SODIUM 80 MG/0.8ML ~~LOC~~ SOLN
1.0000 mg/kg | Freq: Two times a day (BID) | SUBCUTANEOUS | Status: DC
  Administered 2020-09-07 – 2020-09-09 (×4): 80 mg via SUBCUTANEOUS
  Filled 2020-09-07 (×4): qty 0.8

## 2020-09-07 MED ORDER — MIDAZOLAM HCL 2 MG/2ML IJ SOLN
INTRAMUSCULAR | Status: AC
  Filled 2020-09-07: qty 4

## 2020-09-07 MED ORDER — IOHEXOL 300 MG/ML  SOLN
50.0000 mL | Freq: Once | INTRAMUSCULAR | Status: AC | PRN
  Administered 2020-09-07: 10:00:00 5 mL

## 2020-09-07 MED ORDER — LIDOCAINE HCL (PF) 1 % IJ SOLN
INTRAMUSCULAR | Status: AC | PRN
  Administered 2020-09-07: 10 mL via INTRADERMAL

## 2020-09-07 NOTE — Progress Notes (Signed)
MEDICATION-RELATED CONSULT NOTE   IR Procedure Consult - Anticoagulant/Antiplatelet PTA/Inpatient Med List Review by Pharmacist    Procedure: percutaneous cholecystostomy    Completed: 1/31 at 10:41am  Post-Procedural bleeding risk per IR MD assessment: Standard  Antithrombotic medications on inpatient or PTA profile prior to procedure:   Lovenox 80mg  SQ q12 (therapeutic 1mg /kg q12)    Recommended restart time per IR Post-Procedure Guidelines:  6 hr after procedure   Other considerations:      Plan:   Resume Lovenox 80mg  SQ q12 at 1800 on 1/31  Minda Ditto PharmD 09/07/2020, 10:51 AM

## 2020-09-07 NOTE — Progress Notes (Signed)
Subjective: Mostly with crampy abdominal pain today.  Hungry.  No nausea.  Headed down now for perc chole drain.  ROS: See above, otherwise other systems negative  Objective: Vital signs in last 24 hours: Temp:  [97.7 F (36.5 C)-98.3 F (36.8 C)] 98.3 F (36.8 C) (01/31 0558) Pulse Rate:  [59-60] 60 (01/31 1010) Resp:  [14-24] 15 (01/31 1010) BP: (116-146)/(59-67) 122/61 (01/31 1010) SpO2:  [97 %-100 %] 99 % (01/31 1010) Last BM Date: 08/31/20  Intake/Output from previous day: 01/30 0701 - 01/31 0700 In: 700 [P.O.:500; IV Piggyback:200] Out: 600 [Urine:600] Intake/Output this shift: No intake/output data recorded.  PE: Gen: NAD, laying in bed Abd: soft, tender in RUQ, ND, +BS  Lab Results:  Recent Labs    09/06/20 0550 09/07/20 0617  WBC 10.2 9.2  HGB 11.1* 12.0  HCT 32.9* 36.3  PLT 299 333   BMET Recent Labs    09/06/20 0550 09/07/20 0617  NA 130* 133*  K 3.9 4.0  CL 100 102  CO2 21* 22  GLUCOSE 92 95  BUN 15 11  CREATININE 0.87 0.90  CALCIUM 8.4* 9.0   PT/INR No results for input(s): LABPROT, INR in the last 72 hours. CMP     Component Value Date/Time   NA 133 (L) 09/07/2020 0617   NA 137 05/19/2020 1455   NA 137 02/10/2017 0954   K 4.0 09/07/2020 0617   K 3.9 02/10/2017 0954   CL 102 09/07/2020 0617   CO2 22 09/07/2020 0617   CO2 23 02/10/2017 0954   GLUCOSE 95 09/07/2020 0617   GLUCOSE 87 02/10/2017 0954   BUN 11 09/07/2020 0617   BUN 11 05/19/2020 1455   BUN 14.8 02/10/2017 0954   CREATININE 0.90 09/07/2020 0617   CREATININE 1.0 02/10/2017 0954   CALCIUM 9.0 09/07/2020 0617   CALCIUM 10.1 02/10/2017 0954   PROT 5.6 (L) 09/06/2020 0550   PROT 7.0 02/10/2017 0954   PROT 6.4 02/10/2017 0953   ALBUMIN 2.7 (L) 09/06/2020 0550   ALBUMIN 3.7 02/10/2017 0954   AST 33 09/06/2020 0550   AST 21 02/10/2017 0954   ALT 18 09/06/2020 0550   ALT 14 02/10/2017 0954   ALKPHOS 88 09/06/2020 0550   ALKPHOS 115 02/10/2017 0954   BILITOT  1.2 09/06/2020 0550   BILITOT 0.66 02/10/2017 0954   GFRNONAA >60 09/07/2020 0617   GFRAA 61 05/19/2020 1455   Lipase  No results found for: LIPASE     Studies/Results: CT Abdomen Pelvis W Contrast  Result Date: 09/05/2020 CLINICAL DATA:  Right lower quadrant abdominal pain over the last 5 days with diarrhea, nausea, vomiting, and fever. EXAM: CT ABDOMEN AND PELVIS WITH CONTRAST TECHNIQUE: Multidetector CT imaging of the abdomen and pelvis was performed using the standard protocol following bolus administration of intravenous contrast. CONTRAST:  175mL OMNIPAQUE IOHEXOL 300 MG/ML  SOLN COMPARISON:  10/11/2013 FINDINGS: Lower chest: Dense mitral calcification. Pacer leads noted. Right coronary artery atherosclerotic calcification. Descending thoracic aortic atherosclerosis. Hepatobiliary: Striking wall thickening in the gallbladder with single wall mural thickness up to 1.8 cm. There is a 3.4 cm in long axis gallstone proximally in the gallbladder. The mild stranding in the pericholecystic tissues. No significant biliary dilatation. No significant focal liver lesion identified. Pancreas: Unremarkable Spleen: Unremarkable Adrenals/Urinary Tract: Adrenal glands normal. Mild right hydronephrosis without hydroureter and without stone at the UPJ, query chronic mild UPJ narrowing. There is no significant asymmetry in contrast enhancement the kidneys. 0.7 cm  hypodense lesion of the right kidney upper pole is likely a small cyst but technically too small to characterize. Stomach/Bowel: Sigmoid colon diverticulosis without active diverticulitis. No dilated bowel. Normal appendix. Vascular/Lymphatic: Aortoiliac atherosclerotic vascular disease. Reproductive: Uterus absent.  Adnexa unremarkable. Other: No supplemental non-categorized findings. 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 There is also grade 1 degenerative anterolisthesis at L4-5. Lumbar spondylosis and  degenerative disc disease leading to multilevel foraminal impingement most notably on the left at L5-S1. IMPRESSION: 1. Striking wall thickening in the gallbladder with a 3.4 cm in long axis gallstone proximally in the gallbladder. Mild stranding in the pericholecystic tissues. Appearance favors acute cholecystitis, correlate with clinical presentation. No CBD dilatation. 2. Other imaging findings of potential clinical significance: Coronary atherosclerosis. Dense mitral calcification. Sigmoid colon diverticulosis. 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 Lumbar spondylosis and degenerative disc disease leading to multilevel foraminal impingement most notably on the left at L5-S1. 3. Aortic atherosclerosis. Aortic Atherosclerosis (ICD10-I70.0). Electronically Signed   By: Van Clines M.D.   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  Given duration of symptoms and imaging, patient at high risk for surgical complications given severity of infection noted on CT scan. -appreciate IR for perc chole drain -cleared by cards, so in 6-8 weeks can likely plan for interval lap chole -cont abx therapy -may start with CLD after procedure today  FEN - NPO for perc chole drain VTE - eliquis on hold, may have chemical prophylaxis from our standpoint ID - [email or web address]   LOS: 2 days     Henreitta Cea , [facility] 09/07/2020, 10:16 AM Please see Amion for pager number during day hours 7:00am-4:30pm or 7:00am -11:30am on weekends

## 2020-09-07 NOTE — Progress Notes (Signed)
PROGRESS NOTE  Tracey Mcclure  DOB: [birth year]  PCP: Crist Infante, MD YBO:[id number]  DOA: 09/05/2020  LOS: 2 days   Chief Complaint  Patient presents with  . Abdominal Pain   Brief narrative: Tracey Mcclure is a 80 y.o. female with PMH significant for symmetrical hypertrophic cardiomyopathy, A. fib on Eliquis, SSS status post PPM, HLD, hypothyroidism, MGUS. Patient presented to the ED on 1/29  presented with nausea, right upper quadrant abdominal pain and diarrhea, progressively worsening for 5 days.   In the ED, CT scan of abdomen showed thickening of the gallbladder wall with a 3.4 cm long axis gallstone which favors acute cholecystitis. WBC Count elevated to 14.7, sodium low at 130, potassium low at 2.8 Patient was admitted to hospitalist service. General surgery consultation was obtained. 1/31, patient underwent cholecystostomy by IR  Subjective: Patient was seen and examined this morning.   She has been rolled down to IR for cholecystostomy.   No acute issues at this time.   Hemodynamically stable.  No fever in last 24 hours.    Assessment/Plan: Acute cholecystitis Large gallstone -Seen by general surgery and IR. -1/31, patient underwent cholecystectomy by IR. -Currently on IV Zosyn.  History of asymmetric hypertrophic cardiomyopathy No LV outflow obstruction, not on heart failure -Echo from October 2021 showed normal EF 65 to 70%, severe asymmetric left ventricular hypertrophy of the basal septal segment with no significant LVOT gradient at rest, moderate MR, TR.  Per cardiology note, this echo was essentially stable from 2017. -Cardiology consultation obtained.  Currently at acceptable risk for intended procedure  A. fib/A-flutter Sick sinus syndrome status post PPM -Not on AV nodal blocking agent. -On Eliquis for anticoagulation.  Currently Eliquis is on hold, replaced with full dose Lovenox.  Essential hypertension -On HCTZ 12.5 mg daily at home.  Currently on  hold and is getting IV fluid.  Hypokalemia -Potassium improved with replacement.  Recent Labs  Lab 09/05/20 1138 09/06/20 0550 09/07/20 0617  K 2.8* 3.9 4.0  MG 1.7 2.1 2.0  PHOS  --  2.8 3.2   Hyponatremia -Slightly improved sodium level 133 today with hydration.  Continue to monitor. Recent Labs  Lab 09/05/20 1138 09/06/20 0550 09/07/20 0617  NA 130* 130* 133*   MGUS -Stable  Ascending aortic dilatation -On Eliquis and Lipitor.  Hypothyroidism -Currently on Synthroid at 125 mcg daily.  TSH level elevated at 12.37.  Will discuss with patient about increasing the dose. Recent Labs    09/06/20 0837  TSH 12.372*   Mobility: PT eval appreciated home health PT recommended Code Status:   Code Status: Full Code  Nutritional status: Body mass index is 29.82 kg/m.     Diet Order            Diet NPO time specified  Diet effective midnight                 DVT prophylaxis: Currently Lovenox   Antimicrobials:  IV Zosyn Fluid: Normal saline at 100 mL/h Consultants: General surgery, IR, cardiology Family Communication:  Daughter not at bedside today.  Status is: Inpatient   Remains inpatient appropriate because: Underwent cholecystostomy today.  Needs inpatient monitoring. Dispo: The patient is from: Home              Anticipated d/c is to: Home health PT              Anticipated d/c date is: 3 days  Patient currently is not medically stable to d/c.   Difficult to place patient No   Infusions:  . piperacillin-tazobactam (ZOSYN)  IV 3.375 g (09/07/20 0955)    Scheduled Meds: . acidophilus  1 capsule Oral Daily  . atorvastatin  40 mg Oral Daily  . cholecalciferol  2,000 Units Oral Daily  . enoxaparin (LOVENOX) injection  1 mg/kg Subcutaneous Q12H  . levothyroxine  125 mcg Oral Q0600  . lidocaine      . vitamin B-12  1,000 mcg Oral Daily    Antimicrobials: Anti-infectives (From admission, onward)   Start     Dose/Rate Route Frequency  Ordered Stop   09/05/20 1900  piperacillin-tazobactam (ZOSYN) IVPB 3.375 g        3.375 g 12.5 mL/hr over 240 Minutes Intravenous Every 8 hours 09/05/20 1320     09/05/20 1245  piperacillin-tazobactam (ZOSYN) IVPB 3.375 g        3.375 g 100 mL/hr over 30 Minutes Intravenous  Once 09/05/20 1235 09/05/20 1326      PRN meds: acetaminophen **OR** acetaminophen, acetaminophen, HYDROmorphone (DILAUDID) injection, ketorolac, ondansetron **OR** ondansetron (ZOFRAN) IV, prochlorperazine   Objective: Vitals:   09/07/20 1035 09/07/20 1052  BP: 136/71 (!) 127/59  Pulse: 60 61  Resp: 15 (!) 22  Temp:    SpO2: 97% 100%    Intake/Output Summary (Last 24 hours) at 09/07/2020 1126 Last data filed at 09/07/2020 1000 Gross per 24 hour  Intake 660 ml  Output 620 ml  Net 40 ml   Filed Weights   09/05/20 1122  Weight: 78.8 kg   Weight change:  Body mass index is 29.82 kg/m.   Physical Exam: General exam: Pleasant elderly Caucasian female.  Pain controlled. Skin: No rashes, lesions or ulcers. HEENT: Atraumatic, normocephalic, no obvious bleeding Lungs: Clear to auscultation bilaterally CVS: Regular rate and rhythm, low-grade murmur GI/Abd soft, mild tenderness on the right upper quadrant, bowel sound present CNS: Alert, awake, oriented x3 Psychiatry: Mood appropriate Extremities: No pedal edema, no calf tenderness  Data Review: I have personally reviewed the laboratory data and studies available.  Recent Labs  Lab 09/05/20 1138 09/06/20 0550 09/07/20 0617  WBC 14.7* 10.2 9.2  NEUTROABS 12.5*  --  6.6  HGB 13.4 11.1* 12.0  HCT 38.4 32.9* 36.3  MCV 92.8 95.6 95.8  PLT 326 299 333   Recent Labs  Lab 09/05/20 1138 09/06/20 0550 09/07/20 0617  NA 130* 130* 133*  K 2.8* 3.9 4.0  CL 95* 100 102  CO2 24 21* 22  GLUCOSE 104* 92 95  BUN 16 15 11   CREATININE 0.96 0.87 0.90  CALCIUM 8.8* 8.4* 9.0  MG 1.7 2.1 2.0  PHOS  --  2.8 3.2    F/u labs ordered  Signed, Terrilee Croak, MD [facility] 09/07/2020

## 2020-09-07 NOTE — Procedures (Signed)
Interventional Radiology Procedure Note  Procedure: Cholecystostomy drain  Indication: Cholecystitis.  Poor surgical candidate given cardiac history.  Findings: Please refer to procedural dictation for full description.  Complications: None  EBL: < 10 mL  Tracey Roux, MD [phone]

## 2020-09-08 DIAGNOSIS — K810 Acute cholecystitis: Secondary | ICD-10-CM | POA: Diagnosis not present

## 2020-09-08 LAB — CBC WITH DIFFERENTIAL/PLATELET
Abs Immature Granulocytes: 0.04 10*3/uL (ref 0.00–0.07)
Basophils Absolute: 0.1 10*3/uL (ref 0.0–0.1)
Basophils Relative: 1 %
Eosinophils Absolute: 0.2 10*3/uL (ref 0.0–0.5)
Eosinophils Relative: 4 %
HCT: 33 % — ABNORMAL LOW (ref 36.0–46.0)
Hemoglobin: 11 g/dL — ABNORMAL LOW (ref 12.0–15.0)
Immature Granulocytes: 1 %
Lymphocytes Relative: 39 %
Lymphs Abs: 1.9 10*3/uL (ref 0.7–4.0)
MCH: 32.1 pg (ref 26.0–34.0)
MCHC: 33.3 g/dL (ref 30.0–36.0)
MCV: 96.2 fL (ref 80.0–100.0)
Monocytes Absolute: 0.4 10*3/uL (ref 0.1–1.0)
Monocytes Relative: 8 %
Neutro Abs: 2.3 10*3/uL (ref 1.7–7.7)
Neutrophils Relative %: 47 %
Platelets: 331 10*3/uL (ref 150–400)
RBC: 3.43 MIL/uL — ABNORMAL LOW (ref 3.87–5.11)
RDW: 13 % (ref 11.5–15.5)
WBC: 4.9 10*3/uL (ref 4.0–10.5)
nRBC: 0 % (ref 0.0–0.2)

## 2020-09-08 LAB — BASIC METABOLIC PANEL
Anion gap: 10 (ref 5–15)
BUN: 10 mg/dL (ref 8–23)
CO2: 21 mmol/L — ABNORMAL LOW (ref 22–32)
Calcium: 8.7 mg/dL — ABNORMAL LOW (ref 8.9–10.3)
Chloride: 104 mmol/L (ref 98–111)
Creatinine, Ser: 0.92 mg/dL (ref 0.44–1.00)
GFR, Estimated: >60 mL/min (ref >60)
Glucose, Bld: 94 mg/dL (ref 70–99)
Potassium: 3.8 mmol/L (ref 3.5–5.1)
Sodium: 135 mmol/L (ref 135–145)

## 2020-09-08 MED ORDER — SODIUM CHLORIDE 0.9% FLUSH
5.0000 mL | Freq: Three times a day (TID) | INTRAVENOUS | Status: DC
  Administered 2020-09-08 – 2020-09-09 (×4): 5 mL

## 2020-09-08 NOTE — Progress Notes (Signed)
Subjective: Feeling better after perc chole drain placed yesterday.  Intermittent crampy, but mostly just hungry.  Tolerating full liquids well.  Does not want to restart her eliquis.  ROS: See above, otherwise other systems negative  Objective: Vital signs in last 24 hours: Temp:  [97.4 F (36.3 C)-98.9 F (37.2 C)] 97.4 F (36.3 C) (02/01 0615) Pulse Rate:  [58-61] 60 (02/01 0615) Resp:  [14-22] 16 (02/01 0615) BP: (127-153)/(59-83) 147/73 (02/01 0615) SpO2:  [97 %-100 %] 98 % (02/01 0615) Last BM Date: 09/07/20  Intake/Output from previous day: 01/31 0701 - 02/01 0700 In: 500 [P.O.:350; IV Piggyback:150] Out: 320 [Drains:320] Intake/Output this shift: No intake/output data recorded.  PE: Abd: soft, appropriately tender around her drain, perc chole drain with bilious output, ND  Lab Results:  Recent Labs    09/07/20 0617 09/08/20 0520  WBC 9.2 4.9  HGB 12.0 11.0*  HCT 36.3 33.0*  PLT 333 331   BMET Recent Labs    09/07/20 0617 09/08/20 0520  NA 133* 135  K 4.0 3.8  CL 102 104  CO2 22 21*  GLUCOSE 95 94  BUN 11 10  CREATININE 0.90 0.92  CALCIUM 9.0 8.7*   PT/INR No results for input(s): LABPROT, INR in the last 72 hours. CMP     Component Value Date/Time   NA 135 09/08/2020 0520   NA 137 05/19/2020 1455   NA 137 02/10/2017 0954   K 3.8 09/08/2020 0520   K 3.9 02/10/2017 0954   CL 104 09/08/2020 0520   CO2 21 (L) 09/08/2020 0520   CO2 23 02/10/2017 0954   GLUCOSE 94 09/08/2020 0520   GLUCOSE 87 02/10/2017 0954   BUN 10 09/08/2020 0520   BUN 11 05/19/2020 1455   BUN 14.8 02/10/2017 0954   CREATININE 0.92 09/08/2020 0520   CREATININE 1.0 02/10/2017 0954   CALCIUM 8.7 (L) 09/08/2020 0520   CALCIUM 10.1 02/10/2017 0954   PROT 5.6 (L) 09/06/2020 0550   PROT 7.0 02/10/2017 0954   PROT 6.4 02/10/2017 0953   ALBUMIN 2.7 (L) 09/06/2020 0550   ALBUMIN 3.7 02/10/2017 0954   AST 33 09/06/2020 0550   AST 21 02/10/2017 0954   ALT 18  09/06/2020 0550   ALT 14 02/10/2017 0954   ALKPHOS 88 09/06/2020 0550   ALKPHOS 115 02/10/2017 0954   BILITOT 1.2 09/06/2020 0550   BILITOT 0.66 02/10/2017 0954   GFRNONAA >60 09/08/2020 0520   GFRAA 61 05/19/2020 1455   Lipase  No results found for: LIPASE     Studies/Results: IR Perc Cholecystostomy  Result Date: 09/07/2020 INDICATION: Acute cholecystitis.  Postsurgical candidate. EXAM: Cholecystostomy drain placement MEDICATIONS: Zosyn; The antibiotic was administered within an appropriate time frame prior to the initiation of the procedure. ANESTHESIA/SEDATION: Moderate (conscious) sedation was employed during this procedure. A total of Versed 2 mg and Fentanyl 75 mcg was administered intravenously. Moderate Sedation Time: 10 minutes. The patient's level of consciousness and vital signs were monitored continuously by radiology nursing throughout the procedure under my direct supervision. FLUOROSCOPY TIME:  Fluoroscopy Time: 1 minutes 0 seconds (17 mGy). COMPLICATIONS: None immediate. PROCEDURE: Informed written consent was obtained from the patient after a thorough discussion of the procedural risks, benefits and alternatives. All questions were addressed. Maximal Sterile Barrier Technique was utilized including caps, mask, sterile gowns, sterile gloves, sterile drape, hand hygiene and skin antiseptic. A timeout was performed prior to the initiation of the procedure. Patient positioned supine on the  angiography table. Right upper quadrant abdominal skin prepped and draped in the usual sterile fashion. Sterile probe cover and sterile gel utilized throughout the procedure. Using ultrasound and fluoroscoping guidance, the gallbladder was accessed was accessed with a 21 ga needle. Contrast administered through the needle confirmed appropriate access within the gallbladder lumen. 0.018 inch guidewire advanced into the gallbladder lumen and the needle was exchanged for a transitional dilator set.  Percutaneous contrast administration demonstrated appropriate access the gallbladder lumen. Filling defect consistent with gallstone. 10.2 fr multipurpose pigtail drain was inserted over the guidewire. The catheter tip was positioned in the gallbladder lumen. The catheter was secured to skin with suture and connected to bag. IMPRESSION: 10.2 French cholecystostomy drain placed utilizing ultrasound fluoroscopic guidance. Electronically Signed   By: Miachel Roux M.D.   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  Will need for at least 6-8 weeks and then consider interval lap chole -will need IR follow up in 4-6 weeks for drain study. -appreciate IR for perc chole drain -cleared by cards, so in 6-8 weeks can likely plan for interval lap chole -cont abx therapy, will treat for 7 days total of abx therapy.  Can transition to augmentin at DC -[place] today -surgically stable for DC home if tolerates diet.  Patient would likely to eat today and see how she is doing tomorrow which is reasonable  FEN - HH diet VTE - eliquis on hold, Lovenox 80mg  BID (patient told me she doesn't want to restart her eliquis.  I told her that would be a conversation between her and her cardiologist).  Surgically stable to resume eliquis when IR ok's after drain placement and per patient's desire ID - [email or web address]   LOS: 3 days    Henreitta Cea , [facility] Surgery 09/08/2020, 10:24 AM Please see Amion for pager number during day hours 7:00am-4:30pm or 7:00am -11:30am on weekends

## 2020-09-08 NOTE — Progress Notes (Signed)
PROGRESS NOTE  Tracey Mcclure  DOB: Mar 12, 1941  PCP: Crist Infante, MD TDD:[id number]  DOA: 09/05/2020  LOS: 3 days   Chief Complaint  Patient presents with  . Abdominal Pain   Brief narrative: Tracey Mcclure is a 80 y.o. female with PMH significant for symmetrical hypertrophic cardiomyopathy, A. fib on Eliquis, SSS status post PPM, HLD, hypothyroidism, MGUS. Patient presented to the ED on 1/29  presented with nausea, right upper quadrant abdominal pain and diarrhea, progressively worsening for 5 days.   In the ED, CT scan of abdomen showed thickening of the gallbladder wall with a 3.4 cm long axis gallstone which favors acute cholecystitis. WBC Count elevated to 14.7, sodium low at 130, potassium low at 2.8 Patient was admitted to hospitalist service. General surgery consultation was obtained. 1/31, patient underwent cholecystostomy by IR  Subjective: Patient was seen and examined this afternoon.   Lying down in bed.  Not in distress.  Feels better.  Able to eat something every 8 days. Hemodynamically stable.  No fever in last 24 hours.    Assessment/Plan: Acute cholecystitis Large gallstone -Seen by general surgery and IR. -1/31, patient underwent cholecystectomy by IR. -Currently on IV Zosyn.  Considering oral Augmentin at discharge. -Pain controlled. -Tolerated regular consistency diet for lunch.  History of asymmetric hypertrophic cardiomyopathy No LV outflow obstruction, not on heart failure -Echo from October 2021 showed normal EF 65 to 70%, severe asymmetric left ventricular hypertrophy of the basal septal segment with no significant LVOT gradient at rest, moderate MR, TR.  Per cardiology note, this echo was essentially stable from 2017. -Cardiology consultation obtained.  Uneventful procedure  A. fib/A-flutter Sick sinus syndrome status post PPM -Not on AV nodal blocking agent. -On Eliquis for anticoagulation.  Currently Eliquis is on hold, replaced with full dose  Lovenox.  Resume Eliquis at discharge.  Essential hypertension -On HCTZ 12.5 mg daily at home.    Hyponatremia -Was low at 130 at presentation, likely because of HCTZ and poor appetite. -Improving now. -Can resume HCTZ at discharge if sodium remains stable in normal range  Hypokalemia -Potassium improved with replacement.  Recent Labs  Lab 09/05/20 1138 09/06/20 0550 09/07/20 0617 09/08/20 0520  K 2.8* 3.9 4.0 3.8  MG 1.7 2.1 2.0  --   PHOS  --  2.8 3.2  --    MGUS -Stable  Ascending aortic dilatation -On Eliquis and Lipitor.  Hypothyroidism -Currently on Synthroid at 125 mcg daily. TSH level elevated at 12.37.  Patient states last time her Synthroid dose was adjusted was 3 years ago.  She has an appointment with her PCP in a week.  Encouraged to recheck and adjust with PCP. Recent Labs    09/06/20 0837  TSH 12.372*   Mobility: PT eval appreciated.  No follow-up required. Code Status:   Code Status: Full Code  Nutritional status: Body mass index is 29.82 kg/m.     Diet Order            Diet Heart Room service appropriate? Yes; Fluid consistency: Thin  Diet effective now                 DVT prophylaxis: Currently on Lovenox   Antimicrobials:  IV Zosyn Fluid: Okay to stop IV fluid. Consultants: General surgery, IR, cardiology Family Communication:  Daughter at bedside.  Status is: Inpatient   Remains inpatient appropriate because: Underwent cholecystostomy today.  Needs inpatient monitoring. Dispo: The patient is from: Home  Anticipated d/c is to: Home               Anticipated d/c date is: Likely tomorrow              Patient currently is not medically stable to d/c.   Difficult to place patient No   Infusions:  . piperacillin-tazobactam (ZOSYN)  IV 3.375 g (09/08/20 1110)    Scheduled Meds: . acidophilus  1 capsule Oral Daily  . atorvastatin  40 mg Oral Daily  . cholecalciferol  2,000 Units Oral Daily  . enoxaparin (LOVENOX)  injection  1 mg/kg Subcutaneous Q12H  . levothyroxine  125 mcg Oral Q0600  . vitamin B-12  1,000 mcg Oral Daily    Antimicrobials: Anti-infectives (From admission, onward)   Start     Dose/Rate Route Frequency Ordered Stop   09/05/20 1900  piperacillin-tazobactam (ZOSYN) IVPB 3.375 g        3.375 g 12.5 mL/hr over 240 Minutes Intravenous Every 8 hours 09/05/20 1320     09/05/20 1245  piperacillin-tazobactam (ZOSYN) IVPB 3.375 g        3.375 g 100 mL/hr over 30 Minutes Intravenous  Once 09/05/20 1235 09/05/20 1326      PRN meds: acetaminophen **OR** acetaminophen, acetaminophen, HYDROmorphone (DILAUDID) injection, ketorolac, ondansetron **OR** ondansetron (ZOFRAN) IV, prochlorperazine   Objective: Vitals:   09/07/20 2203 09/08/20 0615  BP: 130/83 (!) 147/73  Pulse: (!) 58 60  Resp: 14 16  Temp: 98.3 F (36.8 C) (!) 97.4 F (36.3 C)  SpO2: 98% 98%    Intake/Output Summary (Last 24 hours) at 09/08/2020 1425 Last data filed at 09/08/2020 1000 Gross per 24 hour  Intake 740 ml  Output 300 ml  Net 440 ml   Filed Weights   09/05/20 1122  Weight: 78.8 kg   Weight change:  Body mass index is 29.82 kg/m.   Physical Exam: General exam: Pleasant elderly Caucasian female.  Pain controlled. Skin: No rashes, lesions or ulcers. HEENT: Atraumatic, normocephalic, no obvious bleeding Lungs: Clear to auscultation bilaterally CVS: Regular rate and rhythm, low-grade murmur GI/Abd soft, improved tenderness., bowel sound present, biliary drain in place. CNS: Alert, awake, oriented x3 Psychiatry: Mood appropriate Extremities: No pedal edema, no calf tenderness  Data Review: I have personally reviewed the laboratory data and studies available.  Recent Labs  Lab 09/05/20 1138 09/06/20 0550 09/07/20 0617 09/08/20 0520  WBC 14.7* 10.2 9.2 4.9  NEUTROABS 12.5*  --  6.6 2.3  HGB 13.4 11.1* 12.0 11.0*  HCT 38.4 32.9* 36.3 33.0*  MCV 92.8 95.6 95.8 96.2  PLT 326 299 333 331    Recent Labs  Lab 09/05/20 1138 09/06/20 0550 09/07/20 0617 09/08/20 0520  NA 130* 130* 133* 135  K 2.8* 3.9 4.0 3.8  CL 95* 100 102 104  CO2 24 21* 22 21*  GLUCOSE 104* 92 95 94  BUN 16 15 11 10   CREATININE 0.96 0.87 0.90 0.92  CALCIUM 8.8* 8.4* 9.0 8.7*  MG 1.7 2.1 2.0  --   PHOS  --  2.8 3.2  --     F/u labs ordered  Signed, Terrilee Croak, MD [facility] 09/08/2020

## 2020-09-08 NOTE — Progress Notes (Signed)
Referring Physician(s): [place]  Supervising Physician: Aletta Edouard  Patient Status:  [facility] - In-pt  Chief Complaint: Abdominal pain/cholecystitis  Subjective: Patient states she feels better since gallbladder drain placed yesterday.  She denies nausea or vomiting.   Allergies: Azithromycin  Medications: Prior to Admission medications   Medication Sig Start Date End Date Taking? Authorizing Provider  atorvastatin (LIPITOR) 40 MG tablet TAKE 1 TABLET BY MOUTH EVERYDAY AT BEDTIME 08/12/20  Yes Martinique, Peter M, MD  Cholecalciferol (VITAMIN D) 2000 units CAPS Take 2,000 Units by mouth daily.    Yes [provider]  cyanocobalamin 1000 MCG tablet Take 1,000 mcg by mouth daily.   Yes [provider]  ELIQUIS 5 MG TABS tablet TAKE 1 TABLET BY MOUTH TWICE A DAY 07/24/20  Yes Martinique, Peter M, MD  hydrochlorothiazide (HYDRODIURIL) 25 MG tablet Take 0.5 tablets (12.5 mg total) by mouth daily. 06/06/16  Yes Martinique, Peter M, MD  levothyroxine (SYNTHROID, LEVOTHROID) 125 MCG tablet Take 125 mcg by mouth daily. Every day except Sundays.   Yes [provider]  Probiotic Product (PROBIOTIC DAILY PO) Take 1 tablet by mouth 2 (two) times daily.   Yes [provider]  acetaminophen (TYLENOL) 325 MG tablet Take 650 mg by mouth every 6 (six) hours as needed for mild pain or headache.     [provider]  hydrocortisone cream 0.5 % Apply 1 application topically daily as needed for itching.    [provider]     Vital Signs: BP (!) 141/72 (BP Location: Right Arm)   Pulse 63   Temp (!) 97.5 F (36.4 C) (Oral)   Resp 16   Ht 5\' 4"  (1.626 m)   Wt 173 lb 11.2 oz (78.8 kg)   LMP  (LMP Unknown)   SpO2 99%   BMI 29.82 kg/m   Physical Exam awake, alert.  Gallbladder drain intact, insertion site clean and dry, mildly tender to palpation, output 320 cc yesterday, approximately 200 to 300 cc of blood-tinged bile in bag now; drain flushed without  difficulty  Imaging: CT Abdomen Pelvis W Contrast  Result Date: 09/05/2020 CLINICAL DATA:  Right lower quadrant abdominal pain over the last 5 days with diarrhea, nausea, vomiting, and fever. EXAM: CT ABDOMEN AND PELVIS WITH CONTRAST TECHNIQUE: Multidetector CT imaging of the abdomen and pelvis was performed using the standard protocol following bolus administration of intravenous contrast. CONTRAST:  165mL OMNIPAQUE IOHEXOL 300 MG/ML  SOLN COMPARISON:  10/11/2013 FINDINGS: Lower chest: Dense mitral calcification. Pacer leads noted. Right coronary artery atherosclerotic calcification. Descending thoracic aortic atherosclerosis. Hepatobiliary: Striking wall thickening in the gallbladder with single wall mural thickness up to 1.8 cm. There is a 3.4 cm in long axis gallstone proximally in the gallbladder. The mild stranding in the pericholecystic tissues. No significant biliary dilatation. No significant focal liver lesion identified. Pancreas: Unremarkable Spleen: Unremarkable Adrenals/Urinary Tract: Adrenal glands normal. Mild right hydronephrosis without hydroureter and without stone at the UPJ, query chronic mild UPJ narrowing. There is no significant asymmetry in contrast enhancement the kidneys. 0.7 cm hypodense lesion of the right kidney upper pole is likely a small cyst but technically too small to characterize. Stomach/Bowel: Sigmoid colon diverticulosis without active diverticulitis. No dilated bowel. Normal appendix. Vascular/Lymphatic: Aortoiliac atherosclerotic vascular disease. Reproductive: Uterus absent.  Adnexa unremarkable. Other: No supplemental non-categorized findings. 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 There is also grade 1 degenerative anterolisthesis at L4-5. Lumbar spondylosis and degenerative disc disease leading  to multilevel foraminal impingement most notably on the left at L5-S1. IMPRESSION: 1. Striking wall thickening in the gallbladder  with a 3.4 cm in long axis gallstone proximally in the gallbladder. Mild stranding in the pericholecystic tissues. Appearance favors acute cholecystitis, correlate with clinical presentation. No CBD dilatation. 2. Other imaging findings of potential clinical significance: Coronary atherosclerosis. Dense mitral calcification. Sigmoid colon diverticulosis. 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 Lumbar spondylosis and degenerative disc disease leading to multilevel foraminal impingement most notably on the left at L5-S1. 3. Aortic atherosclerosis. Aortic Atherosclerosis (ICD10-I70.0). Electronically Signed   By: Van Clines M.D.   On: 09/05/2020 12:30   IR Perc Cholecystostomy  Result Date: 09/07/2020 INDICATION: Acute cholecystitis.  Postsurgical candidate. EXAM: Cholecystostomy drain placement MEDICATIONS: Zosyn; The antibiotic was administered within an appropriate time frame prior to the initiation of the procedure. ANESTHESIA/SEDATION: Moderate (conscious) sedation was employed during this procedure. A total of Versed 2 mg and Fentanyl 75 mcg was administered intravenously. Moderate Sedation Time: 10 minutes. The patient's level of consciousness and vital signs were monitored continuously by radiology nursing throughout the procedure under my direct supervision. FLUOROSCOPY TIME:  Fluoroscopy Time: 1 minutes 0 seconds (17 mGy). COMPLICATIONS: None immediate. PROCEDURE: Informed written consent was obtained from the patient after a thorough discussion of the procedural risks, benefits and alternatives. All questions were addressed. Maximal Sterile Barrier Technique was utilized including caps, mask, sterile gowns, sterile gloves, sterile drape, hand hygiene and skin antiseptic. A timeout was performed prior to the initiation of the procedure. Patient positioned supine on the angiography table. Right upper quadrant abdominal skin prepped and draped in the  usual sterile fashion. Sterile probe cover and sterile gel utilized throughout the procedure. Using ultrasound and fluoroscoping guidance, the gallbladder was accessed was accessed with a 21 ga needle. Contrast administered through the needle confirmed appropriate access within the gallbladder lumen. 0.018 inch guidewire advanced into the gallbladder lumen and the needle was exchanged for a transitional dilator set. Percutaneous contrast administration demonstrated appropriate access the gallbladder lumen. Filling defect consistent with gallstone. 10.2 fr multipurpose pigtail drain was inserted over the guidewire. The catheter tip was positioned in the gallbladder lumen. The catheter was secured to skin with suture and connected to bag. IMPRESSION: 10.2 French cholecystostomy drain placed utilizing ultrasound fluoroscopic guidance. Electronically Signed   By: Miachel Roux M.D.   On: 09/07/2020 11:12    Labs:  CBC: Recent Labs    09/05/20 1138 09/06/20 0550 09/07/20 0617 09/08/20 0520  WBC 14.7* 10.2 9.2 4.9  HGB 13.4 11.1* 12.0 11.0*  HCT 38.4 32.9* 36.3 33.0*  PLT 326 299 333 331    COAGS: No results for input(s): INR, APTT in the last 8760 hours.  BMP: Recent Labs    05/19/20 1455 09/05/20 1138 09/06/20 0550 09/07/20 0617 09/08/20 0520  NA 137 130* 130* 133* 135  K 3.9 2.8* 3.9 4.0 3.8  CL 99 95* 100 102 104  CO2 26 24 21* 22 21*  GLUCOSE 70 104* 92 95 94  BUN 11 16 15 11 10   CALCIUM 9.3 8.8* 8.4* 9.0 8.7*  CREATININE 1.02* 0.96 0.87 0.90 0.92  GFRNONAA 53* >60 >60 >60 >60  GFRAA 61  --   --   --   --     LIVER FUNCTION TESTS: Recent Labs    09/05/20 1138 09/06/20 0550  BILITOT 1.9* 1.2  AST 33 33  ALT 18 18  ALKPHOS 95  88  PROT 6.8 5.6*  ALBUMIN 3.3* 2.7*    Assessment and Plan: Patient with history of A. fib, HOCM, MGUS, acute cholecystitis, status post percutaneous cholecystostomy on 1/31; afebrile, WBC normal, hemoglobin 11, creatinine normal, bile  cultures negative to date; continue gallbladder drain irrigation, output monitoring; gallbladder drain will need to remain in place at least 4 to 6 weeks unless cholecystectomy done in the interim; once discharged recommend once daily irrigation of drain with 5 cc sterile saline, output monitoring and dressing changes every 1 to 2 days; patient will be scheduled for follow-up cholangiogram in 4 to 6 weeks; ideally would be best to resume oral anticoagulation once mild hemobilia resolves   Electronically Signed: D. Rowe Robert, PA-C 09/08/2020, 4:07 PM   I spent a total of 15 minutes at the the patient's bedside AND on the patient's hospital floor or unit, greater than 50% of which was counseling/coordinating care for gallbladder drain    Patient ID: Tracey Mcclure, female   DOB: May 15, 1941, 80 y.o.   MRN: [id number]

## 2020-09-08 NOTE — Discharge Instructions (Signed)
Cholecystostomy, Care After This sheet gives you information about how to care for yourself after your procedure. Your health care provider may also give you more specific instructions. If you have problems or questions, contact your health care provider. What can I expect after the procedure? After your procedure, it is common to have soreness near the incision site of your drainage tube (catheter). Follow these instructions at home: Incision care  Follow instructions from your health care provider about how to take care of your incision site where the catheter was inserted. Make sure you: ? Wash your hands with soap and water before and after you change your bandage (dressing). If soap and water are not available, use hand sanitizer. ? Change your dressing as told by your health care provider.  Check the incision site every day for signs of infection. Check for: ? Redness, swelling, or pain. ? Fluid or blood. ? Warmth. ? Pus or a bad smell.  Do not take baths, swim, or use a hot tub until your health care provider approves. Ask your health care provider if you may take showers. You may only be allowed to take sponge baths.   General instructions  Follow instructions from your health care provider about how to care for your catheter and collection bag at home.  Your health care provider will show you: ? How to record the amount of drainage from the catheter. ? How to flush the catheter. ? How to care for the catheter incision site.  Follow instructions from your health care provider about eating or drinking restrictions.  Take over-the-counter and prescription medicines only as told by your health care provider.  Keep all follow-up visits as told by your health care provider. This is important. Contact a health care provider if:  You have redness, swelling, or pain around the catheter incision site.  You have nausea or vomiting. Get help right away if:  Your abdominal pain  gets worse.  You feel dizzy or you faint while standing.  You have fluid or blood coming from the catheter incision site.  The area around the catheter incision site feels warm to the touch.  You have pus or a bad smell coming from the catheter incision site.  You have a fever.  You have shortness of breath.  You have a rapid heartbeat.  Your nausea or vomiting does not go away.  Your catheter becomes blocked.  Your catheter comes out of your abdomen. Summary  After your procedure, it is common to have soreness near the incision site of your drainage tube (catheter).  Wash your hands with soap and water before and after you change your bandage (dressing). Change your dressing as told by your health care provider.  Check the catheter incision site every day for signs of infection. Check for redness, swelling, pain, fluid, blood, warmth, pus, or a bad smell.  Contact your health care provider if you have nausea or vomiting, or if you have redness, swelling, or pain around your catheter incision site.  Get help right away if your abdominal pain gets worse, you feel dizzy, you have blood or fluid coming from the catheter incision site, you have a fever, or you have shortness of breath. This information is not intended to replace advice given to you by your health care provider. Make sure you discuss any questions you have with your health care provider. Document Revised: 02/19/2018 Document Reviewed: 02/19/2018 Elsevier Patient Education  [place].

## 2020-09-08 NOTE — Progress Notes (Signed)
Physical Therapy Treatment Patient Details Name: VONDRA ALDREDGE MRN: [id number] DOB: 1941-03-01 Today's Date: 09/08/2020    History of Present Illness 80 yo female admitted with cholecysitits. Hx of AV block, Afib, pacemaker    PT Comments    Pt feeling better.  General Comments: AxO x 3 very pleasant and motivated.  Stated "I can't take that pain medicine, it makes me loopy". Assisted with amb an increased distance in hallway.  General Gait Details: good alternating gait with no need for any AD and WFL gait speed.    Follow Up Recommendations  No PT follow up (pt appears at prior level of mobility)     Equipment Recommendations  None recommended by PT    Recommendations for Other Services       Precautions / Restrictions Precautions Precautions: Fall Restrictions Weight Bearing Restrictions: No    Mobility  Bed Mobility Overal bed mobility: Modified Independent                Transfers Overall transfer level: Modified independent               General transfer comment: good safety cognition and use of hands to steady self  Ambulation/Gait Ambulation/Gait assistance: Supervision Gait Distance (Feet): 285 Feet Assistive device: None Gait Pattern/deviations: Step-through pattern     General Gait Details: good alternating gait with no need for any AD and WFL gait speed   Stairs             Wheelchair Mobility    Modified Rankin (Stroke Patients Only)       Balance                                            Cognition Arousal/Alertness: Awake/alert Behavior During Therapy: WFL for tasks assessed/performed Overall Cognitive Status: Within Functional Limits for tasks assessed                                 General Comments: AxO x 3 very pleasant and motivated.  Stated "I can't take that pain medicine, it makes me loopy".      Exercises      General Comments        Pertinent Vitals/Pain Pain  Assessment: No/denies pain    Home Living                      Prior Function            PT Goals (current goals can now be found in the care plan section) Progress towards PT goals: Progressing toward goals    Frequency    Min 3X/week      PT Plan Current plan remains appropriate    Co-evaluation              AM-PAC PT "6 Clicks" Mobility   Outcome Measure  Help needed turning from your back to your side while in a flat bed without using bedrails?: None Help needed moving from lying on your back to sitting on the side of a flat bed without using bedrails?: None Help needed moving to and from a bed to a chair (including a wheelchair)?: None Help needed standing up from a chair using your arms (e.g., wheelchair or bedside chair)?: None Help needed to walk in hospital room?:  None Help needed climbing 3-5 steps with a railing? : None 6 Click Score: 24    End of Session Equipment Utilized During Treatment: Gait belt Activity Tolerance: Patient tolerated treatment well Patient left: in bed;with call bell/phone within reach;with family/visitor present   PT Visit Diagnosis: Unsteadiness on feet (R26.81)     Time: 1025-8527 PT Time Calculation (min) (ACUTE ONLY): 10 min  Charges:  $Gait Training: 8-22 mins                     {Kalim Kissel  PTA Acute  Rehabilitation [employer]      [phone] Office      [phone]

## 2020-09-08 NOTE — Progress Notes (Signed)
   Patient tolerated cholecystostomy tube placement without any immediate complications. BP and HR stable following the procedure. She remains on therapeutic lovenox for stroke ppx at this time.   [facility] will sign off.  Please contact us again if any new issues arise. Medication Recommendations:  Recommend restarting home eliquis when cleared to do so by surgery. Would restart home HCTZ at discharge as well.  Other recommendations (labs, testing, etc):  None Follow up as an outpatient:  Patient is scheduled to see Dr. Martinique 10/08/20 for routine follow-up.   Abigail Butts, PA-C 09/08/20; 9:38 AM

## 2020-09-09 DIAGNOSIS — I10 Essential (primary) hypertension: Secondary | ICD-10-CM

## 2020-09-09 DIAGNOSIS — I4811 Longstanding persistent atrial fibrillation: Secondary | ICD-10-CM

## 2020-09-09 DIAGNOSIS — I77819 Aortic ectasia, unspecified site: Secondary | ICD-10-CM | POA: Diagnosis present

## 2020-09-09 DIAGNOSIS — E871 Hypo-osmolality and hyponatremia: Secondary | ICD-10-CM

## 2020-09-09 DIAGNOSIS — K810 Acute cholecystitis: Secondary | ICD-10-CM | POA: Diagnosis not present

## 2020-09-09 DIAGNOSIS — I422 Other hypertrophic cardiomyopathy: Secondary | ICD-10-CM

## 2020-09-09 DIAGNOSIS — E876 Hypokalemia: Secondary | ICD-10-CM

## 2020-09-09 DIAGNOSIS — E039 Hypothyroidism, unspecified: Secondary | ICD-10-CM

## 2020-09-09 LAB — BASIC METABOLIC PANEL
Anion gap: 6 (ref 5–15)
BUN: 8 mg/dL (ref 8–23)
CO2: 25 mmol/L (ref 22–32)
Calcium: 8.4 mg/dL — ABNORMAL LOW (ref 8.9–10.3)
Chloride: 104 mmol/L (ref 98–111)
Creatinine, Ser: 0.94 mg/dL (ref 0.44–1.00)
GFR, Estimated: >60 mL/min (ref >60)
Glucose, Bld: 98 mg/dL (ref 70–99)
Potassium: 3.3 mmol/L — ABNORMAL LOW (ref 3.5–5.1)
Sodium: 135 mmol/L (ref 135–145)

## 2020-09-09 LAB — CBC WITH DIFFERENTIAL/PLATELET
Abs Immature Granulocytes: 0.03 10*3/uL (ref 0.00–0.07)
Basophils Absolute: 0.1 10*3/uL (ref 0.0–0.1)
Basophils Relative: 1 %
Eosinophils Absolute: 0.2 10*3/uL (ref 0.0–0.5)
Eosinophils Relative: 4 %
HCT: 34.1 % — ABNORMAL LOW (ref 36.0–46.0)
Hemoglobin: 11.3 g/dL — ABNORMAL LOW (ref 12.0–15.0)
Immature Granulocytes: 1 %
Lymphocytes Relative: 39 %
Lymphs Abs: 1.8 10*3/uL (ref 0.7–4.0)
MCH: 32.3 pg (ref 26.0–34.0)
MCHC: 33.1 g/dL (ref 30.0–36.0)
MCV: 97.4 fL (ref 80.0–100.0)
Monocytes Absolute: 0.4 10*3/uL (ref 0.1–1.0)
Monocytes Relative: 8 %
Neutro Abs: 2.1 10*3/uL (ref 1.7–7.7)
Neutrophils Relative %: 47 %
Platelets: 363 10*3/uL (ref 150–400)
RBC: 3.5 MIL/uL — ABNORMAL LOW (ref 3.87–5.11)
RDW: 13 % (ref 11.5–15.5)
WBC: 4.5 10*3/uL (ref 4.0–10.5)
nRBC: 0 % (ref 0.0–0.2)

## 2020-09-09 LAB — PHOSPHORUS: Phosphorus: 3.6 mg/dL (ref 2.5–4.6)

## 2020-09-09 LAB — MAGNESIUM: Magnesium: 1.8 mg/dL (ref 1.7–2.4)

## 2020-09-09 MED ORDER — POTASSIUM CHLORIDE CRYS ER 20 MEQ PO TBCR
40.0000 meq | EXTENDED_RELEASE_TABLET | Freq: Once | ORAL | Status: AC
  Administered 2020-09-09: 10:00:00 40 meq via ORAL
  Filled 2020-09-09: qty 2

## 2020-09-09 MED ORDER — MAGNESIUM SULFATE 2 GM/50ML IV SOLN: 2.0000 g | Freq: Once | INTRAVENOUS | Status: DC

## 2020-09-09 MED ORDER — ONDANSETRON HCL 4 MG PO TABS: 4.0000 mg | ORAL_TABLET | Freq: Four times a day (QID) | ORAL | 0 refills | Status: DC | PRN

## 2020-09-09 MED ORDER — AMOXICILLIN-POT CLAVULANATE 875-125 MG PO TABS: 1.0000 | ORAL_TABLET | Freq: Two times a day (BID) | ORAL | 0 refills | Status: AC

## 2020-09-09 NOTE — Progress Notes (Incomplete)
PROGRESS NOTE    Tracey Mcclure  DEY:[id number] DOB: Jul 21, 1941 DOA: 09/05/2020 PCP: Crist Infante, MD (Confirm with patient/family/NH records and if not entered, this HAS to be entered at [facility] point of entry. "No PCP" if truly none.)   Chief Complaint  Patient presents with  . Abdominal Pain    Brief Narrative: (Start on day 1 of progress note - keep it brief and live) ***   Assessment & Plan:   Active Problems:   Acute cholecystitis   Hypokalemia   ***   DVT prophylaxis: (Lovenox/Heparin/SCD's/anticoagulated/None (if comfort care) Code Status: (Full/Partial - specify details) Family Communication: (Specify name, relationship & date discussed. NO "discussed with patient") Disposition:   Status is: Inpatient  {Inpatient:23812}  Dispo: The patient is from: {From:23814}              Anticipated d/c is to: {To:23815}              Anticipated d/c date is: {Days:23816}              Patient currently {Medically stable:23817}   Difficult to place patient {Yes/No:25151}       Consultants:   ***  Procedures: (Don't include imaging studies which can be auto populated. Include things that cannot be auto populated i.e. Echo, Carotid and venous dopplers, Foley, Bipap, HD, tubes/drains, wound vac, central lines etc)  ***  Antimicrobials: (specify start and planned stop date. Auto populated tables are space occupying and do not give end dates)  ***    Subjective: ***  Objective: Vitals:   09/08/20 1440 09/08/20 2112 09/08/20 2252 09/09/20 0616  BP: (!) 141/72 (!) 158/85 137/79 134/73  Pulse: 63 72 60 62  Resp: 16 17 18 18   Temp: (!) 97.5 F (36.4 C) (!) 97.5 F (36.4 C) 98 F (36.7 C) 98.1 F (36.7 C)  TempSrc: Oral Oral Oral   SpO2: 99% 100% 97% 97%  Weight:      Height:        Intake/Output Summary (Last 24 hours) at 09/09/2020 1212 Last data filed at 09/09/2020 0913 Gross per 24 hour  Intake 870 ml  Output 780 ml  Net 90 ml   Filed Weights    09/05/20 1122  Weight: 78.8 kg    Examination:  General exam: Appears calm and comfortable  Respiratory system: Clear to auscultation. Respiratory effort normal. Cardiovascular system: S1 & S2 heard, RRR. No JVD, murmurs, rubs, gallops or clicks. No pedal edema. Gastrointestinal system: Abdomen is nondistended, soft and nontender. No organomegaly or masses felt. Normal bowel sounds heard. Central nervous system: Alert and oriented. No focal neurological deficits. Extremities: Symmetric 5 x 5 power. Skin: No rashes, lesions or ulcers Psychiatry: Judgement and insight appear normal. Mood & affect appropriate.     Data Reviewed: I have personally reviewed following labs and imaging studies  CBC: Recent Labs  Lab 09/05/20 1138 09/06/20 0550 09/07/20 0617 09/08/20 0520 09/09/20 0538  WBC 14.7* 10.2 9.2 4.9 4.5  NEUTROABS 12.5*  --  6.6 2.3 2.1  HGB 13.4 11.1* 12.0 11.0* 11.3*  HCT 38.4 32.9* 36.3 33.0* 34.1*  MCV 92.8 95.6 95.8 96.2 97.4  PLT 326 299 333 331 314    Basic Metabolic Panel: Recent Labs  Lab 09/05/20 1138 09/06/20 0550 09/07/20 0617 09/08/20 0520 09/09/20 0538  NA 130* 130* 133* 135 135  K 2.8* 3.9 4.0 3.8 3.3*  CL 95* 100 102 104 104  CO2 24 21* 22 21* 25  GLUCOSE 104* 92  95 94 98  BUN 16 15 11 10 8   CREATININE 0.96 0.87 0.90 0.92 0.94  CALCIUM 8.8* 8.4* 9.0 8.7* 8.4*  MG 1.7 2.1 2.0  --  1.8  PHOS  --  2.8 3.2  --  3.6    GFR: Estimated Creatinine Clearance: 49.3 mL/min (by C-G formula based on SCr of 0.94 mg/dL).  Liver Function Tests: Recent Labs  Lab 09/05/20 1138 09/06/20 0550  AST 33 33  ALT 18 18  ALKPHOS 95 88  BILITOT 1.9* 1.2  PROT 6.8 5.6*  ALBUMIN 3.3* 2.7*    CBG: Recent Labs  Lab 09/06/20 0751  GLUCAP 103*     Recent Results (from the past 240 hour(s))  Urine culture     Status: Abnormal   Collection Time: 09/05/20 11:38 AM   Specimen: Urine, Random  Result Value Ref Range Status   Specimen Description   Final     URINE, RANDOM Performed at [facility], [address] [place], [place] [address]    Special Requests   Final    NONE Performed at [facility], [address] [place], [place] [address]    Culture (A)  Final    <10,000 COLONIES/mL INSIGNIFICANT GROWTH Performed at [facility], [place] [address], [place], [place] [address]    Report Status 09/06/2020 FINAL  Final  SARS Coronavirus 2 by RT PCR (hospital order, performed in [facility] hospital lab) Nasopharyngeal Nasopharyngeal Swab     Status: None   Collection Time: 09/05/20 12:35 PM   Specimen: Nasopharyngeal Swab  Result Value Ref Range Status   SARS Coronavirus 2 NEGATIVE NEGATIVE Final    Comment: (NOTE) SARS-CoV-2 target nucleic acids are NOT DETECTED.  The SARS-CoV-2 RNA is generally detectable in upper and lower respiratory specimens during the acute phase of infection. The lowest concentration of SARS-CoV-2 viral copies this assay can detect is 250 copies / mL. A negative result does not preclude SARS-CoV-2 infection and should not be used as the sole basis for treatment or other patient management decisions.  A negative result may occur with improper specimen collection / handling, submission of specimen other than nasopharyngeal swab, presence of viral mutation(s) within the areas targeted by this assay, and inadequate number of viral copies (<250 copies / mL). A negative result must be combined with clinical observations, patient history, and epidemiological information.  Fact Sheet for Patients:   StrictlyIdeas.no  Fact Sheet for Healthcare Providers: BankingDealers.co.za  This test is not yet approved or  cleared by the [place] FDA and has been authorized for detection and/or diagnosis of SARS-CoV-2 by FDA under an Emergency Use Authorization (EUA).  This EUA will remain in effect (meaning this test can be used) for the duration  of the COVID-19 declaration under Section 564(b)(1) of the Act, 21 U.S.C. section 360bbb-3(b)(1), unless the authorization is terminated or revoked sooner.  Performed at [facility], [address] [place], [place] [address]   Aerobic/Anaerobic Culture (surgical/deep wound)     Status: None (Preliminary result)   Collection Time: 09/07/20 10:30 AM   Specimen: Gallbladder; Bile  Result Value Ref Range Status   Specimen Description   Final    GALL BLADDER BILE Performed at [facility], [place] [address], [place], [place] [address]    Special Requests   Final    Normal Performed at [facility], [place] [address], [place], [place] [address]  Gram Stain   Final    FEW WBC PRESENT,BOTH PMN AND MONONUCLEAR NO ORGANISMS SEEN    Culture   Final    NO GROWTH 2 DAYS Performed at [place] [address], [place], [place] [address]    Report Status PENDING  Incomplete         Radiology Studies: No results found.      Scheduled Meds: . acidophilus  1 capsule Oral Daily  . atorvastatin  40 mg Oral Daily  . cholecalciferol  2,000 Units Oral Daily  . enoxaparin (LOVENOX) injection  1 mg/kg Subcutaneous Q12H  . levothyroxine  125 mcg Oral Q0600  . sodium chloride flush  5 mL Intracatheter Q8H  . vitamin B-12  1,000 mcg Oral Daily   Continuous Infusions: . magnesium sulfate bolus IVPB    . piperacillin-tazobactam (ZOSYN)  IV 3.375 g (09/09/20 0340)     LOS: 4 days    Time spent: ***    Irine Seal, MD [facility]   To contact the attending provider between 7A-7P or the covering provider during after hours 7P-7A, please log into the web site [email or web address] and access using universal [place] password for that web site. If you do not have the password, please call the hospital operator.  09/09/2020, 12:12 PM

## 2020-09-09 NOTE — Progress Notes (Signed)
Subjective: Patient states she wants to go home.Tolerating HH diet. Having multiple small volume non-bloody BMs daily    ROS: See above, otherwise other systems negative  Objective: Vital signs in last 24 hours: Temp:  [97.5 F (36.4 C)-98.1 F (36.7 C)] 98.1 F (36.7 C) (02/02 0616) Pulse Rate:  [60-72] 62 (02/02 0616) Resp:  [16-18] 18 (02/02 0616) BP: (134-158)/(72-85) 134/73 (02/02 0616) SpO2:  [97 %-100 %] 97 % (02/02 0616) Last BM Date: 09/07/20  Intake/Output from previous day: 02/01 0701 - 02/02 0700 In: 990 [P.O.:840; IV Piggyback:150] Out: 730 [Urine:650; Drains:80] Intake/Output this shift: No intake/output data recorded.  PE: Abd: soft, appropriately tender around her drain, perc chole drain with bilious output (80cc/24h documented, 80cc in bag currently), ND  Lab Results:  Recent Labs    09/08/20 0520 09/09/20 0538  WBC 4.9 4.5  HGB 11.0* 11.3*  HCT 33.0* 34.1*  PLT 331 363   BMET Recent Labs    09/08/20 0520 09/09/20 0538  NA 135 135  K 3.8 3.3*  CL 104 104  CO2 21* 25  GLUCOSE 94 98  BUN 10 8  CREATININE 0.92 0.94  CALCIUM 8.7* 8.4*   PT/INR No results for input(s): LABPROT, INR in the last 72 hours. CMP     Component Value Date/Time   NA 135 09/09/2020 0538   NA 137 05/19/2020 1455   NA 137 02/10/2017 0954   K 3.3 (L) 09/09/2020 0538   K 3.9 02/10/2017 0954   CL 104 09/09/2020 0538   CO2 25 09/09/2020 0538   CO2 23 02/10/2017 0954   GLUCOSE 98 09/09/2020 0538   GLUCOSE 87 02/10/2017 0954   BUN 8 09/09/2020 0538   BUN 11 05/19/2020 1455   BUN 14.8 02/10/2017 0954   CREATININE 0.94 09/09/2020 0538   CREATININE 1.0 02/10/2017 0954   CALCIUM 8.4 (L) 09/09/2020 0538   CALCIUM 10.1 02/10/2017 0954   PROT 5.6 (L) 09/06/2020 0550   PROT 7.0 02/10/2017 0954   PROT 6.4 02/10/2017 0953   ALBUMIN 2.7 (L) 09/06/2020 0550   ALBUMIN 3.7 02/10/2017 0954   AST 33 09/06/2020 0550   AST 21 02/10/2017 0954   ALT 18 09/06/2020 0550    ALT 14 02/10/2017 0954   ALKPHOS 88 09/06/2020 0550   ALKPHOS 115 02/10/2017 0954   BILITOT 1.2 09/06/2020 0550   BILITOT 0.66 02/10/2017 0954   GFRNONAA >60 09/09/2020 0538   GFRAA 61 05/19/2020 1455   Lipase  No results found for: LIPASE     Studies/Results: IR Perc Cholecystostomy  Result Date: 09/07/2020 INDICATION: Acute cholecystitis.  Postsurgical candidate. EXAM: Cholecystostomy drain placement MEDICATIONS: Zosyn; The antibiotic was administered within an appropriate time frame prior to the initiation of the procedure. ANESTHESIA/SEDATION: Moderate (conscious) sedation was employed during this procedure. A total of Versed 2 mg and Fentanyl 75 mcg was administered intravenously. Moderate Sedation Time: 10 minutes. The patient's level of consciousness and vital signs were monitored continuously by radiology nursing throughout the procedure under my direct supervision. FLUOROSCOPY TIME:  Fluoroscopy Time: 1 minutes 0 seconds (17 mGy). COMPLICATIONS: None immediate. PROCEDURE: Informed written consent was obtained from the patient after a thorough discussion of the procedural risks, benefits and alternatives. All questions were addressed. Maximal Sterile Barrier Technique was utilized including caps, mask, sterile gowns, sterile gloves, sterile drape, hand hygiene and skin antiseptic. A timeout was performed prior to the initiation of the procedure. Patient positioned supine on the angiography table. Right  upper quadrant abdominal skin prepped and draped in the usual sterile fashion. Sterile probe cover and sterile gel utilized throughout the procedure. Using ultrasound and fluoroscoping guidance, the gallbladder was accessed was accessed with a 21 ga needle. Contrast administered through the needle confirmed appropriate access within the gallbladder lumen. 0.018 inch guidewire advanced into the gallbladder lumen and the needle was exchanged for a transitional dilator set. Percutaneous  contrast administration demonstrated appropriate access the gallbladder lumen. Filling defect consistent with gallstone. 10.2 fr multipurpose pigtail drain was inserted over the guidewire. The catheter tip was positioned in the gallbladder lumen. The catheter was secured to skin with suture and connected to bag. IMPRESSION: 10.2 French cholecystostomy drain placed utilizing ultrasound fluoroscopic guidance. Electronically Signed   By: Miachel Roux M.D.   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  Will need for at least 6-8 weeks and then consider interval lap chole -will need IR follow up in 4-6 weeks for drain study, appreciate IR for perc chole drain -cleared by cards, so in 6-8 weeks can likely plan for interval lap chole -cont abx therapy, will treat for 7 days total of abx therapy.  Can transition to augmentin at DC -[place]  -surgically stable for DC home  FEN - HH diet VTE - Lovenox 80mg  BID, pt now seems agreeable to resumption of DOAC. Will defer to [place] ID - [email or web address]   LOS: 4 days    Jill Alexanders , [facility] 09/09/2020, 9:07 AM Please see Amion for pager number during day hours 7:00am-4:30pm or 7:00am -11:30am on weekends

## 2020-09-09 NOTE — Discharge Summary (Signed)
Physician Discharge Summary  Tracey Mcclure [id number] DOB: [birth year] DOA: 09/05/2020  PCP: Crist Infante, MD  Admit date: 09/05/2020 Discharge date: 09/09/2020  Time spent: 60 minutes  Recommendations for Outpatient Follow-up:  1. Follow-up with Dr.Stechschulte, general surgery on 10/14/2020 2. Follow-up with IR drain clinic as scheduled. 3. Follow-up with Crist Infante, MD in 1 week.  On follow-up patient will need a basic metabolic profile done to follow-up on electrolytes, renal function.  Patient also need a magnesium level checked.  Patient's TSH will need to be reassessed on follow-up. 4. Follow-up with Dr. Martinique, cardiology as scheduled.   Discharge Diagnoses:  Principal Problem:   Acute cholecystitis Active Problems:   HTN (hypertension)   Hypothyroidism   MGUS (monoclonal gammopathy of unknown significance)   Atrial fibrillation (HCC)   Pacemaker   Hypokalemia   Hyponatremia   Hypertrophic cardiomyopathy (HCC)   Aortic dilatation (HCC)   Discharge Condition: Stable and improved  Diet recommendation: Heart healthy  Filed Weights   09/05/20 1122  Weight: 78.8 kg    History of present illness:  HPI per Dr.Zhang Tracey Mcclure is a 80 y.o. female with medical history significant of asymmetrical hypertrophic cardiomyopathy, A. fib on Eliquis, SSS status post PPM, HLD, hypothyroidism, MGUS,  presented with new onset of RUQ pain, diarrhea and feeling nausea.  Symptoms started 5 days ago, initially with epigastric pain cramping like, associated with loose diarrhea 2-3 times a day.  She thought it was her diverticulitis came back and she did bowel rest and was hoping that episode will pass by its own.  Diarrhea did stop 3 days ago, however she developed a feeling of nausea and frequent vomiting after eating and last 2 days she has been able to eat or drink much.  Spiked fever 101 2 nights ago.  She denied any chest pain, cough urinary symptoms.  ED Course: CT abdomen  showed thickening of the gallbladder wall with a 3.4 cm long axis gallstone, and then fevers acute cholecystitis.  WBC 14.7.  Vital signs stable no fever.  Hospital Course:  #1 acute cholecystitis/large gallstone Patient had presented with right upper quadrant pain and epigastric cramping-like pain, diarrhea, nausea.  CT abdomen and pelvis which was done showed a thickening of the gallbladder wall with a 3.4 cm long axis gallstone.  Patient noted to have fevers and concerning for acute cholecystitis.  Patient noted with a leukocytosis white count of 14.7.  Patient placed empirically on IV Zosyn, general surgery was consulted who recommended initially operative intervention versus possible IR drain placement.  Patient was seen by cardiology for cardiac clearance.  IR was subsequently consulted and cholecystostomy drain placed on 09/07/2020 without any complications.  Patient subsequently started on a diet which he tolerated.  Patient improved clinically.  Patient remained afebrile.  Leukocytosis trended down and had resolved by day of discharge.  Patient be discharged home with drain in place and will follow up with IR in drain clinic in 4 to 6 weeks.  Patient will also follow-up with general surgery who will determine timing of interval lap cholecystectomy.  Patient be discharged home on 4 more days of oral Augmentin to complete a 7-day course of antibiotic treatment.  Patient be discharged home in stable and improved condition.  2.  History of asymmetric hypertrophic cardiomyopathy/no LV outflow n obstruction/not in heart failure 2D echo from October 2021 showed a normal EF of 65 to 70%, severe asymmetric left ventricular hypertrophy of the basal septal segment with no  significant LVOT gradient at rest, moderate MR, TR.  Cardiology was seen in consultation and per cardiology echocardiogram was essentially stable from 2017.  Cardiology recommended resumption of thiazide post discharge.  Per cardiology  patient with low to moderate increasing surgical risk for cardiovascular complication however patient noted to have good functional status and cardiovascular conditions well compensated.  Patient was cleared for procedures.  Outpatient follow-up with cardiology.  3.  A. fib/a flutter/sick sinus syndrome status post PPM Patient noted not on any AV nodal blocking agents.  Patient's anticoagulation of Eliquis was held during the hospitalization and patient placed on full dose Lovenox.  Eliquis will be resumed post discharge.  Outpatient follow-up with cardiology.  4.  Hypertension Remained stable during the hospitalization.  Patient's HCTZ was held and will be resumed on discharge.  Outpatient follow-up with PCP.  5.  Hyponatremia Patient noted to have a sodium as low as 130 on presentation felt likely multifactorial secondary to poor oral intake in the setting of HCTZ.  HCTZ was held.  Patient gently hydrated.  Hyponatremia had improved and had resolved by day of discharge.  Patient's HCTZ will be outpatient follow-up with PCP.  6.  Hypokalemia Repleted.  Outpatient follow-up.  7.  MGUS Stable.  8.  Ascending aortic dilatation Patient maintained on Lipitor during the hospitalization.  Patient also maintained on anticoagulation with Lovenox as Eliquis was held and Eliquis will be resumed on discharge.  9.  Hypothyroidism Patient maintained on home regimen of Synthroid.  TSH obtained during the hospitalization noted to be elevated at 12.372.  Outpatient follow-up with PCP with repeat labs in the outpatient setting.   Procedures:  CT abdomen and pelvis 09/05/2020  Cholecystostomy drain placement per IR, Dr. Sharen Heck 09/07/2020  Consultations:  General surgery: Dr. Marcello Moores 09/05/2020  Cardiology: Dr. Sallyanne Kuster 09/06/2020  Interventional radiology: Dr. Laurence Ferrari 09/06/2020  Discharge Exam: Vitals:   09/09/20 1200 09/09/20 1319  BP: (!) 149/81 (!) 160/84  Pulse: 63 70  Resp: 18 17  Temp:  97.9 F (36.6 C) 97.9 F (36.6 C)  SpO2: 98% 100%    General: NAD Cardiovascular: RRR with 3/6 SEM Respiratory: Clear to auscultation bilaterally.  No wheezes, no crackles, no rhonchi.  Discharge Instructions   Discharge Instructions    Diet - low sodium heart healthy   Complete by: As directed    Discharge instructions   Complete by: As directed    once daily irrigation of drain with 5 cc sterile saline, output monitoring and dressing changes every 1 to 2 days   Discharge wound care:   Complete by: As directed    Per IR for drain   Discharge wound care:   Complete by: As directed    As per d/c instructions.   Increase activity slowly   Complete by: As directed      Allergies as of 09/09/2020      Reactions   Azithromycin Other (See Comments)   Patient states that she had severe stomach cramps for a solid 4 hours.       Medication List    TAKE these medications   acetaminophen 325 MG tablet Commonly known as: TYLENOL Take 650 mg by mouth every 6 (six) hours as needed for mild pain or headache.   amoxicillin-clavulanate 875-125 MG tablet Commonly known as: Augmentin Take 1 tablet by mouth 2 (two) times daily for 4 days.   atorvastatin 40 MG tablet Commonly known as: LIPITOR TAKE 1 TABLET BY MOUTH EVERYDAY AT BEDTIME  cyanocobalamin 1000 MCG tablet Take 1,000 mcg by mouth daily.   Eliquis 5 MG Tabs tablet Generic drug: apixaban TAKE 1 TABLET BY MOUTH TWICE A DAY   hydrochlorothiazide 25 MG tablet Commonly known as: HYDRODIURIL Take 0.5 tablets (12.5 mg total) by mouth daily.   hydrocortisone cream 0.5 % Apply 1 application topically daily as needed for itching.   levothyroxine 125 MCG tablet Commonly known as: SYNTHROID Take 125 mcg by mouth daily. Every day except Sundays.   ondansetron 4 MG tablet Commonly known as: ZOFRAN Take 1 tablet (4 mg total) by mouth every 6 (six) hours as needed for nausea.   PROBIOTIC DAILY PO Take 1 tablet by mouth 2  (two) times daily.   Vitamin D 50 MCG (2000 UT) Caps Take 2,000 Units by mouth daily.            Discharge Care Instructions  (From admission, onward)         Start     Ordered   09/09/20 0000  Discharge wound care:       Comments: Per IR for drain   09/09/20 1302   09/09/20 0000  Discharge wound care:       Comments: As per d/c instructions.   09/09/20 1308         Allergies  Allergen Reactions  . Azithromycin Other (See Comments)    Patient states that she had severe stomach cramps for a solid 4 hours.     Follow-up Information    Stechschulte, Nickola Major, MD Follow up on 10/14/2020.   Specialty: Surgery Why: 11:45 am, arrive by 11:15am for paperwork and check in process Contact information: [address] [place] [address] [phone]        [facility] Follow up.   Specialty: [place] Why: This agency will provide nurse services. The agency will contact you prior to the first visit.  Contact information: [place] [address] [phone]        [facility] Follow up.   Why: F/U as scheduled. For questions concerning drain may call [phone] or [place], MD. Schedule an appointment as soon as possible for a visit in 1 week(s).   Specialty: Internal Medicine Contact information: [place] [address] [phone]        Martinique, Peter M, MD .   Specialty: Cardiology Contact information: [address] [place] [address] [phone]                The results of significant diagnostics from this hospitalization (including imaging, microbiology, ancillary and laboratory) are listed below for reference.    Significant Diagnostic Studies: CT Abdomen Pelvis W Contrast  Result Date: 09/05/2020 CLINICAL DATA:  Right lower quadrant abdominal pain over the last 5 days with diarrhea, nausea, vomiting, and fever.  EXAM: CT ABDOMEN AND PELVIS WITH CONTRAST TECHNIQUE: Multidetector CT imaging of the abdomen and pelvis was performed using the standard protocol following bolus administration of intravenous contrast. CONTRAST:  147mL OMNIPAQUE IOHEXOL 300 MG/ML  SOLN COMPARISON:  10/11/2013 FINDINGS: Lower chest: Dense mitral calcification. Pacer leads noted. Right coronary artery atherosclerotic calcification. Descending thoracic aortic atherosclerosis. Hepatobiliary: Striking wall thickening in the gallbladder with single wall mural thickness up to 1.8 cm. There is a 3.4 cm in long axis gallstone proximally in the gallbladder. The mild stranding in the pericholecystic tissues.  No significant biliary dilatation. No significant focal liver lesion identified. Pancreas: Unremarkable Spleen: Unremarkable Adrenals/Urinary Tract: Adrenal glands normal. Mild right hydronephrosis without hydroureter and without stone at the UPJ, query chronic mild UPJ narrowing. There is no significant asymmetry in contrast enhancement the kidneys. 0.7 cm hypodense lesion of the right kidney upper pole is likely a small cyst but technically too small to characterize. Stomach/Bowel: Sigmoid colon diverticulosis without active diverticulitis. No dilated bowel. Normal appendix. Vascular/Lymphatic: Aortoiliac atherosclerotic vascular disease. Reproductive: Uterus absent.  Adnexa unremarkable. Other: No supplemental non-categorized findings. 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 There is also grade 1 degenerative anterolisthesis at L4-5. Lumbar spondylosis and degenerative disc disease leading to multilevel foraminal impingement most notably on the left at L5-S1. IMPRESSION: 1. Striking wall thickening in the gallbladder with a 3.4 cm in long axis gallstone proximally in the gallbladder. Mild stranding in the pericholecystic tissues. Appearance favors acute cholecystitis, correlate with clinical presentation. No  CBD dilatation. 2. Other imaging findings of potential clinical significance: Coronary atherosclerosis. Dense mitral calcification. Sigmoid colon diverticulosis. 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 Lumbar spondylosis and degenerative disc disease leading to multilevel foraminal impingement most notably on the left at L5-S1. 3. Aortic atherosclerosis. Aortic Atherosclerosis (ICD10-I70.0). Electronically Signed   By: Gaylyn Rong M.D.   On: 09/05/2020 12:30   IR Perc Cholecystostomy  Result Date: 09/07/2020 INDICATION: Acute cholecystitis.  Postsurgical candidate. EXAM: Cholecystostomy drain placement MEDICATIONS: Zosyn; The antibiotic was administered within an appropriate time frame prior to the initiation of the procedure. ANESTHESIA/SEDATION: Moderate (conscious) sedation was employed during this procedure. A total of Versed 2 mg and Fentanyl 75 mcg was administered intravenously. Moderate Sedation Time: 10 minutes. The patient's level of consciousness and vital signs were monitored continuously by radiology nursing throughout the procedure under my direct supervision. FLUOROSCOPY TIME:  Fluoroscopy Time: 1 minutes 0 seconds (17 mGy). COMPLICATIONS: None immediate. PROCEDURE: Informed written consent was obtained from the patient after a thorough discussion of the procedural risks, benefits and alternatives. All questions were addressed. Maximal Sterile Barrier Technique was utilized including caps, mask, sterile gowns, sterile gloves, sterile drape, hand hygiene and skin antiseptic. A timeout was performed prior to the initiation of the procedure. Patient positioned supine on the angiography table. Right upper quadrant abdominal skin prepped and draped in the usual sterile fashion. Sterile probe cover and sterile gel utilized throughout the procedure. Using ultrasound and fluoroscoping guidance, the gallbladder was accessed was accessed with a 21 ga  needle. Contrast administered through the needle confirmed appropriate access within the gallbladder lumen. 0.018 inch guidewire advanced into the gallbladder lumen and the needle was exchanged for a transitional dilator set. Percutaneous contrast administration demonstrated appropriate access the gallbladder lumen. Filling defect consistent with gallstone. 10.2 fr multipurpose pigtail drain was inserted over the guidewire. The catheter tip was positioned in the gallbladder lumen. The catheter was secured to skin with suture and connected to bag. IMPRESSION: 10.2 French cholecystostomy drain placed utilizing ultrasound fluoroscopic guidance. Electronically Signed   By: Acquanetta Belling M.D.   On: 09/07/2020 11:12   CUP PACEART REMOTE DEVICE CHECK  Result Date: 09/02/2020 Scheduled remote reviewed. Normal device function.  Next remote 91 days. HB   Microbiology: Recent Results (from the past 240 hour(s))  Urine culture     Status: Abnormal   Collection Time: 09/05/20 11:38 AM   Specimen: Urine, Random  Result Value Ref Range Status  Specimen Description   Final    URINE, RANDOM Performed at [facility] [address] [place], [place] [address]    Special Requests   Final    NONE Performed at [facility], [address] [place], [place] [address]    Culture (A)  Final    <10,000 COLONIES/mL INSIGNIFICANT GROWTH Performed at [facility] [address], [place], [place] [address]    Report Status 09/06/2020 FINAL  Final  SARS Coronavirus 2 by RT PCR (hospital order, performed in [facility] hospital lab) Nasopharyngeal Nasopharyngeal Swab     Status: None   Collection Time: 09/05/20 12:35 PM   Specimen: Nasopharyngeal Swab  Result Value Ref Range Status   SARS Coronavirus 2 NEGATIVE NEGATIVE Final    Comment: (NOTE) SARS-CoV-2 target nucleic acids are NOT DETECTED.  The SARS-CoV-2 RNA is generally detectable in upper and lower respiratory specimens  during the acute phase of infection. The lowest concentration of SARS-CoV-2 viral copies this assay can detect is 250 copies / mL. A negative result does not preclude SARS-CoV-2 infection and should not be used as the sole basis for treatment or other patient management decisions.  A negative result may occur with improper specimen collection / handling, submission of specimen other than nasopharyngeal swab, presence of viral mutation(s) within the areas targeted by this assay, and inadequate number of viral copies (<250 copies / mL). A negative result must be combined with clinical observations, patient history, and epidemiological information.  Fact Sheet for Patients:   StrictlyIdeas.no  Fact Sheet for Healthcare Providers: BankingDealers.co.za  This test is not yet approved or  cleared by the [place] FDA and has been authorized for detection and/or diagnosis of SARS-CoV-2 by FDA under an Emergency Use Authorization (EUA).  This EUA will remain in effect (meaning this test can be used) for the duration of the COVID-19 declaration under Section 564(b)(1) of the Act, 21 U.S.C. section 360bbb-3(b)(1), unless the authorization is terminated or revoked sooner.  Performed at [facility], [address] [place], [place] [address]   Aerobic/Anaerobic Culture (surgical/deep wound)     Status: None (Preliminary result)   Collection Time: 09/07/20 10:30 AM   Specimen: Gallbladder; Bile  Result Value Ref Range Status   Specimen Description   Final    GALL BLADDER BILE Performed at [facility], [place] [address] [place], [place] [address]    Special Requests   Final    Normal Performed at [facility], [place] [address], [place], [place] [address]    Gram Stain   Final    FEW WBC PRESENT,BOTH PMN AND MONONUCLEAR NO ORGANISMS SEEN    Culture   Final    NO GROWTH 2 DAYS Performed at  [facility], [place] [address], [place], [place] [address]    Report Status PENDING  Incomplete     Labs: Basic Metabolic Panel: Recent Labs  Lab 09/05/20 1138 09/06/20 0550 09/07/20 0617 09/08/20 0520 09/09/20 0538  NA 130* 130* 133* 135 135  K 2.8* 3.9 4.0 3.8 3.3*  CL 95* 100 102 104 104  CO2 24 21* 22 21* 25  GLUCOSE 104* 92 95 94 98  BUN 16 15 11 10 8   CREATININE 0.96 0.87 0.90 0.92 0.94  CALCIUM 8.8* 8.4* 9.0 8.7* 8.4*  MG 1.7 2.1 2.0  --  1.8  PHOS  --  2.8 3.2  --  3.6  Liver Function Tests: Recent Labs  Lab 09/05/20 1138 09/06/20 0550  AST 33 33  ALT 18 18  ALKPHOS 95 88  BILITOT 1.9* 1.2  PROT 6.8 5.6*  ALBUMIN 3.3* 2.7*   No results for input(s): LIPASE, AMYLASE in the last 168 hours. No results for input(s): AMMONIA in the last 168 hours. CBC: Recent Labs  Lab 09/05/20 1138 09/06/20 0550 09/07/20 0617 09/08/20 0520 09/09/20 0538  WBC 14.7* 10.2 9.2 4.9 4.5  NEUTROABS 12.5*  --  6.6 2.3 2.1  HGB 13.4 11.1* 12.0 11.0* 11.3*  HCT 38.4 32.9* 36.3 33.0* 34.1*  MCV 92.8 95.6 95.8 96.2 97.4  PLT 326 299 333 331 363   Cardiac Enzymes: No results for input(s): CKTOTAL, CKMB, CKMBINDEX, TROPONINI in the last 168 hours. BNP: BNP (last 3 results) No results for input(s): BNP in the last 8760 hours.  ProBNP (last 3 results) No results for input(s): PROBNP in the last 8760 hours.  CBG: Recent Labs  Lab 09/06/20 0751  GLUCAP 103*       Signed:  Irine Seal MD.  [facility] 09/09/2020, 1:24 PM

## 2020-09-09 NOTE — Care Management Important Message (Signed)
Important Message  Patient Details IM Letter given to the Patient. Name: Tracey Mcclure MRN: [id number] Date of Birth: 1941-02-24   Medicare Important Message Given:  Yes     Kerin Salen 09/09/2020, 9:26 AM

## 2020-09-09 NOTE — Plan of Care (Signed)
Instructions were reviewed with patient. All questions were answered. Patient was transported to main entrance by wheelchair. ° °

## 2020-09-10 NOTE — Progress Notes (Signed)
Remote pacemaker transmission.   

## 2020-09-12 DIAGNOSIS — E7800 Pure hypercholesterolemia, unspecified: Secondary | ICD-10-CM | POA: Diagnosis not present

## 2020-09-12 DIAGNOSIS — I4892 Unspecified atrial flutter: Secondary | ICD-10-CM | POA: Diagnosis not present

## 2020-09-12 DIAGNOSIS — I4891 Unspecified atrial fibrillation: Secondary | ICD-10-CM | POA: Diagnosis not present

## 2020-09-12 DIAGNOSIS — I422 Other hypertrophic cardiomyopathy: Secondary | ICD-10-CM | POA: Diagnosis not present

## 2020-09-12 DIAGNOSIS — I119 Hypertensive heart disease without heart failure: Secondary | ICD-10-CM | POA: Diagnosis not present

## 2020-09-12 DIAGNOSIS — K8000 Calculus of gallbladder with acute cholecystitis without obstruction: Secondary | ICD-10-CM | POA: Diagnosis not present

## 2020-09-12 DIAGNOSIS — I051 Rheumatic mitral insufficiency: Secondary | ICD-10-CM | POA: Diagnosis not present

## 2020-09-12 DIAGNOSIS — Z950 Presence of cardiac pacemaker: Secondary | ICD-10-CM | POA: Diagnosis not present

## 2020-09-12 DIAGNOSIS — Z9181 History of falling: Secondary | ICD-10-CM | POA: Diagnosis not present

## 2020-09-12 DIAGNOSIS — E785 Hyperlipidemia, unspecified: Secondary | ICD-10-CM | POA: Diagnosis not present

## 2020-09-12 DIAGNOSIS — D509 Iron deficiency anemia, unspecified: Secondary | ICD-10-CM | POA: Diagnosis not present

## 2020-09-12 DIAGNOSIS — I495 Sick sinus syndrome: Secondary | ICD-10-CM | POA: Diagnosis not present

## 2020-09-12 DIAGNOSIS — Z4803 Encounter for change or removal of drains: Secondary | ICD-10-CM | POA: Diagnosis not present

## 2020-09-12 DIAGNOSIS — I441 Atrioventricular block, second degree: Secondary | ICD-10-CM | POA: Diagnosis not present

## 2020-09-12 DIAGNOSIS — E039 Hypothyroidism, unspecified: Secondary | ICD-10-CM | POA: Diagnosis not present

## 2020-09-12 DIAGNOSIS — K219 Gastro-esophageal reflux disease without esophagitis: Secondary | ICD-10-CM | POA: Diagnosis not present

## 2020-09-12 DIAGNOSIS — Z7901 Long term (current) use of anticoagulants: Secondary | ICD-10-CM | POA: Diagnosis not present

## 2020-09-12 DIAGNOSIS — D472 Monoclonal gammopathy: Secondary | ICD-10-CM | POA: Diagnosis not present

## 2020-09-12 LAB — AEROBIC/ANAEROBIC CULTURE W GRAM STAIN (SURGICAL/DEEP WOUND): Special Requests: NORMAL

## 2020-09-15 DIAGNOSIS — D472 Monoclonal gammopathy: Secondary | ICD-10-CM | POA: Diagnosis not present

## 2020-09-15 DIAGNOSIS — I4891 Unspecified atrial fibrillation: Secondary | ICD-10-CM | POA: Diagnosis not present

## 2020-09-15 DIAGNOSIS — E871 Hypo-osmolality and hyponatremia: Secondary | ICD-10-CM | POA: Diagnosis not present

## 2020-09-15 DIAGNOSIS — I4892 Unspecified atrial flutter: Secondary | ICD-10-CM | POA: Diagnosis not present

## 2020-09-15 DIAGNOSIS — I129 Hypertensive chronic kidney disease with stage 1 through stage 4 chronic kidney disease, or unspecified chronic kidney disease: Secondary | ICD-10-CM | POA: Diagnosis not present

## 2020-09-15 DIAGNOSIS — I119 Hypertensive heart disease without heart failure: Secondary | ICD-10-CM | POA: Diagnosis not present

## 2020-09-15 DIAGNOSIS — I422 Other hypertrophic cardiomyopathy: Secondary | ICD-10-CM | POA: Diagnosis not present

## 2020-09-15 DIAGNOSIS — I421 Obstructive hypertrophic cardiomyopathy: Secondary | ICD-10-CM | POA: Diagnosis not present

## 2020-09-15 DIAGNOSIS — I77810 Thoracic aortic ectasia: Secondary | ICD-10-CM | POA: Diagnosis not present

## 2020-09-15 DIAGNOSIS — E039 Hypothyroidism, unspecified: Secondary | ICD-10-CM | POA: Diagnosis not present

## 2020-09-15 DIAGNOSIS — K8000 Calculus of gallbladder with acute cholecystitis without obstruction: Secondary | ICD-10-CM | POA: Diagnosis not present

## 2020-09-15 DIAGNOSIS — Z4803 Encounter for change or removal of drains: Secondary | ICD-10-CM | POA: Diagnosis not present

## 2020-09-15 DIAGNOSIS — Z7901 Long term (current) use of anticoagulants: Secondary | ICD-10-CM | POA: Diagnosis not present

## 2020-09-15 DIAGNOSIS — Z7189 Other specified counseling: Secondary | ICD-10-CM | POA: Diagnosis not present

## 2020-09-15 DIAGNOSIS — N1831 Chronic kidney disease, stage 3a: Secondary | ICD-10-CM | POA: Diagnosis not present

## 2020-09-15 DIAGNOSIS — E876 Hypokalemia: Secondary | ICD-10-CM | POA: Diagnosis not present

## 2020-09-17 DIAGNOSIS — I119 Hypertensive heart disease without heart failure: Secondary | ICD-10-CM | POA: Diagnosis not present

## 2020-09-17 DIAGNOSIS — I422 Other hypertrophic cardiomyopathy: Secondary | ICD-10-CM | POA: Diagnosis not present

## 2020-09-17 DIAGNOSIS — I4891 Unspecified atrial fibrillation: Secondary | ICD-10-CM | POA: Diagnosis not present

## 2020-09-17 DIAGNOSIS — I4892 Unspecified atrial flutter: Secondary | ICD-10-CM | POA: Diagnosis not present

## 2020-09-17 DIAGNOSIS — Z4803 Encounter for change or removal of drains: Secondary | ICD-10-CM | POA: Diagnosis not present

## 2020-09-17 DIAGNOSIS — K8000 Calculus of gallbladder with acute cholecystitis without obstruction: Secondary | ICD-10-CM | POA: Diagnosis not present

## 2020-09-21 DIAGNOSIS — I4892 Unspecified atrial flutter: Secondary | ICD-10-CM | POA: Diagnosis not present

## 2020-09-21 DIAGNOSIS — I422 Other hypertrophic cardiomyopathy: Secondary | ICD-10-CM | POA: Diagnosis not present

## 2020-09-21 DIAGNOSIS — I119 Hypertensive heart disease without heart failure: Secondary | ICD-10-CM | POA: Diagnosis not present

## 2020-09-21 DIAGNOSIS — K8000 Calculus of gallbladder with acute cholecystitis without obstruction: Secondary | ICD-10-CM | POA: Diagnosis not present

## 2020-09-21 DIAGNOSIS — Z4803 Encounter for change or removal of drains: Secondary | ICD-10-CM | POA: Diagnosis not present

## 2020-09-21 DIAGNOSIS — I4891 Unspecified atrial fibrillation: Secondary | ICD-10-CM | POA: Diagnosis not present

## 2020-09-24 DIAGNOSIS — I422 Other hypertrophic cardiomyopathy: Secondary | ICD-10-CM | POA: Diagnosis not present

## 2020-09-24 DIAGNOSIS — Z4803 Encounter for change or removal of drains: Secondary | ICD-10-CM | POA: Diagnosis not present

## 2020-09-24 DIAGNOSIS — I119 Hypertensive heart disease without heart failure: Secondary | ICD-10-CM | POA: Diagnosis not present

## 2020-09-24 DIAGNOSIS — I4891 Unspecified atrial fibrillation: Secondary | ICD-10-CM | POA: Diagnosis not present

## 2020-09-24 DIAGNOSIS — K8000 Calculus of gallbladder with acute cholecystitis without obstruction: Secondary | ICD-10-CM | POA: Diagnosis not present

## 2020-09-24 DIAGNOSIS — I4892 Unspecified atrial flutter: Secondary | ICD-10-CM | POA: Diagnosis not present

## 2020-09-30 DIAGNOSIS — Z4803 Encounter for change or removal of drains: Secondary | ICD-10-CM | POA: Diagnosis not present

## 2020-09-30 DIAGNOSIS — K8000 Calculus of gallbladder with acute cholecystitis without obstruction: Secondary | ICD-10-CM | POA: Diagnosis not present

## 2020-09-30 DIAGNOSIS — I119 Hypertensive heart disease without heart failure: Secondary | ICD-10-CM | POA: Diagnosis not present

## 2020-09-30 DIAGNOSIS — I4891 Unspecified atrial fibrillation: Secondary | ICD-10-CM | POA: Diagnosis not present

## 2020-09-30 DIAGNOSIS — I4892 Unspecified atrial flutter: Secondary | ICD-10-CM | POA: Diagnosis not present

## 2020-09-30 DIAGNOSIS — I422 Other hypertrophic cardiomyopathy: Secondary | ICD-10-CM | POA: Diagnosis not present

## 2020-10-05 NOTE — Progress Notes (Signed)
Tracey Mcclure Date of Birth: June 07, 1941   History of Present Illness: Tracey Mcclure is seen today for followup of atrial fibrillation and bradycardia. She has a history of hypertrophic obstructive cardiomyopathy, hypertension, and Mobitz type I second-degree AV block. When seen in April 2015 she was noted to be in atrial fibrillation with a slow ventricular response of 53. She had only mild palpitations. Verapamil was discontinued. 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 Prior extensive GI work up for heme positive stool and iron deficiency anemia was unrevealing.   She had a Holter monitor in august 2016 that showed sinus rhythm with PACs and PVCs. Some intermittent second degree AV block. Mean HR 65 with slowest HR 31 and longest pause of 3.1 seconds. She was seen by Rosaria Ferries PA. At that time Ecg showed AFib with rate 43 bpm. She complained of some intermittent edema and fatigue.  She was seen by Dr. Lovena Le and underwent PPM placement in March 2017.  In January she presented with a lead fracture and had lead revision on 09/01/17.  Last pacemaker check in Jan was good.   On her last visit we stopped her metoprolol because her BP was low and she felt fatigued. She did note a significant improvement in her fatigue. BP readings from home looked very good thru September. Echo in October showed septal hypertrophy without outflow gradient. Moderate MR.  She was admitted at the end of January with acute cholecystitis. Had IR drain placed. HCTZ initially held due to hyponatremia but this improved and it was resumed at DC. She is scheduled to have drain removed next week and then follow up with surgeon. Notes appetite is off. No abdominal pain. No dyspnea or chest pain. Bp readings at home have been well controlled.  Current Outpatient Medications on File Prior to Visit  Medication Sig Dispense Refill   acetaminophen (TYLENOL) 325 MG tablet Take 650 mg by mouth every 6 (six) hours as needed for  mild pain or headache.      atorvastatin (LIPITOR) 40 MG tablet TAKE 1 TABLET BY MOUTH EVERYDAY AT BEDTIME 90 tablet 3   Cholecalciferol (VITAMIN D) 2000 units CAPS Take 2,000 Units by mouth daily.      cyanocobalamin 1000 MCG tablet Take 1,000 mcg by mouth daily.     ELIQUIS 5 MG TABS tablet TAKE 1 TABLET BY MOUTH TWICE A DAY 180 tablet 1   hydrochlorothiazide (HYDRODIURIL) 25 MG tablet Take 0.5 tablets (12.5 mg total) by mouth daily. 90 tablet 2   hydrocortisone cream 0.5 % Apply 1 application topically daily as needed for itching.     levothyroxine (SYNTHROID, LEVOTHROID) 125 MCG tablet Take 125 mcg by mouth daily. Half a tab on Sundays.     ondansetron (ZOFRAN) 4 MG tablet Take 1 tablet (4 mg total) by mouth every 6 (six) hours as needed for nausea. 20 tablet 0   Probiotic Product (PROBIOTIC DAILY PO) Take 1 tablet by mouth 2 (two) times daily.     No current facility-administered medications on file prior to visit.    Allergies  Allergen Reactions   Azithromycin Other (See Comments)    Patient states that she had severe stomach cramps for a solid 4 hours.     Past Medical History:  Diagnosis Date   Atrial fibrillation ([place]) 12/05/2013   Colon adenoma    Gastric mass 10/14/2013   GERD (gastroesophageal reflux disease)    HTN (hypertension)    CONTROLLED   Hypercholesteremia  Hypertr obst cardiomyop    Hypothyroidism    Iron deficiency anemia, unspecified 10/14/2013   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   Mitral insufficiency    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   Obesity    Palpitations     Past Surgical History:  Procedure Laterality Date   ABDOMINAL HYSTERECTOMY     EP IMPLANTABLE DEVICE N/A 10/26/2015   Procedure: Pacemaker Implant;  Surgeon: Evans Lance, MD;  Location: [facility];  Service: Cardiovascular;  Laterality: N/A;   IR PERC CHOLECYSTOSTOMY  09/07/2020   OVARY SURGERY      BENIGN TUMOR   PACEMAKER REVISION N/A 09/01/2017   Procedure: PACEMAKER REVISION;  Surgeon: Constance Haw, MD;  Location: [facility];  Service: Cardiovascular;  Laterality: N/A;    Social History   Tobacco Use  Smoking Status Never Smoker  Smokeless Tobacco Never Used    Social History   Substance and Sexual Activity  Alcohol Use No    Family History  Problem Relation Age of Onset   Heart attack Brother 49   Prostate cancer Brother    Breast cancer Sister 68    Review of Systems: As noted in history of present illness.All other systems were reviewed and are negative.  Physical Exam: BP (!) 150/83    Pulse 72    Ht 5\' 4"  (1.626 m)    Wt 170 lb 6.4 oz (77.3 kg)    LMP  (LMP Unknown)    SpO2 97%    BMI 29.25 kg/m  GENERAL:  Well appearing overweight WF in NAD HEENT:  PERRL, EOMI, sclera are clear. Oropharynx is clear. NECK:  No jugular venous distention, carotid upstroke brisk and symmetric, no bruits, no thyromegaly or adenopathy LUNGS:  Clear to auscultation bilaterally CHEST:  Unremarkable. Pacer site looks OK.  HEART:  RRR,  PMI not displaced or sustained,S1 and S2 within normal limits, no S3, no S4: no clicks, no rubs, gr 2/6 systolic murmur across the precordium.  ABD:  Soft, nontender. BS +, no masses or bruits. No hepatomegaly, no splenomegaly EXT:  2 + pulses throughout, no edema, compression hose on. no cyanosis no clubbing SKIN:  Warm and dry.  No rashes NEURO:  Alert and oriented x 3. Cranial nerves II through XII intact. PSYCH:  Cognitively intact    LABORATORY DATA: Lab Results  Component Value Date   WBC 4.5 09/09/2020   HGB 11.3 (L) 09/09/2020   HCT 34.1 (L) 09/09/2020   PLT 363 09/09/2020   GLUCOSE 98 09/09/2020   ALT 18 09/06/2020   AST 33 09/06/2020   NA 135 09/09/2020   K 3.3 (L) 09/09/2020   CL 104 09/09/2020   CREATININE 0.94 09/09/2020   BUN 8 09/09/2020   CO2 25 09/09/2020   TSH 12.372 (H) 09/06/2020   Labs dated  11/30/15: cholesterol 165, triglycerides 97, LDL 76, HDL 70. BMET and TSH normal.  Dated 12/19/16: cholesterol 148, triglycerides 90, HDL 65, LDL 65. BMET and TSH normal. A1c 5.5%.  Dated 01/29/18: cholesterol 148, triglycerides 99, HDL 65, LDL 63. Dated 03/11/19: cholesterol 148, triglycerides 104, HDL 63, LDL 64, A1c 5.3%. CMET, CBC, TSH normal. Dated 04/07/20: cholesterol 151,triglycerides 116, HDL 66, LDL 62. A1c 5.2%. CBC, TFTs, CMET normal.  Echo: 05/18/20: IMPRESSIONS    1. Left ventricular ejection fraction, by estimation, is 65 to 70%. The  left ventricle has normal function. The left ventricle has no regional  wall motion  abnormalities. There is severe asymmetric left ventricular  hypertrophy of the basal-septal  segment. No significant LVOT gradient at rest. Left ventricular diastolic  parameters are indeterminate.  2. Right ventricular systolic function is normal. The right ventricular  size is normal. There is mildly elevated pulmonary artery systolic  pressure. The estimated right ventricular systolic pressure is 62.8 mmHg.  3. Left atrial size was severely dilated.  4. The mitral valve is abnormal. Moderate mitral valve regurgitation. No  evidence of mitral stenosis. Severe mitral annular calcification.  5. Tricuspid valve regurgitation is moderate.  6. The aortic valve is tricuspid. Aortic valve regurgitation is trivial.  No aortic stenosis is present.  7. Aortic dilatation noted. There is dilatation of the ascending aorta,  measuring 43 mm.  8. The inferior vena cava is normal in size with greater than 50%  respiratory variabi  Assessment / Plan: 1. Atrial fibrillation- paroxysmal- with slow ventricular response.  History of sinus bradycardia with second-degree Mobitz type I AV block.  CHAD-Vasc score of 3-4. On Eliquis for anticoagulation. s/p PPM- revised in January 2019 for lead fracture. Functioning well now. HR is controlled on no AV nodal blocking agents.   2.  Hypertrophic cardiomyopathy. Echocardiogram done  October without outflow gradeient. Normal EF.   Will continue HCTZ.  Edema well controlled with support hose. Instructed on sodium restricted diet. Off Toprol due to fatigue and low BP.   3. Mitral insufficiency-mod  related to her hypertrophic cardiomyopathy.   4. Hypertension- BP is elevated today but  doing very well at home. Will monitor for now.  5. MGUS- followed yearly by hematology. No change.  6. Acute cholecystitis. S/p perc drain. Anticipate Surgery for definitive therapy. Acceptable surgical risk from a cardiac standpoint. Would need to hold Eliquis 48 hours prior.

## 2020-10-07 DIAGNOSIS — I422 Other hypertrophic cardiomyopathy: Secondary | ICD-10-CM | POA: Diagnosis not present

## 2020-10-07 DIAGNOSIS — Z4803 Encounter for change or removal of drains: Secondary | ICD-10-CM | POA: Diagnosis not present

## 2020-10-07 DIAGNOSIS — I4891 Unspecified atrial fibrillation: Secondary | ICD-10-CM | POA: Diagnosis not present

## 2020-10-07 DIAGNOSIS — I119 Hypertensive heart disease without heart failure: Secondary | ICD-10-CM | POA: Diagnosis not present

## 2020-10-07 DIAGNOSIS — I4892 Unspecified atrial flutter: Secondary | ICD-10-CM | POA: Diagnosis not present

## 2020-10-07 DIAGNOSIS — K8000 Calculus of gallbladder with acute cholecystitis without obstruction: Secondary | ICD-10-CM | POA: Diagnosis not present

## 2020-10-08 ENCOUNTER — Encounter: Payer: Self-pay | Admitting: Cardiology

## 2020-10-08 ENCOUNTER — Other Ambulatory Visit: Payer: Self-pay

## 2020-10-08 ENCOUNTER — Ambulatory Visit (INDEPENDENT_AMBULATORY_CARE_PROVIDER_SITE_OTHER): Payer: Medicare Other | Admitting: Cardiology

## 2020-10-08 VITALS — BP 150/83 | HR 72 | Ht 64.0 in | Wt 170.4 lb

## 2020-10-08 DIAGNOSIS — I441 Atrioventricular block, second degree: Secondary | ICD-10-CM | POA: Diagnosis not present

## 2020-10-08 DIAGNOSIS — I482 Chronic atrial fibrillation, unspecified: Secondary | ICD-10-CM | POA: Diagnosis not present

## 2020-10-08 DIAGNOSIS — I421 Obstructive hypertrophic cardiomyopathy: Secondary | ICD-10-CM | POA: Diagnosis not present

## 2020-10-08 DIAGNOSIS — I340 Nonrheumatic mitral (valve) insufficiency: Secondary | ICD-10-CM

## 2020-10-08 DIAGNOSIS — I7789 Other specified disorders of arteries and arterioles: Secondary | ICD-10-CM | POA: Diagnosis not present

## 2020-10-12 ENCOUNTER — Ambulatory Visit (HOSPITAL_COMMUNITY)
Admission: RE | Admit: 2020-10-12 | Discharge: 2020-10-12 | Disposition: A | Discharge status: Home / Self Care | Payer: Medicare Other | Source: Ambulatory Visit | Attending: Radiology | Admitting: Radiology

## 2020-10-12 ENCOUNTER — Other Ambulatory Visit: Payer: Self-pay

## 2020-10-12 ENCOUNTER — Other Ambulatory Visit (HOSPITAL_COMMUNITY): Payer: Self-pay | Admitting: Radiology

## 2020-10-12 DIAGNOSIS — I4892 Unspecified atrial flutter: Secondary | ICD-10-CM | POA: Diagnosis not present

## 2020-10-12 DIAGNOSIS — R7401 Elevation of levels of liver transaminase levels: Secondary | ICD-10-CM | POA: Diagnosis not present

## 2020-10-12 DIAGNOSIS — I422 Other hypertrophic cardiomyopathy: Secondary | ICD-10-CM | POA: Diagnosis not present

## 2020-10-12 DIAGNOSIS — D472 Monoclonal gammopathy: Secondary | ICD-10-CM | POA: Diagnosis not present

## 2020-10-12 DIAGNOSIS — R1011 Right upper quadrant pain: Secondary | ICD-10-CM | POA: Diagnosis not present

## 2020-10-12 DIAGNOSIS — Z950 Presence of cardiac pacemaker: Secondary | ICD-10-CM | POA: Diagnosis not present

## 2020-10-12 DIAGNOSIS — Z7901 Long term (current) use of anticoagulants: Secondary | ICD-10-CM | POA: Diagnosis not present

## 2020-10-12 DIAGNOSIS — D509 Iron deficiency anemia, unspecified: Secondary | ICD-10-CM | POA: Diagnosis not present

## 2020-10-12 DIAGNOSIS — I495 Sick sinus syndrome: Secondary | ICD-10-CM | POA: Diagnosis not present

## 2020-10-12 DIAGNOSIS — Z20822 Contact with and (suspected) exposure to covid-19: Secondary | ICD-10-CM | POA: Diagnosis not present

## 2020-10-12 DIAGNOSIS — I119 Hypertensive heart disease without heart failure: Secondary | ICD-10-CM | POA: Diagnosis not present

## 2020-10-12 DIAGNOSIS — Z4803 Encounter for change or removal of drains: Secondary | ICD-10-CM | POA: Diagnosis not present

## 2020-10-12 DIAGNOSIS — R112 Nausea with vomiting, unspecified: Secondary | ICD-10-CM | POA: Diagnosis not present

## 2020-10-12 DIAGNOSIS — K819 Cholecystitis, unspecified: Secondary | ICD-10-CM

## 2020-10-12 DIAGNOSIS — I4891 Unspecified atrial fibrillation: Secondary | ICD-10-CM | POA: Diagnosis not present

## 2020-10-12 DIAGNOSIS — R9431 Abnormal electrocardiogram [ECG] [EKG]: Secondary | ICD-10-CM | POA: Diagnosis not present

## 2020-10-12 DIAGNOSIS — E785 Hyperlipidemia, unspecified: Secondary | ICD-10-CM | POA: Diagnosis not present

## 2020-10-12 DIAGNOSIS — K8020 Calculus of gallbladder without cholecystitis without obstruction: Secondary | ICD-10-CM | POA: Diagnosis not present

## 2020-10-12 DIAGNOSIS — Z9181 History of falling: Secondary | ICD-10-CM | POA: Diagnosis not present

## 2020-10-12 DIAGNOSIS — E039 Hypothyroidism, unspecified: Secondary | ICD-10-CM | POA: Diagnosis not present

## 2020-10-12 DIAGNOSIS — I051 Rheumatic mitral insufficiency: Secondary | ICD-10-CM | POA: Diagnosis not present

## 2020-10-12 DIAGNOSIS — K8018 Calculus of gallbladder with other cholecystitis without obstruction: Secondary | ICD-10-CM | POA: Insufficient documentation

## 2020-10-12 DIAGNOSIS — Z66 Do not resuscitate: Secondary | ICD-10-CM | POA: Diagnosis not present

## 2020-10-12 DIAGNOSIS — K219 Gastro-esophageal reflux disease without esophagitis: Secondary | ICD-10-CM | POA: Diagnosis not present

## 2020-10-12 DIAGNOSIS — I441 Atrioventricular block, second degree: Secondary | ICD-10-CM | POA: Diagnosis not present

## 2020-10-12 DIAGNOSIS — I4821 Permanent atrial fibrillation: Secondary | ICD-10-CM | POA: Diagnosis not present

## 2020-10-12 DIAGNOSIS — K8050 Calculus of bile duct without cholangitis or cholecystitis without obstruction: Secondary | ICD-10-CM | POA: Diagnosis not present

## 2020-10-12 DIAGNOSIS — K8067 Calculus of gallbladder and bile duct with acute and chronic cholecystitis with obstruction: Secondary | ICD-10-CM | POA: Diagnosis not present

## 2020-10-12 DIAGNOSIS — E7800 Pure hypercholesterolemia, unspecified: Secondary | ICD-10-CM | POA: Diagnosis not present

## 2020-10-12 DIAGNOSIS — K8021 Calculus of gallbladder without cholecystitis with obstruction: Secondary | ICD-10-CM | POA: Diagnosis not present

## 2020-10-12 DIAGNOSIS — Z0389 Encounter for observation for other suspected diseases and conditions ruled out: Secondary | ICD-10-CM | POA: Diagnosis not present

## 2020-10-12 DIAGNOSIS — E871 Hypo-osmolality and hyponatremia: Secondary | ICD-10-CM | POA: Diagnosis not present

## 2020-10-12 DIAGNOSIS — K8000 Calculus of gallbladder with acute cholecystitis without obstruction: Secondary | ICD-10-CM | POA: Diagnosis not present

## 2020-10-12 DIAGNOSIS — I421 Obstructive hypertrophic cardiomyopathy: Secondary | ICD-10-CM | POA: Diagnosis not present

## 2020-10-12 HISTORY — PX: IR CHOLANGIOGRAM EXISTING TUBE: IMG6040

## 2020-10-12 MED ORDER — LIDOCAINE HCL 1 % IJ SOLN
INTRAMUSCULAR | Status: AC
  Administered 2020-10-12: 10 mL
  Filled 2020-10-12: qty 20

## 2020-10-12 MED ORDER — IOHEXOL 300 MG/ML  SOLN
50.0000 mL | Freq: Once | INTRAMUSCULAR | Status: AC | PRN
  Administered 2020-10-12: 30 mL

## 2020-10-12 NOTE — Procedures (Signed)
  Procedure: Cholangiogram, Attempted GB drain exchange lost access EBL:   minimal Complications:  none immediate  See full dictation in [facility].  Dillard Cannon MD Main # [phone] Pager  [phone] Mobile [phone]

## 2020-10-13 ENCOUNTER — Emergency Department (HOSPITAL_COMMUNITY): Payer: Medicare Other

## 2020-10-13 ENCOUNTER — Encounter (HOSPITAL_COMMUNITY): Payer: Self-pay | Admitting: Emergency Medicine

## 2020-10-13 ENCOUNTER — Other Ambulatory Visit: Payer: Self-pay

## 2020-10-13 ENCOUNTER — Inpatient Hospital Stay (HOSPITAL_COMMUNITY)
Admission: EM | Admit: 2020-10-13 | Discharge: 2020-10-24 | DRG: 409 | Disposition: A | Discharge status: Home / Self Care | Payer: Medicare Other | Attending: Family Medicine | Admitting: Family Medicine

## 2020-10-13 DIAGNOSIS — R945 Abnormal results of liver function studies: Secondary | ICD-10-CM

## 2020-10-13 DIAGNOSIS — E785 Hyperlipidemia, unspecified: Secondary | ICD-10-CM | POA: Diagnosis present

## 2020-10-13 DIAGNOSIS — R5381 Other malaise: Secondary | ICD-10-CM | POA: Diagnosis present

## 2020-10-13 DIAGNOSIS — R7401 Elevation of levels of liver transaminase levels: Secondary | ICD-10-CM | POA: Diagnosis not present

## 2020-10-13 DIAGNOSIS — K219 Gastro-esophageal reflux disease without esophagitis: Secondary | ICD-10-CM | POA: Diagnosis present

## 2020-10-13 DIAGNOSIS — Z66 Do not resuscitate: Secondary | ICD-10-CM | POA: Diagnosis present

## 2020-10-13 DIAGNOSIS — K810 Acute cholecystitis: Secondary | ICD-10-CM | POA: Diagnosis not present

## 2020-10-13 DIAGNOSIS — Z888 Allergy status to other drugs, medicaments and biological substances status: Secondary | ICD-10-CM

## 2020-10-13 DIAGNOSIS — D472 Monoclonal gammopathy: Secondary | ICD-10-CM | POA: Diagnosis present

## 2020-10-13 DIAGNOSIS — K838 Other specified diseases of biliary tract: Secondary | ICD-10-CM

## 2020-10-13 DIAGNOSIS — I4891 Unspecified atrial fibrillation: Secondary | ICD-10-CM | POA: Diagnosis present

## 2020-10-13 DIAGNOSIS — E8809 Other disorders of plasma-protein metabolism, not elsewhere classified: Secondary | ICD-10-CM | POA: Diagnosis present

## 2020-10-13 DIAGNOSIS — R9431 Abnormal electrocardiogram [ECG] [EKG]: Secondary | ICD-10-CM | POA: Diagnosis not present

## 2020-10-13 DIAGNOSIS — N99512 Cystostomy malfunction: Secondary | ICD-10-CM | POA: Diagnosis present

## 2020-10-13 DIAGNOSIS — E039 Hypothyroidism, unspecified: Secondary | ICD-10-CM | POA: Diagnosis present

## 2020-10-13 DIAGNOSIS — Z950 Presence of cardiac pacemaker: Secondary | ICD-10-CM | POA: Diagnosis present

## 2020-10-13 DIAGNOSIS — E871 Hypo-osmolality and hyponatremia: Secondary | ICD-10-CM | POA: Diagnosis present

## 2020-10-13 DIAGNOSIS — K719 Toxic liver disease, unspecified: Secondary | ICD-10-CM | POA: Diagnosis present

## 2020-10-13 DIAGNOSIS — R1084 Generalized abdominal pain: Secondary | ICD-10-CM | POA: Diagnosis not present

## 2020-10-13 DIAGNOSIS — M25519 Pain in unspecified shoulder: Secondary | ICD-10-CM

## 2020-10-13 DIAGNOSIS — K8050 Calculus of bile duct without cholangitis or cholecystitis without obstruction: Secondary | ICD-10-CM | POA: Diagnosis present

## 2020-10-13 DIAGNOSIS — Z0389 Encounter for observation for other suspected diseases and conditions ruled out: Secondary | ICD-10-CM | POA: Diagnosis not present

## 2020-10-13 DIAGNOSIS — R1011 Right upper quadrant pain: Secondary | ICD-10-CM | POA: Diagnosis not present

## 2020-10-13 DIAGNOSIS — D75839 Thrombocytosis, unspecified: Secondary | ICD-10-CM | POA: Diagnosis present

## 2020-10-13 DIAGNOSIS — Z803 Family history of malignant neoplasm of breast: Secondary | ICD-10-CM

## 2020-10-13 DIAGNOSIS — I340 Nonrheumatic mitral (valve) insufficiency: Secondary | ICD-10-CM | POA: Diagnosis present

## 2020-10-13 DIAGNOSIS — R413 Other amnesia: Secondary | ICD-10-CM

## 2020-10-13 DIAGNOSIS — Y838 Other surgical procedures as the cause of abnormal reaction of the patient, or of later complication, without mention of misadventure at the time of the procedure: Secondary | ICD-10-CM | POA: Diagnosis not present

## 2020-10-13 DIAGNOSIS — T361X5A Adverse effect of cephalosporins and other beta-lactam antibiotics, initial encounter: Secondary | ICD-10-CM | POA: Diagnosis present

## 2020-10-13 DIAGNOSIS — M47812 Spondylosis without myelopathy or radiculopathy, cervical region: Secondary | ICD-10-CM | POA: Diagnosis present

## 2020-10-13 DIAGNOSIS — R112 Nausea with vomiting, unspecified: Secondary | ICD-10-CM

## 2020-10-13 DIAGNOSIS — I4821 Permanent atrial fibrillation: Secondary | ICD-10-CM | POA: Diagnosis present

## 2020-10-13 DIAGNOSIS — I422 Other hypertrophic cardiomyopathy: Secondary | ICD-10-CM | POA: Diagnosis present

## 2020-10-13 DIAGNOSIS — Z7901 Long term (current) use of anticoagulants: Secondary | ICD-10-CM

## 2020-10-13 DIAGNOSIS — R0781 Pleurodynia: Secondary | ICD-10-CM | POA: Diagnosis not present

## 2020-10-13 DIAGNOSIS — Z8042 Family history of malignant neoplasm of prostate: Secondary | ICD-10-CM

## 2020-10-13 DIAGNOSIS — T83020A Displacement of cystostomy catheter, initial encounter: Secondary | ICD-10-CM | POA: Diagnosis present

## 2020-10-13 DIAGNOSIS — Z538 Procedure and treatment not carried out for other reasons: Secondary | ICD-10-CM | POA: Diagnosis not present

## 2020-10-13 DIAGNOSIS — I441 Atrioventricular block, second degree: Secondary | ICD-10-CM | POA: Diagnosis present

## 2020-10-13 DIAGNOSIS — I495 Sick sinus syndrome: Secondary | ICD-10-CM | POA: Diagnosis present

## 2020-10-13 DIAGNOSIS — Z4659 Encounter for fitting and adjustment of other gastrointestinal appliance and device: Secondary | ICD-10-CM

## 2020-10-13 DIAGNOSIS — Z8249 Family history of ischemic heart disease and other diseases of the circulatory system: Secondary | ICD-10-CM

## 2020-10-13 DIAGNOSIS — I252 Old myocardial infarction: Secondary | ICD-10-CM

## 2020-10-13 DIAGNOSIS — K8000 Calculus of gallbladder with acute cholecystitis without obstruction: Secondary | ICD-10-CM | POA: Diagnosis not present

## 2020-10-13 DIAGNOSIS — E876 Hypokalemia: Secondary | ICD-10-CM | POA: Diagnosis present

## 2020-10-13 DIAGNOSIS — Z9689 Presence of other specified functional implants: Secondary | ICD-10-CM | POA: Diagnosis present

## 2020-10-13 DIAGNOSIS — Z79890 Hormone replacement therapy (postmenopausal): Secondary | ICD-10-CM

## 2020-10-13 DIAGNOSIS — I10 Essential (primary) hypertension: Secondary | ICD-10-CM | POA: Diagnosis present

## 2020-10-13 DIAGNOSIS — N736 Female pelvic peritoneal adhesions (postinfective): Secondary | ICD-10-CM | POA: Diagnosis present

## 2020-10-13 DIAGNOSIS — K8020 Calculus of gallbladder without cholecystitis without obstruction: Secondary | ICD-10-CM | POA: Diagnosis not present

## 2020-10-13 DIAGNOSIS — I421 Obstructive hypertrophic cardiomyopathy: Secondary | ICD-10-CM | POA: Diagnosis not present

## 2020-10-13 DIAGNOSIS — M25512 Pain in left shoulder: Secondary | ICD-10-CM | POA: Diagnosis not present

## 2020-10-13 DIAGNOSIS — K915 Postcholecystectomy syndrome: Secondary | ICD-10-CM | POA: Diagnosis not present

## 2020-10-13 DIAGNOSIS — R109 Unspecified abdominal pain: Secondary | ICD-10-CM

## 2020-10-13 DIAGNOSIS — E669 Obesity, unspecified: Secondary | ICD-10-CM | POA: Diagnosis present

## 2020-10-13 DIAGNOSIS — Z79899 Other long term (current) drug therapy: Secondary | ICD-10-CM

## 2020-10-13 DIAGNOSIS — Z419 Encounter for procedure for purposes other than remedying health state, unspecified: Secondary | ICD-10-CM

## 2020-10-13 DIAGNOSIS — R7989 Other specified abnormal findings of blood chemistry: Secondary | ICD-10-CM | POA: Diagnosis present

## 2020-10-13 DIAGNOSIS — Z881 Allergy status to other antibiotic agents status: Secondary | ICD-10-CM

## 2020-10-13 DIAGNOSIS — Z20822 Contact with and (suspected) exposure to covid-19: Secondary | ICD-10-CM | POA: Diagnosis present

## 2020-10-13 DIAGNOSIS — K8067 Calculus of gallbladder and bile duct with acute and chronic cholecystitis with obstruction: Principal | ICD-10-CM | POA: Diagnosis present

## 2020-10-13 LAB — COMPREHENSIVE METABOLIC PANEL
ALT: 150 U/L — ABNORMAL HIGH (ref 0–44)
AST: 403 U/L — ABNORMAL HIGH (ref 15–41)
Albumin: 3.1 g/dL — ABNORMAL LOW (ref 3.5–5.0)
Alkaline Phosphatase: 211 U/L — ABNORMAL HIGH (ref 38–126)
Anion gap: 9 (ref 5–15)
BUN: 9 mg/dL (ref 8–23)
CO2: 23 mmol/L (ref 22–32)
Calcium: 8.8 mg/dL — ABNORMAL LOW (ref 8.9–10.3)
Chloride: 98 mmol/L (ref 98–111)
Creatinine, Ser: 0.65 mg/dL (ref 0.44–1.00)
GFR, Estimated: >60 mL/min (ref >60)
Glucose, Bld: 129 mg/dL — ABNORMAL HIGH (ref 70–99)
Potassium: 3 mmol/L — ABNORMAL LOW (ref 3.5–5.1)
Sodium: 130 mmol/L — ABNORMAL LOW (ref 135–145)
Total Bilirubin: 2.6 mg/dL — ABNORMAL HIGH (ref 0.3–1.2)
Total Protein: 6.1 g/dL — ABNORMAL LOW (ref 6.5–8.1)

## 2020-10-13 LAB — CBC WITH DIFFERENTIAL/PLATELET
Abs Immature Granulocytes: 0.02 10*3/uL (ref 0.00–0.07)
Basophils Absolute: 0 10*3/uL (ref 0.0–0.1)
Basophils Relative: 0 %
Eosinophils Absolute: 0 10*3/uL (ref 0.0–0.5)
Eosinophils Relative: 1 %
HCT: 32.6 % — ABNORMAL LOW (ref 36.0–46.0)
Hemoglobin: 11.3 g/dL — ABNORMAL LOW (ref 12.0–15.0)
Immature Granulocytes: 0 %
Lymphocytes Relative: 15 %
Lymphs Abs: 1.2 10*3/uL (ref 0.7–4.0)
MCH: 31.9 pg (ref 26.0–34.0)
MCHC: 34.7 g/dL (ref 30.0–36.0)
MCV: 92.1 fL (ref 80.0–100.0)
Monocytes Absolute: 0.6 10*3/uL (ref 0.1–1.0)
Monocytes Relative: 7 %
Neutro Abs: 6.1 10*3/uL (ref 1.7–7.7)
Neutrophils Relative %: 77 %
Platelets: 289 10*3/uL (ref 150–400)
RBC: 3.54 MIL/uL — ABNORMAL LOW (ref 3.87–5.11)
RDW: 12.7 % (ref 11.5–15.5)
WBC: 8 10*3/uL (ref 4.0–10.5)
nRBC: 0 % (ref 0.0–0.2)

## 2020-10-13 LAB — LIPASE, BLOOD: Lipase: 23 U/L (ref 11–51)

## 2020-10-13 LAB — URINALYSIS, ROUTINE W REFLEX MICROSCOPIC
Bacteria, UA: NONE SEEN
Bilirubin Urine: NEGATIVE
Glucose, UA: NEGATIVE mg/dL
Ketones, ur: NEGATIVE mg/dL
Nitrite: NEGATIVE
Protein, ur: NEGATIVE mg/dL
Specific Gravity, Urine: 1.01 (ref 1.005–1.030)
pH: 6 (ref 5.0–8.0)

## 2020-10-13 LAB — PROTIME-INR
INR: 1.6 — ABNORMAL HIGH (ref 0.8–1.2)
Prothrombin Time: 18.9 seconds — ABNORMAL HIGH (ref 11.4–15.2)

## 2020-10-13 LAB — APTT: aPTT: 42 seconds — ABNORMAL HIGH (ref 24–36)

## 2020-10-13 LAB — POTASSIUM: Potassium: 4 mmol/L (ref 3.5–5.1)

## 2020-10-13 LAB — LACTIC ACID, PLASMA
Lactic Acid, Venous: 0.9 mmol/L (ref 0.5–1.9)
Lactic Acid, Venous: 1 mmol/L (ref 0.5–1.9)

## 2020-10-13 LAB — SARS CORONAVIRUS 2 (TAT 6-24 HRS): SARS Coronavirus 2: NEGATIVE

## 2020-10-13 MED ORDER — MORPHINE SULFATE (PF) 4 MG/ML IV SOLN
4.0000 mg | Freq: Once | INTRAVENOUS | Status: AC
Start: 2020-10-13 — End: 2020-10-13
  Administered 2020-10-13: 4 mg via INTRAVENOUS
  Filled 2020-10-13: qty 1

## 2020-10-13 MED ORDER — POTASSIUM CHLORIDE 10 MEQ/100ML IV SOLN
10.0000 meq | INTRAVENOUS | Status: AC
  Administered 2020-10-13 (×4): 10 meq via INTRAVENOUS
  Filled 2020-10-13 (×4): qty 100

## 2020-10-13 MED ORDER — ONDANSETRON HCL 4 MG/2ML IJ SOLN
4.0000 mg | Freq: Four times a day (QID) | INTRAMUSCULAR | Status: DC | PRN
  Administered 2020-10-13 – 2020-10-16 (×2): 4 mg via INTRAVENOUS
  Filled 2020-10-13 (×2): qty 2

## 2020-10-13 MED ORDER — OXYCODONE HCL 5 MG PO TABS: 2.5000 mg | ORAL_TABLET | ORAL | Status: DC | PRN

## 2020-10-13 MED ORDER — LEVOTHYROXINE SODIUM 125 MCG PO TABS: 62.5000 ug | ORAL_TABLET | ORAL | Status: DC

## 2020-10-13 MED ORDER — PROCHLORPERAZINE EDISYLATE 10 MG/2ML IJ SOLN
10.0000 mg | Freq: Once | INTRAMUSCULAR | Status: AC
  Administered 2020-10-13: 10 mg via INTRAVENOUS
  Filled 2020-10-13: qty 2

## 2020-10-13 MED ORDER — IOHEXOL 300 MG/ML  SOLN
100.0000 mL | Freq: Once | INTRAMUSCULAR | Status: AC | PRN
  Administered 2020-10-13: 100 mL via INTRAVENOUS

## 2020-10-13 MED ORDER — ONDANSETRON HCL 4 MG/2ML IJ SOLN
4.0000 mg | Freq: Once | INTRAMUSCULAR | Status: AC
  Administered 2020-10-13: 4 mg via INTRAVENOUS
  Filled 2020-10-13: qty 2

## 2020-10-13 MED ORDER — LACTATED RINGERS IV BOLUS
500.0000 mL | Freq: Once | INTRAVENOUS | Status: AC
  Administered 2020-10-13: 500 mL via INTRAVENOUS

## 2020-10-13 MED ORDER — POTASSIUM CHLORIDE IN NACL 20-0.9 MEQ/L-% IV SOLN
INTRAVENOUS | Status: DC
  Filled 2020-10-13 (×3): qty 1000

## 2020-10-13 MED ORDER — LEVOTHYROXINE SODIUM 125 MCG PO TABS
62.5000 ug | ORAL_TABLET | ORAL | Status: DC
  Administered 2020-10-18: 62.5 ug via ORAL
  Filled 2020-10-13 (×2): qty 1

## 2020-10-13 MED ORDER — HYDROCHLOROTHIAZIDE 25 MG PO TABS
12.5000 mg | ORAL_TABLET | Freq: Every day | ORAL | Status: DC
  Administered 2020-10-13 – 2020-10-17 (×4): 12.5 mg via ORAL
  Filled 2020-10-13 (×4): qty 1

## 2020-10-13 MED ORDER — LEVOTHYROXINE SODIUM 125 MCG PO TABS
125.0000 ug | ORAL_TABLET | ORAL | Status: DC
  Administered 2020-10-14 – 2020-10-24 (×7): 125 ug via ORAL
  Filled 2020-10-13 (×6): qty 1

## 2020-10-13 MED ORDER — ONDANSETRON HCL 4 MG PO TABS
4.0000 mg | ORAL_TABLET | Freq: Four times a day (QID) | ORAL | Status: DC | PRN
  Administered 2020-10-22: 03:00:00 4 mg via ORAL
  Filled 2020-10-13: qty 1

## 2020-10-13 NOTE — ED Provider Notes (Signed)
[facility] DEPT Provider Note   CSN: [id number] Arrival date & time: 10/13/20  0123   History Chief Complaint  Patient presents with  . Abdominal Pain    Tracey Mcclure is a 80 y.o. female.  The history is provided by the patient.  Abdominal Pain She has history of hypertension, hyperlipidemia, atrial fibrillation anticoagulated on apixaban, MGUS and was recently diagnosed with gallstones.  She was sent home with a tube in her gallbladder and she saw the interventional radiologist today who removed the tube and attempted to place another one but was unsuccessful.  She noted severe pain across her abdomen when she was injected with dye through the tube.  Since then, she has had nausea and vomiting and she did have an elevated temperature to 100.2.  She took some acetaminophen and temperature did not get any higher.  She denies chills or sweats.  She currently rates her pain at 8/10.  There is no radiation of pain to the back or shoulder.  Nothing makes the pain better, nothing makes it worse.  Past Medical History:  Diagnosis Date  . Atrial fibrillation ([place]) 12/05/2013  . Colon adenoma   . Gastric mass 10/14/2013  . GERD (gastroesophageal reflux disease)   . HTN (hypertension)    CONTROLLED  . Hypercholesteremia   . Hypertr obst cardiomyop   . Hypothyroidism   . Iron deficiency anemia, unspecified 10/14/2013  . 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  . Mitral insufficiency   . 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  . Obesity   . Palpitations     Patient Active Problem List   Diagnosis Date Noted  . Hyponatremia   . Hypertrophic cardiomyopathy ([place])   . Aortic dilatation ([facility])   . Acute cholecystitis 09/05/2020  . Hypokalemia   . Pacemaker 10/01/2019  . 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 Sinus bradycardia 03/30/2015  . Atrial fibrillation ([place]) 12/05/2013  . Gastric mass 10/14/2013  . MGUS  (monoclonal gammopathy of unknown significance) 10/14/2013  . 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  . Hypertrophic obstructive cardiomyopathy ([place])   . Mitral insufficiency   . HTN (hypertension)   . Palpitations   . Hypercholesteremia   . Hypothyroidism   . GERD (gastroesophageal reflux disease)   . Obesity   . Colon adenoma     Past Surgical History:  Procedure Laterality Date  . ABDOMINAL HYSTERECTOMY    . EP IMPLANTABLE DEVICE N/A 10/26/2015   Procedure: Pacemaker Implant;  Surgeon: Evans Lance, MD;  Location: [facility];  Service: Cardiovascular;  Laterality: N/A;  . IR PERC CHOLECYSTOSTOMY  09/07/2020  . OVARY SURGERY     BENIGN TUMOR  . PACEMAKER REVISION N/A 09/01/2017   Procedure: PACEMAKER REVISION;  Surgeon: Constance Haw, MD;  Location: [facility];  Service: Cardiovascular;  Laterality: N/A;     OB History   No obstetric history on file.     Family History  Problem Relation Age of Onset  . Heart attack Brother 55  . Prostate cancer Brother   . Breast cancer Sister 46    Social History   Tobacco Use  . Smoking status: Never Smoker  . Smokeless tobacco: Never Used  Vaping Use  . Vaping Use: Never used  Substance Use Topics  . Alcohol use: No  . Drug use: No    Home Medications Prior to Admission medications   Medication Sig Start Date End Date  Taking? Authorizing Provider  acetaminophen (TYLENOL) 325 MG tablet Take 650 mg by mouth every 6 (six) hours as needed for mild pain or headache.     [provider]  atorvastatin (LIPITOR) 40 MG tablet TAKE 1 TABLET BY MOUTH EVERYDAY AT BEDTIME 08/12/20   Martinique, Peter M, MD  Cholecalciferol (VITAMIN D) 2000 units CAPS Take 2,000 Units by mouth daily.     [provider]  cyanocobalamin 1000 MCG tablet Take 1,000 mcg by mouth daily.    [provider]  ELIQUIS 5 MG TABS tablet TAKE 1 TABLET BY MOUTH TWICE A DAY 07/24/20   Martinique, Peter M,  MD  hydrochlorothiazide (HYDRODIURIL) 25 MG tablet Take 0.5 tablets (12.5 mg total) by mouth daily. 06/06/16   Martinique, Peter M, MD  hydrocortisone cream 0.5 % Apply 1 application topically daily as needed for itching.    [provider]  levothyroxine (SYNTHROID, LEVOTHROID) 125 MCG tablet Take 125 mcg by mouth daily. Half a tab on Sundays.    [provider]  ondansetron (ZOFRAN) 4 MG tablet Take 1 tablet (4 mg total) by mouth every 6 (six) hours as needed for nausea. 09/09/20   Eugenie Filler, MD  Probiotic Product (PROBIOTIC DAILY PO) Take 1 tablet by mouth 2 (two) times daily.    [provider]    Allergies    Dilaudid [hydromorphone hcl] and Azithromycin  Review of Systems   Review of Systems  Gastrointestinal: Positive for abdominal pain.  All other systems reviewed and are negative.   Physical Exam Updated Vital Signs BP (!) 144/48 (BP Location: Left Arm)   Pulse 62   Temp (!) 97.5 F (36.4 C) (Oral)   Resp 15   LMP  (LMP Unknown)   SpO2 98%   Physical Exam Vitals and nursing note reviewed.   80 year old female, resting comfortably and in no acute distress. Vital signs are significant for borderline elevated blood pressure. Oxygen saturation is 98%, which is normal. Head is normocephalic and atraumatic. PERRLA, EOMI. Oropharynx is clear. Neck is nontender and supple without adenopathy or JVD. Back is nontender and there is no CVA tenderness. Lungs are clear without rales, wheezes, or rhonchi. Chest is nontender. Heart has regular rate and rhythm without murmur. Abdomen is soft, flat, with mild to moderate tenderness diffusely.  Maximum tenderness is in the right mid abdomen.  Dressing is present over site of percutaneous cholecystostomy site.  There are no masses or hepatosplenomegaly and peristalsis is hypoactive. Extremities have no cyanosis or edema, full range of motion is present. Skin is warm and dry without rash. Neurologic: Mental  status is normal, cranial nerves are intact, there are no motor or sensory deficits.  ED Results / Procedures / Treatments   Labs (all labs ordered are listed, but only abnormal results are displayed) Labs Reviewed  COMPREHENSIVE METABOLIC PANEL - Abnormal; Notable for the following components:      Result Value   Sodium 130 (*)    Potassium 3.0 (*)    Glucose, Bld 129 (*)    Calcium 8.8 (*)    Total Protein 6.1 (*)    Albumin 3.1 (*)    AST 403 (*)    ALT 150 (*)    Alkaline Phosphatase 211 (*)    Total Bilirubin 2.6 (*)    All other components within normal limits  CBC WITH DIFFERENTIAL/PLATELET - Abnormal; Notable for the following components:   RBC 3.54 (*)    Hemoglobin 11.3 (*)  HCT 32.6 (*)    All other components within normal limits  PROTIME-INR - Abnormal; Notable for the following components:   Prothrombin Time 18.9 (*)    INR 1.6 (*)    All other components within normal limits  APTT - Abnormal; Notable for the following components:   aPTT 42 (*)    All other components within normal limits  URINALYSIS, ROUTINE W REFLEX MICROSCOPIC - Abnormal; Notable for the following components:   APPearance HAZY (*)    Hgb urine dipstick SMALL (*)    Leukocytes,Ua MODERATE (*)    All other components within normal limits  CULTURE, BLOOD (ROUTINE X 2)  CULTURE, BLOOD (ROUTINE X 2)  SARS CORONAVIRUS 2 (TAT 6-24 HRS)  LACTIC ACID, PLASMA  LACTIC ACID, PLASMA  LIPASE, BLOOD   Radiology CT ABDOMEN PELVIS W CONTRAST  Result Date: 10/13/2020 CLINICAL DATA:  Dislodged cholecystostomy catheter, nausea, vomiting, right upper quadrant abdominal pain EXAM: CT ABDOMEN AND PELVIS WITH CONTRAST TECHNIQUE: Multidetector CT imaging of the abdomen and pelvis was performed using the standard protocol following bolus administration of intravenous contrast. CONTRAST:  138mL OMNIPAQUE IOHEXOL 300 MG/ML  SOLN COMPARISON:  09/05/2020 FINDINGS: Lower chest: The visualized lung bases are clear  bilaterally. Cardiac size is mildly enlarged. There is bulbous dilation of the left ventricular apex suggestive of an aneurysm, not well assessed on this exam. Pacemaker leads are seen within the a right ventricle. Hepatobiliary: Cholelithiasis. The gallbladder wall is circumferentially thickened and there is moderate pericholecystic inflammatory change identified. Trace amount of pericholecystic fluid seen. Punctate foci of gas within the gallbladder lumen are in keeping with the given history of recent catheterization. A a small catheter tract is seen within the right upper quadrant anterior abdominal wall related to the previously placed cholecystostomy catheter. No loculated perihepatic fluid collection identified. Inflammatory changes extend into the sub attic space and abut the hepatic flexure of the colon. The liver is unremarkable. No intra or extrahepatic biliary ductal dilation. Pancreas: Unremarkable Spleen: Unremarkable Adrenals/Urinary Tract: The adrenal glands are unremarkable. The kidneys are normal. The bladder is unremarkable. Stomach/Bowel: Scattered descending and sigmoid colonic diverticulosis. The stomach, small bowel, and large bowel are otherwise unremarkable. Appendix normal. No evidence of obstruction or focal inflammation. No free intraperitoneal gas or fluid. Vascular/Lymphatic: Extensive aortoiliac atherosclerotic calcification. No aortic aneurysm. Particularly prominent atherosclerotic calcifications seen at the origin of the celiac axis. No pathologic adenopathy within the abdomen and pelvis. Reproductive: Status post hysterectomy. No adnexal masses. Other: Small fat containing left inguinal hernia. Rectum unremarkable. 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 Degenerative changes are seen within the lumbar spine. No acute bone abnormality. IMPRESSION: Cholelithiasis. Extensive pericholecystic inflammatory change is nonspecific  and may simply represent changes related to reported chronic catheterization. Trace pericholecystic fluid identified without significant drainable perihepatic fluid collection. Inflammatory changes extend into the right subhepatic space and abut the hepatic flexure of the colon. No intra or extrahepatic biliary ductal dilation identified. Aortic Atherosclerosis (ICD10-I70.0). Electronically Signed   By: Fidela Salisbury MD   On: 10/13/2020 05:56   DG Chest Port 1 View  Result Date: 10/13/2020 CLINICAL DATA:  Questionable sepsis EXAM: PORTABLE CHEST 1 VIEW COMPARISON:  Radiograph 09/02/2017, CT 06/03/2020 FINDINGS: No consolidation, features of edema, pneumothorax, or effusion. The aorta is calcified. Pacer pack overlies left chest wall with paired leads terminating at the cardiac apex in a configuration similar to prior counting for differences in positioning. Dense mitral annular  calcifications are present. The remaining cardiomediastinal contours are unremarkable. No acute osseous or soft tissue abnormality. Degenerative changes are present in the imaged spine and shoulders. IMPRESSION: 1. No acute cardiopulmonary abnormality. 2. Calcification of the mitral annulus, consider outpatient assessment with echocardiography as warranted to assess for valvular dysfunction. 3.  Aortic Atherosclerosis (ICD10-I70.0). Electronically Signed   By: Lovena Le M.D.   On: 10/13/2020 03:21    Procedures Procedures   Medications Ordered in ED Medications  ondansetron (ZOFRAN) injection 4 mg (has no administration in time range)  morphine 4 MG/ML injection 4 mg (has no administration in time range)    ED Course  I have reviewed the triage vital signs and the nursing notes.  Pertinent labs & imaging results that were available during my care of the patient were reviewed by me and considered in my medical decision making (see chart for details).  MDM Rules/Calculators/A&P Abdominal pain following removal of  cholecystostomy tube.  Exam is relatively benign, but will need to evaluate with screening labs and will send for CT of abdomen and pelvis.  Old records are reviewed confirming hospitalization for cholecystitis in late January with placement of cholecystostomy tube and plans for elective cholecystectomy.  CT scan shows pericholecystic inflammatory changes and large gallbladder calculus, but findings are nonspecific.  Labs show hyponatremia and hypokalemia as well as significant elevation of bilirubin, AST, ALT and alkaline phosphatase.  Patient did have good relief of pain but has persistent nausea and is vomiting in spite of antiemetics.  With elevation of liver functions, I feel she will need to be admitted.  Hospitalist has been consulted.  Final Clinical Impression(s) / ED Diagnoses Final diagnoses:  Calculus of gallbladder without cholecystitis without obstruction  RUQ abdominal pain  Non-intractable vomiting with nausea, unspecified vomiting type  Chronic anticoagulation  Elevated liver function tests    Rx / DC Orders ED Discharge Orders    None       Delora Fuel, MD 33/00/76 [phone]

## 2020-10-13 NOTE — ED Triage Notes (Signed)
80 yo female BIBA from home c/o of abdominal pain. Per EMS pt had a drain placed in RUQ for pending gall bladder removal. Pt saw her provider today to change out drain but procedure was unsuccessful, pt now complaining of increased  Pain at the insertion site as well as diffused abdominal pain that she attributes to dye placement by the provider ([facility]). Pt stated when she left the doctors office she did not have any pain due to local anesthetic that was used during the procedure. Pt states pain began when anesthetic wore off.  Pt endorses nausea and vomiting associated with pain.   Vitals: bp 140/70 Hr 80 spo2 98% rr 18 Temp 98.0 tympanic

## 2020-10-13 NOTE — Consult Note (Addendum)
Referring Provider:  [facility]         Primary Care Physician:  Crist Infante, MD Primary Gastroenterologist:  Althia Forts           We were asked to see this patient for:   Elevated liver tests           Attending physician's note   I have taken a history, examined the patient and reviewed the chart. I agree with the Advanced Practitioner's note, impression and recommendations.  80 year old female with history of cholecystitis s/p percutaneous cholecystostomy drain, s/p removal of cholecystostomy drain with worsening abdominal pain and abnormal LFTs consistent with cholestasis  She was recently on antibiotics/Augmentin  LFT pattern is predominantly cholestatic likely secondary to mild drug-induced liver injury in the setting of Augmentin.  No evidence of CBD dilation or choledocholithiasis Patient has a pacemaker, cannot undergo MRCP  PT and INR elevated, no prior labs available for comparison.  No history of underlying liver disease She was previously on chronic anticoagulation with Eliquis  Continue to monitor LFT, PT and INR  Avoid Augmentin  We will defer ERCP for now unless she has definitive evidence of CBD dilation or choledocholithiasis  Management of cholecystitis per surgery team  Dr. Cristina Gong will resume care tomorrow and round on the patient   The patient was provided an opportunity to ask questions and all were answered. The patient agreed with the plan and demonstrated an understanding of the instructions.  Damaris Hippo , MD [phone]         ASSESSMENT / PLAN:   # 80 yo female with cholecystitis, s/p percutaneous drain placement the end of January which was to be followed by cholecystectomy 6-8 weeks later.  Now admitted with new elevation in liver chemistries and abdominal pain after removal of percutaneous drain yesterday. CT scan today shows cholelithiasis and moderate pericholecystic inflammatory change, cholelithiasis, no biliary duct  dilation or other evidence for choledocholithiasis. Etiology of new elevation in liver chemistries  ( predominantly cholestatic pattern ) is unclear but drug induced liver injury is a strong possibility in setting of recent Augmentin.  --If would be difficult to get MRCP as patient has a pacemaker. Recommend proceeding with cholecystectomy IOC.  --Upon completion of consult I discovered that patient is actually known to Dr. Watt Climes [facility]). I talked with Dr. Cristina Gong who is also with [facility] and covering [facility] and he will assume her care going forward. He will see her in follow up tomorrow.   # Atrial fibrillation, home Eliquis is on hold.     HPI:                                                                                                                             Chief Complaint:  Abdominal pain  Tracey Mcclure is a 80 y.o. female with a history of Afib on Eliquis, SSS s/p PPM, hyperlipidemia, hypertrophic cardiomyopathy, hypothyroidism, and MGUS  Patient  was hospitalized late January with acute cholecystitis. She was unable to go to OR due to being anticoagulated. She was started on Zosyn, underwent percutaneous drain placement with plans for lap cholecystectomy in 6-8 weeks later. She was discharged home on Augmentin. Family had been emptying the biliary output and says the drainage was bilious, not purulent. Yesterday patient underwent cholangiogram with attempted drain exchange. Drain was not replaced as access was lost.  Patient presented to ED today with nausea, vomiting and abdominal pain at site of previous percutaneous drain. Patient says she had pain yesterday when dye was injected at time of cholangiogram. The pain was RUQ and right mid abdomen with radiation across mid abdomen. Several hours later when local anesthetic wore off she had recurrent pain and low grade fever  In ED her WBC is normal. Liver chemistries are elevated.   CT scan  abd/ pelvis with contrast  today Hepatobiliary: Cholelithiasis. The gallbladder wall is circumferentially thickened and there is moderate pericholecystic inflammatory change identified. Trace amount of pericholecystic fluid seen. Punctate foci of gas within the gallbladder lumen are in keeping with the given history of recent catheterization. A a small catheter tract is seen within the right upper quadrant anterior abdominal wall related to the previously placed cholecystostomy catheter. No loculated perihepatic fluid collection identified. Inflammatory changes extend into the sub attic space and abut the hepatic flexure of the colon. The liver is unremarkable. No intra or extrahepatic biliary ductal dilation.   Past Medical History:  Diagnosis Date  . Atrial fibrillation ([place]) 12/05/2013  . Colon adenoma   . Gastric mass 10/14/2013  . GERD (gastroesophageal reflux disease)   . HTN (hypertension)    CONTROLLED  . Hypercholesteremia   . Hypertr obst cardiomyop   . Hypothyroidism   . Iron deficiency anemia, unspecified 10/14/2013  . 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  . Mitral insufficiency   . 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  . Obesity   . Palpitations     Past Surgical History:  Procedure Laterality Date  . ABDOMINAL HYSTERECTOMY    . EP IMPLANTABLE DEVICE N/A 10/26/2015   Procedure: Pacemaker Implant;  Surgeon: Evans Lance, MD;  Location: [facility];  Service: Cardiovascular;  Laterality: N/A;  . IR CHOLANGIOGRAM EXISTING TUBE  10/12/2020  . IR PERC CHOLECYSTOSTOMY  09/07/2020  . OVARY SURGERY     BENIGN TUMOR  . PACEMAKER REVISION N/A 09/01/2017   Procedure: PACEMAKER REVISION;  Surgeon: Constance Haw, MD;  Location: [facility];  Service: Cardiovascular;  Laterality: N/A;    Prior to Admission medications   Medication Sig Start Date End Date Taking? Authorizing Provider  acetaminophen (TYLENOL) 325 MG tablet Take 650 mg by  mouth every 6 (six) hours as needed for mild pain or headache.    Yes [provider]  atorvastatin (LIPITOR) 40 MG tablet TAKE 1 TABLET BY MOUTH EVERYDAY AT BEDTIME Patient taking differently: Take 40 mg by mouth daily. 08/12/20  Yes Martinique, Peter M, MD  Cholecalciferol (VITAMIN D) 2000 units CAPS Take 2,000 Units by mouth daily.    Yes [provider]  cyanocobalamin 1000 MCG tablet Take 1,000 mcg by mouth daily.   Yes [provider]  ELIQUIS 5 MG TABS tablet TAKE 1 TABLET BY MOUTH TWICE A DAY Patient taking differently: Take 5 mg by mouth 2 (two) times daily. 07/24/20  Yes Martinique, Peter M, MD  hydrochlorothiazide (HYDRODIURIL) 25 MG tablet Take 0.5 tablets (  12.5 mg total) by mouth daily. 06/06/16  Yes [place]ique, Peter M, MD  levothyroxine (SYNTHROID, LEVOTHROID) 125 MCG tablet Take 62.5-125 mcg by mouth See admin instructions. 114mcg daily EXCEPT 62.56mcg on sunday   Yes [provider]  ondansetron (ZOFRAN) 4 MG tablet Take 1 tablet (4 mg total) by mouth every 6 (six) hours as needed for nausea. 09/09/20  Yes Eugenie Filler, MD  Probiotic Product (PROBIOTIC DAILY PO) Take 1 tablet by mouth 2 (two) times daily.   Yes [provider]    Current Facility-Administered Medications  Medication Dose Route Frequency Provider Last Rate Last Admin  . 0.9 % NaCl with KCl 20 mEq/ L  infusion   Intravenous Continuous Edwin Dada, MD 100 mL/hr at 10/13/20 1116 New Bag at 10/13/20 1116  . potassium chloride 10 mEq in 100 mL IVPB  10 mEq Intravenous Q1 Hr x 4 Danford, Suann Larry, MD 100 mL/hr at 10/13/20 1117 10 mEq at 10/13/20 1117   Current Outpatient Medications  Medication Sig Dispense Refill  . acetaminophen (TYLENOL) 325 MG tablet Take 650 mg by mouth every 6 (six) hours as needed for mild pain or headache.     [name] Kitchen atorvastatin (LIPITOR) 40 MG tablet TAKE 1 TABLET BY MOUTH EVERYDAY AT BEDTIME (Patient taking differently: Take 40 mg by mouth daily.)  90 tablet 3  . Cholecalciferol (VITAMIN D) 2000 units CAPS Take 2,000 Units by mouth daily.     . cyanocobalamin 1000 MCG tablet Take 1,000 mcg by mouth daily.    [name] Kitchen ELIQUIS 5 MG TABS tablet TAKE 1 TABLET BY MOUTH TWICE A DAY (Patient taking differently: Take 5 mg by mouth 2 (two) times daily.) 180 tablet 1  . hydrochlorothiazide (HYDRODIURIL) 25 MG tablet Take 0.5 tablets (12.5 mg total) by mouth daily. 90 tablet 2  . levothyroxine (SYNTHROID, LEVOTHROID) 125 MCG tablet Take 62.5-125 mcg by mouth See admin instructions. 136mcg daily EXCEPT 62.80mcg on sunday    . ondansetron (ZOFRAN) 4 MG tablet Take 1 tablet (4 mg total) by mouth every 6 (six) hours as needed for nausea. 20 tablet 0  . Probiotic Product (PROBIOTIC DAILY PO) Take 1 tablet by mouth 2 (two) times daily.      Allergies as of 10/13/2020 - Review Complete 10/13/2020  Allergen Reaction Noted  . Dilaudid [hydromorphone hcl] Nausea And Vomiting 10/13/2020  . Azithromycin Other (See Comments) 05/06/2020    Family History  Problem Relation Age of Onset  . Heart attack Brother 3  . Prostate cancer Brother   . Breast cancer Sister 82    Social History   Socioeconomic History  . Marital status: Widowed    Spouse name: Not on file  . Number of children: 1  . Years of education: Not on file  . Highest education level: Not on file  Occupational History  . Occupation: Art gallery manager    Comment: retired  Tobacco Use  . Smoking status: Never Smoker  . Smokeless tobacco: Never Used  Vaping Use  . Vaping Use: Never used  Substance and Sexual Activity  . Alcohol use: No  . Drug use: No  . Sexual activity: Never  Other Topics Concern  . Not on file  Social History Narrative  . Not on file   Social Determinants of Health   Financial Resource Strain: Not on file  Food Insecurity: Not on file  Transportation Needs: Not on file  Physical Activity: Not on file  Stress: Not on file  Social Connections: Not  on file  Intimate  Partner Violence: Not on file    Review of Systems: All systems reviewed and negative except where noted in HPI.  OBJECTIVE:    Physical Exam: Vital signs in last 24 hours: Temp:  [97.5 F (36.4 C)] 97.5 F (36.4 C) (03/08 0134) Pulse Rate:  [58-66] 60 (03/08 1115) Resp:  [12-20] 16 (03/08 1115) BP: (110-149)/(48-84) 138/67 (03/08 1115) SpO2:  [91 %-98 %] 95 % (03/08 1115) Weight:  [76.2 kg] 76.2 kg (03/08 0346)   General:   Alert, female in NAD Psych:  Pleasant, cooperative. Normal mood and affect. Eyes:  Pupils equal, sclera clear, no icterus.   Conjunctiva pink. Ears:  Normal auditory acuity. Nose:  No deformity, discharge,  or lesions. Neck:  Supple; no masses Lungs:  Clear throughout to auscultation.   No wheezes, crackles, or rhonchi.  Heart:  Regular rate and rhythm; no murmurs, no lower extremity edema Abdomen:  Soft, non-distended, mild RUQ and right mid abdominal tenderness. BS active, no palp mass   Rectal:  Deferred  Msk:  Symmetrical without gross deformities. . Neurologic:  Alert and  oriented x4;  grossly normal neurologically. Skin:  Intact without significant lesions or rashes.  Filed Weights   10/13/20 0346  Weight: 76.2 kg   Intake/Output this shift: No intake/output data recorded.   Lab Results: Recent Labs    10/13/20 0345  WBC 8.0  HGB 11.3*  HCT 32.6*  PLT 289   BMET Recent Labs    10/13/20 0345  NA 130*  K 3.0*  CL 98  CO2 23  GLUCOSE 129*  BUN 9  CREATININE 0.65  CALCIUM 8.8*   LFT Recent Labs    10/13/20 0345  PROT 6.1*  ALBUMIN 3.1*  AST 403*  ALT 150*  ALKPHOS 211*  BILITOT 2.6*   PT/INR Recent Labs    10/13/20 0345  LABPROT 18.9*  INR 1.6*   Hepatitis Panel No results for input(s): HEPBSAG, HCVAB, HEPAIGM, HEPBIGM in the last 72 hours.   . CBC Latest Ref Rng & Units 10/13/2020 09/09/2020 09/08/2020  WBC 4.0 - 10.5 K/uL 8.0 4.5 4.9  Hemoglobin 12.0 - 15.0 g/dL 11.3(L) 11.3(L) 11.0(L)  Hematocrit 36.0 -  46.0 % 32.6(L) 34.1(L) 33.0(L)  Platelets 150 - 400 K/uL 289 363 331    . CMP Latest Ref Rng & Units 10/13/2020 09/09/2020 09/08/2020  Glucose 70 - 99 mg/dL 129(H) 98 94  BUN 8 - 23 mg/dL 9 8 10   Creatinine 0.44 - 1.00 mg/dL 0.65 0.94 0.92  Sodium 135 - 145 mmol/L 130(L) 135 135  Potassium 3.5 - 5.1 mmol/L 3.0(L) 3.3(L) 3.8  Chloride 98 - 111 mmol/L 98 104 104  CO2 22 - 32 mmol/L 23 25 21(L)  Calcium 8.9 - 10.3 mg/dL 8.8(L) 8.4(L) 8.7(L)  Total Protein 6.5 - 8.1 g/dL 6.1(L) - -  Total Bilirubin 0.3 - 1.2 mg/dL 2.6(H) - -  Alkaline Phos 38 - 126 U/L 211(H) - -  AST 15 - 41 U/L 403(H) - -  ALT 0 - 44 U/L 150(H) - -   Studies/Results: CT ABDOMEN PELVIS W CONTRAST  Result Date: 10/13/2020 CLINICAL DATA:  Dislodged cholecystostomy catheter, nausea, vomiting, right upper quadrant abdominal pain EXAM: CT ABDOMEN AND PELVIS WITH CONTRAST TECHNIQUE: Multidetector CT imaging of the abdomen and pelvis was performed using the standard protocol following bolus administration of intravenous contrast. CONTRAST:  15mL OMNIPAQUE IOHEXOL 300 MG/ML  SOLN COMPARISON:  09/05/2020 FINDINGS: Lower chest: The visualized lung bases are  clear bilaterally. Cardiac size is mildly enlarged. There is bulbous dilation of the left ventricular apex suggestive of an aneurysm, not well assessed on this exam. Pacemaker leads are seen within the a right ventricle. Hepatobiliary: Cholelithiasis. The gallbladder wall is circumferentially thickened and there is moderate pericholecystic inflammatory change identified. Trace amount of pericholecystic fluid seen. Punctate foci of gas within the gallbladder lumen are in keeping with the given history of recent catheterization. A a small catheter tract is seen within the right upper quadrant anterior abdominal wall related to the previously placed cholecystostomy catheter. No loculated perihepatic fluid collection identified. Inflammatory changes extend into the sub attic space and abut the  hepatic flexure of the colon. The liver is unremarkable. No intra or extrahepatic biliary ductal dilation. Pancreas: Unremarkable Spleen: Unremarkable Adrenals/Urinary Tract: The adrenal glands are unremarkable. The kidneys are normal. The bladder is unremarkable. Stomach/Bowel: Scattered descending and sigmoid colonic diverticulosis. The stomach, small bowel, and large bowel are otherwise unremarkable. Appendix normal. No evidence of obstruction or focal inflammation. No free intraperitoneal gas or fluid. Vascular/Lymphatic: Extensive aortoiliac atherosclerotic calcification. No aortic aneurysm. Particularly prominent atherosclerotic calcifications seen at the origin of the celiac axis. No pathologic adenopathy within the abdomen and pelvis. Reproductive: Status post hysterectomy. No adnexal masses. Other: Small fat containing left inguinal hernia. Rectum unremarkable. 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 Degenerative changes are seen within the lumbar spine. No acute bone abnormality. IMPRESSION: Cholelithiasis. Extensive pericholecystic inflammatory change is nonspecific and may simply represent changes related to reported chronic catheterization. Trace pericholecystic fluid identified without significant drainable perihepatic fluid collection. Inflammatory changes extend into the right subhepatic space and abut the hepatic flexure of the colon. No intra or extrahepatic biliary ductal dilation identified. Aortic Atherosclerosis (ICD10-I70.0). Electronically Signed   By: Fidela Salisbury MD   On: 10/13/2020 05:56   DG Chest Port 1 View  Result Date: 10/13/2020 CLINICAL DATA:  Questionable sepsis EXAM: PORTABLE CHEST 1 VIEW COMPARISON:  Radiograph 09/02/2017, CT 06/03/2020 FINDINGS: No consolidation, features of edema, pneumothorax, or effusion. The aorta is calcified. Pacer pack overlies left chest wall with paired leads terminating at the cardiac apex  in a configuration similar to prior counting for differences in positioning. Dense mitral annular calcifications are present. The remaining cardiomediastinal contours are unremarkable. No acute osseous or soft tissue abnormality. Degenerative changes are present in the imaged spine and shoulders. IMPRESSION: 1. No acute cardiopulmonary abnormality. 2. Calcification of the mitral annulus, consider outpatient assessment with echocardiography as warranted to assess for valvular dysfunction. 3.  Aortic Atherosclerosis (ICD10-I70.0). Electronically Signed   By: Lovena Le M.D.   On: 10/13/2020 03:21   IR CHOLANGIOGRAM EXISTING TUBE  Result Date: 10/13/2020 INDICATION: History of acute cholecystitis, status post percutaneous cholecystostomy catheter placement. EXAM: CHOLANGIOGRAM THROUGH EXISTING CATHETER EXCHANGE OF CHOLECYSTOSTOMY CATHETER (ATTEMPTED) MEDICATIONS: None indicated ANESTHESIA/SEDATION: 1% lidocaine subcutaneous FLUOROSCOPY TIME:  13 minutes 54 seconds; 440 mGy COMPLICATIONS: None immediate. PROCEDURE: Initially, cholangiogram through the previously placed cholecystostomy catheter was performed. Informed written consent was obtained from the patient after a thorough discussion of the procedural risks, benefits and alternatives. All questions were addressed. Maximal Sterile Barrier Technique was utilized including caps, mask, sterile gowns, sterile gloves, sterile drape, hand hygiene and skin antiseptic. A timeout was performed prior to the initiation of the procedure. An angled glidewire was advanced through the catheter. The retention string was released and the catheter was withdrawn. Because of unusual catheter course, a 5  [place] Kumpe catheter was advanced over the wire in attempts to gain more central access to the gallbladder lumen. Access could not be achieved so the Kumpe the catheter and guidewire were removed. The patient tolerated the procedure well. FINDINGS: Cholangiogram shows  decompressed gallbladder containing a large stone. There is no opacification of the cystic duct or common duct. A small amount of extravasation around the cholecystostomy catheter was noted at the end of the injection. Attempts to exchange and achieve more durable drain catheter positioning were unsuccessful and percutaneous access to gallbladder was lost. IMPRESSION: 1. Obstructing gallstone with non opacification cystic duct and biliary tree. 2. The malpositioned cholecystostomy catheter could not be repositioned within the gallbladder lumen and percutaneous access was lost. The findings were communicated to the patient, daughter, and general surgery whom the patient is scheduled to see in the office in 2 days. Electronically Signed   By: Lucrezia Europe M.D.   On: 10/13/2020 11:07    Principal Problem:   Transaminitis Active Problems:   Hypertrophic obstructive cardiomyopathy ([facility])   HTN (hypertension)   Hypothyroidism   MGUS (monoclonal gammopathy of unknown significance)   Atrial fibrillation ([place])   Pacemaker   Hypokalemia   Hyponatremia    Tye Savoy, NP-C @  10/13/2020, 11:31 AM

## 2020-10-13 NOTE — H&P (View-Only) (Signed)
Referring Provider:  [facility]         Primary Care Physician:  Crist Infante, MD Primary Gastroenterologist:  Althia Forts           We were asked to see this patient for:   Elevated liver tests           Attending physician's note   I have taken a history, examined the patient and reviewed the chart. I agree with the Advanced Practitioner's note, impression and recommendations.  80 year old female with history of cholecystitis s/p percutaneous cholecystostomy drain, s/p removal of cholecystostomy drain with worsening abdominal pain and abnormal LFTs consistent with cholestasis  She was recently on antibiotics/Augmentin  LFT pattern is predominantly cholestatic likely secondary to mild drug-induced liver injury in the setting of Augmentin.  No evidence of CBD dilation or choledocholithiasis Patient has a pacemaker, cannot undergo MRCP  PT and INR elevated, no prior labs available for comparison.  No history of underlying liver disease She was previously on chronic anticoagulation with Eliquis  Continue to monitor LFT, PT and INR  Avoid Augmentin  We will defer ERCP for now unless she has definitive evidence of CBD dilation or choledocholithiasis  Management of cholecystitis per surgery team  Dr. Cristina Gong will resume care tomorrow and round on the patient   The patient was provided an opportunity to ask questions and all were answered. The patient agreed with the plan and demonstrated an understanding of the instructions.  Damaris Hippo , MD [phone]         ASSESSMENT / PLAN:   # 80 yo female with cholecystitis, s/p percutaneous drain placement the end of January which was to be followed by cholecystectomy 6-8 weeks later.  Now admitted with new elevation in liver chemistries and abdominal pain after removal of percutaneous drain yesterday. CT scan today shows cholelithiasis and moderate pericholecystic inflammatory change, cholelithiasis, no biliary duct  dilation or other evidence for choledocholithiasis. Etiology of new elevation in liver chemistries  ( predominantly cholestatic pattern ) is unclear but drug induced liver injury is a strong possibility in setting of recent Augmentin.  --If would be difficult to get MRCP as patient has a pacemaker. Recommend proceeding with cholecystectomy IOC.  --Upon completion of consult I discovered that patient is actually known to Dr. Watt Climes [facility]). I talked with Dr. Cristina Gong who is also with [facility] and covering [facility] and he will assume her care going forward. He will see her in follow up tomorrow.   # Atrial fibrillation, home Eliquis is on hold.     HPI:                                                                                                                             Chief Complaint:  Abdominal pain  Tracey Mcclure is a 80 y.o. female with a history of Afib on Eliquis, SSS s/p PPM, hyperlipidemia, hypertrophic cardiomyopathy, hypothyroidism, and MGUS  Patient  was hospitalized late January with acute cholecystitis. She was unable to go to OR due to being anticoagulated. She was started on Zosyn, underwent percutaneous drain placement with plans for lap cholecystectomy in 6-8 weeks later. She was discharged home on Augmentin. Family had been emptying the biliary output and says the drainage was bilious, not purulent. Yesterday patient underwent cholangiogram with attempted drain exchange. Drain was not replaced as access was lost.  Patient presented to ED today with nausea, vomiting and abdominal pain at site of previous percutaneous drain. Patient says she had pain yesterday when dye was injected at time of cholangiogram. The pain was RUQ and right mid abdomen with radiation across mid abdomen. Several hours later when local anesthetic wore off she had recurrent pain and low grade fever  In ED her WBC is normal. Liver chemistries are elevated.   CT scan  abd/ pelvis with contrast  today Hepatobiliary: Cholelithiasis. The gallbladder wall is circumferentially thickened and there is moderate pericholecystic inflammatory change identified. Trace amount of pericholecystic fluid seen. Punctate foci of gas within the gallbladder lumen are in keeping with the given history of recent catheterization. A a small catheter tract is seen within the right upper quadrant anterior abdominal wall related to the previously placed cholecystostomy catheter. No loculated perihepatic fluid collection identified. Inflammatory changes extend into the sub attic space and abut the hepatic flexure of the colon. The liver is unremarkable. No intra or extrahepatic biliary ductal dilation.   Past Medical History:  Diagnosis Date  . Atrial fibrillation ([place]) 12/05/2013  . Colon adenoma   . Gastric mass 10/14/2013  . GERD (gastroesophageal reflux disease)   . HTN (hypertension)    CONTROLLED  . Hypercholesteremia   . Hypertr obst cardiomyop   . Hypothyroidism   . Iron deficiency anemia, unspecified 10/14/2013  . 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  . Mitral insufficiency   . 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  . Obesity   . Palpitations     Past Surgical History:  Procedure Laterality Date  . ABDOMINAL HYSTERECTOMY    . EP IMPLANTABLE DEVICE N/A 10/26/2015   Procedure: Pacemaker Implant;  Surgeon: Evans Lance, MD;  Location: [facility];  Service: Cardiovascular;  Laterality: N/A;  . IR CHOLANGIOGRAM EXISTING TUBE  10/12/2020  . IR PERC CHOLECYSTOSTOMY  09/07/2020  . OVARY SURGERY     BENIGN TUMOR  . PACEMAKER REVISION N/A 09/01/2017   Procedure: PACEMAKER REVISION;  Surgeon: Constance Haw, MD;  Location: [facility];  Service: Cardiovascular;  Laterality: N/A;    Prior to Admission medications   Medication Sig Start Date End Date Taking? Authorizing Provider  acetaminophen (TYLENOL) 325 MG tablet Take 650 mg by  mouth every 6 (six) hours as needed for mild pain or headache.    Yes [provider]  atorvastatin (LIPITOR) 40 MG tablet TAKE 1 TABLET BY MOUTH EVERYDAY AT BEDTIME Patient taking differently: Take 40 mg by mouth daily. 08/12/20  Yes Martinique, Peter M, MD  Cholecalciferol (VITAMIN D) 2000 units CAPS Take 2,000 Units by mouth daily.    Yes [provider]  cyanocobalamin 1000 MCG tablet Take 1,000 mcg by mouth daily.   Yes [provider]  ELIQUIS 5 MG TABS tablet TAKE 1 TABLET BY MOUTH TWICE A DAY Patient taking differently: Take 5 mg by mouth 2 (two) times daily. 07/24/20  Yes Martinique, Peter M, MD  hydrochlorothiazide (HYDRODIURIL) 25 MG tablet Take 0.5 tablets (  12.5 mg total) by mouth daily. 06/06/16  Yes [place], Peter M, MD  levothyroxine (SYNTHROID, LEVOTHROID) 125 MCG tablet Take 62.5-125 mcg by mouth See admin instructions. 168mcg daily EXCEPT 62.81mcg on sunday   Yes [provider]  ondansetron (ZOFRAN) 4 MG tablet Take 1 tablet (4 mg total) by mouth every 6 (six) hours as needed for nausea. 09/09/20  Yes Eugenie Filler, MD  Probiotic Product (PROBIOTIC DAILY PO) Take 1 tablet by mouth 2 (two) times daily.   Yes [provider]    Current Facility-Administered Medications  Medication Dose Route Frequency Provider Last Rate Last Admin  . 0.9 % NaCl with KCl 20 mEq/ L  infusion   Intravenous Continuous Edwin Dada, MD 100 mL/hr at 10/13/20 1116 New Bag at 10/13/20 1116  . potassium chloride 10 mEq in 100 mL IVPB  10 mEq Intravenous Q1 Hr x 4 Danford, Suann Larry, MD 100 mL/hr at 10/13/20 1117 10 mEq at 10/13/20 1117   Current Outpatient Medications  Medication Sig Dispense Refill  . acetaminophen (TYLENOL) 325 MG tablet Take 650 mg by mouth every 6 (six) hours as needed for mild pain or headache.     [name] Kitchen atorvastatin (LIPITOR) 40 MG tablet TAKE 1 TABLET BY MOUTH EVERYDAY AT BEDTIME (Patient taking differently: Take 40 mg by mouth daily.)  90 tablet 3  . Cholecalciferol (VITAMIN D) 2000 units CAPS Take 2,000 Units by mouth daily.     . cyanocobalamin 1000 MCG tablet Take 1,000 mcg by mouth daily.    [name] Kitchen ELIQUIS 5 MG TABS tablet TAKE 1 TABLET BY MOUTH TWICE A DAY (Patient taking differently: Take 5 mg by mouth 2 (two) times daily.) 180 tablet 1  . hydrochlorothiazide (HYDRODIURIL) 25 MG tablet Take 0.5 tablets (12.5 mg total) by mouth daily. 90 tablet 2  . levothyroxine (SYNTHROID, LEVOTHROID) 125 MCG tablet Take 62.5-125 mcg by mouth See admin instructions. 127mcg daily EXCEPT 62.28mcg on sunday    . ondansetron (ZOFRAN) 4 MG tablet Take 1 tablet (4 mg total) by mouth every 6 (six) hours as needed for nausea. 20 tablet 0  . Probiotic Product (PROBIOTIC DAILY PO) Take 1 tablet by mouth 2 (two) times daily.      Allergies as of 10/13/2020 - Review Complete 10/13/2020  Allergen Reaction Noted  . Dilaudid [hydromorphone hcl] Nausea And Vomiting 10/13/2020  . Azithromycin Other (See Comments) 05/06/2020    Family History  Problem Relation Age of Onset  . Heart attack Brother 73  . Prostate cancer Brother   . Breast cancer Sister 8    Social History   Socioeconomic History  . Marital status: Widowed    Spouse name: Not on file  . Number of children: 1  . Years of education: Not on file  . Highest education level: Not on file  Occupational History  . Occupation: Art gallery manager    Comment: retired  Tobacco Use  . Smoking status: Never Smoker  . Smokeless tobacco: Never Used  Vaping Use  . Vaping Use: Never used  Substance and Sexual Activity  . Alcohol use: No  . Drug use: No  . Sexual activity: Never  Other Topics Concern  . Not on file  Social History Narrative  . Not on file   Social Determinants of Health   Financial Resource Strain: Not on file  Food Insecurity: Not on file  Transportation Needs: Not on file  Physical Activity: Not on file  Stress: Not on file  Social Connections: Not  on file  Intimate  Partner Violence: Not on file    Review of Systems: All systems reviewed and negative except where noted in HPI.  OBJECTIVE:    Physical Exam: Vital signs in last 24 hours: Temp:  [97.5 F (36.4 C)] 97.5 F (36.4 C) (03/08 0134) Pulse Rate:  [58-66] 60 (03/08 1115) Resp:  [12-20] 16 (03/08 1115) BP: (110-149)/(48-84) 138/67 (03/08 1115) SpO2:  [91 %-98 %] 95 % (03/08 1115) Weight:  [76.2 kg] 76.2 kg (03/08 0346)   General:   Alert, female in NAD Psych:  Pleasant, cooperative. Normal mood and affect. Eyes:  Pupils equal, sclera clear, no icterus.   Conjunctiva pink. Ears:  Normal auditory acuity. Nose:  No deformity, discharge,  or lesions. Neck:  Supple; no masses Lungs:  Clear throughout to auscultation.   No wheezes, crackles, or rhonchi.  Heart:  Regular rate and rhythm; no murmurs, no lower extremity edema Abdomen:  Soft, non-distended, mild RUQ and right mid abdominal tenderness. BS active, no palp mass   Rectal:  Deferred  Msk:  Symmetrical without gross deformities. . Neurologic:  Alert and  oriented x4;  grossly normal neurologically. Skin:  Intact without significant lesions or rashes.  Filed Weights   10/13/20 0346  Weight: 76.2 kg   Intake/Output this shift: No intake/output data recorded.   Lab Results: Recent Labs    10/13/20 0345  WBC 8.0  HGB 11.3*  HCT 32.6*  PLT 289   BMET Recent Labs    10/13/20 0345  NA 130*  K 3.0*  CL 98  CO2 23  GLUCOSE 129*  BUN 9  CREATININE 0.65  CALCIUM 8.8*   LFT Recent Labs    10/13/20 0345  PROT 6.1*  ALBUMIN 3.1*  AST 403*  ALT 150*  ALKPHOS 211*  BILITOT 2.6*   PT/INR Recent Labs    10/13/20 0345  LABPROT 18.9*  INR 1.6*   Hepatitis Panel No results for input(s): HEPBSAG, HCVAB, HEPAIGM, HEPBIGM in the last 72 hours.   . CBC Latest Ref Rng & Units 10/13/2020 09/09/2020 09/08/2020  WBC 4.0 - 10.5 K/uL 8.0 4.5 4.9  Hemoglobin 12.0 - 15.0 g/dL 11.3(L) 11.3(L) 11.0(L)  Hematocrit 36.0 -  46.0 % 32.6(L) 34.1(L) 33.0(L)  Platelets 150 - 400 K/uL 289 363 331    . CMP Latest Ref Rng & Units 10/13/2020 09/09/2020 09/08/2020  Glucose 70 - 99 mg/dL 129(H) 98 94  BUN 8 - 23 mg/dL 9 8 10   Creatinine 0.44 - 1.00 mg/dL 0.65 0.94 0.92  Sodium 135 - 145 mmol/L 130(L) 135 135  Potassium 3.5 - 5.1 mmol/L 3.0(L) 3.3(L) 3.8  Chloride 98 - 111 mmol/L 98 104 104  CO2 22 - 32 mmol/L 23 25 21(L)  Calcium 8.9 - 10.3 mg/dL 8.8(L) 8.4(L) 8.7(L)  Total Protein 6.5 - 8.1 g/dL 6.1(L) - -  Total Bilirubin 0.3 - 1.2 mg/dL 2.6(H) - -  Alkaline Phos 38 - 126 U/L 211(H) - -  AST 15 - 41 U/L 403(H) - -  ALT 0 - 44 U/L 150(H) - -   Studies/Results: CT ABDOMEN PELVIS W CONTRAST  Result Date: 10/13/2020 CLINICAL DATA:  Dislodged cholecystostomy catheter, nausea, vomiting, right upper quadrant abdominal pain EXAM: CT ABDOMEN AND PELVIS WITH CONTRAST TECHNIQUE: Multidetector CT imaging of the abdomen and pelvis was performed using the standard protocol following bolus administration of intravenous contrast. CONTRAST:  147mL OMNIPAQUE IOHEXOL 300 MG/ML  SOLN COMPARISON:  09/05/2020 FINDINGS: Lower chest: The visualized lung bases are  clear bilaterally. Cardiac size is mildly enlarged. There is bulbous dilation of the left ventricular apex suggestive of an aneurysm, not well assessed on this exam. Pacemaker leads are seen within the a right ventricle. Hepatobiliary: Cholelithiasis. The gallbladder wall is circumferentially thickened and there is moderate pericholecystic inflammatory change identified. Trace amount of pericholecystic fluid seen. Punctate foci of gas within the gallbladder lumen are in keeping with the given history of recent catheterization. A a small catheter tract is seen within the right upper quadrant anterior abdominal wall related to the previously placed cholecystostomy catheter. No loculated perihepatic fluid collection identified. Inflammatory changes extend into the sub attic space and abut the  hepatic flexure of the colon. The liver is unremarkable. No intra or extrahepatic biliary ductal dilation. Pancreas: Unremarkable Spleen: Unremarkable Adrenals/Urinary Tract: The adrenal glands are unremarkable. The kidneys are normal. The bladder is unremarkable. Stomach/Bowel: Scattered descending and sigmoid colonic diverticulosis. The stomach, small bowel, and large bowel are otherwise unremarkable. Appendix normal. No evidence of obstruction or focal inflammation. No free intraperitoneal gas or fluid. Vascular/Lymphatic: Extensive aortoiliac atherosclerotic calcification. No aortic aneurysm. Particularly prominent atherosclerotic calcifications seen at the origin of the celiac axis. No pathologic adenopathy within the abdomen and pelvis. Reproductive: Status post hysterectomy. No adnexal masses. Other: Small fat containing left inguinal hernia. Rectum unremarkable. 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 Degenerative changes are seen within the lumbar spine. No acute bone abnormality. IMPRESSION: Cholelithiasis. Extensive pericholecystic inflammatory change is nonspecific and may simply represent changes related to reported chronic catheterization. Trace pericholecystic fluid identified without significant drainable perihepatic fluid collection. Inflammatory changes extend into the right subhepatic space and abut the hepatic flexure of the colon. No intra or extrahepatic biliary ductal dilation identified. Aortic Atherosclerosis (ICD10-I70.0). Electronically Signed   By: Fidela Salisbury MD   On: 10/13/2020 05:56   DG Chest Port 1 View  Result Date: 10/13/2020 CLINICAL DATA:  Questionable sepsis EXAM: PORTABLE CHEST 1 VIEW COMPARISON:  Radiograph 09/02/2017, CT 06/03/2020 FINDINGS: No consolidation, features of edema, pneumothorax, or effusion. The aorta is calcified. Pacer pack overlies left chest wall with paired leads terminating at the cardiac apex  in a configuration similar to prior counting for differences in positioning. Dense mitral annular calcifications are present. The remaining cardiomediastinal contours are unremarkable. No acute osseous or soft tissue abnormality. Degenerative changes are present in the imaged spine and shoulders. IMPRESSION: 1. No acute cardiopulmonary abnormality. 2. Calcification of the mitral annulus, consider outpatient assessment with echocardiography as warranted to assess for valvular dysfunction. 3.  Aortic Atherosclerosis (ICD10-I70.0). Electronically Signed   By: Lovena Le M.D.   On: 10/13/2020 03:21   IR CHOLANGIOGRAM EXISTING TUBE  Result Date: 10/13/2020 INDICATION: History of acute cholecystitis, status post percutaneous cholecystostomy catheter placement. EXAM: CHOLANGIOGRAM THROUGH EXISTING CATHETER EXCHANGE OF CHOLECYSTOSTOMY CATHETER (ATTEMPTED) MEDICATIONS: None indicated ANESTHESIA/SEDATION: 1% lidocaine subcutaneous FLUOROSCOPY TIME:  13 minutes 54 seconds; 101 mGy COMPLICATIONS: None immediate. PROCEDURE: Initially, cholangiogram through the previously placed cholecystostomy catheter was performed. Informed written consent was obtained from the patient after a thorough discussion of the procedural risks, benefits and alternatives. All questions were addressed. Maximal Sterile Barrier Technique was utilized including caps, mask, sterile gowns, sterile gloves, sterile drape, hand hygiene and skin antiseptic. A timeout was performed prior to the initiation of the procedure. An angled glidewire was advanced through the catheter. The retention string was released and the catheter was withdrawn. Because of unusual catheter course, a 5  [place] Kumpe catheter was advanced over the wire in attempts to gain more central access to the gallbladder lumen. Access could not be achieved so the Kumpe the catheter and guidewire were removed. The patient tolerated the procedure well. FINDINGS: Cholangiogram shows  decompressed gallbladder containing a large stone. There is no opacification of the cystic duct or common duct. A small amount of extravasation around the cholecystostomy catheter was noted at the end of the injection. Attempts to exchange and achieve more durable drain catheter positioning were unsuccessful and percutaneous access to gallbladder was lost. IMPRESSION: 1. Obstructing gallstone with non opacification cystic duct and biliary tree. 2. The malpositioned cholecystostomy catheter could not be repositioned within the gallbladder lumen and percutaneous access was lost. The findings were communicated to the patient, daughter, and general surgery whom the patient is scheduled to see in the office in 2 days. Electronically Signed   By: Lucrezia Europe M.D.   On: 10/13/2020 11:07    Principal Problem:   Transaminitis Active Problems:   Hypertrophic obstructive cardiomyopathy (HCC)   HTN (hypertension)   Hypothyroidism   MGUS (monoclonal gammopathy of unknown significance)   Atrial fibrillation ([place])   Pacemaker   Hypokalemia   Hyponatremia    Tye Savoy, NP-C @  10/13/2020, 11:31 AM

## 2020-10-13 NOTE — ED Notes (Signed)
Lab called to add on lipase.

## 2020-10-13 NOTE — H&P (Addendum)
History and Physical  Patient Name: Tracey Mcclure     IWP:[id number]    DOB: Mar 23, 1941    DOA: 10/13/2020 PCP: Crist Infante, MD  Patient coming from: Home  Chief Complaint: Abdominal pain, nausea      HPI: Tracey Mcclure is a 80 y.o. F with hx perm A. Fib and SSS with pacer, HOCM, HTN, MGUS, hypothyroidism and recent cholecystitis who presented with epigastric pain and malaise.  Patient had been admitted 6 weeks ago with cholecystitis, treated with antibiotics and had perc drain placed.  Was doing well, active and eating a normal diet, until day before admission, went to IR clinic for drain evaluation.  A cholangiogram was obtained, and it was determined that the exsting tube was malpositioned, but in repositioning it, access was lost and attempts to replace the tube failed.  Within a few hours of this procedure, the patient was at home, started to have epigastric pain, nausea and subjective fevers.  This persisted and were severe, so she came to the ER.  In the ER, afebrile, WBC normal.    CT showed no fluid collection and known findings of chronic cholecystitis.            ROS: Review of Systems  Constitutional: Positive for fever and malaise/fatigue. Negative for chills.  Respiratory: Negative for cough and shortness of breath.   Cardiovascular: Negative for chest pain, orthopnea, claudication and leg swelling.  Gastrointestinal: Positive for abdominal pain and nausea. Negative for blood in stool, constipation, diarrhea and vomiting.  Genitourinary: Negative for dysuria, flank pain, frequency, hematuria and urgency.  All other systems reviewed and are negative.         Past Medical History:  Diagnosis Date  . Atrial fibrillation ([place]) 12/05/2013  . Colon adenoma   . Gastric mass 10/14/2013  . GERD (gastroesophageal reflux disease)   . HTN (hypertension)    CONTROLLED  . Hypercholesteremia   . Hypertr obst cardiomyop   . Hypothyroidism   . Iron deficiency anemia,  unspecified 10/14/2013  . 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  . Mitral insufficiency   . 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  . Obesity   . Palpitations     Past Surgical History:  Procedure Laterality Date  . ABDOMINAL HYSTERECTOMY    . EP IMPLANTABLE DEVICE N/A 10/26/2015   Procedure: Pacemaker Implant;  Surgeon: Evans Lance, MD;  Location: [facility];  Service: Cardiovascular;  Laterality: N/A;  . IR PERC CHOLECYSTOSTOMY  09/07/2020  . OVARY SURGERY     BENIGN TUMOR  . PACEMAKER REVISION N/A 09/01/2017   Procedure: PACEMAKER REVISION;  Surgeon: Constance Haw, MD;  Location: [facility];  Service: Cardiovascular;  Laterality: N/A;    Social History: Patient lives independently.  The patient walks unassisted.  Nonsmoker.  Allergies  Allergen Reactions  . Dilaudid [Hydromorphone Hcl] Nausea And Vomiting    PT states she has uncontrolled nausea and vomiting   . Azithromycin Other (See Comments)    Patient states that she had severe stomach cramps for a solid 4 hours.     Family history: family history includes Breast cancer (age of onset: 86) in her sister; Heart attack (age of onset: 47) in her brother; Prostate cancer in her brother.  Prior to Admission medications   Medication Sig Start Date End Date Taking? Authorizing Provider  acetaminophen (TYLENOL) 325 MG tablet Take 650 mg by mouth every 6 (six) hours as needed for  mild pain or headache.    Yes [provider]  atorvastatin (LIPITOR) 40 MG tablet TAKE 1 TABLET BY MOUTH EVERYDAY AT BEDTIME Patient taking differently: Take 40 mg by mouth daily. 08/12/20  Yes Martinique, Peter M, MD  Cholecalciferol (VITAMIN D) 2000 units CAPS Take 2,000 Units by mouth daily.    Yes [provider]  cyanocobalamin 1000 MCG tablet Take 1,000 mcg by mouth daily.   Yes [provider]  ELIQUIS 5 MG TABS tablet TAKE 1 TABLET BY MOUTH TWICE A  DAY Patient taking differently: Take 5 mg by mouth 2 (two) times daily. 07/24/20  Yes Martinique, Peter M, MD  hydrochlorothiazide (HYDRODIURIL) 25 MG tablet Take 0.5 tablets (12.5 mg total) by mouth daily. 06/06/16  Yes [place], Peter M, MD  levothyroxine (SYNTHROID, LEVOTHROID) 125 MCG tablet Take 62.5-125 mcg by mouth See admin instructions. 172mcg daily EXCEPT 62.13mcg on sunday   Yes [provider]  ondansetron (ZOFRAN) 4 MG tablet Take 1 tablet (4 mg total) by mouth every 6 (six) hours as needed for nausea. 09/09/20  Yes Eugenie Filler, MD  Probiotic Product (PROBIOTIC DAILY PO) Take 1 tablet by mouth 2 (two) times daily.   Yes [provider]       Physical Exam: BP (!) 144/66   Pulse 60   Temp (!) 97.5 F (36.4 C) (Oral)   Resp 17   Ht 5\' 4"  (1.626 m)   Wt 76.2 kg   LMP  (LMP Unknown)   SpO2 97%   BMI 28.84 kg/m  General appearance: Elderly adult female, alert and in no acute distress, retching from IV morphine.   Eyes: Anicteric, conjunctiva pink, lids and lashes normal. PERRL.    ENT: No nasal deformity, discharge, epistaxis.  Hearing normal. OP moist without lesions.  Denetiion in good repair, teeth good. Skin: Warm and dry.  No jaundice.  No suspicious rashes or lesions. Cardiac: Slow regular, nl J8-H6, soft systolic murmur.  Capillary refill is brisk.  JVP not visible.  No LE edema.  Radial pulses 2+ and symmetric. Respiratory: Normal respiratory rate and rhythm.  CTAB without rales or wheezes. Abdomen: Abdomen soft.  Mild nonfocal, mostly upper TTP without guarding.  No rigidity or rebound. No ascites, distension, hepatosplenomegaly.   MSK: No deformities or effusions of the large joints of the upper or lower extremities bilaterally.  No cyanosis or clubbing. Neuro: Cranial nerves normal.  Sensation intact to light touch. Speech is fluent.  Muscle strength normal.    Psych: Sensorium intact and responding to questions, attention normal.  Behavior  appropriate.  Affect normal.  Judgment and insight appear normal.     Labs on Admission:  I have personally reviewed following labs and imaging studies: CBC: Recent Labs  Lab 10/13/20 0345  WBC 8.0  NEUTROABS 6.1  HGB 11.3*  HCT 32.6*  MCV 92.1  PLT 314   Basic Metabolic Panel: Recent Labs  Lab 10/13/20 0345  NA 130*  K 3.0*  CL 98  CO2 23  GLUCOSE 129*  BUN 9  CREATININE 0.65  CALCIUM 8.8*   GFR: Estimated Creatinine Clearance: 57 mL/min (by C-G formula based on SCr of 0.65 mg/dL).  Liver Function Tests: Recent Labs  Lab 10/13/20 0345  AST 403*  ALT 150*  ALKPHOS 211*  BILITOT 2.6*  PROT 6.1*  ALBUMIN 3.1*   Recent Labs  Lab 10/13/20 0345  LIPASE 23   No results for input(s): AMMONIA in the last 168 hours. Coagulation Profile:  Recent Labs  Lab 10/13/20 0345  INR 1.6*      Radiological Exams on Admission: Personally reviewedCT abdomen report, shows no extravasated iodine contrast from cholangiogram yesterday, shows stable cholecystitis, shows no fluid collection; CXR personally reviewed, shows clear lungs, pacer, no pneumonia or edema: CT ABDOMEN PELVIS W CONTRAST  Result Date: 10/13/2020 CLINICAL DATA:  Dislodged cholecystostomy catheter, nausea, vomiting, right upper quadrant abdominal pain EXAM: CT ABDOMEN AND PELVIS WITH CONTRAST TECHNIQUE: Multidetector CT imaging of the abdomen and pelvis was performed using the standard protocol following bolus administration of intravenous contrast. CONTRAST:  156mL OMNIPAQUE IOHEXOL 300 MG/ML  SOLN COMPARISON:  09/05/2020 FINDINGS: Lower chest: The visualized lung bases are clear bilaterally. Cardiac size is mildly enlarged. There is bulbous dilation of the left ventricular apex suggestive of an aneurysm, not well assessed on this exam. Pacemaker leads are seen within the a right ventricle. Hepatobiliary: Cholelithiasis. The gallbladder wall is circumferentially thickened and there is moderate pericholecystic  inflammatory change identified. Trace amount of pericholecystic fluid seen. Punctate foci of gas within the gallbladder lumen are in keeping with the given history of recent catheterization. A a small catheter tract is seen within the right upper quadrant anterior abdominal wall related to the previously placed cholecystostomy catheter. No loculated perihepatic fluid collection identified. Inflammatory changes extend into the sub attic space and abut the hepatic flexure of the colon. The liver is unremarkable. No intra or extrahepatic biliary ductal dilation. Pancreas: Unremarkable Spleen: Unremarkable Adrenals/Urinary Tract: The adrenal glands are unremarkable. The kidneys are normal. The bladder is unremarkable. Stomach/Bowel: Scattered descending and sigmoid colonic diverticulosis. The stomach, small bowel, and large bowel are otherwise unremarkable. Appendix normal. No evidence of obstruction or focal inflammation. No free intraperitoneal gas or fluid. Vascular/Lymphatic: Extensive aortoiliac atherosclerotic calcification. No aortic aneurysm. Particularly prominent atherosclerotic calcifications seen at the origin of the celiac axis. No pathologic adenopathy within the abdomen and pelvis. Reproductive: Status post hysterectomy. No adnexal masses. Other: Small fat containing left inguinal hernia. Rectum unremarkable. 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 Degenerative changes are seen within the lumbar spine. No acute bone abnormality. IMPRESSION: Cholelithiasis. Extensive pericholecystic inflammatory change is nonspecific and may simply represent changes related to reported chronic catheterization. Trace pericholecystic fluid identified without significant drainable perihepatic fluid collection. Inflammatory changes extend into the right subhepatic space and abut the hepatic flexure of the colon. No intra or extrahepatic biliary ductal dilation  identified. Aortic Atherosclerosis (ICD10-I70.0). Electronically Signed   By: Fidela Salisbury MD   On: 10/13/2020 05:56   DG Chest Port 1 View  Result Date: 10/13/2020 CLINICAL DATA:  Questionable sepsis EXAM: PORTABLE CHEST 1 VIEW COMPARISON:  Radiograph 09/02/2017, CT 06/03/2020 FINDINGS: No consolidation, features of edema, pneumothorax, or effusion. The aorta is calcified. Pacer pack overlies left chest wall with paired leads terminating at the cardiac apex in a configuration similar to prior counting for differences in positioning. Dense mitral annular calcifications are present. The remaining cardiomediastinal contours are unremarkable. No acute osseous or soft tissue abnormality. Degenerative changes are present in the imaged spine and shoulders. IMPRESSION: 1. No acute cardiopulmonary abnormality. 2. Calcification of the mitral annulus, consider outpatient assessment with echocardiography as warranted to assess for valvular dysfunction. 3.  Aortic Atherosclerosis (ICD10-I70.0). Electronically Signed   By: Lovena Le M.D.   On: 10/13/2020 03:21    EKG: Independently reviewed. Rate 60, Afib, paced.       Assessment/Plan   Transaminitis No CBD  dilation on CT.  Other diagnostic considerations include Augmentin induced cholestasis.    Of note, the stone was described as >3cm on imaging last hospitalization.  -Hold statin -Trend LFTs  -Consult GI re: MRCP vs ERCP; if MRCP needed, would need transfer to [facility] for MR compatible pacer monitoring    Cholecystitis Drain removed.  No WBC or fever here, I do not think empiric antibiotics are needed yet.  -Monitor WBC and fever curve  -Consult IR to replace drain (last dose Eliquis 3/7 in PM)   Hyponatremia Asymptomatic. -Trend Na  Hypokalemia -Supplement K -Check mag  Hypothyroidism -Continue levothyroxine  Permanent atrial fibrillation CHA2DS2-Vasc 4.  Given that low risk profile, bridging anticoagulation will more likely  raise her risk of perioperative bleeding, and is unlikely to prevent a thromboembolic event (Douketsis Charolette Child 2015, Ayoub J Stroke Cerebrovasc Dis 2016).  Her rates are controlled  -Hold apixaban  Essential hypertension -Continue HCTZ  MGUS Inactive  HOCM Avoid fluid shifts perioperatively to the extent able       DVT prophylaxis: SCDs  Code Status: DNR  Family Communication: Daughter at bedside  Disposition Plan: Anticipate IR eval and Gen Surg eval.  If drain can be placed tomorrow and Surgery and GI plan no procedures, likely home tomorrow, otherwise will need ongoing hospital care for procedures Consults called: GI, Gen Surg, IR Admission status: OBS   At the point of initial evaluation, it is my clinical opinion that admission for OBSERVATION is reasonable and necessary because the patient's presenting complaints in the context of their chronic conditions represent sufficient risk of deterioration or significant morbidity to constitute reasonable grounds for close observation in the hospital setting, but that the patient may be medically stable for discharge from the hospital within 24 to 48 hours.    Medical decision making: Patient seen at 8:45 AM on 10/13/2020.  The patient was discussed with Dr. Kathrynn Humble, Velora Heckler GI, Tye Savoy, General Surgery [facility] and Dr. Laurence Ferrari IR.  What exists of the patient's chart was reviewed in depth and summarized above.  Clinical condition: stable.        [facility] Please page though [place] or Epic secure chat:  For password, contact charge nurse

## 2020-10-13 NOTE — Consult Note (Signed)
[facility] Surgery Consult Note  Tracey Mcclure 02/27/1941  [id number].    Requesting MD: Myrene Buddy Chief Complaint: Abdominal pain Reason for Consult: Acute cholecystitis s/p IR drain placement  HPI:  Patient is a 80 year old female who was admitted on 09/05/2020 with acute cholecystitis.  Additional problems include atrial fibrillation on anticoagulation, hypertrophic obstructive cardiomyopathy, A-V block with permanent transvenous pacemaker, MGUS.  She also has a history of hysterectomy for tumor about 40 years ago.  She was started on IV antibiotics, cardiology consult was obtained, and ultimately an IR drain was placed.  He was discharged home on 09/09/2020.  He was seen yesterday by IR for a gallbladder drain exchange.  The initial drain was removed but access was lost and a second drain could not be replaced.  She was discharged home and returned to the ED per EMS with abdominal pain.  Work-up in the ED shows she is afebrile and her vital signs are stable.  Labs shows a sodium of 130, potassium of 3.0, glucose of 129, alk phos 211, lipase is 23, AST 403, ALT 150, total bilirubin 2.6, WBC 8.0, H/H 11.3/32.6, platelets 289,000.  Lactate is 1.0, INR is 1.6, PTT is 42, is unremarkable.  Chest x-ray shows no acute abnormalities.  Calcification mitral annulus and aortic atherosclerosis. CT with contrast today: Shows the gallbladder wall is circumferentially thickened and there is moderate pericholecystic inflammatory changes identified.  There is trace amount of pericholecystic fluid seen.  There is punctate foci of gas in the gallbladder lumen.  There is no drainable perihepatic fluid collection.  No intrahepatic or extrahepatic biliary ductal dilatation.  We are asked to see.   ROS: Review of Systems  Constitutional: Positive for fever (100.2 last PM), malaise/fatigue and weight loss (16 labs since 09/05/20).  HENT: Negative.   Eyes: Negative.   Respiratory: Negative.         She has had all 3 COVID vaccinations  Cardiovascular: Negative for chest pain, palpitations, orthopnea, claudication, leg swelling and PND.       She is paced in AF  Gastrointestinal: Positive for abdominal pain (since drain came out), heartburn, nausea (last PM) and vomiting. Negative for blood in stool, constipation, diarrhea and melena.  Genitourinary: Negative.   Musculoskeletal: Negative.   Skin: Negative.   Neurological: Negative.   Endo/Heme/Allergies: Bruises/bleeds easily.  Psychiatric/Behavioral: Negative.     Family History  Problem Relation Age of Onset  . Heart attack Brother 14  . Prostate cancer Brother   . Breast cancer Sister 46    Past Medical History:  Diagnosis Date  . Atrial fibrillation ([place]) 12/05/2013  . Colon adenoma   . Gastric mass 10/14/2013  . GERD (gastroesophageal reflux disease)   . HTN (hypertension)    CONTROLLED  . Hypercholesteremia   . Hypertr obst cardiomyop   . Hypothyroidism   . Iron deficiency anemia, unspecified 10/14/2013  . 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  . Mitral insufficiency   . 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  . Obesity   . Palpitations     Past Surgical History:  Procedure Laterality Date  . ABDOMINAL HYSTERECTOMY    . EP IMPLANTABLE DEVICE N/A 10/26/2015   Procedure: Pacemaker Implant;  Surgeon: Evans Lance, MD;  Location: [place] [facility];  Service: Cardiovascular;  Laterality: N/A;  . IR PERC CHOLECYSTOSTOMY  09/07/2020  . OVARY SURGERY     BENIGN TUMOR  . PACEMAKER REVISION N/A 09/01/2017   Procedure: PACEMAKER  REVISION;  Surgeon: Constance Haw, MD;  Location: [place] [facility];  Service: Cardiovascular;  Laterality: N/A;    Social History:  reports that she has never smoked. She has never used smokeless tobacco. She reports that she does not drink alcohol and does not use drugs.  Allergies:  Allergies  Allergen Reactions  . Dilaudid  [Hydromorphone Hcl] Nausea And Vomiting    PT states she has uncontrolled nausea and vomiting   . Azithromycin Other (See Comments)    Patient states that she had severe stomach cramps for a solid 4 hours.     Prior to Admission medications   Medication Sig Start Date End Date Taking? Authorizing Provider  acetaminophen (TYLENOL) 325 MG tablet Take 650 mg by mouth every 6 (six) hours as needed for mild pain or headache.    Yes [provider]  atorvastatin (LIPITOR) 40 MG tablet TAKE 1 TABLET BY MOUTH EVERYDAY AT BEDTIME Patient taking differently: Take 40 mg by mouth daily. 08/12/20  Yes Martinique, Peter M, MD  Cholecalciferol (VITAMIN D) 2000 units CAPS Take 2,000 Units by mouth daily.    Yes [provider]  cyanocobalamin 1000 MCG tablet Take 1,000 mcg by mouth daily.   Yes [provider]  ELIQUIS 5 MG TABS tablet TAKE 1 TABLET BY MOUTH TWICE A DAY Patient taking differently: Take 5 mg by mouth 2 (two) times daily. 07/24/20  Yes Martinique, Peter M, MD  hydrochlorothiazide (HYDRODIURIL) 25 MG tablet Take 0.5 tablets (12.5 mg total) by mouth daily. 06/06/16  Yes Martinique, Peter M, MD  levothyroxine (SYNTHROID, LEVOTHROID) 125 MCG tablet Take 62.5-125 mcg by mouth See admin instructions. 19mg daily EXCEPT 62.568m on sunday   Yes [provider]  ondansetron (ZOFRAN) 4 MG tablet Take 1 tablet (4 mg total) by mouth every 6 (six) hours as needed for nausea. 09/09/20  Yes ThEugenie FillerMD  Probiotic Product (PROBIOTIC DAILY PO) Take 1 tablet by mouth 2 (two) times daily.   Yes [provider]     Blood pressure 119/60, pulse 60, temperature (!) 97.5 F (36.4 C), temperature source Oral, resp. rate 12, height '5\' 4"'  (1.626 m), weight 76.2 kg, SpO2 96 %. Physical Exam:  General: pleasant, WD, WN white female who is laying in bed in NAD, she has had some morphine so she is not having any pain currently. HEENT: head is normocephalic, atraumatic.  Sclera are  noninjected.  Pupils are equal ears and nose without any masses or lesions.  Mouth is pink and moist Heart: regular, rate, and rhythm.  She is in atrial flutter and is paced almost 100% while I am in the room.  She has a mitral murmur..  Palpable radial and pedal pulses bilaterally Lungs: CTAB, no wheezes, rhonchi, or rales noted.  Respiratory effort nonlabored Abd: soft, NT, ND, +BS, no masses, hernias, or organomegaly, dressing over the drain site but no pain on palpation currently.  She has a prior midline surgical scar below the umbilicus. MS: all 4 extremities are symmetrical with no cyanosis, clubbing, or edema. Skin: warm and dry with no masses, lesions, or rashes Neuro: Cranial nerves 2-12 grossly intact, sensation is normal throughout Psych: A&Ox3 with an appropriate affect.   Results for orders placed or performed during the hospital encounter of 10/13/20 (from the past 48 hour(s))  Lactic acid, plasma     Status: None   Collection Time: 10/13/20  3:45 AM  Result Value Ref Range   Lactic Acid, Venous  0.9 0.5 - 1.9 mmol/L    Comment: Performed at [facility], [place] [address] [place], [place] [address]  Comprehensive metabolic panel     Status: Abnormal   Collection Time: 10/13/20  3:45 AM  Result Value Ref Range   Sodium 130 (L) 135 - 145 mmol/L   Potassium 3.0 (L) 3.5 - 5.1 mmol/L   Chloride 98 98 - 111 mmol/L   CO2 23 22 - 32 mmol/L   Glucose, Bld 129 (H) 70 - 99 mg/dL    Comment: Glucose reference range applies only to samples taken after fasting for at least 8 hours.   BUN 9 8 - 23 mg/dL   Creatinine, Ser 0.65 0.44 - 1.00 mg/dL   Calcium 8.8 (L) 8.9 - 10.3 mg/dL   Total Protein 6.1 (L) 6.5 - 8.1 g/dL   Albumin 3.1 (L) 3.5 - 5.0 g/dL   AST 403 (H) 15 - 41 U/L   ALT 150 (H) 0 - 44 U/L   Alkaline Phosphatase 211 (H) 38 - 126 U/L   Total Bilirubin 2.6 (H) 0.3 - 1.2 mg/dL   GFR, Estimated >60 >60 mL/min    Comment: (NOTE) Calculated using the CKD-EPI  Creatinine Equation (2021)    Anion gap 9 5 - 15    Comment: Performed at [facility], [place] [address] [place], [place] [address]  CBC WITH DIFFERENTIAL     Status: Abnormal   Collection Time: 10/13/20  3:45 AM  Result Value Ref Range   WBC 8.0 4.0 - 10.5 K/uL   RBC 3.54 (L) 3.87 - 5.11 MIL/uL   Hemoglobin 11.3 (L) 12.0 - 15.0 g/dL   HCT 32.6 (L) 36.0 - 46.0 %   MCV 92.1 80.0 - 100.0 fL   MCH 31.9 26.0 - 34.0 pg   MCHC 34.7 30.0 - 36.0 g/dL   RDW 12.7 11.5 - 15.5 %   Platelets 289 150 - 400 K/uL   nRBC 0.0 0.0 - 0.2 %   Neutrophils Relative % 77 %   Neutro Abs 6.1 1.7 - 7.7 K/uL   Lymphocytes Relative 15 %   Lymphs Abs 1.2 0.7 - 4.0 K/uL   Monocytes Relative 7 %   Monocytes Absolute 0.6 0.1 - 1.0 K/uL   Eosinophils Relative 1 %   Eosinophils Absolute 0.0 0.0 - 0.5 K/uL   Basophils Relative 0 %   Basophils Absolute 0.0 0.0 - 0.1 K/uL   Immature Granulocytes 0 %   Abs Immature Granulocytes 0.02 0.00 - 0.07 K/uL    Comment: Performed at [facility], [place] [address], [place], [place] [address]  Protime-INR     Status: Abnormal   Collection Time: 10/13/20  3:45 AM  Result Value Ref Range   Prothrombin Time 18.9 (H) 11.4 - 15.2 seconds   INR 1.6 (H) 0.8 - 1.2    Comment: (NOTE) INR goal varies based on device and disease states. Performed at [facility], [place] [address], [place], [place] [address]   APTT     Status: Abnormal   Collection Time: 10/13/20  3:45 AM  Result Value Ref Range   aPTT 42 (H) 24 - 36 seconds    Comment:        IF BASELINE aPTT IS ELEVATED, SUGGEST PATIENT RISK ASSESSMENT BE USED TO DETERMINE APPROPRIATE ANTICOAGULANT THERAPY. Performed at [facility], [place] [address], [place], [place] [address]   Urinalysis, Routine w reflex microscopic Urine, Clean Catch  Status: Abnormal   Collection Time: 10/13/20  3:45 AM  Result Value Ref Range   Color, Urine YELLOW YELLOW    APPearance HAZY (A) CLEAR   Specific Gravity, Urine 1.010 1.005 - 1.030   pH 6.0 5.0 - 8.0   Glucose, UA NEGATIVE NEGATIVE mg/dL   Hgb urine dipstick SMALL (A) NEGATIVE   Bilirubin Urine NEGATIVE NEGATIVE   Ketones, ur NEGATIVE NEGATIVE mg/dL   Protein, ur NEGATIVE NEGATIVE mg/dL   Nitrite NEGATIVE NEGATIVE   Leukocytes,Ua MODERATE (A) NEGATIVE   RBC / HPF 0-5 0 - 5 RBC/hpf   WBC, UA 6-10 0 - 5 WBC/hpf   Bacteria, UA NONE SEEN NONE SEEN   Squamous Epithelial / LPF 11-20 0 - 5    Comment: Performed at [facility], [place] [address], [place], [place] [address]  Lipase, blood     Status: None   Collection Time: 10/13/20  3:45 AM  Result Value Ref Range   Lipase 23 11 - 51 U/L    Comment: Performed at [facility], [place] [address], [place], [place] [address]  Lactic acid, plasma     Status: None   Collection Time: 10/13/20  5:18 AM  Result Value Ref Range   Lactic Acid, Venous 1.0 0.5 - 1.9 mmol/L    Comment: Performed at [facility], [place] [address], [place],  [address]   CT ABDOMEN PELVIS W CONTRAST  Result Date: 10/13/2020 CLINICAL DATA:  Dislodged cholecystostomy catheter, nausea, vomiting, right upper quadrant abdominal pain EXAM: CT ABDOMEN AND PELVIS WITH CONTRAST TECHNIQUE: Multidetector CT imaging of the abdomen and pelvis was performed using the standard protocol following bolus administration of intravenous contrast. CONTRAST:  131m OMNIPAQUE IOHEXOL 300 MG/ML  SOLN COMPARISON:  09/05/2020 FINDINGS: Lower chest: The visualized lung bases are clear bilaterally. Cardiac size is mildly enlarged. There is bulbous dilation of the left ventricular apex suggestive of an aneurysm, not well assessed on this exam. Pacemaker leads are seen within the a right ventricle. Hepatobiliary: Cholelithiasis. The gallbladder wall is circumferentially thickened and there is moderate pericholecystic inflammatory change identified. Trace amount  of pericholecystic fluid seen. Punctate foci of gas within the gallbladder lumen are in keeping with the given history of recent catheterization. A a small catheter tract is seen within the right upper quadrant anterior abdominal wall related to the previously placed cholecystostomy catheter. No loculated perihepatic fluid collection identified. Inflammatory changes extend into the sub attic space and abut the hepatic flexure of the colon. The liver is unremarkable. No intra or extrahepatic biliary ductal dilation. Pancreas: Unremarkable Spleen: Unremarkable Adrenals/Urinary Tract: The adrenal glands are unremarkable. The kidneys are normal. The bladder is unremarkable. Stomach/Bowel: Scattered descending and sigmoid colonic diverticulosis. The stomach, small bowel, and large bowel are otherwise unremarkable. Appendix normal. No evidence of obstruction or focal inflammation. No free intraperitoneal gas or fluid. Vascular/Lymphatic: Extensive aortoiliac atherosclerotic calcification. No aortic aneurysm. Particularly prominent atherosclerotic calcifications seen at the origin of the celiac axis. No pathologic adenopathy within the abdomen and pelvis. Reproductive: Status post hysterectomy. No adnexal masses. Other: Small fat containing left inguinal hernia. Rectum unremarkable. 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 Degenerative changes are seen within the lumbar spine. No acute bone abnormality. IMPRESSION: Cholelithiasis. Extensive pericholecystic inflammatory change is nonspecific and may simply represent changes related to reported chronic catheterization. Trace pericholecystic fluid identified without significant drainable perihepatic fluid collection. Inflammatory changes extend into the right  subhepatic space and abut the hepatic flexure of the colon. No intra or extrahepatic biliary ductal dilation identified. Aortic Atherosclerosis (ICD10-I70.0).  Electronically Signed   By: Fidela Salisbury MD   On: 10/13/2020 05:56   DG Chest Port 1 View  Result Date: 10/13/2020 CLINICAL DATA:  Questionable sepsis EXAM: PORTABLE CHEST 1 VIEW COMPARISON:  Radiograph 09/02/2017, CT 06/03/2020 FINDINGS: No consolidation, features of edema, pneumothorax, or effusion. The aorta is calcified. Pacer pack overlies left chest wall with paired leads terminating at the cardiac apex in a configuration similar to prior counting for differences in positioning. Dense mitral annular calcifications are present. The remaining cardiomediastinal contours are unremarkable. No acute osseous or soft tissue abnormality. Degenerative changes are present in the imaged spine and shoulders. IMPRESSION: 1. No acute cardiopulmonary abnormality. 2. Calcification of the mitral annulus, consider outpatient assessment with echocardiography as warranted to assess for valvular dysfunction. 3.  Aortic Atherosclerosis (ICD10-I70.0). Electronically Signed   By: Lovena Le M.D.   On: 10/13/2020 03:21      Assessment/Plan Atrial fibrillation on chronic anticoagulation last dose Eliquis p.m. 10/12/2020 Permanent transvenous pacemaker/almost 100% pacing Hypertrophic cardiomyopathy/moderate MI Cardiology consult 09/06/2020: Low to moderate risk, minimize time off anticoagulation Hypertension Hypothyroid Monoclonal gammopathy of unknown significance Hypokalemia Elevated LFTs Decreased appetite/weight loss -16 pounds since 09/05/2020  Acute cholecystitis with IR drain placement 09/07/2020 IR drain removal 10/12/2020, with recurrent abdominal pain  -CT today shows extensive pericholecystic inflammatory changes nonspecific  FEN: Ice chips/IV fluids ID: None DVT: Eliquis last dose p.m. 10/12/2020   Plan: Patient states she was doing really well with the drain.  She was okay but has had ongoing abdominal discomfort since the drain came out.  Last evening she developed nausea and vomiting, low-grade  fever.  Will review with Dr. Hassell Done, also ask IR to evaluate again.  I would restart antibiotics.  I would keep her off her Eliquis and cover her with heparin for now.  Will discuss options of new drain, vs antibiotics, allow Eliquis to wear off, and surgery this hospital admission.   Earnstine Regal [facility] Surgery 10/13/2020, 10:20 AM Please see Amion for pager number during day hours 7:00am-4:30pm

## 2020-10-14 DIAGNOSIS — Z9049 Acquired absence of other specified parts of digestive tract: Secondary | ICD-10-CM | POA: Diagnosis not present

## 2020-10-14 DIAGNOSIS — K219 Gastro-esophageal reflux disease without esophagitis: Secondary | ICD-10-CM | POA: Diagnosis not present

## 2020-10-14 DIAGNOSIS — Y838 Other surgical procedures as the cause of abnormal reaction of the patient, or of later complication, without mention of misadventure at the time of the procedure: Secondary | ICD-10-CM | POA: Diagnosis not present

## 2020-10-14 DIAGNOSIS — E876 Hypokalemia: Secondary | ICD-10-CM

## 2020-10-14 DIAGNOSIS — I422 Other hypertrophic cardiomyopathy: Secondary | ICD-10-CM | POA: Diagnosis not present

## 2020-10-14 DIAGNOSIS — E8809 Other disorders of plasma-protein metabolism, not elsewhere classified: Secondary | ICD-10-CM | POA: Diagnosis present

## 2020-10-14 DIAGNOSIS — I421 Obstructive hypertrophic cardiomyopathy: Secondary | ICD-10-CM

## 2020-10-14 DIAGNOSIS — K8067 Calculus of gallbladder and bile duct with acute and chronic cholecystitis with obstruction: Secondary | ICD-10-CM | POA: Diagnosis not present

## 2020-10-14 DIAGNOSIS — N99512 Cystostomy malfunction: Secondary | ICD-10-CM | POA: Diagnosis not present

## 2020-10-14 DIAGNOSIS — K8050 Calculus of bile duct without cholangitis or cholecystitis without obstruction: Secondary | ICD-10-CM

## 2020-10-14 DIAGNOSIS — M50323 Other cervical disc degeneration at C6-C7 level: Secondary | ICD-10-CM | POA: Diagnosis not present

## 2020-10-14 DIAGNOSIS — Z7901 Long term (current) use of anticoagulants: Secondary | ICD-10-CM | POA: Diagnosis not present

## 2020-10-14 DIAGNOSIS — R933 Abnormal findings on diagnostic imaging of other parts of digestive tract: Secondary | ICD-10-CM | POA: Diagnosis not present

## 2020-10-14 DIAGNOSIS — Z48815 Encounter for surgical aftercare following surgery on the digestive system: Secondary | ICD-10-CM | POA: Diagnosis not present

## 2020-10-14 DIAGNOSIS — E039 Hypothyroidism, unspecified: Secondary | ICD-10-CM

## 2020-10-14 DIAGNOSIS — Z9689 Presence of other specified functional implants: Secondary | ICD-10-CM | POA: Diagnosis present

## 2020-10-14 DIAGNOSIS — D472 Monoclonal gammopathy: Secondary | ICD-10-CM | POA: Diagnosis not present

## 2020-10-14 DIAGNOSIS — E871 Hypo-osmolality and hyponatremia: Secondary | ICD-10-CM

## 2020-10-14 DIAGNOSIS — R748 Abnormal levels of other serum enzymes: Secondary | ICD-10-CM | POA: Diagnosis not present

## 2020-10-14 DIAGNOSIS — E785 Hyperlipidemia, unspecified: Secondary | ICD-10-CM | POA: Diagnosis present

## 2020-10-14 DIAGNOSIS — I4819 Other persistent atrial fibrillation: Secondary | ICD-10-CM | POA: Diagnosis not present

## 2020-10-14 DIAGNOSIS — Z79899 Other long term (current) drug therapy: Secondary | ICD-10-CM | POA: Diagnosis not present

## 2020-10-14 DIAGNOSIS — K9189 Other postprocedural complications and disorders of digestive system: Secondary | ICD-10-CM | POA: Diagnosis not present

## 2020-10-14 DIAGNOSIS — I495 Sick sinus syndrome: Secondary | ICD-10-CM | POA: Diagnosis not present

## 2020-10-14 DIAGNOSIS — M25512 Pain in left shoulder: Secondary | ICD-10-CM | POA: Diagnosis not present

## 2020-10-14 DIAGNOSIS — R109 Unspecified abdominal pain: Secondary | ICD-10-CM | POA: Diagnosis not present

## 2020-10-14 DIAGNOSIS — T83020A Displacement of cystostomy catheter, initial encounter: Secondary | ICD-10-CM | POA: Diagnosis not present

## 2020-10-14 DIAGNOSIS — K839 Disease of biliary tract, unspecified: Secondary | ICD-10-CM | POA: Diagnosis not present

## 2020-10-14 DIAGNOSIS — E7800 Pure hypercholesterolemia, unspecified: Secondary | ICD-10-CM | POA: Diagnosis not present

## 2020-10-14 DIAGNOSIS — I4821 Permanent atrial fibrillation: Secondary | ICD-10-CM | POA: Diagnosis not present

## 2020-10-14 DIAGNOSIS — I10 Essential (primary) hypertension: Secondary | ICD-10-CM | POA: Diagnosis present

## 2020-10-14 DIAGNOSIS — K810 Acute cholecystitis: Secondary | ICD-10-CM | POA: Diagnosis not present

## 2020-10-14 DIAGNOSIS — K819 Cholecystitis, unspecified: Secondary | ICD-10-CM | POA: Diagnosis not present

## 2020-10-14 DIAGNOSIS — M19012 Primary osteoarthritis, left shoulder: Secondary | ICD-10-CM | POA: Diagnosis not present

## 2020-10-14 DIAGNOSIS — D75839 Thrombocytosis, unspecified: Secondary | ICD-10-CM | POA: Diagnosis present

## 2020-10-14 DIAGNOSIS — Z20822 Contact with and (suspected) exposure to covid-19: Secondary | ICD-10-CM | POA: Diagnosis not present

## 2020-10-14 DIAGNOSIS — K8000 Calculus of gallbladder with acute cholecystitis without obstruction: Secondary | ICD-10-CM | POA: Diagnosis not present

## 2020-10-14 DIAGNOSIS — Z950 Presence of cardiac pacemaker: Secondary | ICD-10-CM | POA: Diagnosis not present

## 2020-10-14 DIAGNOSIS — Z79890 Hormone replacement therapy (postmenopausal): Secondary | ICD-10-CM | POA: Diagnosis not present

## 2020-10-14 DIAGNOSIS — Z66 Do not resuscitate: Secondary | ICD-10-CM | POA: Diagnosis not present

## 2020-10-14 DIAGNOSIS — M50321 Other cervical disc degeneration at C4-C5 level: Secondary | ICD-10-CM | POA: Diagnosis not present

## 2020-10-14 DIAGNOSIS — K8062 Calculus of gallbladder and bile duct with acute cholecystitis without obstruction: Secondary | ICD-10-CM | POA: Diagnosis not present

## 2020-10-14 DIAGNOSIS — R7401 Elevation of levels of liver transaminase levels: Secondary | ICD-10-CM | POA: Diagnosis not present

## 2020-10-14 DIAGNOSIS — K838 Other specified diseases of biliary tract: Secondary | ICD-10-CM | POA: Diagnosis not present

## 2020-10-14 LAB — CBC
HCT: 30.6 % — ABNORMAL LOW (ref 36.0–46.0)
Hemoglobin: 10.2 g/dL — ABNORMAL LOW (ref 12.0–15.0)
MCH: 32 pg (ref 26.0–34.0)
MCHC: 33.3 g/dL (ref 30.0–36.0)
MCV: 95.9 fL (ref 80.0–100.0)
Platelets: 269 10*3/uL (ref 150–400)
RBC: 3.19 MIL/uL — ABNORMAL LOW (ref 3.87–5.11)
RDW: 13.2 % (ref 11.5–15.5)
WBC: 5.4 10*3/uL (ref 4.0–10.5)
nRBC: 0 % (ref 0.0–0.2)

## 2020-10-14 LAB — COMPREHENSIVE METABOLIC PANEL
ALT: 179 U/L — ABNORMAL HIGH (ref 0–44)
AST: 224 U/L — ABNORMAL HIGH (ref 15–41)
Albumin: 2.6 g/dL — ABNORMAL LOW (ref 3.5–5.0)
Alkaline Phosphatase: 256 U/L — ABNORMAL HIGH (ref 38–126)
Anion gap: 8 (ref 5–15)
BUN: 7 mg/dL — ABNORMAL LOW (ref 8–23)
CO2: 23 mmol/L (ref 22–32)
Calcium: 8.7 mg/dL — ABNORMAL LOW (ref 8.9–10.3)
Chloride: 104 mmol/L (ref 98–111)
Creatinine, Ser: 0.7 mg/dL (ref 0.44–1.00)
GFR, Estimated: >60 mL/min (ref >60)
Glucose, Bld: 102 mg/dL — ABNORMAL HIGH (ref 70–99)
Potassium: 4.4 mmol/L (ref 3.5–5.1)
Sodium: 135 mmol/L (ref 135–145)
Total Bilirubin: 3.7 mg/dL — ABNORMAL HIGH (ref 0.3–1.2)
Total Protein: 5.2 g/dL — ABNORMAL LOW (ref 6.5–8.1)

## 2020-10-14 MED ORDER — POTASSIUM CHLORIDE IN NACL 20-0.9 MEQ/L-% IV SOLN
INTRAVENOUS | Status: AC
  Filled 2020-10-14: qty 1000

## 2020-10-14 MED ORDER — POTASSIUM CHLORIDE IN NACL 20-0.9 MEQ/L-% IV SOLN: INTRAVENOUS | Status: DC

## 2020-10-14 NOTE — Progress Notes (Signed)
Patient feels well today and is free of abdominal pain.  Liver chemistries show a mixed picture with improvement in AST, slight rise in bilirubin and alkaline phosphatase in the setting of recent Augmentin usage.  I agree with yesterday's GI consultation note that, in the absence of biliary ductal dilatation, this patient's elevated liver chemistries are probably not due to to choledocholithiasis (unfortunately, MRCP not able to be done, because of pacemaker placement).  More likely explanations for the elevated liver chemistries would include a lingering effect from the patient's recent Augmentin use, and/or hepatic inflammation due to the radiographically demonstrated pericholecystic inflammatory process.  In my opinion, it would be best to save this patient for the potential risk of ERCP, by proceeding to laparoscopic cholecystectomy (as the patient wishes) with intraoperative cholangiography, reserving ERCP for either a documented abnormality of the common duct such as an intraductal stone, or inability to obtain a cholangiogram intraoperatively, with persistent elevation of liver chemistries.  I explained this to the patient and her nephew, who is at the bedside, and they seem comfortable with this approach.  Case discussed with Dr. Hassell Done, who is comfortable with proceeding with surgery without preoperative ERCP.  Timing to depend on several factors including allowing the patient's Eliquis to get out of her system, and perhaps some more time for inflammation to subside.  Please call us if we can be of further assistance in this patient's care but for now I will plan to sign off.  Cleotis Nipper, M.D. Pager [phone] If no answer or after 5 PM call [phone]

## 2020-10-14 NOTE — Progress Notes (Signed)
(  Patient not seen --no charge)  Her liver chemistries are little higher today.  Not a candidate for MRCP because of history of pacemaker.  The main question is whether she should have preoperative endoscopic ultrasound and/or ERCP, or go straight to surgery with an intraoperative cholangiogram.  Would be happy to discuss the case with you if needed, or to see the patient at your request.  Otherwise, I will simply remain on standby.  Cleotis Nipper, M.D. Pager [phone] If no answer or after 5 PM call [phone]

## 2020-10-14 NOTE — Progress Notes (Signed)
PROGRESS NOTE    Tracey Mcclure  NUU:[id number] DOB: 19-Feb-1941 DOA: 10/13/2020 PCP: Crist Infante, MD   Brief Narrative:  The patient is a a 80 year old overweight Caucasian female with a past medical history significant for but not limited to permanent atrial fibrillation and sick sinus syndrome with a pacemaker in place, hokum, hypertension, MGUS, hypothyroidism, as well as other comorbidities who had a recent cholecystitis and presented back to the hospital with abdominal pain, nausea and malaise.  She had been admitted 6 weeks ago with cholecystitis and treated with antibiotics and had a PERC drain in place.  She is doing well and was active and eating a normal diet until the day before admission.  She went to the IR clinic for drain evaluation and cholangiogram but it obtained and is determined that the existing tube was malpositioned but in trying to reposition it access was lost and attempt to replace the tube failed.  The few hours of the procedure patient was sent home and started to have epigastric pain, nausea and subjective fevers.  This persisted and was so severe that she came to the ER for further evaluation.  In the ER she was noted to be afebrile with a normal white blood cell count and had a CT of the abdomen pelvis done which showed no fluid collection and had no findings of chronic cholecystitis.  She did have abnormal LFTs so both general surgery and gastroenterology were both consulted for further evaluation recommendations.  Gastroenterology feels that her it would be difficult to get a MRCP as the patient has a pacemaker and they are recommending proceeding with a cholecystectomy with an [place] currently her Eliquis is on hold.  She is on a clear liquid diet for now and general surgery planning on taking the patient for surgical intervention likely on Friday, 10/16/2020.  GI recommends following up with the IOC and does not recommend up preoperative ERCP or EUS at this time.  Assessment  & Plan:   Principal Problem:   Transaminitis Active Problems:   Hypertrophic obstructive cardiomyopathy (HCC)   HTN (hypertension)   Hypothyroidism   MGUS (monoclonal gammopathy of unknown significance)   Atrial fibrillation (HCC)   Pacemaker   Hypokalemia   Hyponatremia   Choledocholithiasis  Abnormal LFT's/Transaminitis Hyperbilirubinemia -No CBD dilation on CT.  Other diagnostic considerations include Augmentin induced cholestasis.   -Of note, the stone was described as >3cm on imaging last hospitalization. -Continue to Hold statin -Trend LFTs and AST went from 403 -> 224 and ALT went from 150 -> 179 -T Bili worsened and went from 2.6 -> 3.7 -GI and General Surgery Consulted -Consulted GI re: MRCP vs ERCP; if MRCP needed, would need transfer to [facility] for MR compatible pacer monitoring and I spoke with Dr. Cristina Gong personally who feels that she does not need an MRCP given her pacer and does not feel that she needs a perioperative ERCP or EUS at this time and recommend proceeding with a surgery and intraoperative cholangiogram at that is positive then at that time they were doing ERCP  Acute on chronic cholecystitis -PERC Chole drain removed.  No WBC or fever here, I do not think empiric antibiotics are needed yet we will continue to hold off unless general surgery feels otherwise -Monitor WBC and fever curve -Initially was going to consult IR to replace drain (last dose Eliquis 3/7 in PM) however she did not undergo a cholecystectomy on Friday so we do not feel the need to  reconsult IR -Follow blood cultures x2 -Further care per general surgery and she will require liquid diet for now -Will need PT/OT to evaluate and Treat  Hyponatremia -Asymptomatic. -Sodium has improved and gone from 130 is now 135 -Getting IV fluid hydration with normal saline +20 mEq of KCl and will reduce the rate from 100 to 75 mL's per hour -Continue to monitor and trend and repeat CMP in  a.m.  Hypokalemia -Improved as patient's potassium level of 3.0 is now 4.4 -Continue to monitor and replete as necessary -Currently getting normal saline with 20 mEq KCl but will reduce the dose from 100 mL to 75 mL's for another 12 more hours -Repeat CMP in a.m. -We will also check mag level in a.m.  Hypothyroidism -Continue Levothyroxine with 125 mcg p.o. Monday Tuesday Wednesday Thursday Friday Saturday and 62.5 mcg p.o. every Sunday -Check TSH in the AM   Permanent Atrial Fibrillation -CHA2DS2-Vasc 4.  Given that low risk profile, bridging anticoagulation will more likely raise her risk of perioperative bleeding, and is unlikely to prevent a thromboembolic event (Douketsis Charolette Child 2015, Ayoub J Stroke Cerebrovasc Dis 2016). -Her rates are controlled -Continue to Hold apixaban  Essential Hypertension -Continue extremely low-dose hydrochlorothiazide 12.5 mg p.o. daily and we have reduce the rate of her normal saline +20 mg of KCl to 75 MLS per hour for 12 more hours  MGUS -Currently Inactive and Clinically stable  HOCM -Avoid fluid shifts perioperatively to the extent able -C/w HCTZ  DVT prophylaxis: SCDs Code Status: DO NOT RESUSCITATE  Family Communication: Discussed with Daughter at bedside  Disposition Plan: Pending further clinical improvement and clearance by General Surgery and Gastroenterology   Status is: Inpatient  Remains inpatient appropriate because:Unsafe d/c plan, IV treatments appropriate due to intensity of illness or inability to take PO and Inpatient level of care appropriate due to severity of illness   Dispo: The patient is from: Home              Anticipated d/c is to: TBD              Patient currently is not medically stable to d/c.   Difficult to place patient No  Consultants:   Gastroenterology  General Surgery    Procedures: None  Antimicrobials:  Anti-infectives (From admission, onward)   None         Subjective: Seen and examined at bedside and states that her abdominal pain is improved significantly.  No nausea or vomiting.  Feels better than she did when she came in.  No lightheadedness or dizziness.  No other concerns or complaints at this time and understands that general surgery wants to take her for surgical procedure on Friday.  Objective: Vitals:   10/13/20 2127 10/14/20 0212 10/14/20 0623 10/14/20 1345  BP: 133/68 132/66 128/67 (!) 170/88  Pulse: 61 60 65 80  Resp: 18 18 18 16   Temp: 98.5 F (36.9 C) 98.8 F (37.1 C) 97.6 F (36.4 C) 97.6 F (36.4 C)  TempSrc: Oral Oral Oral Oral  SpO2: 100% 96% 99% 100%  Weight:      Height:        Intake/Output Summary (Last 24 hours) at 10/14/2020 1717 Last data filed at 10/14/2020 1400 Gross per 24 hour  Intake 3139.8 ml  Output 1600 ml  Net 1539.8 ml   Filed Weights   10/13/20 0346  Weight: 76.2 kg   Examination: Physical Exam:  Constitutional: WN/WD overweight Caucasian female in NAD and appears  calm and comfortable Eyes: Lids and conjunctivae normal, sclerae anicteric  ENMT: External Ears, Nose appear normal. Grossly normal hearing.  Neck: Appears normal, supple, no cervical masses, normal ROM, no appreciable thyromegaly Respiratory: Clear to auscultation bilaterally, no wheezing, rales, rhonchi or crackles. Normal respiratory effort and patient is not tachypenic. No accessory muscle use.  Cardiovascular: Irregularly irregular, no murmurs / rubs / gallops. S1 and S2 auscultated.  Trace extremity edema Abdomen: Soft, very slightly-tender, distended secondary body habitus. Bowel sounds positive.  GU: Deferred. Musculoskeletal: No clubbing / cyanosis of digits/nails. No joint deformity upper and lower extremities. Good ROM, no contractures. Normal strength and muscle tone.  Skin: No rashes, lesions, ulcers on a limited skin evaluation. No induration; Warm and dry.  Neurologic: CN 2-12 grossly intact with no focal deficits.  Romberg sign and cerebellar reflexes not assessed.  Psychiatric: Normal judgment and insight. Alert and oriented x 3. Normal mood and appropriate affect.   Data Reviewed: I have personally reviewed following labs and imaging studies  CBC: Recent Labs  Lab 10/13/20 0345 10/14/20 0441  WBC 8.0 5.4  NEUTROABS 6.1  --   HGB 11.3* 10.2*  HCT 32.6* 30.6*  MCV 92.1 95.9  PLT 289 791   Basic Metabolic Panel: Recent Labs  Lab 10/13/20 0345 10/13/20 1817 10/14/20 0441  NA 130*  --  135  K 3.0* 4.0 4.4  CL 98  --  104  CO2 23  --  23  GLUCOSE 129*  --  102*  BUN 9  --  7*  CREATININE 0.65  --  0.70  CALCIUM 8.8*  --  8.7*   GFR: Estimated Creatinine Clearance: 57 mL/min (by C-G formula based on SCr of 0.7 mg/dL). Liver Function Tests: Recent Labs  Lab 10/13/20 0345 10/14/20 0441  AST 403* 224*  ALT 150* 179*  ALKPHOS 211* 256*  BILITOT 2.6* 3.7*  PROT 6.1* 5.2*  ALBUMIN 3.1* 2.6*   Recent Labs  Lab 10/13/20 0345  LIPASE 23   No results for input(s): AMMONIA in the last 168 hours. Coagulation Profile: Recent Labs  Lab 10/13/20 0345  INR 1.6*   Cardiac Enzymes: No results for input(s): CKTOTAL, CKMB, CKMBINDEX, TROPONINI in the last 168 hours. BNP (last 3 results) No results for input(s): PROBNP in the last 8760 hours. HbA1C: No results for input(s): HGBA1C in the last 72 hours. CBG: No results for input(s): GLUCAP in the last 168 hours. Lipid Profile: No results for input(s): CHOL, HDL, LDLCALC, TRIG, CHOLHDL, LDLDIRECT in the last 72 hours. Thyroid Function Tests: No results for input(s): TSH, T4TOTAL, FREET4, T3FREE, THYROIDAB in the last 72 hours. Anemia Panel: No results for input(s): VITAMINB12, FOLATE, FERRITIN, TIBC, IRON, RETICCTPCT in the last 72 hours. Sepsis Labs: Recent Labs  Lab 10/13/20 0345 10/13/20 0518  LATICACIDVEN 0.9 1.0    Recent Results (from the past 240 hour(s))  Blood Culture (routine x 2)     Status: None (Preliminary  result)   Collection Time: 10/13/20  3:45 AM   Specimen: BLOOD  Result Value Ref Range Status   Specimen Description   Final    BLOOD LEFT ANTECUBITAL Performed at [facility] [address] [place], [place] [address]    Special Requests   Final    BOTTLES DRAWN AEROBIC AND ANAEROBIC Blood Culture adequate volume Performed at [facility] [address] [place],  [address]    Culture   Final    NO GROWTH 1 DAY Performed  at [facility], [place] [address], [place], [place] [address]    Report Status PENDING  Incomplete  Blood Culture (routine x 2)     Status: None (Preliminary result)   Collection Time: 10/13/20  3:45 AM   Specimen: BLOOD  Result Value Ref Range Status   Specimen Description   Final    BLOOD BLOOD LEFT FOREARM Performed at [facility] [address], [place], [place] [address]    Special Requests   Final    BOTTLES DRAWN AEROBIC AND ANAEROBIC Blood Culture adequate volume Performed at [facility] [address], [place], [place] [address]    Culture   Final    NO GROWTH 1 DAY Performed at [facility], [place] [address] [place], [place] [address]    Report Status PENDING  Incomplete  SARS CORONAVIRUS 2 (TAT 6-24 HRS) Nasopharyngeal Nasopharyngeal Swab     Status: None   Collection Time: 10/13/20  8:00 AM   Specimen: Nasopharyngeal Swab  Result Value Ref Range Status   SARS Coronavirus 2 NEGATIVE NEGATIVE Final    Comment: (NOTE) SARS-CoV-2 target nucleic acids are NOT DETECTED.  The SARS-CoV-2 RNA is generally detectable in upper and lower respiratory specimens during the acute phase of infection. Negative results do not preclude SARS-CoV-2 infection, do not rule out co-infections with other pathogens, and should not be used as the sole basis for treatment or other patient management decisions. Negative results must be combined with clinical  observations, patient history, and epidemiological information. The expected result is Negative.  Fact Sheet for Patients: SugarRoll.be  Fact Sheet for Healthcare Providers: https://www.woods-mathews.com/  This test is not yet approved or cleared by the [place] FDA and  has been authorized for detection and/or diagnosis of SARS-CoV-2 by FDA under an Emergency Use Authorization (EUA). This EUA will remain  in effect (meaning this test can be used) for the duration of the COVID-19 declaration under Se ction 564(b)(1) of the Act, 21 U.S.C. section 360bbb-3(b)(1), unless the authorization is terminated or revoked sooner.  Performed at [facility], [place] [address], [place], [place] [address]     RN Pressure Injury Documentation:     Estimated body mass index is 28.84 kg/m as calculated from the following:   Height as of this encounter: 5\' 4"  (1.626 m).   Weight as of this encounter: 76.2 kg.  Malnutrition Type:   Malnutrition Characteristics:   Nutrition Interventions:     Radiology Studies: CT ABDOMEN PELVIS W CONTRAST  Result Date: 10/13/2020 CLINICAL DATA:  Dislodged cholecystostomy catheter, nausea, vomiting, right upper quadrant abdominal pain EXAM: CT ABDOMEN AND PELVIS WITH CONTRAST TECHNIQUE: Multidetector CT imaging of the abdomen and pelvis was performed using the standard protocol following bolus administration of intravenous contrast. CONTRAST:  163mL OMNIPAQUE IOHEXOL 300 MG/ML  SOLN COMPARISON:  09/05/2020 FINDINGS: Lower chest: The visualized lung bases are clear bilaterally. Cardiac size is mildly enlarged. There is bulbous dilation of the left ventricular apex suggestive of an aneurysm, not well assessed on this exam. Pacemaker leads are seen within the a right ventricle. Hepatobiliary: Cholelithiasis. The gallbladder wall is circumferentially thickened and there is moderate pericholecystic inflammatory change  identified. Trace amount of pericholecystic fluid seen. Punctate foci of gas within the gallbladder lumen are in keeping with the given history of recent catheterization. A a small catheter tract is seen within the right upper quadrant anterior abdominal wall related to the previously placed cholecystostomy catheter. No  loculated perihepatic fluid collection identified. Inflammatory changes extend into the sub attic space and abut the hepatic flexure of the colon. The liver is unremarkable. No intra or extrahepatic biliary ductal dilation. Pancreas: Unremarkable Spleen: Unremarkable Adrenals/Urinary Tract: The adrenal glands are unremarkable. The kidneys are normal. The bladder is unremarkable. Stomach/Bowel: Scattered descending and sigmoid colonic diverticulosis. The stomach, small bowel, and large bowel are otherwise unremarkable. Appendix normal. No evidence of obstruction or focal inflammation. No free intraperitoneal gas or fluid. Vascular/Lymphatic: Extensive aortoiliac atherosclerotic calcification. No aortic aneurysm. Particularly prominent atherosclerotic calcifications seen at the origin of the celiac axis. No pathologic adenopathy within the abdomen and pelvis. Reproductive: Status post hysterectomy. No adnexal masses. Other: Small fat containing left inguinal hernia. Rectum unremarkable. 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 Degenerative changes are seen within the lumbar spine. No acute bone abnormality. IMPRESSION: Cholelithiasis. Extensive pericholecystic inflammatory change is nonspecific and may simply represent changes related to reported chronic catheterization. Trace pericholecystic fluid identified without significant drainable perihepatic fluid collection. Inflammatory changes extend into the right subhepatic space and abut the hepatic flexure of the colon. No intra or extrahepatic biliary ductal dilation identified. Aortic  Atherosclerosis (ICD10-I70.0). Electronically Signed   By: Fidela Salisbury MD   On: 10/13/2020 05:56   DG Chest Port 1 View  Result Date: 10/13/2020 CLINICAL DATA:  Questionable sepsis EXAM: PORTABLE CHEST 1 VIEW COMPARISON:  Radiograph 09/02/2017, CT 06/03/2020 FINDINGS: No consolidation, features of edema, pneumothorax, or effusion. The aorta is calcified. Pacer pack overlies left chest wall with paired leads terminating at the cardiac apex in a configuration similar to prior counting for differences in positioning. Dense mitral annular calcifications are present. The remaining cardiomediastinal contours are unremarkable. No acute osseous or soft tissue abnormality. Degenerative changes are present in the imaged spine and shoulders. IMPRESSION: 1. No acute cardiopulmonary abnormality. 2. Calcification of the mitral annulus, consider outpatient assessment with echocardiography as warranted to assess for valvular dysfunction. 3.  Aortic Atherosclerosis (ICD10-I70.0). Electronically Signed   By: Lovena Le M.D.   On: 10/13/2020 03:21   Scheduled Meds: . hydrochlorothiazide  12.5 mg Oral Daily  . levothyroxine  125 mcg Oral Once per day on Mon Tue Wed Thu Fri Sat  . [START ON 10/18/2020] levothyroxine  62.5 mcg Oral Q Sun   Continuous Infusions: . 0.9 % NaCl with KCl 20 mEq / L 100 mL/hr at 10/14/20 1115    LOS: 0 days   Kerney Elbe, DO [facility] PAGER is on AMION  If 7PM-7AM, please contact night-coverage [email or web address]

## 2020-10-14 NOTE — Progress Notes (Signed)
CC: Abdominal pain  Subjective: No pain this a.m., Dr. Hassell Done is seen the patient tolerated plans to operate on her during this admission.  Objective: Vital signs in last 24 hours: Temp:  [97.5 F (36.4 C)-98.8 F (37.1 C)] 97.6 F (36.4 C) (03/09 0623) Pulse Rate:  [59-65] 65 (03/09 0623) Resp:  [16-18] 18 (03/09 0623) BP: (128-144)/(66-84) 128/67 (03/09 0623) SpO2:  [95 %-100 %] 99 % (03/09 0623) Last BM Date: 10/12/20 480 p.o. 1950 IV 1450 urine No BM recorded Afebrile, vital signs are stable. Labs: Potassium 4.4, alk phos 256, AST 224, ALT 179, bilirubin 3.7 CMP Latest Ref Rng & Units 10/14/2020 10/13/2020 10/13/2020  Total Bilirubin 0.3 - 1.2 mg/dL 3.7(H) - 2.6(H)  Alkaline Phos 38 - 126 U/L 256(H) - 211(H)  AST 15 - 41 U/L 224(H) - 403(H)  ALT 0 - 44 U/L 179(H) - 150(H)    Intake/Output from previous day: 03/08 0701 - 03/09 0700 In: 2430.8 [P.O.:480; I.V.:1841.7; IV Piggyback:109.2] Out: 1450 [Urine:1450] Intake/Output this shift: No intake/output data recorded.  General appearance: alert, cooperative and no distress Resp: clear to auscultation bilaterally GI: Soft, minimal tenderness over the right upper quadrant.  Lab Results:  Recent Labs    10/13/20 0345 10/14/20 0441  WBC 8.0 5.4  HGB 11.3* 10.2*  HCT 32.6* 30.6*  PLT 289 269    BMET Recent Labs    10/13/20 0345 10/13/20 1817 10/14/20 0441  NA 130*  --  135  K 3.0* 4.0 4.4  CL 98  --  104  CO2 23  --  23  GLUCOSE 129*  --  102*  BUN 9  --  7*  CREATININE 0.65  --  0.70  CALCIUM 8.8*  --  8.7*   PT/INR Recent Labs    10/13/20 0345  LABPROT 18.9*  INR 1.6*    Recent Labs  Lab 10/13/20 0345 10/14/20 0441  AST 403* 224*  ALT 150* 179*  ALKPHOS 211* 256*  BILITOT 2.6* 3.7*  PROT 6.1* 5.2*  ALBUMIN 3.1* 2.6*     Lipase     Component Value Date/Time   LIPASE 23 10/13/2020 0345     Medications: . hydrochlorothiazide  12.5 mg Oral Daily  . levothyroxine  125 mcg Oral  Once per day on Mon Tue Wed Thu Fri Sat  . [START ON 10/18/2020] levothyroxine  62.5 mcg Oral Q Sun    Assessment/Plan Atrial fibrillation on chronic anticoagulation last dose Eliquis p.m. 10/12/2020 Permanent transvenous pacemaker/almost 100% pacing Hypertrophic cardiomyopathy/moderate MI Cardiology consult 09/06/2020: Low to moderate risk, minimize time off anticoagulation Hypertension Hypothyroid Monoclonal gammopathy of unknown significance Hypokalemia Elevated LFTs  -T bilirubin 2.6>> 3.7 Decreased appetite/weight loss -16 pounds since 09/05/2020  Acute cholecystitis with IR drain placement 09/07/2020 IR drain removal 10/12/2020, with recurrent abdominal pain  -CT today shows extensive pericholecystic inflammatory changes nonspecific  - WBC 8.0>>5.4  FEN: Ice chips/IV fluids ID: None DVT: Eliquis last dose p.m. 10/12/2020/SCD  Plan: Clear liquids for now.  Will review with Dr. Hassell Done and discuss timing for surgery.        LOS: 0 days    Tracey Mcclure 10/14/2020 Please see Amion

## 2020-10-15 DIAGNOSIS — I4819 Other persistent atrial fibrillation: Secondary | ICD-10-CM | POA: Diagnosis not present

## 2020-10-15 DIAGNOSIS — K819 Cholecystitis, unspecified: Secondary | ICD-10-CM

## 2020-10-15 DIAGNOSIS — I421 Obstructive hypertrophic cardiomyopathy: Secondary | ICD-10-CM | POA: Diagnosis not present

## 2020-10-15 DIAGNOSIS — I10 Essential (primary) hypertension: Secondary | ICD-10-CM | POA: Diagnosis not present

## 2020-10-15 DIAGNOSIS — R7401 Elevation of levels of liver transaminase levels: Secondary | ICD-10-CM | POA: Diagnosis not present

## 2020-10-15 LAB — CBC WITH DIFFERENTIAL/PLATELET
Abs Immature Granulocytes: 0.02 10*3/uL (ref 0.00–0.07)
Basophils Absolute: 0 10*3/uL (ref 0.0–0.1)
Basophils Relative: 1 %
Eosinophils Absolute: 0.2 10*3/uL (ref 0.0–0.5)
Eosinophils Relative: 4 %
HCT: 29.3 % — ABNORMAL LOW (ref 36.0–46.0)
Hemoglobin: 9.8 g/dL — ABNORMAL LOW (ref 12.0–15.0)
Immature Granulocytes: 0 %
Lymphocytes Relative: 23 %
Lymphs Abs: 1.3 10*3/uL (ref 0.7–4.0)
MCH: 31.9 pg (ref 26.0–34.0)
MCHC: 33.4 g/dL (ref 30.0–36.0)
MCV: 95.4 fL (ref 80.0–100.0)
Monocytes Absolute: 0.4 10*3/uL (ref 0.1–1.0)
Monocytes Relative: 7 %
Neutro Abs: 3.6 10*3/uL (ref 1.7–7.7)
Neutrophils Relative %: 65 %
Platelets: 289 10*3/uL (ref 150–400)
RBC: 3.07 MIL/uL — ABNORMAL LOW (ref 3.87–5.11)
RDW: 13.2 % (ref 11.5–15.5)
WBC: 5.5 10*3/uL (ref 4.0–10.5)
nRBC: 0 % (ref 0.0–0.2)

## 2020-10-15 LAB — COMPREHENSIVE METABOLIC PANEL
ALT: 124 U/L — ABNORMAL HIGH (ref 0–44)
AST: 118 U/L — ABNORMAL HIGH (ref 15–41)
Albumin: 2.6 g/dL — ABNORMAL LOW (ref 3.5–5.0)
Alkaline Phosphatase: 221 U/L — ABNORMAL HIGH (ref 38–126)
Anion gap: 7 (ref 5–15)
BUN: 6 mg/dL — ABNORMAL LOW (ref 8–23)
CO2: 22 mmol/L (ref 22–32)
Calcium: 8.8 mg/dL — ABNORMAL LOW (ref 8.9–10.3)
Chloride: 107 mmol/L (ref 98–111)
Creatinine, Ser: 0.66 mg/dL (ref 0.44–1.00)
GFR, Estimated: >60 mL/min (ref >60)
Glucose, Bld: 99 mg/dL (ref 70–99)
Potassium: 3.8 mmol/L (ref 3.5–5.1)
Sodium: 136 mmol/L (ref 135–145)
Total Bilirubin: 1.7 mg/dL — ABNORMAL HIGH (ref 0.3–1.2)
Total Protein: 5.1 g/dL — ABNORMAL LOW (ref 6.5–8.1)

## 2020-10-15 LAB — MRSA PCR SCREENING: MRSA by PCR: NEGATIVE

## 2020-10-15 LAB — PHOSPHORUS: Phosphorus: 2.9 mg/dL (ref 2.5–4.6)

## 2020-10-15 LAB — MAGNESIUM: Magnesium: 1.5 mg/dL — ABNORMAL LOW (ref 1.7–2.4)

## 2020-10-15 MED ORDER — SODIUM CHLORIDE 0.9 % IV SOLN
2.0000 g | INTRAVENOUS | Status: DC
  Administered 2020-10-15 – 2020-10-16 (×2): 2 g via INTRAVENOUS
  Filled 2020-10-15: qty 2
  Filled 2020-10-15: qty 20
  Filled 2020-10-15: qty 2

## 2020-10-15 MED ORDER — MAGNESIUM SULFATE 4 GM/100ML IV SOLN
4.0000 g | Freq: Once | INTRAVENOUS | Status: AC
  Administered 2020-10-15: 4 g via INTRAVENOUS
  Filled 2020-10-15: qty 100

## 2020-10-15 NOTE — Progress Notes (Signed)
    CC: Abdominal pain  Subjective: She is sitting up in the chair and looks quite comfortable.  No abdominal pain.  Waiting on surgery.  Objective: Vital signs in last 24 hours: Temp:  [97.6 F (36.4 C)-98.1 F (36.7 C)] 98.1 F (36.7 C) (03/09 2115) Pulse Rate:  [61-80] 61 (03/10 0548) Resp:  [16] 16 (03/10 0548) BP: (129-170)/(60-88) 129/60 (03/10 0548) SpO2:  [97 %-100 %] 97 % (03/10 0548) Last BM Date: 10/13/20 Afebrile, vital signs are stable Creatinine 0.66, alk phos 221, AST 118, ALT 124, total bilirubin 1.7 WBC 5.5 H/H 9.8/29.3 CMP Latest Ref Rng & Units 10/15/2020 10/14/2020 10/13/2020  Calcium 8.9 - 10.3 mg/dL 8.8(L) 8.7(L) -  Total Protein 6.5 - 8.1 g/dL 5.1(L) 5.2(L) -  Total Bilirubin 0.3 - 1.2 mg/dL 1.7(H) 3.7(H) -  Alkaline Phos 38 - 126 U/L 221(H) 256(H) -  AST 15 - 41 U/L 118(H) 224(H) -  ALT 0 - 44 U/L 124(H) 179(H) -   Intake/Output from previous day: 03/09 0701 - 03/10 0700 In: 2867.6 [P.O.:1160; I.V.:1707.6] Out: 2100 [Urine:2100] Intake/Output this shift: Total I/O In: 0  Out: 600 [Urine:600]  General appearance: alert, cooperative and no distress Resp: clear to auscultation bilaterally GI: Soft minimal tenderness right upper quadrant.  No other complaints.  Lab Results:  Recent Labs    10/14/20 0441 10/15/20 0503  WBC 5.4 5.5  HGB 10.2* 9.8*  HCT 30.6* 29.3*  PLT 269 289    BMET Recent Labs    10/14/20 0441 10/15/20 0503  NA 135 136  K 4.4 3.8  CL 104 107  CO2 23 22  GLUCOSE 102* 99  BUN 7* 6*  CREATININE 0.70 0.66  CALCIUM 8.7* 8.8*   PT/INR Recent Labs    10/13/20 0345  LABPROT 18.9*  INR 1.6*    Recent Labs  Lab 10/13/20 0345 10/14/20 0441 10/15/20 0503  AST 403* 224* 118*  ALT 150* 179* 124*  ALKPHOS 211* 256* 221*  BILITOT 2.6* 3.7* 1.7*  PROT 6.1* 5.2* 5.1*  ALBUMIN 3.1* 2.6* 2.6*     Lipase     Component Value Date/Time   LIPASE 23 10/13/2020 0345     Medications: . hydrochlorothiazide  12.5 mg  Oral Daily  . levothyroxine  125 mcg Oral Once per day on Mon Tue Wed Thu Fri Sat  . [START ON 10/18/2020] levothyroxine  62.5 mcg Oral Q Sun    Assessment/Plan Atrial fibrillation on chronic anticoagulation last dose Eliquis p.m. 10/12/2020 Permanent transvenous pacemaker/almost 100% pacing Hypertrophic cardiomyopathy/moderate MI Cardiology consult 09/06/2020: Low to moderate risk, minimize time off anticoagulation Hypertension Hypothyroid Monoclonal gammopathy of unknown significance Hypokalemia Elevated LFTs  -T bilirubin 2.6>> 3.7>>1.7 Decreased appetite/weight loss-16 pounds since 09/05/2020  Acute cholecystitis with IR drain placement 09/07/2020 IR drain removal 10/12/2020, with recurrent abdominal pain -CT today shows extensive pericholecystic inflammatory changes nonspecific  - WBC 8.0>>5.4>>5.5  FEN: clear liquids/IV fluids ID: None DVT: Eliquis last dose p.m. 10/12/2020/SCD   Plan: Laparoscopic cholecystectomy with intraoperative cholangiogram tomorrow.  Dr. Hassell Done has talked with the patient and her daughter extensively about the risk and benefits of surgery at this time.      LOS: 1 day    JENNINGS,WILLARD 10/15/2020 Please see Amion

## 2020-10-15 NOTE — Progress Notes (Signed)
Improvement in LFTs noted.  Perhaps she passed a common duct stone or one is ball valving, or (more likely) she is having reduced inflammation from antibiotic therapy for "cooling down" the cholecystitis.  Plans for surgery tomorrow noted.  Cleotis Nipper, M.D. Pager [phone] If no answer or after 5 PM call [phone]

## 2020-10-15 NOTE — Progress Notes (Signed)
PROGRESS NOTE    Tracey Mcclure  WUJ:[id number] DOB: 15-Jul-1941 DOA: 10/13/2020 PCP: Crist Infante, MD   Brief Narrative:  The patient is a a 80 year old overweight Caucasian female with a past medical history significant for but not limited to permanent atrial fibrillation and sick sinus syndrome with a pacemaker in place, hokum, hypertension, MGUS, hypothyroidism, as well as other comorbidities who had a recent cholecystitis and presented back to the hospital with abdominal pain, nausea and malaise.  She had been admitted 6 weeks ago with cholecystitis and treated with antibiotics and had a PERC drain in place.  She is doing well and was active and eating a normal diet until the day before admission.  She went to the IR clinic for drain evaluation and cholangiogram but it obtained and is determined that the existing tube was malpositioned but in trying to reposition it access was lost and attempt to replace the tube failed.  The few hours of the procedure patient was sent home and started to have epigastric pain, nausea and subjective fevers.  This persisted and was so severe that she came to the ER for further evaluation.  In the ER she was noted to be afebrile with a normal white blood cell count and had a CT of the abdomen pelvis done which showed no fluid collection and had no findings of chronic cholecystitis.  She did have abnormal LFTs so both general surgery and gastroenterology were both consulted for further evaluation recommendations.  Gastroenterology feels that her it would be difficult to get a MRCP as the patient has a pacemaker and they are recommending proceeding with a cholecystectomy with an [place] currently her Eliquis is on hold.  She is on a clear liquid diet for now and general surgery planning on taking the patient for surgical intervention likely on Friday, 10/16/2020.  GI recommends following up with the [facility] and does not recommend up preoperative ERCP or EUS at this time and they have  signed off the case now.  Assessment & Plan:   Principal Problem:   Transaminitis Active Problems:   Hypertrophic obstructive cardiomyopathy (HCC)   HTN (hypertension)   Hypothyroidism   MGUS (monoclonal gammopathy of unknown significance)   Atrial fibrillation (HCC)   Pacemaker   Hypokalemia   Hyponatremia   Choledocholithiasis  Abnormal LFT's/Transaminitis improving Hyperbilirubinemia, improving -No CBD dilation on CT.  Other diagnostic considerations include Augmentin induced cholestasis.   -Of note, the stone was described as >3cm on imaging last hospitalization. -Continue to Hold statin -Trend LFTs and AST went from 403 -> 224 -> 118 and ALT went from 150 -> 179 -> 124 -T Bili worsened and went from 2.6 -> 3.7 -> 1.7 -GI and General Surgery Consulted -Consulted GI re: MRCP vs ERCP; if MRCP needed, would need transfer to [facility] for MR compatible pacer monitoring and I spoke with Dr. Cristina Gong personally who feels that she does not need an MRCP given her pacer and does not feel that she needs a perioperative ERCP or EUS at this time and recommend proceeding with a surgery and intraoperative cholangiogram at that is positive then at that time they were doing ERCP -Plan is for a laparoscopic cholecystectomy with intraoperative cholangiogram tomorrow 10/16/2020  Acute on chronic cholecystitis -PERC Chole drain removed.  No WBC or fever here, I do not think empiric antibiotics are needed yet we will continue to hold off unless general surgery feels otherwise -Monitor WBC and fever curve -Initially was going to consult  IR to replace drain (last dose Eliquis 3/7 in PM) however she did not undergo a cholecystectomy on Friday so we do not feel the need to reconsult IR -Follow blood cultures x2; they showed no growth to date less than 1 day -Further care per general surgery and she will require liquid diet for now -Will need PT/OT to evaluate and Treat and this can be done after her  surgery  Hyponatremia -Asymptomatic. -Sodium has improved and gone from 130 is now 136 today -Getting IV fluid hydration with normal saline +20 mEq of KCl and will reduce the rate from 100 to 75 mL's per hour and will hold off any further fluid hydration now -Continue to monitor and trend and repeat CMP in a.m.  Hypokalemia -Improved as patient's potassium level of 3.0 is now 3.8 today -Continue to monitor and replete as necessary -IV fluid hydration is now stopped -Repeat CMP in a.m. -Mag level was 1.5 this morning  Hypomagnesemia -Patient's magnesium level is 1.5 -Replete with IV mag sulfate 3 g -Continue to monitor and replete as necessary -Repeat magnesium level in a.m.  Hypothyroidism -Continue Levothyroxine with 125 mcg p.o. Monday Tuesday Wednesday Thursday Friday Saturday and 62.5 mcg p.o. every Sunday -Check TSH in the AM   Permanent Atrial Fibrillation -CHA2DS2-Vasc 4.  Given that low risk profile, bridging anticoagulation will more likely raise her risk of perioperative bleeding, and is unlikely to prevent a thromboembolic event (Douketsis Charolette Child 2015, Ayoub J Stroke Cerebrovasc Dis 2016). -Her rates are controlled -Continue to Hold apixaban given that she can go for surgical procedure tomorrow  Essential Hypertension -Continue extremely low-dose hydrochlorothiazide 12.5 mg p.o. daily -IV fluid hydration is now stopped -Continue to monitor blood pressures per protocol -Last blood pressure reading was 124/60  MGUS -Currently Inactive and Clinically stable  HOCM -Avoid fluid shifts perioperatively to the extent able -C/w HCTZ  DVT prophylaxis: SCDs Code Status: DO NOT RESUSCITATE  Family Communication no family present at bedside today Disposition Plan: Pending further clinical improvement and clearance by General Surgery and Gastroenterology   Status is: Inpatient  Remains inpatient appropriate because:Unsafe d/c plan, IV treatments  appropriate due to intensity of illness or inability to take PO and Inpatient level of care appropriate due to severity of illness   Dispo: The patient is from: Home              Anticipated d/c is to: TBD              Patient currently is not medically stable to d/c.   Difficult to place patient No  Consultants:   Gastroenterology  General Surgery    Procedures: None will be going for a laparoscopic cholecystectomy with intraoperative cholangiogram tomorrow  Antimicrobials:  Anti-infectives (From admission, onward)   Start     Dose/Rate Route Frequency Ordered Stop   10/15/20 1200  cefTRIAXone (ROCEPHIN) 2 g in sodium chloride 0.9 % 100 mL IVPB        2 g 200 mL/hr over 30 Minutes Intravenous Every 24 hours 10/15/20 0948          Subjective: Seen and examined at bedside and she is feeling well and denies any pain.  No nausea or vomiting.  Frustrated that she cannot get surgery today.  Denies any lightheadedness or dizziness.  No other concerns or complaints at this time.  Objective: Vitals:   10/14/20 0623 10/14/20 1345 10/14/20 2115 10/15/20 0548  BP: 128/67 (!) 170/88 (!) 155/71 129/60  Pulse:  65 80 63 61  Resp: 18 16 16 16   Temp: 97.6 F (36.4 C) 97.6 F (36.4 C) 98.1 F (36.7 C)   TempSrc: Oral Oral Oral   SpO2: 99% 100% 98% 97%  Weight:      Height:        Intake/Output Summary (Last 24 hours) at 10/15/2020 1044 Last data filed at 10/15/2020 0932 Gross per 24 hour  Intake 2867.56 ml  Output 2450 ml  Net 417.56 ml   Filed Weights   10/13/20 0346  Weight: 76.2 kg   Examination: Physical Exam:  Constitutional: WN/WD overweight Caucasian female currently no acute distress appears calm and is comfortable appearing.   Eyes: Lids and conjunctivae normal, sclerae anicteric  ENMT: External Ears, Nose appear normal. Grossly normal hearing. Neck: Appears normal, supple, no cervical masses, normal ROM, no appreciable thyromegaly; no JVD Respiratory: Diminished  to auscultation bilaterally, no wheezing, rales, rhonchi or crackles. Normal respiratory effort and patient is not tachypenic. No accessory muscle use.  Unlabored breathing Cardiovascular: Irregularly irregular, no murmurs / rubs / gallops. S1 and S2 auscultated.  Very minimal extremity edema Abdomen: Soft, non-tender, mildly distended secondary to body habitus. Bowel sounds positive.  GU: Deferred. Musculoskeletal: No clubbing / cyanosis of digits/nails. No joint deformity upper and lower extremities.  Skin: No rashes, lesions, ulcers on limited skin evaluation. No induration; Warm and dry.  Neurologic: CN 2-12 grossly intact with no focal deficits. Romberg sign and cerebellar reflexes not assessed.  Psychiatric: Normal judgment and insight. Alert and oriented x 3. Normal mood and appropriate affect.   Data Reviewed: I have personally reviewed following labs and imaging studies  CBC: Recent Labs  Lab 10/13/20 0345 10/14/20 0441 10/15/20 0503  WBC 8.0 5.4 5.5  NEUTROABS 6.1  --  3.6  HGB 11.3* 10.2* 9.8*  HCT 32.6* 30.6* 29.3*  MCV 92.1 95.9 95.4  PLT 289 269 825   Basic Metabolic Panel: Recent Labs  Lab 10/13/20 0345 10/13/20 1817 10/14/20 0441 10/15/20 0503  NA 130*  --  135 136  K 3.0* 4.0 4.4 3.8  CL 98  --  104 107  CO2 23  --  23 22  GLUCOSE 129*  --  102* 99  BUN 9  --  7* 6*  CREATININE 0.65  --  0.70 0.66  CALCIUM 8.8*  --  8.7* 8.8*  MG  --   --   --  1.5*  PHOS  --   --   --  2.9   GFR: Estimated Creatinine Clearance: 57 mL/min (by C-G formula based on SCr of 0.66 mg/dL). Liver Function Tests: Recent Labs  Lab 10/13/20 0345 10/14/20 0441 10/15/20 0503  AST 403* 224* 118*  ALT 150* 179* 124*  ALKPHOS 211* 256* 221*  BILITOT 2.6* 3.7* 1.7*  PROT 6.1* 5.2* 5.1*  ALBUMIN 3.1* 2.6* 2.6*   Recent Labs  Lab 10/13/20 0345  LIPASE 23   No results for input(s): AMMONIA in the last 168 hours. Coagulation Profile: Recent Labs  Lab 10/13/20 0345  INR  1.6*   Cardiac Enzymes: No results for input(s): CKTOTAL, CKMB, CKMBINDEX, TROPONINI in the last 168 hours. BNP (last 3 results) No results for input(s): PROBNP in the last 8760 hours. HbA1C: No results for input(s): HGBA1C in the last 72 hours. CBG: No results for input(s): GLUCAP in the last 168 hours. Lipid Profile: No results for input(s): CHOL, HDL, LDLCALC, TRIG, CHOLHDL, LDLDIRECT in the last 72 hours. Thyroid Function Tests: No results  for input(s): TSH, T4TOTAL, FREET4, T3FREE, THYROIDAB in the last 72 hours. Anemia Panel: No results for input(s): VITAMINB12, FOLATE, FERRITIN, TIBC, IRON, RETICCTPCT in the last 72 hours. Sepsis Labs: Recent Labs  Lab 10/13/20 0345 10/13/20 0518  LATICACIDVEN 0.9 1.0    Recent Results (from the past 240 hour(s))  Blood Culture (routine x 2)     Status: None (Preliminary result)   Collection Time: 10/13/20  3:45 AM   Specimen: BLOOD  Result Value Ref Range Status   Specimen Description   Final    BLOOD LEFT ANTECUBITAL Performed at [facility] [address], [place], [place] [address]    Special Requests   Final    BOTTLES DRAWN AEROBIC AND ANAEROBIC Blood Culture adequate volume Performed at [facility] [address], [place], [place] [address]    Culture   Final    NO GROWTH 1 DAY Performed at [facility], [place] [address], [place], [place] [address]    Report Status PENDING  Incomplete  Blood Culture (routine x 2)     Status: None (Preliminary result)   Collection Time: 10/13/20  3:45 AM   Specimen: BLOOD  Result Value Ref Range Status   Specimen Description   Final    BLOOD BLOOD LEFT FOREARM Performed at [facility] [address] [place], [place] [address]    Special Requests   Final    BOTTLES DRAWN AEROBIC AND ANAEROBIC Blood Culture adequate volume Performed at [facility] [address], [place], [place] [address]     Culture   Final    NO GROWTH 1 DAY Performed at [facility], [place] [address], [place], [place] [address]    Report Status PENDING  Incomplete  SARS CORONAVIRUS 2 (TAT 6-24 HRS) Nasopharyngeal Nasopharyngeal Swab     Status: None   Collection Time: 10/13/20  8:00 AM   Specimen: Nasopharyngeal Swab  Result Value Ref Range Status   SARS Coronavirus 2 NEGATIVE NEGATIVE Final    Comment: (NOTE) SARS-CoV-2 target nucleic acids are NOT DETECTED.  The SARS-CoV-2 RNA is generally detectable in upper and lower respiratory specimens during the acute phase of infection. Negative results do not preclude SARS-CoV-2 infection, do not rule out co-infections with other pathogens, and should not be used as the sole basis for treatment or other patient management decisions. Negative results must be combined with clinical observations, patient history, and epidemiological information. The expected result is Negative.  Fact Sheet for Patients: SugarRoll.be  Fact Sheet for Healthcare Providers: https://www.woods-mathews.com/  This test is not yet approved or cleared by the [place] FDA and  has been authorized for detection and/or diagnosis of SARS-CoV-2 by FDA under an Emergency Use Authorization (EUA). This EUA will remain  in effect (meaning this test can be used) for the duration of the COVID-19 declaration under Se ction 564(b)(1) of the Act, 21 U.S.C. section 360bbb-3(b)(1), unless the authorization is terminated or revoked sooner.  Performed at [facility], [place] [address], [place], [place] [address]   MRSA PCR Screening     Status: None   Collection Time: 10/15/20  6:01 AM   Specimen: Nasal Mucosa; Nasopharyngeal  Result Value Ref Range Status   MRSA by PCR NEGATIVE NEGATIVE Final    Comment:        The GeneXpert MRSA Assay (FDA approved for NASAL specimens only), is one component of a comprehensive MRSA  colonization surveillance program.  It is not intended to diagnose MRSA infection nor to guide or monitor treatment for MRSA infections. Performed at [facility], [place] [address] [place], [place] [address]     RN Pressure Injury Documentation:     Estimated body mass index is 28.84 kg/m as calculated from the following:   Height as of this encounter: 5\' 4"  (1.626 m).   Weight as of this encounter: 76.2 kg.  Malnutrition Type:   Malnutrition Characteristics:   Nutrition Interventions:     Radiology Studies: No results found. Scheduled Meds: . hydrochlorothiazide  12.5 mg Oral Daily  . levothyroxine  125 mcg Oral Once per day on Mon Tue Wed Thu Fri Sat  . [START ON 10/18/2020] levothyroxine  62.5 mcg Oral Q Sun   Continuous Infusions: . cefTRIAXone (ROCEPHIN)  IV    . magnesium sulfate bolus IVPB 4 g (10/15/20 0847)    LOS: 1 day   Kerney Elbe, DO [facility] PAGER is on [place]  If 7PM-7AM, please contact night-coverage [email or web address]

## 2020-10-15 NOTE — H&P (View-Only) (Signed)
    CC: Abdominal pain  Subjective: She is sitting up in the chair and looks quite comfortable.  No abdominal pain.  Waiting on surgery.  Objective: Vital signs in last 24 hours: Temp:  [97.6 F (36.4 C)-98.1 F (36.7 C)] 98.1 F (36.7 C) (03/09 2115) Pulse Rate:  [61-80] 61 (03/10 0548) Resp:  [16] 16 (03/10 0548) BP: (129-170)/(60-88) 129/60 (03/10 0548) SpO2:  [97 %-100 %] 97 % (03/10 0548) Last BM Date: 10/13/20 Afebrile, vital signs are stable Creatinine 0.66, alk phos 221, AST 118, ALT 124, total bilirubin 1.7 WBC 5.5 H/H 9.8/29.3 CMP Latest Ref Rng & Units 10/15/2020 10/14/2020 10/13/2020  Calcium 8.9 - 10.3 mg/dL 8.8(L) 8.7(L) -  Total Protein 6.5 - 8.1 g/dL 5.1(L) 5.2(L) -  Total Bilirubin 0.3 - 1.2 mg/dL 1.7(H) 3.7(H) -  Alkaline Phos 38 - 126 U/L 221(H) 256(H) -  AST 15 - 41 U/L 118(H) 224(H) -  ALT 0 - 44 U/L 124(H) 179(H) -   Intake/Output from previous day: 03/09 0701 - 03/10 0700 In: 2867.6 [P.O.:1160; I.V.:1707.6] Out: 2100 [Urine:2100] Intake/Output this shift: Total I/O In: 0  Out: 600 [Urine:600]  General appearance: alert, cooperative and no distress Resp: clear to auscultation bilaterally GI: Soft minimal tenderness right upper quadrant.  No other complaints.  Lab Results:  Recent Labs    10/14/20 0441 10/15/20 0503  WBC 5.4 5.5  HGB 10.2* 9.8*  HCT 30.6* 29.3*  PLT 269 289    BMET Recent Labs    10/14/20 0441 10/15/20 0503  NA 135 136  K 4.4 3.8  CL 104 107  CO2 23 22  GLUCOSE 102* 99  BUN 7* 6*  CREATININE 0.70 0.66  CALCIUM 8.7* 8.8*   PT/INR Recent Labs    10/13/20 0345  LABPROT 18.9*  INR 1.6*    Recent Labs  Lab 10/13/20 0345 10/14/20 0441 10/15/20 0503  AST 403* 224* 118*  ALT 150* 179* 124*  ALKPHOS 211* 256* 221*  BILITOT 2.6* 3.7* 1.7*  PROT 6.1* 5.2* 5.1*  ALBUMIN 3.1* 2.6* 2.6*     Lipase     Component Value Date/Time   LIPASE 23 10/13/2020 0345     Medications: . hydrochlorothiazide  12.5 mg  Oral Daily  . levothyroxine  125 mcg Oral Once per day on Mon Tue Wed Thu Fri Sat  . [START ON 10/18/2020] levothyroxine  62.5 mcg Oral Q Sun    Assessment/Plan Atrial fibrillation on chronic anticoagulation last dose Eliquis p.m. 10/12/2020 Permanent transvenous pacemaker/almost 100% pacing Hypertrophic cardiomyopathy/moderate MI Cardiology consult 09/06/2020: Low to moderate risk, minimize time off anticoagulation Hypertension Hypothyroid Monoclonal gammopathy of unknown significance Hypokalemia Elevated LFTs  -T bilirubin 2.6>> 3.7>>1.7 Decreased appetite/weight loss-16 pounds since 09/05/2020  Acute cholecystitis with IR drain placement 09/07/2020 IR drain removal 10/12/2020, with recurrent abdominal pain -CT today shows extensive pericholecystic inflammatory changes nonspecific  - WBC 8.0>>5.4>>5.5  FEN: clear liquids/IV fluids ID: None DVT: Eliquis last dose p.m. 10/12/2020/SCD   Plan: Laparoscopic cholecystectomy with intraoperative cholangiogram tomorrow.  Dr. Hassell Done has talked with the patient and her daughter extensively about the risk and benefits of surgery at this time.      LOS: 1 day    Tracey Mcclure 10/15/2020 Please see Amion

## 2020-10-16 ENCOUNTER — Encounter (HOSPITAL_COMMUNITY): Payer: Self-pay | Admitting: Family Medicine

## 2020-10-16 ENCOUNTER — Inpatient Hospital Stay (HOSPITAL_COMMUNITY): Payer: Medicare Other

## 2020-10-16 ENCOUNTER — Inpatient Hospital Stay (HOSPITAL_COMMUNITY): Payer: Medicare Other | Admitting: Certified Registered Nurse Anesthetist

## 2020-10-16 ENCOUNTER — Encounter (HOSPITAL_COMMUNITY)
Admission: EM | Disposition: A | Discharge status: Home / Self Care | Payer: Self-pay | Source: Home / Self Care | Attending: Internal Medicine

## 2020-10-16 DIAGNOSIS — I421 Obstructive hypertrophic cardiomyopathy: Secondary | ICD-10-CM | POA: Diagnosis not present

## 2020-10-16 DIAGNOSIS — R7401 Elevation of levels of liver transaminase levels: Secondary | ICD-10-CM | POA: Diagnosis not present

## 2020-10-16 DIAGNOSIS — I10 Essential (primary) hypertension: Secondary | ICD-10-CM | POA: Diagnosis not present

## 2020-10-16 DIAGNOSIS — I4819 Other persistent atrial fibrillation: Secondary | ICD-10-CM | POA: Diagnosis not present

## 2020-10-16 HISTORY — PX: CHOLECYSTECTOMY: SHX55

## 2020-10-16 LAB — CBC WITH DIFFERENTIAL/PLATELET
Abs Immature Granulocytes: 0.02 10*3/uL (ref 0.00–0.07)
Basophils Absolute: 0.1 10*3/uL (ref 0.0–0.1)
Basophils Relative: 1 %
Eosinophils Absolute: 0.3 10*3/uL (ref 0.0–0.5)
Eosinophils Relative: 5 %
HCT: 30.6 % — ABNORMAL LOW (ref 36.0–46.0)
Hemoglobin: 10.4 g/dL — ABNORMAL LOW (ref 12.0–15.0)
Immature Granulocytes: 0 %
Lymphocytes Relative: 28 %
Lymphs Abs: 1.5 10*3/uL (ref 0.7–4.0)
MCH: 31.6 pg (ref 26.0–34.0)
MCHC: 34 g/dL (ref 30.0–36.0)
MCV: 93 fL (ref 80.0–100.0)
Monocytes Absolute: 0.4 10*3/uL (ref 0.1–1.0)
Monocytes Relative: 8 %
Neutro Abs: 3.2 10*3/uL (ref 1.7–7.7)
Neutrophils Relative %: 58 %
Platelets: 321 10*3/uL (ref 150–400)
RBC: 3.29 MIL/uL — ABNORMAL LOW (ref 3.87–5.11)
RDW: 13.2 % (ref 11.5–15.5)
WBC: 5.5 10*3/uL (ref 4.0–10.5)
nRBC: 0 % (ref 0.0–0.2)

## 2020-10-16 LAB — COMPREHENSIVE METABOLIC PANEL
ALT: 93 U/L — ABNORMAL HIGH (ref 0–44)
AST: 71 U/L — ABNORMAL HIGH (ref 15–41)
Albumin: 2.7 g/dL — ABNORMAL LOW (ref 3.5–5.0)
Alkaline Phosphatase: 205 U/L — ABNORMAL HIGH (ref 38–126)
Anion gap: 8 (ref 5–15)
BUN: <5 mg/dL — ABNORMAL LOW (ref 8–23)
CO2: 24 mmol/L (ref 22–32)
Calcium: 8.8 mg/dL — ABNORMAL LOW (ref 8.9–10.3)
Chloride: 103 mmol/L (ref 98–111)
Creatinine, Ser: 0.7 mg/dL (ref 0.44–1.00)
GFR, Estimated: >60 mL/min (ref >60)
Glucose, Bld: 98 mg/dL (ref 70–99)
Potassium: 3.7 mmol/L (ref 3.5–5.1)
Sodium: 135 mmol/L (ref 135–145)
Total Bilirubin: 1.4 mg/dL — ABNORMAL HIGH (ref 0.3–1.2)
Total Protein: 5.4 g/dL — ABNORMAL LOW (ref 6.5–8.1)

## 2020-10-16 LAB — TSH: TSH: 8.431 u[IU]/mL — ABNORMAL HIGH (ref 0.350–4.500)

## 2020-10-16 LAB — MAGNESIUM: Magnesium: 1.9 mg/dL (ref 1.7–2.4)

## 2020-10-16 LAB — PHOSPHORUS: Phosphorus: 3.6 mg/dL (ref 2.5–4.6)

## 2020-10-16 SURGERY — LAPAROSCOPIC CHOLECYSTECTOMY WITH INTRAOPERATIVE CHOLANGIOGRAM
Anesthesia: General | Site: Abdomen

## 2020-10-16 MED ORDER — FENTANYL CITRATE (PF) 100 MCG/2ML IJ SOLN
25.0000 ug | INTRAMUSCULAR | Status: DC | PRN
  Administered 2020-10-16 (×3): 50 ug via INTRAVENOUS

## 2020-10-16 MED ORDER — FENTANYL CITRATE (PF) 100 MCG/2ML IJ SOLN
INTRAMUSCULAR | Status: AC
  Filled 2020-10-16: qty 2

## 2020-10-16 MED ORDER — ROCURONIUM BROMIDE 10 MG/ML (PF) SYRINGE
PREFILLED_SYRINGE | INTRAVENOUS | Status: AC
  Filled 2020-10-16: qty 10

## 2020-10-16 MED ORDER — RINGERS IRRIGATION IR SOLN
Status: DC | PRN
  Administered 2020-10-16: 1

## 2020-10-16 MED ORDER — ONDANSETRON HCL 4 MG/2ML IJ SOLN
INTRAMUSCULAR | Status: DC | PRN
  Administered 2020-10-16: 4 mg via INTRAVENOUS

## 2020-10-16 MED ORDER — PHENYLEPHRINE HCL-NACL 10-0.9 MG/250ML-% IV SOLN
INTRAVENOUS | Status: DC | PRN
  Administered 2020-10-16: 50 ug/min via INTRAVENOUS

## 2020-10-16 MED ORDER — BUPIVACAINE LIPOSOME 1.3 % IJ SUSP
20.0000 mL | Freq: Once | INTRAMUSCULAR | Status: AC
  Administered 2020-10-16: 14:00:00 20 mL
  Filled 2020-10-16: qty 20

## 2020-10-16 MED ORDER — FENTANYL CITRATE (PF) 100 MCG/2ML IJ SOLN
INTRAMUSCULAR | Status: DC | PRN
  Administered 2020-10-16: 50 ug via INTRAVENOUS
  Administered 2020-10-16: 25 ug via INTRAVENOUS
  Administered 2020-10-16 (×2): 50 ug via INTRAVENOUS
  Administered 2020-10-16: 25 ug via INTRAVENOUS

## 2020-10-16 MED ORDER — ACETAMINOPHEN 10 MG/ML IV SOLN: 1000.0000 mg | Freq: Once | INTRAVENOUS | Status: DC | PRN

## 2020-10-16 MED ORDER — ROCURONIUM BROMIDE 10 MG/ML (PF) SYRINGE
PREFILLED_SYRINGE | INTRAVENOUS | Status: DC | PRN
  Administered 2020-10-16: 60 mg via INTRAVENOUS
  Administered 2020-10-16 (×2): 10 mg via INTRAVENOUS

## 2020-10-16 MED ORDER — PROPOFOL 10 MG/ML IV BOLUS
INTRAVENOUS | Status: AC
  Filled 2020-10-16: qty 20

## 2020-10-16 MED ORDER — DEXAMETHASONE SODIUM PHOSPHATE 10 MG/ML IJ SOLN
INTRAMUSCULAR | Status: AC
  Filled 2020-10-16: qty 1

## 2020-10-16 MED ORDER — PROPOFOL 10 MG/ML IV BOLUS
INTRAVENOUS | Status: DC | PRN
  Administered 2020-10-16: 200 mg via INTRAVENOUS

## 2020-10-16 MED ORDER — LACTATED RINGERS IV SOLN: INTRAVENOUS | Status: DC

## 2020-10-16 MED ORDER — LIDOCAINE 2% (20 MG/ML) 5 ML SYRINGE
INTRAMUSCULAR | Status: DC | PRN
  Administered 2020-10-16: 60 mg via INTRAVENOUS

## 2020-10-16 MED ORDER — PROPOFOL 500 MG/50ML IV EMUL
INTRAVENOUS | Status: DC | PRN
  Administered 2020-10-16: 15 ug/kg/min via INTRAVENOUS

## 2020-10-16 MED ORDER — IOPAMIDOL (ISOVUE-300) INJECTION 61%
INTRAVENOUS | Status: DC | PRN
  Administered 2020-10-16: 9 mL

## 2020-10-16 MED ORDER — SODIUM CHLORIDE 0.9 % IV SOLN
2.0000 g | INTRAVENOUS | Status: DC
  Administered 2020-10-17: 2 g via INTRAVENOUS
  Filled 2020-10-16: qty 2

## 2020-10-16 MED ORDER — FENTANYL CITRATE (PF) 250 MCG/5ML IJ SOLN
INTRAMUSCULAR | Status: AC
  Filled 2020-10-16: qty 5

## 2020-10-16 MED ORDER — PHENYLEPHRINE 40 MCG/ML (10ML) SYRINGE FOR IV PUSH (FOR BLOOD PRESSURE SUPPORT)
PREFILLED_SYRINGE | INTRAVENOUS | Status: DC | PRN
  Administered 2020-10-16: 80 ug via INTRAVENOUS

## 2020-10-16 MED ORDER — ONDANSETRON HCL 4 MG/2ML IJ SOLN: 4.0000 mg | Freq: Once | INTRAMUSCULAR | Status: DC | PRN

## 2020-10-16 MED ORDER — CHLORHEXIDINE GLUCONATE 0.12 % MT SOLN
15.0000 mL | Freq: Once | OROMUCOSAL | Status: AC
  Administered 2020-10-16: 15 mL via OROMUCOSAL

## 2020-10-16 MED ORDER — 0.9 % SODIUM CHLORIDE (POUR BTL) OPTIME
TOPICAL | Status: DC | PRN
  Administered 2020-10-16: 1000 mL

## 2020-10-16 MED ORDER — AMISULPRIDE (ANTIEMETIC) 5 MG/2ML IV SOLN: 10.0000 mg | Freq: Once | INTRAVENOUS | Status: DC | PRN

## 2020-10-16 MED ORDER — LIDOCAINE 2% (20 MG/ML) 5 ML SYRINGE
INTRAMUSCULAR | Status: AC
  Filled 2020-10-16: qty 5

## 2020-10-16 MED ORDER — OXYCODONE HCL 5 MG PO TABS
2.5000 mg | ORAL_TABLET | ORAL | Status: DC | PRN
  Administered 2020-10-16 – 2020-10-22 (×4): 5 mg via ORAL
  Filled 2020-10-16 (×5): qty 1

## 2020-10-16 MED ORDER — DEXAMETHASONE SODIUM PHOSPHATE 10 MG/ML IJ SOLN
INTRAMUSCULAR | Status: DC | PRN
  Administered 2020-10-16: 5 mg via INTRAVENOUS

## 2020-10-16 MED ORDER — SCOPOLAMINE 1 MG/3DAYS TD PT72
1.0000 | MEDICATED_PATCH | TRANSDERMAL | Status: DC
  Filled 2020-10-16 (×2): qty 1

## 2020-10-16 MED ORDER — ONDANSETRON HCL 4 MG/2ML IJ SOLN
INTRAMUSCULAR | Status: AC
  Filled 2020-10-16: qty 2

## 2020-10-16 MED ORDER — GABAPENTIN 300 MG PO CAPS: 300.0000 mg | ORAL_CAPSULE | ORAL | Status: DC

## 2020-10-16 MED ORDER — SUGAMMADEX SODIUM 200 MG/2ML IV SOLN
INTRAVENOUS | Status: DC | PRN
  Administered 2020-10-16: 250 mg via INTRAVENOUS

## 2020-10-16 MED ORDER — MORPHINE SULFATE (PF) 2 MG/ML IV SOLN
1.0000 mg | INTRAVENOUS | Status: DC | PRN
Start: 2020-10-16 — End: 2020-10-17
  Administered 2020-10-16: 17:00:00 1 mg via INTRAVENOUS
  Filled 2020-10-16: qty 1

## 2020-10-16 MED ORDER — ACETAMINOPHEN 500 MG PO TABS
1000.0000 mg | ORAL_TABLET | ORAL | Status: AC
  Administered 2020-10-16: 10:00:00 1000 mg via ORAL
  Filled 2020-10-16: qty 2

## 2020-10-16 MED ORDER — LIDOCAINE HCL (PF) 2 % IJ SOLN
INTRAMUSCULAR | Status: AC
  Filled 2020-10-16: qty 10

## 2020-10-16 SURGICAL SUPPLY — 52 items
ADH SKN CLS APL DERMABOND .7 (GAUZE/BANDAGES/DRESSINGS) ×1
APL SKNCLS STERI-STRIP NONHPOA (GAUZE/BANDAGES/DRESSINGS)
APL SWBSTK 6 STRL LF DISP (MISCELLANEOUS) ×2
APPLICATOR COTTON TIP 6 STRL (MISCELLANEOUS) ×2 IMPLANT
APPLICATOR COTTON TIP 6IN STRL (MISCELLANEOUS) ×4
APPLIER CLIP 5 13 M/L LIGAMAX5 (MISCELLANEOUS)
APPLIER CLIP ROT 10 11.4 M/L (STAPLE)
APR CLP MED LRG 11.4X10 (STAPLE)
APR CLP MED LRG 5 ANG JAW (MISCELLANEOUS)
BAG SPEC RTRVL 10 TROC 200 (ENDOMECHANICALS) ×2
BENZOIN TINCTURE PRP APPL 2/3 (GAUZE/BANDAGES/DRESSINGS) IMPLANT
CABLE HIGH FREQUENCY MONO STRZ (ELECTRODE) IMPLANT
CATH REDDICK CHOLANGI 4FR 50CM (CATHETERS) ×2 IMPLANT
CLIP APPLIE 5 13 M/L LIGAMAX5 (MISCELLANEOUS) IMPLANT
CLIP APPLIE ROT 10 11.4 M/L (STAPLE) IMPLANT
COVER MAYO STAND STRL (DRAPES) ×2 IMPLANT
COVER SURGICAL LIGHT HANDLE (MISCELLANEOUS) ×2 IMPLANT
COVER WAND RF STERILE (DRAPES) IMPLANT
DECANTER SPIKE VIAL GLASS SM (MISCELLANEOUS) ×2 IMPLANT
DERMABOND ADVANCED (GAUZE/BANDAGES/DRESSINGS) ×1
DERMABOND ADVANCED .7 DNX12 (GAUZE/BANDAGES/DRESSINGS) ×1 IMPLANT
DEVICE SUTURE ENDOST 10MM (ENDOMECHANICALS) ×1 IMPLANT
DRAPE C-ARM 42X120 X-RAY (DRAPES) ×2 IMPLANT
ELECT L-HOOK LAP 45CM DISP (ELECTROSURGICAL)
ELECT PENCIL ROCKER SW 15FT (MISCELLANEOUS) IMPLANT
ELECT REM PT RETURN 15FT ADLT (MISCELLANEOUS) ×2 IMPLANT
ELECTRODE L-HOOK LAP 45CM DISP (ELECTROSURGICAL) IMPLANT
ENDOLOOP SUT PDS II  0 18 (SUTURE) ×2
ENDOLOOP SUT PDS II 0 18 (SUTURE) IMPLANT
ENDOSTITCH 0 SINGLE 48 (SUTURE) ×2 IMPLANT
EVACUATOR SILICONE 100CC (DRAIN) ×1 IMPLANT
GLOVE BIOGEL M 8.0 STRL (GLOVE) ×2 IMPLANT
GOWN STRL REUS W/TWL XL LVL3 (GOWN DISPOSABLE) ×2 IMPLANT
HEMOSTAT SURGICEL 4X8 (HEMOSTASIS) IMPLANT
IV CATH 14GX2 1/4 (CATHETERS) ×2 IMPLANT
KIT BASIN OR (CUSTOM PROCEDURE TRAY) ×2 IMPLANT
KIT TURNOVER KIT A (KITS) ×2 IMPLANT
POUCH RETRIEVAL ECOSAC 10 (ENDOMECHANICALS) IMPLANT
POUCH RETRIEVAL ECOSAC 10MM (ENDOMECHANICALS) ×4
SCISSORS LAP 5X45 EPIX DISP (ENDOMECHANICALS) ×2 IMPLANT
SET IRRIG TUBING LAPAROSCOPIC (IRRIGATION / IRRIGATOR) ×2 IMPLANT
SET TUBE SMOKE EVAC HIGH FLOW (TUBING) ×2 IMPLANT
SLEEVE XCEL OPT CAN 5 100 (ENDOMECHANICALS) ×3 IMPLANT
STRIP CLOSURE SKIN 1/2X4 (GAUZE/BANDAGES/DRESSINGS) IMPLANT
SUT ETHILON 2 0 PS N (SUTURE) ×1 IMPLANT
SUT MNCRL AB 4-0 PS2 18 (SUTURE) ×4 IMPLANT
SYR 20ML LL LF (SYRINGE) ×2 IMPLANT
TOWEL OR 17X26 10 PK STRL BLUE (TOWEL DISPOSABLE) ×2 IMPLANT
TRAY LAPAROSCOPIC (CUSTOM PROCEDURE TRAY) ×2 IMPLANT
TROCAR BLADELESS OPT 5 100 (ENDOMECHANICALS) ×2 IMPLANT
TROCAR XCEL BLUNT TIP 100MML (ENDOMECHANICALS) IMPLANT
TROCAR XCEL NON-BLD 11X100MML (ENDOMECHANICALS) IMPLANT

## 2020-10-16 NOTE — Progress Notes (Signed)
Spoke to [facility] on [place], received report for surgery.

## 2020-10-16 NOTE — Anesthesia Procedure Notes (Addendum)
Procedure Name: Intubation Date/Time: 10/16/2020 12:28 PM Performed by: West Pugh, CRNA Pre-anesthesia Checklist: Patient identified, Emergency Drugs available, Suction available, Patient being monitored and Timeout performed Patient Re-evaluated:Patient Re-evaluated prior to induction Oxygen Delivery Method: Circle system utilized Preoxygenation: Pre-oxygenation with 100% oxygen Induction Type: IV induction Ventilation: Mask ventilation without difficulty Laryngoscope Size: Mac and 3 Grade View: Grade II Tube type: Oral Tube size: 7.0 mm Number of attempts: 1 Airway Equipment and Method: Stylet Placement Confirmation: ETT inserted through vocal cords under direct vision,  positive ETCO2,  CO2 detector and breath sounds checked- equal and bilateral Secured at: 20 cm Tube secured with: Tape Dental Injury: Teeth and Oropharynx as per pre-operative assessment

## 2020-10-16 NOTE — Anesthesia Preprocedure Evaluation (Addendum)
Anesthesia Evaluation  Patient identified by MRN, date of birth, ID band Patient awake    Reviewed: Allergy & Precautions, NPO status , Patient's Chart, lab work & pertinent test results  Airway Mallampati: I  TM Distance: >3 FB Neck ROM: Full    Dental no notable dental hx.    Pulmonary neg pulmonary ROS,    Pulmonary exam normal breath sounds clear to auscultation       Cardiovascular hypertension, Pt. on medications Normal cardiovascular exam+ dysrhythmias Atrial Fibrillation + pacemaker + Valvular Problems/Murmurs MR  Rhythm:Regular Rate:Normal  ECG: SR, rate 60  ECHO: 1. Left ventricular ejection fraction, by estimation, is 65 to 70%. The left ventricle has normal function. The left ventricle has no regional wall motion abnormalities. There is severe asymmetric left ventricular  hypertrophy of the basal-septal  segment. No significant LVOT gradient at rest. Left ventricular diastolic parameters are indeterminate.  2. Right ventricular systolic function is normal. The right ventricular size is normal. There is mildly elevated pulmonary artery systolic  pressure. The estimated right ventricular systolic pressure is 86.7 mmHg.  3. Left atrial size was severely dilated.  4. The mitral valve is abnormal. Moderate mitral valve regurgitation. No evidence of mitral stenosis. Severe mitral annular calcification.  5. Tricuspid valve regurgitation is moderate.  6. The aortic valve is tricuspid. Aortic valve regurgitation is trivial.  No aortic stenosis is present.  7. Aortic dilatation noted. There is dilatation of the ascending aorta, measuring 43 mm.  8. The inferior vena cava is normal in size with greater than 50% respiratory variability, suggesting right atrial pressure of 3 mmHg.   Hypertrophic obstructive cardiomyopathy    Neuro/Psych negative neurological ROS  negative psych ROS   GI/Hepatic Neg liver ROS, GERD  ,   Endo/Other  Hypothyroidism   Renal/GU negative Renal ROS     Musculoskeletal negative musculoskeletal ROS (+)   Abdominal   Peds  Hematology  (+) anemia , HLD   Anesthesia Other Findings ACUTE CHOLCYSTITIS  Reproductive/Obstetrics                            Anesthesia Physical Anesthesia Plan  ASA: III  Anesthesia Plan: General   Post-op Pain Management:    Induction: Intravenous  PONV Risk Score and Plan: 4 or greater and Ondansetron, Dexamethasone, Treatment may vary due to age or medical condition, Amisulpride and Propofol infusion  Airway Management Planned: Oral ETT  Additional Equipment:   Intra-op Plan:   Post-operative Plan: Extubation in OR  Informed Consent: I have reviewed the patients History and Physical, chart, labs and discussed the procedure including the risks, benefits and alternatives for the proposed anesthesia with the patient or authorized representative who has indicated his/her understanding and acceptance.   Patient has DNR.  Discussed DNR with patient and Suspend DNR.   Dental advisory given  Plan Discussed with: CRNA  Anesthesia Plan Comments:        Anesthesia Quick Evaluation

## 2020-10-16 NOTE — Anesthesia Postprocedure Evaluation (Signed)
Anesthesia Post Note  Patient: Tracey Mcclure  Procedure(s) Performed: LAPAROSCOPIC CHOLECYSTECTOMY WITH INTRAOPERATIVE CHOLANGIOGRAM (N/A Abdomen)     Patient location during evaluation: PACU Anesthesia Type: General Level of consciousness: sedated and patient cooperative Pain management: pain level controlled Vital Signs Assessment: post-procedure vital signs reviewed and stable Respiratory status: spontaneous breathing Cardiovascular status: stable Anesthetic complications: no   No complications documented.  Last Vitals:  Vitals:   10/16/20 1825 10/16/20 2020  BP: (!) 109/59 (!) 149/69  Pulse: 60 60  Resp: 18 18  Temp: 36.8 C 36.7 C  SpO2: 97% 98%    Last Pain:  Vitals:   10/16/20 2020  TempSrc: Oral  PainSc:                  [name]

## 2020-10-16 NOTE — Progress Notes (Addendum)
Spoke to [place] with [employer] for patients pacemaker.  He states to provide continuous ECG monitoring when magnet is used and no other instructions are needed.  He states he does not need to interrogate it or come to hospital. Last pacemaker check for the patient was August 31, 2020 at home. Data for this check is under the Procedures Tab in Chart Review.   Dr. Roanna Banning made aware of what pacemaker rep stated.

## 2020-10-16 NOTE — Progress Notes (Signed)
Called patients daughter,Beth.  Informed her of patients case # so she could watch the patients status updates on the board in the waiting room.  Gave her an update on the time in surgery and then recovery. Told her the doctor would call her after surgery and update her.  Daughter voiced understanding.

## 2020-10-16 NOTE — Progress Notes (Signed)
(  Patient not seen; no charge)  Nondiagnostic intraoperative cholangiogram noted.  With normal caliber duct by CT, and with LFTs improving, I do not think we need an ERCP.  However, if there were lingering elevation of LFTs, and endoscopic ultrasound could be considered.  Please call us if you would like further input concerning this patient.  Cleotis Nipper, M.D. Pager [phone] If no answer or after 5 PM call [phone]

## 2020-10-16 NOTE — Progress Notes (Signed)
PROGRESS NOTE    Tracey Mcclure  EGB:[id number] DOB: 1941/06/11 DOA: 10/13/2020 PCP: Crist Infante, MD   Brief Narrative:  The patient is a a 80 year old overweight Caucasian female with a past medical history significant for but not limited to permanent atrial fibrillation and sick sinus syndrome with a pacemaker in place, hokum, hypertension, MGUS, hypothyroidism, as well as other comorbidities who had a recent cholecystitis and presented back to the hospital with abdominal pain, nausea and malaise.  She had been admitted 6 weeks ago with cholecystitis and treated with antibiotics and had a PERC drain in place.  She is doing well and was active and eating a normal diet until the day before admission.  She went to the IR clinic for drain evaluation and cholangiogram but it obtained and is determined that the existing tube was malpositioned but in trying to reposition it access was lost and attempt to replace the tube failed.  The few hours of the procedure patient was sent home and started to have epigastric pain, nausea and subjective fevers.  This persisted and was so severe that she came to the ER for further evaluation.  In the ER she was noted to be afebrile with a normal white blood cell count and had a CT of the abdomen pelvis done which showed no fluid collection and had no findings of chronic cholecystitis.  She did have abnormal LFTs so both general surgery and gastroenterology were both consulted for further evaluation recommendations.  Gastroenterology feels that her it would be difficult to get a MRCP as the patient has a pacemaker and they are recommending proceeding with a cholecystectomy with an Ladue currently her Eliquis is on hold.  She was on a clear liquid diet for now and made n.p.o. after midnight and general surgery planning on taking the patient for surgical intervention likely on Friday, 10/16/2020.  GI recommends following up with the [facility] and does not recommend up preoperative ERCP or  EUS at this time and they have signed off the case now.  She was started on IV Ceftriaxone on 10/15/20 preoperatively by general surgery.  Plan is for surgical intervention later today.  LFTs and bilirubin has continued to improve now.  GI feels that she may have passed a common duct stone or have one there is more ball valving.  Assessment & Plan:   Principal Problem:   Transaminitis Active Problems:   Hypertrophic obstructive cardiomyopathy (HCC)   HTN (hypertension)   Hypothyroidism   MGUS (monoclonal gammopathy of unknown significance)   Atrial fibrillation (HCC)   Pacemaker   Hypokalemia   Hyponatremia   Choledocholithiasis  Abnormal LFT's/Transaminitis improving Hyperbilirubinemia, improving -No CBD dilation on CT.  Other diagnostic considerations include Augmentin induced cholestasis.   -Of note, the stone was described as >3cm on imaging last hospitalization. -Continue to Hold statin -Trend LFTs and AST went from 403 -> 224 -> 118 -> 71 and ALT went from 150 -> 179 -> 124 -> 93 -T Bili worsened and went from 2.6 -> 3.7 -> 1.7 -> 1.4 -GI and General Surgery Consulted -Consulted GI re: MRCP vs ERCP; if MRCP needed, would need transfer to [facility] for MR compatible pacer monitoring and I spoke with Dr. Cristina Gong personally who feels that she does not need an MRCP given her pacer and does not feel that she needs a perioperative ERCP or EUS at this time and recommend proceeding with a surgery and intraoperative cholangiogram at that is positive then at that  time they were doing ERCP -Plan is for a laparoscopic cholecystectomy with intraoperative cholangiogram today 10/16/2020 sometime   Acute on chronic cholecystitis -PERC Chole drain removed.  No WBC or fever here, I do not think empiric antibiotics are needed yet we will continue to hold off unless general surgery feels otherwise -Monitor WBC and fever curve -Initially was going to consult IR to replace drain (last dose Eliquis 3/7 in  PM) however she did not undergo a cholecystectomy on Friday so we do not feel the need to reconsult IR -Follow blood cultures x2; they showed no growth to date less than 1 day -Further care per general surgery and she has been NPO since midnight now -General Surgery started the patient on IV antibiotics with IV ceftriaxone -Will need PT/OT to evaluate and Treat and this can be done after her surgery  Hyponatremia -Asymptomatic. -Sodium has improved and gone from 130 is now 135 today -IV fluid hydration is now stopped -Continue to monitor and trend and repeat CMP in a.m.  Hypokalemia -Improved as patient's potassium level of 3.0 is now 3.7 today -Continue to monitor and replete as necessary -IV fluid hydration is now stopped -Repeat CMP in a.m. -Mag level was 1.9 this morning  Hypomagnesemia -Patient's magnesium level was 1.5 and is improved to 1.9 -Replete with IV mag sulfate 3 g -Continue to monitor and replete as necessary -Repeat magnesium level in a.m.  Hypothyroidism -Continue Levothyroxine with 125 mcg p.o. Monday Tuesday Wednesday Thursday Friday Saturday and 62.5 mcg p.o. every Sunday -Check TSH and was 8.431 -We will check a free T4 -We will not make any adjustments given that she is hospitalized but will need to have her repeat her thyroid function tests in about 4 to 6 weeks  Permanent Atrial Fibrillation -CHA2DS2-Vasc 4.  Given that low risk profile, bridging anticoagulation will more likely raise her risk of perioperative bleeding, and is unlikely to prevent a thromboembolic event (Douketsis Charolette Child 2015, Ayoub J Stroke Cerebrovasc Dis 2016). -Her rates are controlled -Continue to Hold apixaban given that she can go for surgical procedure tomorrow  Essential Hypertension -Continue extremely low-dose hydrochlorothiazide 12.5 mg p.o. daily -IV fluid hydration is now stopped -Continue to monitor blood pressures per protocol -Last blood pressure reading  was 143/77  MGUS -Currently Inactive and Clinically stable  HOCM -Avoid fluid shifts perioperatively to the extent able -C/w HCTZ for now  DVT prophylaxis: SCDs Code Status: DO NOT RESUSCITATE  Family Communication: Discussed with Family at bedside  Disposition Plan: Pending further clinical improvement and clearance by General Surgery and Gastroenterology   Status is: Inpatient  Remains inpatient appropriate because:Unsafe d/c plan, IV treatments appropriate due to intensity of illness or inability to take PO and Inpatient level of care appropriate due to severity of illness   Dispo: The patient is from: Home              Anticipated d/c is to: TBD              Patient currently is not medically stable to d/c.   Difficult to place patient No  Consultants:   Gastroenterology  General Surgery    Procedures: None will be going for a laparoscopic cholecystectomy with intraoperative cholangiogram tomorrow  Antimicrobials:  Anti-infectives (From admission, onward)   Start     Dose/Rate Route Frequency Ordered Stop   10/15/20 1200  cefTRIAXone (ROCEPHIN) 2 g in sodium chloride 0.9 % 100 mL IVPB  2 g 200 mL/hr over 30 Minutes Intravenous Every 24 hours 10/15/20 0948          Subjective: Seen and examined at bedside and she she is feeling well and denies any abdominal pain.  She states that she is hungry.  No nausea or vomiting.  Ready for her surgical intervention.  No other concerns or complaints at this time.  Objective: Vitals:   10/15/20 0548 10/15/20 1323 10/15/20 2035 10/16/20 0540  BP: 129/60 (!) 149/65 (!) 142/68 (!) 143/77  Pulse: 61 63 (!) 58 75  Resp: 16 18 16 16   Temp:  98.4 F (36.9 C) 98.4 F (36.9 C) 98.7 F (37.1 C)  TempSrc:  Oral Oral Oral  SpO2: 97% 97% 100% 100%  Weight:      Height:        Intake/Output Summary (Last 24 hours) at 10/16/2020 1001 Last data filed at 10/16/2020 0944 Gross per 24 hour  Intake 1298.76 ml  Output 2150 ml   Net -851.24 ml   Filed Weights   10/13/20 0346  Weight: 76.2 kg   Examination: Physical Exam:  Constitutional: WN/WD overweight Caucasian female currently no acute distress appears calm and comfortable sitting in the chair Eyes: Lids and conjunctivae normal, sclerae anicteric  ENMT: External Ears, Nose appear normal. Grossly normal hearing.  Neck: Appears normal, supple, no cervical masses, normal ROM, no appreciable thyromegaly; no JVD Respiratory:, Diminished to auscultation bilaterally, no wheezing, rales, rhonchi or crackles. Normal respiratory effort and patient is not tachypenic. No accessory muscle use.  Unlabored breathing Cardiovascular: Irregularly irregular, no murmurs / rubs / gallops. S1 and S2 auscultated.  Very trace extremity edema. Abdomen: Soft, non-tender, distended secondary to body habitus. Bowel sounds positive.  GU: Deferred. Musculoskeletal: No clubbing / cyanosis of digits/nails. No joint deformity upper and lower extremities.  Skin: No rashes, lesions, ulcers on limited skin evaluation. No induration; Warm and dry.  Neurologic: CN 2-12 grossly intact with no focal deficits. Romberg sign and cerebellar reflexes not assessed.  Psychiatric: Normal judgment and insight. Alert and oriented x 3. Normal mood and appropriate affect.   Data Reviewed: I have personally reviewed following labs and imaging studies  CBC: Recent Labs  Lab 10/13/20 0345 10/14/20 0441 10/15/20 0503 10/16/20 0519  WBC 8.0 5.4 5.5 5.5  NEUTROABS 6.1  --  3.6 3.2  HGB 11.3* 10.2* 9.8* 10.4*  HCT 32.6* 30.6* 29.3* 30.6*  MCV 92.1 95.9 95.4 93.0  PLT 289 269 289 174   Basic Metabolic Panel: Recent Labs  Lab 10/13/20 0345 10/13/20 1817 10/14/20 0441 10/15/20 0503 10/16/20 0519  NA 130*  --  135 136 135  K 3.0* 4.0 4.4 3.8 3.7  CL 98  --  104 107 103  CO2 23  --  23 22 24   GLUCOSE 129*  --  102* 99 98  BUN 9  --  7* 6* <5*  CREATININE 0.65  --  0.70 0.66 0.70  CALCIUM 8.8*   --  8.7* 8.8* 8.8*  MG  --   --   --  1.5* 1.9  PHOS  --   --   --  2.9 3.6   GFR: Estimated Creatinine Clearance: 57 mL/min (by C-G formula based on SCr of 0.7 mg/dL). Liver Function Tests: Recent Labs  Lab 10/13/20 0345 10/14/20 0441 10/15/20 0503 10/16/20 0519  AST 403* 224* 118* 71*  ALT 150* 179* 124* 93*  ALKPHOS 211* 256* 221* 205*  BILITOT 2.6* 3.7* 1.7* 1.4*  PROT  6.1* 5.2* 5.1* 5.4*  ALBUMIN 3.1* 2.6* 2.6* 2.7*   Recent Labs  Lab 10/13/20 0345  LIPASE 23   No results for input(s): AMMONIA in the last 168 hours. Coagulation Profile: Recent Labs  Lab 10/13/20 0345  INR 1.6*   Cardiac Enzymes: No results for input(s): CKTOTAL, CKMB, CKMBINDEX, TROPONINI in the last 168 hours. BNP (last 3 results) No results for input(s): PROBNP in the last 8760 hours. HbA1C: No results for input(s): HGBA1C in the last 72 hours. CBG: No results for input(s): GLUCAP in the last 168 hours. Lipid Profile: No results for input(s): CHOL, HDL, LDLCALC, TRIG, CHOLHDL, LDLDIRECT in the last 72 hours. Thyroid Function Tests: Recent Labs    10/16/20 0519  TSH 8.431*   Anemia Panel: No results for input(s): VITAMINB12, FOLATE, FERRITIN, TIBC, IRON, RETICCTPCT in the last 72 hours. Sepsis Labs: Recent Labs  Lab 10/13/20 0345 10/13/20 0518  LATICACIDVEN 0.9 1.0    Recent Results (from the past 240 hour(s))  Blood Culture (routine x 2)     Status: None (Preliminary result)   Collection Time: 10/13/20  3:45 AM   Specimen: BLOOD  Result Value Ref Range Status   Specimen Description   Final    BLOOD LEFT ANTECUBITAL Performed at [facility] [address], [place], [place] [address]    Special Requests   Final    BOTTLES DRAWN AEROBIC AND ANAEROBIC Blood Culture adequate volume Performed at [facility] [address], [place], [place] [address]    Culture   Final    NO GROWTH 2 DAYS Performed at [facility]  [address], [place], [place] [address]    Report Status PENDING  Incomplete  Blood Culture (routine x 2)     Status: None (Preliminary result)   Collection Time: 10/13/20  3:45 AM   Specimen: BLOOD  Result Value Ref Range Status   Specimen Description   Final    BLOOD BLOOD LEFT FOREARM Performed at [facility] [address], [place], [place] [address]    Special Requests   Final    BOTTLES DRAWN AEROBIC AND ANAEROBIC Blood Culture adequate volume Performed at [facility] [address] [place], [place] [address]    Culture   Final    NO GROWTH 2 DAYS Performed at [facility] [address] [place], [place] [address]    Report Status PENDING  Incomplete  SARS CORONAVIRUS 2 (TAT 6-24 HRS) Nasopharyngeal Nasopharyngeal Swab     Status: None   Collection Time: 10/13/20  8:00 AM   Specimen: Nasopharyngeal Swab  Result Value Ref Range Status   SARS Coronavirus 2 NEGATIVE NEGATIVE Final    Comment: (NOTE) SARS-CoV-2 target nucleic acids are NOT DETECTED.  The SARS-CoV-2 RNA is generally detectable in upper and lower respiratory specimens during the acute phase of infection. Negative results do not preclude SARS-CoV-2 infection, do not rule out co-infections with other pathogens, and should not be used as the sole basis for treatment or other patient management decisions. Negative results must be combined with clinical observations, patient history, and epidemiological information. The expected result is Negative.  Fact Sheet for Patients: SugarRoll.be  Fact Sheet for Healthcare Providers: https://www.woods-mathews.com/  This test is not yet approved or cleared by the [place] FDA and  has been authorized for detection and/or diagnosis of SARS-CoV-2 by FDA under an Emergency Use Authorization (EUA). This EUA will remain  in effect (  meaning this test can be used) for the duration of  the COVID-19 declaration under Se ction 564(b)(1) of the Act, 21 U.S.C. section 360bbb-3(b)(1), unless the authorization is terminated or revoked sooner.  Performed at [facility], [place] [address], [place], [place] [address]   MRSA PCR Screening     Status: None   Collection Time: 10/15/20  6:01 AM   Specimen: Nasal Mucosa; Nasopharyngeal  Result Value Ref Range Status   MRSA by PCR NEGATIVE NEGATIVE Final    Comment:        The GeneXpert MRSA Assay (FDA approved for NASAL specimens only), is one component of a comprehensive MRSA colonization surveillance program. It is not intended to diagnose MRSA infection nor to guide or monitor treatment for MRSA infections. Performed at [facility], [place] [address] [place], [place] [address]     RN Pressure Injury Documentation:     Estimated body mass index is 28.84 kg/m as calculated from the following:   Height as of this encounter: 5\' 4"  (1.626 m).   Weight as of this encounter: 76.2 kg.  Malnutrition Type:   Malnutrition Characteristics:   Nutrition Interventions:     Radiology Studies: No results found. Scheduled Meds: . acetaminophen  1,000 mg Oral On Call to OR  . bupivacaine liposome  20 mL Infiltration Once  . gabapentin  300 mg Oral On Call to OR  . hydrochlorothiazide  12.5 mg Oral Daily  . levothyroxine  125 mcg Oral Once per day on Mon Tue Wed Thu Fri Sat  . [START ON 10/18/2020] levothyroxine  62.5 mcg Oral Q Sun  . scopolamine  1 patch Transdermal On Call to OR   Continuous Infusions: . cefTRIAXone (ROCEPHIN)  IV 2 g (10/15/20 1239)    LOS: 2 days   Kerney Elbe, DO [facility] PAGER is on [place]  If 7PM-7AM, please contact night-coverage [email or web address]

## 2020-10-16 NOTE — Transfer of Care (Signed)
Immediate Anesthesia Transfer of Care Note  Patient: Tracey Mcclure  Procedure(s) Performed: LAPAROSCOPIC CHOLECYSTECTOMY WITH INTRAOPERATIVE CHOLANGIOGRAM (N/A Abdomen)  Patient Location: PACU  Anesthesia Type:General  Level of Consciousness: drowsy and patient cooperative  Airway & Oxygen Therapy: Patient Spontanous Breathing and Patient connected to face mask oxygen  Post-op Assessment: Report given to RN and Post -op Vital signs reviewed and stable  Post vital signs: Reviewed and stable  Last Vitals:  Vitals Value Taken Time  BP 151/64 10/16/20 1434  Temp    Pulse 60 10/16/20 1440  Resp 10 10/16/20 1440  SpO2 99 % 10/16/20 1440  Vitals shown include unvalidated device data.  Last Pain:  Vitals:   10/16/20 1042  TempSrc:   PainSc: 0-No pain      Patients Stated Pain Goal: 0 (90/90/30 1499)  Complications: No complications documented.

## 2020-10-16 NOTE — Op Note (Signed)
[name]  Primary Care Physician:  Crist Infante, MD    10/16/2020  2:27 PM  Procedure: Laparoscopic Cholecystectomy-(anterior then posterior wall and separate extraction of 3.6 cm stone) with attempted intraoperative cholangiogram-extravasation.    Surgeon: Althea Grimmer. Hassell Done, MD, FACS Asst:  none  Anes:  General  Drains:  57 Blake  Findings: Severe subacute cholecystitis with large stone     Description of Procedure: The patient was taken to OR 4 and given general anesthesia.  The patient was prepped with chlorohexidine prep and draped sterilely. A time out was performed including identifying the patient and discussing their procedure.  Access to the abdomen was achieved with a 5 mm Optiview through the left upper quadrant.  Port placement included a total of 5 trochars with one 11 mm in the midline.  Patient had a previous cholecystostomy and there were adhesions of the omentum to the fundus which required vascular.  These were taken down to free the gallbladder and to enable me to elevate it.  It was extremely thick-walled and difficult to grasp.  We were however able to elevate it.  The infundibulum and gallbladder final were very thickened but I was down try to dissected out the cystic duct to get a cholangiogram and had an opening in the infundibulum.  I attempted to pass a Reddick catheter into the cystic duct through this opening and that was unsuccessful and I had some extravasation so there was no successful cholangiogram.  I then went ahead and used the opening in the gallbladder to complete the removal of the anterior wall the gallbladder and the black anthracotic pigment 3.6 cm gallstone.  Once the anterior wall the gallbladder had been removed it was brought out in a bag and the stone was brought in a separate bag and then I took the posterior wall off as of our doing a standard top-down cholecystectomy removing it down to the level of the opening into the final of the  infundibulum which point I transected it and removed it.  There was a little stone that I milked out of the cystic duct and following that I closed this final with a horizontal mattress pursestring type closure as seen above of 0 Vicryl.  A 19 Blake drain was left in and brought out through the right side and secured with 4-0 nylon.  The 11 mm port as I had to dilated for all the extractions was closed with a figure-of-eight suture of 0 Vicryl.  I injected all the port sites with Exparel.    Incisions were injected with Exparel and closed with 4-0 Monocryl and Dermabond on the skin.  Sponge and needle count were correct.    The patient was taken to the recovery room in satisfactory condition.

## 2020-10-16 NOTE — Interval H&P Note (Signed)
History and Physical Interval Note:  10/16/2020 12:22 PM  Tracey Mcclure  has presented today for surgery, with the diagnosis of ACUTE CHOLCYSTITIS.  The various methods of treatment have been discussed with the patient and family. After consideration of risks, benefits and other options for treatment, the patient has consented to  Procedure(s): LAPAROSCOPIC CHOLECYSTECTOMY WITH INTRAOPERATIVE CHOLANGIOGRAM (N/A) as a surgical intervention.  The patient's history has been reviewed, patient examined, no change in status, stable for surgery.  I have reviewed the patient's chart and labs.  Questions were answered to the patient's satisfaction.     Pedro Earls

## 2020-10-16 NOTE — Care Management Important Message (Signed)
Important Message  Patient Details IM Letter placed in Patients room Name: Tracey Mcclure MRN: [id number] Date of Birth: 31-Dec-1940   Medicare Important Message Given:  Yes     Kerin Salen 10/16/2020, 11:04 AM

## 2020-10-17 ENCOUNTER — Encounter (HOSPITAL_COMMUNITY): Payer: Self-pay | Admitting: Surgery

## 2020-10-17 DIAGNOSIS — I10 Essential (primary) hypertension: Secondary | ICD-10-CM | POA: Diagnosis not present

## 2020-10-17 DIAGNOSIS — R413 Other amnesia: Secondary | ICD-10-CM

## 2020-10-17 DIAGNOSIS — R7401 Elevation of levels of liver transaminase levels: Secondary | ICD-10-CM | POA: Diagnosis not present

## 2020-10-17 DIAGNOSIS — I4819 Other persistent atrial fibrillation: Secondary | ICD-10-CM | POA: Diagnosis not present

## 2020-10-17 DIAGNOSIS — I421 Obstructive hypertrophic cardiomyopathy: Secondary | ICD-10-CM | POA: Diagnosis not present

## 2020-10-17 DIAGNOSIS — Z7901 Long term (current) use of anticoagulants: Secondary | ICD-10-CM

## 2020-10-17 LAB — COMPREHENSIVE METABOLIC PANEL
ALT: 95 U/L — ABNORMAL HIGH (ref 0–44)
AST: 72 U/L — ABNORMAL HIGH (ref 15–41)
Albumin: 3.2 g/dL — ABNORMAL LOW (ref 3.5–5.0)
Alkaline Phosphatase: 228 U/L — ABNORMAL HIGH (ref 38–126)
Anion gap: 11 (ref 5–15)
BUN: 8 mg/dL (ref 8–23)
CO2: 23 mmol/L (ref 22–32)
Calcium: 9.1 mg/dL (ref 8.9–10.3)
Chloride: 98 mmol/L (ref 98–111)
Creatinine, Ser: 0.77 mg/dL (ref 0.44–1.00)
GFR, Estimated: >60 mL/min (ref >60)
Glucose, Bld: 127 mg/dL — ABNORMAL HIGH (ref 70–99)
Potassium: 4.2 mmol/L (ref 3.5–5.1)
Sodium: 132 mmol/L — ABNORMAL LOW (ref 135–145)
Total Bilirubin: 1 mg/dL (ref 0.3–1.2)
Total Protein: 6.6 g/dL (ref 6.5–8.1)

## 2020-10-17 LAB — CBC WITH DIFFERENTIAL/PLATELET
Abs Immature Granulocytes: 0.03 10*3/uL (ref 0.00–0.07)
Basophils Absolute: 0 10*3/uL (ref 0.0–0.1)
Basophils Relative: 0 %
Eosinophils Absolute: 0 10*3/uL (ref 0.0–0.5)
Eosinophils Relative: 0 %
HCT: 37.3 % (ref 36.0–46.0)
Hemoglobin: 12.6 g/dL (ref 12.0–15.0)
Immature Granulocytes: 0 %
Lymphocytes Relative: 11 %
Lymphs Abs: 1 10*3/uL (ref 0.7–4.0)
MCH: 32 pg (ref 26.0–34.0)
MCHC: 33.8 g/dL (ref 30.0–36.0)
MCV: 94.7 fL (ref 80.0–100.0)
Monocytes Absolute: 0.4 10*3/uL (ref 0.1–1.0)
Monocytes Relative: 4 %
Neutro Abs: 8 10*3/uL — ABNORMAL HIGH (ref 1.7–7.7)
Neutrophils Relative %: 85 %
Platelets: 422 10*3/uL — ABNORMAL HIGH (ref 150–400)
RBC: 3.94 MIL/uL (ref 3.87–5.11)
RDW: 13.2 % (ref 11.5–15.5)
WBC: 9.5 10*3/uL (ref 4.0–10.5)
nRBC: 0 % (ref 0.0–0.2)

## 2020-10-17 LAB — T4, FREE: Free T4: 0.92 ng/dL (ref 0.61–1.12)

## 2020-10-17 LAB — PHOSPHORUS: Phosphorus: 5 mg/dL — ABNORMAL HIGH (ref 2.5–4.6)

## 2020-10-17 LAB — MAGNESIUM: Magnesium: 1.7 mg/dL (ref 1.7–2.4)

## 2020-10-17 MED ORDER — SODIUM CHLORIDE 0.9 % IV SOLN: 250.0000 mL | INTRAVENOUS | Status: DC | PRN

## 2020-10-17 MED ORDER — ENSURE SURGERY PO LIQD
237.0000 mL | Freq: Two times a day (BID) | ORAL | Status: DC
  Administered 2020-10-17 – 2020-10-19 (×2): 237 mL via ORAL

## 2020-10-17 MED ORDER — SODIUM CHLORIDE 0.9 % IV SOLN: INTRAVENOUS | Status: DC

## 2020-10-17 MED ORDER — PIPERACILLIN-TAZOBACTAM 3.375 G IVPB
3.3750 g | Freq: Three times a day (TID) | INTRAVENOUS | Status: AC
  Administered 2020-10-17 – 2020-10-22 (×14): 3.375 g via INTRAVENOUS
  Filled 2020-10-17 (×14): qty 50

## 2020-10-17 MED ORDER — ACETAMINOPHEN 500 MG PO TABS
500.0000 mg | ORAL_TABLET | Freq: Three times a day (TID) | ORAL | Status: DC
  Administered 2020-10-17 (×3): 500 mg via ORAL
  Filled 2020-10-17 (×3): qty 1

## 2020-10-17 MED ORDER — METHOCARBAMOL 500 MG PO TABS
1000.0000 mg | ORAL_TABLET | Freq: Four times a day (QID) | ORAL | Status: DC | PRN
  Administered 2020-10-22 (×2): 1000 mg via ORAL
  Filled 2020-10-17 (×4): qty 2

## 2020-10-17 MED ORDER — HYDROCORTISONE (PERIANAL) 2.5 % EX CREA
1.0000 "application " | TOPICAL_CREAM | Freq: Four times a day (QID) | CUTANEOUS | Status: DC | PRN
  Filled 2020-10-17: qty 28.35

## 2020-10-17 MED ORDER — SODIUM CHLORIDE 0.9% FLUSH
3.0000 mL | Freq: Two times a day (BID) | INTRAVENOUS | Status: DC
  Administered 2020-10-18 – 2020-10-24 (×5): 3 mL via INTRAVENOUS

## 2020-10-17 MED ORDER — MENTHOL 3 MG MT LOZG: 1.0000 | LOZENGE | OROMUCOSAL | Status: DC | PRN

## 2020-10-17 MED ORDER — MAGNESIUM SULFATE 2 GM/50ML IV SOLN
2.0000 g | Freq: Once | INTRAVENOUS | Status: AC
  Administered 2020-10-17: 13:00:00 2 g via INTRAVENOUS
  Filled 2020-10-17: qty 50

## 2020-10-17 MED ORDER — CALCIUM POLYCARBOPHIL 625 MG PO TABS
625.0000 mg | ORAL_TABLET | Freq: Two times a day (BID) | ORAL | Status: DC
  Administered 2020-10-17 – 2020-10-24 (×15): 625 mg via ORAL
  Filled 2020-10-17 (×14): qty 1

## 2020-10-17 MED ORDER — FENTANYL CITRATE (PF) 100 MCG/2ML IJ SOLN: 25.0000 ug | INTRAMUSCULAR | Status: DC | PRN

## 2020-10-17 MED ORDER — HYDROCORTISONE 1 % EX CREA
1.0000 "application " | TOPICAL_CREAM | Freq: Three times a day (TID) | CUTANEOUS | Status: DC | PRN
  Filled 2020-10-17: qty 28

## 2020-10-17 MED ORDER — SODIUM CHLORIDE 0.9% FLUSH: 3.0000 mL | INTRAVENOUS | Status: DC | PRN

## 2020-10-17 MED ORDER — LIP MEDEX EX OINT
1.0000 "application " | TOPICAL_OINTMENT | Freq: Two times a day (BID) | CUTANEOUS | Status: DC
  Administered 2020-10-17 – 2020-10-24 (×12): 1 via TOPICAL
  Filled 2020-10-17 (×2): qty 7

## 2020-10-17 MED ORDER — SODIUM CHLORIDE 0.9% FLUSH: 3.0000 mL | Freq: Two times a day (BID) | INTRAVENOUS | Status: DC

## 2020-10-17 MED ORDER — GUAIFENESIN-DM 100-10 MG/5ML PO SYRP: 10.0000 mL | ORAL_SOLUTION | ORAL | Status: DC | PRN

## 2020-10-17 MED ORDER — ALUM & MAG HYDROXIDE-SIMETH 200-200-20 MG/5ML PO SUSP
30.0000 mL | Freq: Four times a day (QID) | ORAL | Status: DC | PRN
  Administered 2020-10-22 – 2020-10-24 (×3): 30 mL via ORAL
  Filled 2020-10-17 (×3): qty 30

## 2020-10-17 MED ORDER — PHENOL 1.4 % MT LIQD
1.0000 | OROMUCOSAL | Status: DC | PRN
  Filled 2020-10-17: qty 177

## 2020-10-17 MED ORDER — ALBUMIN HUMAN 5 % IV SOLN
12.5000 g | Freq: Four times a day (QID) | INTRAVENOUS | Status: AC | PRN
  Filled 2020-10-17: qty 250

## 2020-10-17 MED ORDER — METHOCARBAMOL 1000 MG/10ML IJ SOLN
1000.0000 mg | Freq: Four times a day (QID) | INTRAVENOUS | Status: DC | PRN
  Filled 2020-10-17: qty 10

## 2020-10-17 MED ORDER — MAGIC MOUTHWASH
15.0000 mL | Freq: Four times a day (QID) | ORAL | Status: DC | PRN
  Filled 2020-10-17: qty 15

## 2020-10-17 MED ORDER — LACTATED RINGERS IV BOLUS: 1000.0000 mL | Freq: Three times a day (TID) | INTRAVENOUS | Status: DC | PRN

## 2020-10-17 NOTE — Progress Notes (Signed)
Tracey Mcclure [id number] [birth year]  CARE TEAM:  PCP: Crist Infante, MD  Outpatient Care Team: Patient Care Team: Crist Infante, MD as PCP - General (Internal Medicine) [place]ique, Peter M, MD as PCP - Cardiology (Cardiology) Heath Lark, MD as Consulting Physician (Hematology and Oncology)  Inpatient Treatment Team: Treatment Team: Attending Provider: Kerney Elbe, DO; Rounding Team: Edison Pace, Md, MD; Consulting Physician: Ronald Lobo, MD; Rounding Team: Fatima Blank, MD; Technician: Ernest Mallick, NT; Utilization Review: Orlean Bradford, RN; Registered Nurse: Celedonio Savage, RN; Utilization Review: Conception Oms, RN   Problem List:   Principal Problem:   Transaminitis Active Problems:   Hypertrophic obstructive cardiomyopathy (HCC)   HTN (hypertension)   Hypothyroidism   Obesity   MGUS (monoclonal gammopathy of unknown significance)   Atrial fibrillation ([place])   Pacemaker   Hypokalemia   Hyponatremia   Choledocholithiasis   Memory difficulty   1 Day Post-Op  10/16/2020  Procedure:      Laparoscopic Cholecystectomy-(anterior then posterior wall and separate extraction of 3.6 cm stone) with attempted intraoperative cholangiogram-extravasation.    Surgeon:         Althea Grimmer. Hassell Done, MD, FACS Asst:                none  Anes:               General  Drains:             55 Blake  Findings:         Severe subacute cholecystitis with large stone   Assessment  Recovering relatively well but high risk for delayed postop bile leak  [facility] Stay = 3 days)  Assessment/Plan Atrial fibrillation on chronic anticoagulation last dose Eliquis p.m. 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  Horrible tissues requiring oversew of cystic duct by Dr. Hassell Done 10/16/2020.  Follow drain output since very high risk for cystic duct stump leak.   Follow liver function test If drainage is bilious or concerning, may get HIDA scan.  Evaluate leak may need ERCP with stenting.  IV antibiotics.  Zosyn x5 more days.  FEN: clear liquids/IV fluids ID: None DVT: Eliquis last dose p.m. 10/12/2020/SCD.  Hold anticoagulation for now.  Disposition:  Per TR[facility]  25 minutes spent in review, evaluation, examination, counseling, and coordination of care.   I have reviewed this patient's available data, including medical history, events of note, physical examination and test results as part of my evaluation.  A significant portion of that time was spent in counseling.  Care during the described time interval was provided by me.  10/17/2020    Subjective: (Chief complaint)  Daughter at bedside  Patient tolerating clears and enjoying popsicle.  Denies much abdominal pain.  No nausea or vomiting.  Objective:  Vital signs:  Vitals:   10/16/20 1825 10/16/20 2020 10/17/20 0056 10/17/20 0532  BP: (!) 109/59 (!) 149/69 (!) 146/76 (!) 157/70  Pulse: 60 60 60 60  Resp: 18 18 14 18   Temp: 98.2 F (36.8 C) 98.1 F (36.7 C)  (!) 97.4 F (36.3 C)  TempSrc: Oral Oral  Oral  SpO2: 97% 98% 92% 91%  Weight:  Height:        Last BM Date: 10/13/20  Intake/Output   Yesterday:  03/11 0701 - 03/12 0700 In: 1250 [P.O.:450; I.V.:700; IV Piggyback:100] Out: 690 [Urine:400; Drains:290] This shift:  Total I/O In: 480 [P.O.:480] Out: 260 [Urine:200; Drains:60]  Bowel function:  Flatus: No  BM:  No  Drain: Thinly red and possibly bilious.   Physical  Exam:  General: Pt awake/alert in no acute distress Eyes: PERRL, normal EOM.  Sclera clear.  No icterus Neuro: CN II-XII intact w/o focal sensory/motor deficits. Lymph: No head/neck/groin lymphadenopathy Psych:  No delerium/psychosis/paranoia.  Oriented x 3 HENT: Normocephalic, Mucus membranes moist.  No thrush Neck: Supple, No tracheal deviation.  No obvious thyromegaly Chest: No pain to chest wall compression.  Good respiratory excursion.  No audible wheezing CV:  Pulses intact.  Regular rhythm.  No major extremity edema MS: Normal AROM mjr joints.  No obvious deformity  Abdomen: Soft.  Nondistended.  Mildly tender at incisions only.  No evidence of peritonitis.  No incarcerated hernias.  Ext:  No deformity.  No mjr edema.  No cyanosis Skin: No petechiae / purpurea.  No major sores.  Warm and dry    Results:   Cultures: Recent Results (from the past 720 hour(s))  Blood Culture (routine x 2)     Status: None (Preliminary result)   Collection Time: 10/13/20  3:45 AM   Specimen: BLOOD  Result Value Ref Range Status   Specimen Description   Final    BLOOD LEFT ANTECUBITAL Performed at [facility] [address], [place], [place] [address]    Special Requests   Final    BOTTLES DRAWN AEROBIC AND ANAEROBIC Blood Culture adequate volume Performed at [facility] [address] [place], [place] [address]    Culture   Final    NO GROWTH 3 DAYS Performed at [facility], [place] [address], [place], [place] [address]    Report Status PENDING  Incomplete  Blood Culture (routine x 2)     Status: None (Preliminary result)   Collection Time: 10/13/20  3:45 AM   Specimen: BLOOD  Result Value Ref Range Status   Specimen Description   Final    BLOOD BLOOD LEFT FOREARM Performed at [facility] [address] [place], [place] [address]    Special Requests   Final    BOTTLES DRAWN AEROBIC AND ANAEROBIC Blood Culture  adequate volume Performed at [facility] [address], [place], [place] [address]    Culture   Final    NO GROWTH 3 DAYS Performed at [facility], [place] [address], [place],  [address]    Report Status PENDING  Incomplete  SARS CORONAVIRUS 2 (TAT 6-24 HRS) Nasopharyngeal Nasopharyngeal Swab     Status: None   Collection Time: 10/13/20  8:00 AM   Specimen: Nasopharyngeal Swab  Result Value Ref Range Status   SARS Coronavirus 2 NEGATIVE NEGATIVE Final    Comment: (NOTE) SARS-CoV-2 target nucleic acids are NOT DETECTED.  The SARS-CoV-2 RNA is generally detectable in upper and lower respiratory specimens during the acute phase of infection. Negative results do not preclude SARS-CoV-2 infection, do not rule out co-infections with other pathogens, and should not be used as the sole basis for treatment or other patient management decisions. Negative results must be combined with clinical observations, patient history, and epidemiological information. The expected result is Negative.  Fact Sheet for Patients: SugarRoll.be  Fact Sheet for Healthcare Providers: https://www.woods-mathews.com/  This test is not yet approved or cleared by the [place] FDA and  has been authorized for detection and/or diagnosis of SARS-CoV-2 by FDA under an Emergency Use Authorization (EUA). This EUA will remain  in effect (meaning this test can be used) for the duration of the COVID-19 declaration under Se ction 564(b)(1) of the Act, 21 U.S.C. section 360bbb-3(b)(1), unless the authorization is terminated or revoked sooner.  Performed at [facility], [place] [address] [place], [place] [address]   MRSA PCR Screening     Status: None   Collection Time: 10/15/20  6:01 AM   Specimen: Nasal Mucosa; Nasopharyngeal  Result Value Ref Range Status   MRSA by PCR NEGATIVE NEGATIVE Final    Comment:        The GeneXpert MRSA  Assay (FDA approved for NASAL specimens only), is one component of a comprehensive MRSA colonization surveillance program. It is not intended to diagnose MRSA infection nor to guide or monitor treatment for MRSA infections. Performed at [facility], [place] [address] [place], [place] [address]     Labs: Results for orders placed or performed during the hospital encounter of 10/13/20 (from the past 48 hour(s))  TSH     Status: Abnormal   Collection Time: 10/16/20  5:19 AM  Result Value Ref Range   TSH 8.431 (H) 0.350 - 4.500 uIU/mL    Comment: Performed by a 3rd Generation assay with a functional sensitivity of <=0.01 uIU/mL. Performed at [facility], [place] [address], [place], [place] [address]   CBC with Differential/Platelet     Status: Abnormal   Collection Time: 10/16/20  5:19 AM  Result Value Ref Range   WBC 5.5 4.0 - 10.5 K/uL   RBC 3.29 (L) 3.87 - 5.11 MIL/uL   Hemoglobin 10.4 (L) 12.0 - 15.0 g/dL   HCT 30.6 (L) 36.0 - 46.0 %   MCV 93.0 80.0 - 100.0 fL   MCH 31.6 26.0 - 34.0 pg   MCHC 34.0 30.0 - 36.0 g/dL   RDW 13.2 11.5 - 15.5 %   Platelets 321 150 - 400 K/uL   nRBC 0.0 0.0 - 0.2 %   Neutrophils Relative % 58 %   Neutro Abs 3.2 1.7 - 7.7 K/uL   Lymphocytes Relative 28 %   Lymphs Abs 1.5 0.7 - 4.0 K/uL   Monocytes Relative 8 %   Monocytes Absolute 0.4 0.1 - 1.0 K/uL   Eosinophils Relative 5 %   Eosinophils Absolute 0.3 0.0 - 0.5 K/uL   Basophils Relative 1 %   Basophils Absolute 0.1 0.0 - 0.1 K/uL   Immature Granulocytes 0 %   Abs Immature Granulocytes 0.02 0.00 - 0.07 K/uL    Comment: Performed at [facility], [place] [address], [place], [place] [address]  Comprehensive metabolic panel     Status: Abnormal   Collection Time: 10/16/20  5:19 AM  Result Value Ref Range   Sodium 135 135 - 145 mmol/L   Potassium 3.7 3.5 - 5.1 mmol/L   Chloride 103 98 - 111 mmol/L   CO2 24 22 - 32 mmol/L   Glucose, Bld 98  70 - 99 mg/dL    Comment: Glucose reference range applies only to samples taken after fasting for at least 8 hours.   BUN <5 (L) 8 - 23 mg/dL   Creatinine, Ser 0.70 0.44 - 1.00 mg/dL   Calcium 8.8 (L) 8.9 -  10.3 mg/dL   Total Protein 5.4 (L) 6.5 - 8.1 g/dL   Albumin 2.7 (L) 3.5 - 5.0 g/dL   AST 71 (H) 15 - 41 U/L   ALT 93 (H) 0 - 44 U/L   Alkaline Phosphatase 205 (H) 38 - 126 U/L   Total Bilirubin 1.4 (H) 0.3 - 1.2 mg/dL   GFR, Estimated >60 >60 mL/min    Comment: (NOTE) Calculated using the CKD-EPI Creatinine Equation (2021)    Anion gap 8 5 - 15    Comment: Performed at [facility], [place] [address], [place], [place] [address]  Magnesium     Status: None   Collection Time: 10/16/20  5:19 AM  Result Value Ref Range   Magnesium 1.9 1.7 - 2.4 mg/dL    Comment: Performed at [facility], [place] [address] [place], [place] [address]  Phosphorus     Status: None   Collection Time: 10/16/20  5:19 AM  Result Value Ref Range   Phosphorus 3.6 2.5 - 4.6 mg/dL    Comment: Performed at [facility], [place] [address], [place], [place] [address]  CBC with Differential/Platelet     Status: Abnormal   Collection Time: 10/17/20  5:31 AM  Result Value Ref Range   WBC 9.5 4.0 - 10.5 K/uL   RBC 3.94 3.87 - 5.11 MIL/uL   Hemoglobin 12.6 12.0 - 15.0 g/dL   HCT 37.3 36.0 - 46.0 %   MCV 94.7 80.0 - 100.0 fL   MCH 32.0 26.0 - 34.0 pg   MCHC 33.8 30.0 - 36.0 g/dL   RDW 13.2 11.5 - 15.5 %   Platelets 422 (H) 150 - 400 K/uL   nRBC 0.0 0.0 - 0.2 %   Neutrophils Relative % 85 %   Neutro Abs 8.0 (H) 1.7 - 7.7 K/uL   Lymphocytes Relative 11 %   Lymphs Abs 1.0 0.7 - 4.0 K/uL   Monocytes Relative 4 %   Monocytes Absolute 0.4 0.1 - 1.0 K/uL   Eosinophils Relative 0 %   Eosinophils Absolute 0.0 0.0 - 0.5 K/uL   Basophils Relative 0 %   Basophils Absolute 0.0 0.0 - 0.1 K/uL   Immature Granulocytes 0 %   Abs Immature Granulocytes 0.03 0.00 - 0.07 K/uL     Comment: Performed at [facility], [place] [address] [place], [place] [address]  Comprehensive metabolic panel     Status: Abnormal   Collection Time: 10/17/20  5:31 AM  Result Value Ref Range   Sodium 132 (L) 135 - 145 mmol/L   Potassium 4.2 3.5 - 5.1 mmol/L   Chloride 98 98 - 111 mmol/L   CO2 23 22 - 32 mmol/L   Glucose, Bld 127 (H) 70 - 99 mg/dL    Comment: Glucose reference range applies only to samples taken after fasting for at least 8 hours.   BUN 8 8 - 23 mg/dL   Creatinine, Ser 0.77 0.44 - 1.00 mg/dL   Calcium 9.1 8.9 - 10.3 mg/dL   Total Protein 6.6 6.5 - 8.1 g/dL   Albumin 3.2 (L) 3.5 - 5.0 g/dL   AST 72 (H) 15 - 41 U/L   ALT 95 (H) 0 - 44 U/L   Alkaline Phosphatase 228 (H) 38 - 126 U/L   Total Bilirubin 1.0 0.3 - 1.2 mg/dL   GFR, Estimated >60 >60 mL/min    Comment: (NOTE) Calculated using the CKD-EPI Creatinine Equation (2021)    Anion gap  11 5 - 15    Comment: Performed at [facility], [place] [address] [place], [place] [address]  Phosphorus     Status: Abnormal   Collection Time: 10/17/20  5:31 AM  Result Value Ref Range   Phosphorus 5.0 (H) 2.5 - 4.6 mg/dL    Comment: Performed at [facility], [place] [address], [place], [place] [address]  Magnesium     Status: None   Collection Time: 10/17/20  5:31 AM  Result Value Ref Range   Magnesium 1.7 1.7 - 2.4 mg/dL    Comment: Performed at [facility], [place] [address] [place], [place] [address]  T4, free     Status: None   Collection Time: 10/17/20  5:31 AM  Result Value Ref Range   Free T4 0.92 0.61 - 1.12 ng/dL    Comment: (NOTE) Biotin ingestion may interfere with free T4 tests. If the results are inconsistent with the TSH level, previous test results, or the clinical presentation, then consider biotin interference. If needed, order repeat testing after stopping biotin. Performed at [facility], [place] [address] [place],  [place] [address]     Imaging / Studies: DG Cholangiogram Operative  Result Date: 10/16/2020 CLINICAL DATA:  Attempted cholangiogram EXAM: INTRAOPERATIVE CHOLANGIOGRAM TECHNIQUE: Cholangiographic images from the C-arm fluoroscopic device were submitted for interpretation post-operatively. Please see the procedural report for the amount of contrast and the fluoroscopy time utilized. COMPARISON:  October 13, 2020 FINDINGS: Injection of contrast did not opacify the biliary tree. Only extraluminal contrast was visualized. The biliary tree cannot be adequately assessed on this exam. One cine clip was submitted for evaluation. The total fluoroscopy time was 18 seconds. The total dose was 5.04 mGy. IMPRESSION: Nondiagnostic cholangiogram. Electronically Signed   By: Constance Holster M.D.   On: 10/16/2020 14:26    Medications / Allergies: per chart  Antibiotics: Anti-infectives (From admission, onward)   Start     Dose/Rate Route Frequency Ordered Stop   10/17/20 1000  cefTRIAXone (ROCEPHIN) 2 g in sodium chloride 0.9 % 100 mL IVPB        2 g 200 mL/hr over 30 Minutes Intravenous Every 24 hours 10/16/20 1639 10/18/20 0959   10/15/20 1200  cefTRIAXone (ROCEPHIN) 2 g in sodium chloride 0.9 % 100 mL IVPB  Status:  Discontinued        2 g 200 mL/hr over 30 Minutes Intravenous Every 24 hours 10/15/20 0948 10/16/20 1639        Note: Portions of this report may have been transcribed using voice recognition software. Every effort was made to ensure accuracy; however, inadvertent computerized transcription errors may be present.   Any transcriptional errors that result from this process are unintentional.    Adin Hector, MD, FACS, MASCRS  Gastrointestinal and Minimally Invasive Surgery  [facility] [address] [place], [place] [address] [phone] Fax [phone] Main/Paging  CONTACT INFORMATION: Weekday (9AM-5PM) concerns: Call [facility] main office at  [phone] Weeknight (5PM-9AM) or Weekend/Holiday concerns: Check [email or web address] for General Surgery [facility] coverage (Please, do not use SecureChat as it is not reliable communication to operating surgeons for immediate patient care)      10/17/2020  10:38 AM

## 2020-10-17 NOTE — Progress Notes (Signed)
PROGRESS NOTE    Tracey Mcclure  IOX:[id number] DOB: 28-May-1941 DOA: 10/13/2020 PCP: Crist Infante, MD   Brief Narrative:  The patient is a a 80 year old overweight Caucasian female with a past medical history significant for but not limited to permanent atrial fibrillation and sick sinus syndrome with a pacemaker in place, hokum, hypertension, MGUS, hypothyroidism, as well as other comorbidities who had a recent cholecystitis and presented back to the hospital with abdominal pain, nausea and malaise.  She had been admitted 6 weeks ago with cholecystitis and treated with antibiotics and had a PERC drain in place.  She is doing well and was active and eating a normal diet until the day before admission.  She went to the IR clinic for drain evaluation and cholangiogram but it obtained and is determined that the existing tube was malpositioned but in trying to reposition it access was lost and attempt to replace the tube failed.  The few hours of the procedure patient was sent home and started to have epigastric pain, nausea and subjective fevers.  This persisted and was so severe that she came to the ER for further evaluation.  In the ER she was noted to be afebrile with a normal white blood cell count and had a CT of the abdomen pelvis done which showed no fluid collection and had no findings of chronic cholecystitis.  She did have abnormal LFTs so both general surgery and gastroenterology were both consulted for further evaluation recommendations.  Gastroenterology feels that her it would be difficult to get a MRCP as the patient has a pacemaker and they are recommending proceeding with a cholecystectomy with an [place] currently her Eliquis is on hold.  She was on a clear liquid diet for now and made n.p.o. after midnight and general surgery planning on taking the patient for surgical intervention likely on Friday, 10/16/2020.  GI recommends following up with the [facility] and does not recommend up preoperative ERCP or  EUS at this time and they have signed off the case now.  She was started on IV Ceftriaxone on 10/15/20 preoperatively by general surgery and underwent surgical intervention on 10/16/20.  LFTs and bilirubin has continued to improve now and LFTs have stablized.  GI feels that she may have passed a common duct stone or have one there is more ball valving.   She is status post laparoscopic cholecystectomy with attempted intraoperative cholangiogram and now has a Blake drain in place he also had laparoscopic lysis of adhesions and apparently had horrible tissues requiring to oversew the cystic duct.  General surgery is concerned that she is at high risk for cystic duct stump bile leak and recommending following drain output and following liver functions.  Per their recommendation if the drainage is bilious or concerning he may get a HIDA scan to evaluate for leak and she may need an ERCP with stenting still.  They are recommending clear liquid diet for now and IV Zosyn for 5 days and holding off on Anticoagulation currently.   Assessment & Plan:   Principal Problem:   Acute calculous cholecystitis s/p cholecystectomy with oversew cystic duct 10/16/2020 Active Problems:   Hypertrophic obstructive cardiomyopathy (HCC)   HTN (hypertension)   Hypothyroidism   Obesity   MGUS (monoclonal gammopathy of unknown significance)   Atrial fibrillation (HCC)   Pacemaker   Hypokalemia   Hyponatremia   Transaminitis   Choledocholithiasis   Memory difficulty   Chronic anticoagulation  Abnormal LFT's/Transaminitis improving Hyperbilirubinemia, improving -No  CBD dilation on CT.  Other diagnostic considerations include Augmentin induced cholestasis.   -Of note, the stone was described as >3cm on imaging last hospitalization. -Continue to Hold statin -Trend LFTs and AST went from 403 -> 224 -> 118 -> 71 now 72 today and ALT went from 150 -> 179 -> 124 -> 93 and is now 95 -T Bili worsened and went from 2.6 -> 3.7  -> 1.7 -> 1.4 and is now 1.0 -GI and General Surgery Consulted -Consulted GI re: MRCP vs ERCP; if MRCP needed, would need transfer to [facility] for MR compatible pacer monitoring and I spoke with Dr. Cristina Gong personally who feels that she does not need an MRCP given her pacer and does not feel that she needs a perioperative ERCP or EUS at this time and recommend proceeding with a surgery and intraoperative cholangiogram at that is positive then at that time they were doing ERCP -She underwent a laparoscopic cholecystectomy with attempted intraoperative cholangiogram on 10/16/2020 but general surgery is very concerned that she is at a very high risk for delayed postoperative bile leak given that she had horrible tissues requiring oversew of the cystic duct by Dr. Hassell Done.  We will Continue to follow LFTs and follow the drain output present she is at very high risk for cystic duct stump leak and if the drainage is bilious or concerning they may end up getting a HIDA scan to evaluate leak and may need an ERCP with stenting -General surgery recommending continue IV antibiotics with Zosyn for 5 days  Acute on chronic cholecystitis status post laparoscopic lysis of adhesions with cholecystectomy postoperative day 1 -PERC Chole drain removed. -Monitor WBC and fever curve; she is afebrile and has no leukocytosis as her WBC is 9.5 -Initially was going to consult IR to replace drain (last dose Eliquis 3/7 in PM) however she did not undergo a cholecystectomy on Friday so we do not feel the need to reconsult IR -Follow blood cultures x2; they showed no growth to date at 3 days -Further care per general surgery and she is on a clear liquid diet now -General Surgery started the patient on IV antibiotics with IV ceftriaxone and he has been switched to IV Zosyn now -She underwent a laparoscopic cholecystectomy with attempted intraoperative cholangiogram on 10/16/2020 but general surgery is very concerned that she is at a very  high risk for delayed postoperative bile leak given that she had horrible tissues requiring oversew of the cystic duct by Dr. Hassell Done.  Continue to follow LFTs and follow the drain output present she is at very high risk for cystic duct stump leak and if the drainage is bilious or concerning they may end up getting a HIDA scan to evaluate leak and may need an ERCP with stenting -General surgery recommendin -Will need PT/OT to evaluate and Treat and this can be done after her surgery  Hyponatremia -Asymptomatic. -Sodium has now dropped to 132 -IV fluid hydration is now stopped but will be restarted with normal saline at 75 MLS per hour -Continue to monitor and trend and repeat CMP in a.m.  Hypokalemia -Improved as patient's potassium level of 3.0 is now 4.2 today -Continue to monitor and replete as necessary -IV fluid hydration is now stopped -Repeat CMP in a.m. -Mag level was 1.9 this morning  Hypomagnesemia -Patient's magnesium level was 1.7 -Replete with IV mag sulfate 2 g -Continue to monitor and replete as necessary -Repeat magnesium level in a.m.  Hypothyroidism -Continue Levothyroxine with 125 mcg  p.o. Monday Tuesday Wednesday Thursday Friday Saturday and 62.5 mcg p.o. every Sunday -Check TSH and was 8.431 -We will check a free T4 and was 0.92 -We will not make any adjustments given that she is hospitalized but will need to have her repeat her thyroid function tests in about 4 to 6 weeks  Permanent Atrial Fibrillation -CHA2DS2-Vasc 4.  Given that low risk profile, bridging anticoagulation will more likely raise her risk of perioperative bleeding, and is unlikely to prevent a thromboembolic event (Douketsis Charolette Child 2015, Ayoub J Stroke Cerebrovasc Dis 2016). -Her rates are controlled -Continue to Hold apixaban for now per general surgery recommendations given high risk for bile leak  Essential Hypertension -Continue extremely low-dose hydrochlorothiazide 12.5 mg  p.o. daily -IV fluid hydration is now stopped -Continue to monitor blood pressures per protocol -Last blood pressure reading was 143/77  Thrombocytosis -Patient's platelet count is slowly trending up and went from 269 -> 289 -> 321-> 422 -Likely from Above -Continue to Monitor and Trend -Repeat CBC in the AM   MGUS -Currently Inactive and Clinically stable  HOCM -Avoid fluid shifts perioperatively to the extent able -Hold HCTZ for now as she will be getting IVF resumed as above   DVT prophylaxis: SCDs Code Status: DO NOT RESUSCITATE  Family Communication: Discussed with Daughter at bedside  Disposition Plan: Pending further clinical improvement and clearance by General Surgery and Gastroenterology   Status is: Inpatient  Remains inpatient appropriate because:Unsafe d/c plan, IV treatments appropriate due to intensity of illness or inability to take PO and Inpatient level of care appropriate due to severity of illness   Dispo: The patient is from: Home              Anticipated d/c is to: TBD              Patient currently is not medically stable to d/c.   Difficult to place patient No  Consultants:   Gastroenterology  General Surgery    Procedures: None will be going for a laparoscopic cholecystectomy with intraoperative cholangiogram tomorrow  Antimicrobials:  Anti-infectives (From admission, onward)   Start     Dose/Rate Route Frequency Ordered Stop   10/17/20 1200  piperacillin-tazobactam (ZOSYN) IVPB 3.375 g        3.375 g 12.5 mL/hr over 240 Minutes Intravenous Every 8 hours 10/17/20 1041 10/22/20 1159   10/17/20 1000  cefTRIAXone (ROCEPHIN) 2 g in sodium chloride 0.9 % 100 mL IVPB  Status:  Discontinued        2 g 200 mL/hr over 30 Minutes Intravenous Every 24 hours 10/16/20 1639 10/17/20 1041   10/15/20 1200  cefTRIAXone (ROCEPHIN) 2 g in sodium chloride 0.9 % 100 mL IVPB  Status:  Discontinued        2 g 200 mL/hr over 30 Minutes Intravenous Every 24 hours  10/15/20 0948 10/16/20 1639        Subjective: Seen and examined at bedside feels okay but was little confused today.  Had her surgery yesterday and denies any nausea and states that she did have some abdominal soreness.  Now does have an abdominal drain in place.  No chest pain or shortness breath.  No other concerns or complaints at this time.  Objective: Vitals:   10/16/20 1825 10/16/20 2020 10/17/20 0056 10/17/20 0532  BP: (!) 109/59 (!) 149/69 (!) 146/76 (!) 157/70  Pulse: 60 60 60 60  Resp: 18 18 14 18   Temp: 98.2 F (36.8 C) 98.1 F (36.7  C)  (!) 97.4 F (36.3 C)  TempSrc: Oral Oral  Oral  SpO2: 97% 98% 92% 91%  Weight:      Height:        Intake/Output Summary (Last 24 hours) at 10/17/2020 1308 Last data filed at 10/17/2020 1000 Gross per 24 hour  Intake 1630 ml  Output 950 ml  Net 680 ml   Filed Weights   10/13/20 0346  Weight: 76.2 kg   Examination: Physical Exam:  Constitutional: WN/WD overweight Caucasian female currently in no acute distress appears calm but is little confused Eyes: Lids and conjunctivae normal, sclerae anicteric  ENMT: External Ears, Nose appear normal. Grossly normal hearing Neck: Appears normal, supple, no cervical masses, normal ROM, no appreciable thyromegaly; no JVD Respiratory: Diminished to auscultation bilaterally, no wheezing, rales, rhonchi or crackles. Normal respiratory effort and patient is not tachypenic. No accessory muscle use. Not wearing any supplemental O2 via [place] Cardiovascular: Irregularly Irregular, no murmurs / rubs / gallops. S1 and S2 auscultated. Trace Extremity Edema Abdomen: Soft, tender to palpate, Distended 2/2 to body habitus and has a drain in place. Abdominal Incisions appear C/D/I Bowel sounds positive.  GU: Deferred. Musculoskeletal: No clubbing / cyanosis of digits/nails. No joint deformity upper and lower extremities.  Skin: No rashes, lesions, ulcers. No induration; Warm and dry.  Neurologic: CN 2-12  grossly intact with no focal deficits. Romberg and sign cerebellar reflexes not assessed.  Psychiatric: Normal judgment and insight.  She is awake and alert but was confused today. Normal mood and appropriate affect.   Data Reviewed: I have personally reviewed following labs and imaging studies  CBC: Recent Labs  Lab 10/13/20 0345 10/14/20 0441 10/15/20 0503 10/16/20 0519 10/17/20 0531  WBC 8.0 5.4 5.5 5.5 9.5  NEUTROABS 6.1  --  3.6 3.2 8.0*  HGB 11.3* 10.2* 9.8* 10.4* 12.6  HCT 32.6* 30.6* 29.3* 30.6* 37.3  MCV 92.1 95.9 95.4 93.0 94.7  PLT 289 269 289 321 938*   Basic Metabolic Panel: Recent Labs  Lab 10/13/20 0345 10/13/20 1817 10/14/20 0441 10/15/20 0503 10/16/20 0519 10/17/20 0531  NA 130*  --  135 136 135 132*  K 3.0* 4.0 4.4 3.8 3.7 4.2  CL 98  --  104 107 103 98  CO2 23  --  23 22 24 23   GLUCOSE 129*  --  102* 99 98 127*  BUN 9  --  7* 6* <5* 8  CREATININE 0.65  --  0.70 0.66 0.70 0.77  CALCIUM 8.8*  --  8.7* 8.8* 8.8* 9.1  MG  --   --   --  1.5* 1.9 1.7  PHOS  --   --   --  2.9 3.6 5.0*   GFR: Estimated Creatinine Clearance: 57 mL/min (by C-G formula based on SCr of 0.77 mg/dL). Liver Function Tests: Recent Labs  Lab 10/13/20 0345 10/14/20 0441 10/15/20 0503 10/16/20 0519 10/17/20 0531  AST 403* 224* 118* 71* 72*  ALT 150* 179* 124* 93* 95*  ALKPHOS 211* 256* 221* 205* 228*  BILITOT 2.6* 3.7* 1.7* 1.4* 1.0  PROT 6.1* 5.2* 5.1* 5.4* 6.6  ALBUMIN 3.1* 2.6* 2.6* 2.7* 3.2*   Recent Labs  Lab 10/13/20 0345  LIPASE 23   No results for input(s): AMMONIA in the last 168 hours. Coagulation Profile: Recent Labs  Lab 10/13/20 0345  INR 1.6*   Cardiac Enzymes: No results for input(s): CKTOTAL, CKMB, CKMBINDEX, TROPONINI in the last 168 hours. BNP (last 3 results) No results for input(s):  PROBNP in the last 8760 hours. HbA1C: No results for input(s): HGBA1C in the last 72 hours. CBG: No results for input(s): GLUCAP in the last 168 hours. Lipid  Profile: No results for input(s): CHOL, HDL, LDLCALC, TRIG, CHOLHDL, LDLDIRECT in the last 72 hours. Thyroid Function Tests: Recent Labs    10/16/20 0519 10/17/20 0531  TSH 8.431*  --   FREET4  --  0.92   Anemia Panel: No results for input(s): VITAMINB12, FOLATE, FERRITIN, TIBC, IRON, RETICCTPCT in the last 72 hours. Sepsis Labs: Recent Labs  Lab 10/13/20 0345 10/13/20 0518  LATICACIDVEN 0.9 1.0    Recent Results (from the past 240 hour(s))  Blood Culture (routine x 2)     Status: None (Preliminary result)   Collection Time: 10/13/20  3:45 AM   Specimen: BLOOD  Result Value Ref Range Status   Specimen Description   Final    BLOOD LEFT ANTECUBITAL Performed at [facility] [address], [place], [place] [address]    Special Requests   Final    BOTTLES DRAWN AEROBIC AND ANAEROBIC Blood Culture adequate volume Performed at [facility] [address], [place], [place] [address]    Culture   Final    NO GROWTH 3 DAYS Performed at [facility], [place] [address], [place], [place] [address]    Report Status PENDING  Incomplete  Blood Culture (routine x 2)     Status: None (Preliminary result)   Collection Time: 10/13/20  3:45 AM   Specimen: BLOOD  Result Value Ref Range Status   Specimen Description   Final    BLOOD BLOOD LEFT FOREARM Performed at [facility] [address] [place], [place] [address]    Special Requests   Final    BOTTLES DRAWN AEROBIC AND ANAEROBIC Blood Culture adequate volume Performed at [facility] [address], [place], [place] [address]    Culture   Final    NO GROWTH 3 DAYS Performed at [facility], [place] [address], [place], [place] [address]    Report Status PENDING  Incomplete  SARS CORONAVIRUS 2 (TAT 6-24 HRS) Nasopharyngeal Nasopharyngeal Swab     Status: None   Collection Time: 10/13/20  8:00 AM   Specimen: Nasopharyngeal Swab  Result Value  Ref Range Status   SARS Coronavirus 2 NEGATIVE NEGATIVE Final    Comment: (NOTE) SARS-CoV-2 target nucleic acids are NOT DETECTED.  The SARS-CoV-2 RNA is generally detectable in upper and lower respiratory specimens during the acute phase of infection. Negative results do not preclude SARS-CoV-2 infection, do not rule out co-infections with other pathogens, and should not be used as the sole basis for treatment or other patient management decisions. Negative results must be combined with clinical observations, patient history, and epidemiological information. The expected result is Negative.  Fact Sheet for Patients: SugarRoll.be  Fact Sheet for Healthcare Providers: https://www.woods-mathews.com/  This test is not yet approved or cleared by the [place] FDA and  has been authorized for detection and/or diagnosis of SARS-CoV-2 by FDA under an Emergency Use Authorization (EUA). This EUA will remain  in effect (meaning this test can be used) for the duration of the COVID-19 declaration under Se ction 564(b)(1) of the Act, 21 U.S.C. section 360bbb-3(b)(1), unless the authorization is terminated or revoked sooner.  Performed at [facility], [place] [address], [place],  [address]   MRSA PCR Screening     Status: None  Collection Time: 10/15/20  6:01 AM   Specimen: Nasal Mucosa; Nasopharyngeal  Result Value Ref Range Status   MRSA by PCR NEGATIVE NEGATIVE Final    Comment:        The GeneXpert MRSA Assay (FDA approved for NASAL specimens only), is one component of a comprehensive MRSA colonization surveillance program. It is not intended to diagnose MRSA infection nor to guide or monitor treatment for MRSA infections. Performed at [facility], [place] [address] [place] [address] [place], [place] [address]     RN Pressure Injury Documentation:     Estimated body mass index is 28.84 kg/m as calculated from the  following:   Height as of this encounter: 5\' 4"  (1.626 m).   Weight as of this encounter: 76.2 kg.  Malnutrition Type:   Malnutrition Characteristics:   Nutrition Interventions:     Radiology Studies: DG Cholangiogram Operative  Result Date: 10/16/2020 CLINICAL DATA:  Attempted cholangiogram EXAM: INTRAOPERATIVE CHOLANGIOGRAM TECHNIQUE: Cholangiographic images from the C-arm fluoroscopic device were submitted for interpretation post-operatively. Please see the procedural report for the amount of contrast and the fluoroscopy time utilized. COMPARISON:  October 13, 2020 FINDINGS: Injection of contrast did not opacify the biliary tree. Only extraluminal contrast was visualized. The biliary tree cannot be adequately assessed on this exam. One cine clip was submitted for evaluation. The total fluoroscopy time was 18 seconds. The total dose was 5.04 mGy. IMPRESSION: Nondiagnostic cholangiogram. Electronically Signed   By: Constance Holster M.D.   On: 10/16/2020 14:26   Scheduled Meds: . acetaminophen  500 mg Oral TID WC & HS  . feeding supplement  237 mL Oral BID BM  . hydrochlorothiazide  12.5 mg Oral Daily  . levothyroxine  125 mcg Oral Once per day on Mon Tue Wed Thu Fri Sat  . [START ON 10/18/2020] levothyroxine  62.5 mcg Oral Q Sun  . lip balm  1 application Topical BID  . polycarbophil  625 mg Oral BID  . sodium chloride flush  3 mL Intravenous Q12H   Continuous Infusions: . sodium chloride    . albumin human    . lactated ringers    . magnesium sulfate bolus IVPB 2 g (10/17/20 1251)  . methocarbamol (ROBAXIN) IV    . piperacillin-tazobactam (ZOSYN)  IV      LOS: 3 days   Kerney Elbe, DO [facility] PAGER is on [place]  If 7PM-7AM, please contact night-coverage [email or web address]

## 2020-10-18 DIAGNOSIS — R7401 Elevation of levels of liver transaminase levels: Secondary | ICD-10-CM | POA: Diagnosis not present

## 2020-10-18 DIAGNOSIS — I4819 Other persistent atrial fibrillation: Secondary | ICD-10-CM | POA: Diagnosis not present

## 2020-10-18 DIAGNOSIS — I10 Essential (primary) hypertension: Secondary | ICD-10-CM | POA: Diagnosis not present

## 2020-10-18 DIAGNOSIS — I421 Obstructive hypertrophic cardiomyopathy: Secondary | ICD-10-CM | POA: Diagnosis not present

## 2020-10-18 LAB — CBC WITH DIFFERENTIAL/PLATELET
Abs Immature Granulocytes: 0.02 10*3/uL (ref 0.00–0.07)
Basophils Absolute: 0.1 10*3/uL (ref 0.0–0.1)
Basophils Relative: 1 %
Eosinophils Absolute: 0.2 10*3/uL (ref 0.0–0.5)
Eosinophils Relative: 3 %
HCT: 30.2 % — ABNORMAL LOW (ref 36.0–46.0)
Hemoglobin: 10.2 g/dL — ABNORMAL LOW (ref 12.0–15.0)
Immature Granulocytes: 0 %
Lymphocytes Relative: 25 %
Lymphs Abs: 1.9 10*3/uL (ref 0.7–4.0)
MCH: 32 pg (ref 26.0–34.0)
MCHC: 33.8 g/dL (ref 30.0–36.0)
MCV: 94.7 fL (ref 80.0–100.0)
Monocytes Absolute: 0.6 10*3/uL (ref 0.1–1.0)
Monocytes Relative: 7 %
Neutro Abs: 5.1 10*3/uL (ref 1.7–7.7)
Neutrophils Relative %: 64 %
Platelets: 349 10*3/uL (ref 150–400)
RBC: 3.19 MIL/uL — ABNORMAL LOW (ref 3.87–5.11)
RDW: 13.3 % (ref 11.5–15.5)
WBC: 7.9 10*3/uL (ref 4.0–10.5)
nRBC: 0 % (ref 0.0–0.2)

## 2020-10-18 LAB — COMPREHENSIVE METABOLIC PANEL
ALT: 64 U/L — ABNORMAL HIGH (ref 0–44)
AST: 45 U/L — ABNORMAL HIGH (ref 15–41)
Albumin: 2.6 g/dL — ABNORMAL LOW (ref 3.5–5.0)
Alkaline Phosphatase: 163 U/L — ABNORMAL HIGH (ref 38–126)
Anion gap: 8 (ref 5–15)
BUN: 8 mg/dL (ref 8–23)
CO2: 25 mmol/L (ref 22–32)
Calcium: 8.6 mg/dL — ABNORMAL LOW (ref 8.9–10.3)
Chloride: 98 mmol/L (ref 98–111)
Creatinine, Ser: 0.81 mg/dL (ref 0.44–1.00)
GFR, Estimated: >60 mL/min (ref >60)
Glucose, Bld: 92 mg/dL (ref 70–99)
Potassium: 3.5 mmol/L (ref 3.5–5.1)
Sodium: 131 mmol/L — ABNORMAL LOW (ref 135–145)
Total Bilirubin: 1 mg/dL (ref 0.3–1.2)
Total Protein: 5.6 g/dL — ABNORMAL LOW (ref 6.5–8.1)

## 2020-10-18 LAB — CULTURE, BLOOD (ROUTINE X 2)
Culture: NO GROWTH
Culture: NO GROWTH
Special Requests: ADEQUATE
Special Requests: ADEQUATE

## 2020-10-18 LAB — MAGNESIUM: Magnesium: 1.7 mg/dL (ref 1.7–2.4)

## 2020-10-18 LAB — LIPASE, BLOOD: Lipase: 23 U/L (ref 11–51)

## 2020-10-18 LAB — PHOSPHORUS: Phosphorus: 3.4 mg/dL (ref 2.5–4.6)

## 2020-10-18 MED ORDER — MAGNESIUM SULFATE 2 GM/50ML IV SOLN
2.0000 g | Freq: Once | INTRAVENOUS | Status: AC
  Administered 2020-10-18: 2 g via INTRAVENOUS
  Filled 2020-10-18: qty 50

## 2020-10-18 MED ORDER — ACETAMINOPHEN 500 MG PO TABS
1000.0000 mg | ORAL_TABLET | Freq: Three times a day (TID) | ORAL | Status: DC
  Administered 2020-10-18 – 2020-10-24 (×16): 1000 mg via ORAL
  Filled 2020-10-18 (×16): qty 2

## 2020-10-18 NOTE — Progress Notes (Signed)
Tracey Mcclure [id number] 05-10-1941  CARE TEAM:  PCP: Crist Infante, MD  Outpatient Care Team: Patient Care Team: Crist Infante, MD as PCP - General (Internal Medicine) Martinique, Peter M, MD as PCP - Cardiology (Cardiology) Heath Lark, MD as Consulting Physician (Hematology and Oncology)  Inpatient Treatment Team: Treatment Team: Attending Provider: Kerney Elbe, DO; Rounding Team: Edison Pace, Md, MD; Consulting Physician: Ronald Lobo, MD; Rounding Team: Fatima Blank, MD; Technician: Ernest Mallick, NT; Utilization Review: Orlean Bradford, RN; Registered Nurse: Heloise Ochoa, RN; Technician: Karna Christmas, NT   Problem List:   Principal Problem:   Acute calculous cholecystitis s/p cholecystectomy with oversew cystic duct 10/16/2020 Active Problems:   Hypertrophic obstructive cardiomyopathy (HCC)   HTN (hypertension)   Hypothyroidism   Obesity   MGUS (monoclonal gammopathy of unknown significance)   Atrial fibrillation ([place])   Pacemaker   Hypokalemia   Hyponatremia   Transaminitis   Choledocholithiasis   Memory difficulty   Chronic anticoagulation   2 Days Post-Op  10/16/2020  Procedure:      Laparoscopic Cholecystectomy-(anterior then posterior wall and separate extraction of 3.6 cm stone) with attempted intraoperative cholangiogram-extravasation.    Surgeon:         Althea Grimmer. Hassell Done, MD, FACS Asst:                none  Anes:               General  Drains:             [address]  Findings:         Severe subacute cholecystitis with large stone   Assessment  Recovering relatively well but high risk for delayed postop bile leak  [facility] Stay = 4 days)  Assessment/Plan Atrial fibrillation on chronic anticoagulation last dose Eliquis p.m. 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  Horrible tissues requiring oversew of cystic duct by Dr. Hassell Done 10/16/2020.  Follow drain output since very high risk for cystic duct stump leak. - more serosang today   Follow liver function test If drainage is bilious or concerning, may get HIDA scan.  Evaluate leak may need ERCP with stenting. Pain control - inc tylenol scheduled & follow LFTs IV antibiotics.  Zosyn x5 more days.  FEN: ADAT Heart.  ML IVF ID: None DVT: Eliquis last dose p.m. 10/12/2020/SCD.  Hold anticoagulation for now. - prob restart 3/14 if better  Disposition:  Per [facility]  20 minutes spent in review, evaluation, examination, counseling, and coordination of care.   I have reviewed this patient's available data, including medical history, events of note, physical examination and test results as part of my evaluation.  A significant portion of that time was spent in counseling.  Care during the described time interval was provided by me.  10/18/2020    Subjective: (Chief complaint)  Patient tolerating fulls/dys1 diet  Denies much abdominal pain "just sore" - tylenol helps  No nausea or vomiting.  Objective:  Vital signs:  Vitals:   10/17/20 0532 10/17/20 1421 10/17/20 2045 10/18/20 0542  BP: (!) 157/70 (!) 157/69 119/61 124/71  Pulse: 60 62 61 (!) 59  Resp: 18 17  18 18  Temp: (!) 97.4 F (36.3 C) 98 F (36.7 C) 98.1 F (36.7 C) 98.3 F (36.8 C)  TempSrc: Oral Oral Oral Oral  SpO2: 91% 98% 96% 97%  Weight:      Height:        Last BM Date: 10/13/20  Intake/Output   Yesterday:  03/12 0701 - 03/13 0700 In: 6301.6 [P.O.:1560;  I.V.:906.2; IV Piggyback:100] Out: 2060 [Urine:1750; Drains:310] This shift:  No intake/output data recorded.  Bowel function:  Flatus: YES  BM:  No  Drain: Serosanguinous   Physical Exam:  General: Pt awake/alert in no acute distress Eyes: PERRL, normal EOM.  Sclera clear.  No icterus Neuro: CN II-XII intact w/o focal sensory/motor deficits. Lymph: No head/neck/groin lymphadenopathy Psych:  No delerium/psychosis/paranoia.  Oriented x 4.  Better memory recall HENT: Normocephalic, Mucus membranes moist.  No thrush Neck: Supple, No tracheal deviation.  No obvious thyromegaly Chest: No pain to chest wall compression.  Good respiratory excursion.  No audible wheezing CV:  Pulses intact.  Regular rhythm.  No major extremity edema MS: Normal AROM mjr joints.  No obvious deformity  Abdomen: Soft.  Nondistended.  Mildly tender at incisions only.  No evidence of peritonitis.  No incarcerated hernias.  Ext:  No deformity.  No mjr edema.  No cyanosis Skin: No petechiae / purpurea.  No major sores.  Warm and dry    Results:   Cultures: Recent Results (from the past 720 hour(s))  Blood Culture (routine x 2)     Status: None (Preliminary result)   Collection Time: 10/13/20  3:45 AM   Specimen: BLOOD  Result Value Ref Range Status   Specimen Description   Final    BLOOD LEFT ANTECUBITAL Performed at [facility] [address] [place], [place] [address]    Special Requests   Final    BOTTLES DRAWN AEROBIC AND ANAEROBIC Blood Culture adequate volume Performed at [facility] [address] [place], [place] [address]    Culture   Final    NO GROWTH 4 DAYS Performed at [facility], [place] [address] [place], [place] [address]    Report Status PENDING  Incomplete  Blood Culture (routine x 2)     Status: None (Preliminary result)   Collection Time: 10/13/20  3:45 AM   Specimen: BLOOD  Result Value Ref Range Status   Specimen  Description   Final    BLOOD BLOOD LEFT FOREARM Performed at [place] [address] [place], [place] [address]    Special Requests   Final    BOTTLES DRAWN AEROBIC AND ANAEROBIC Blood Culture adequate volume Performed at [place] [address] [place], [place] [address]    Culture   Final    NO GROWTH 4 DAYS Performed at [facility], [place] [address], [place],  [address]    Report Status PENDING  Incomplete  SARS CORONAVIRUS 2 (TAT 6-24 HRS) Nasopharyngeal Nasopharyngeal Swab     Status: None   Collection Time: 10/13/20  8:00 AM   Specimen: Nasopharyngeal Swab  Result Value Ref Range Status   SARS Coronavirus 2 NEGATIVE NEGATIVE Final    Comment: (NOTE) SARS-CoV-2 target nucleic acids are NOT DETECTED.  The SARS-CoV-2 RNA is generally detectable in upper and lower respiratory specimens during the acute phase of infection. Negative results do not preclude SARS-CoV-2 infection, do not rule out co-infections with other pathogens, and should not be used  as the sole basis for treatment or other patient management decisions. Negative results must be combined with clinical observations, patient history, and epidemiological information. The expected result is Negative.  Fact Sheet for Patients: SugarRoll.be  Fact Sheet for Healthcare Providers: https://www.woods-mathews.com/  This test is not yet approved or cleared by the [place] FDA and  has been authorized for detection and/or diagnosis of SARS-CoV-2 by FDA under an Emergency Use Authorization (EUA). This EUA will remain  in effect (meaning this test can be used) for the duration of the COVID-19 declaration under Se ction 564(b)(1) of the Act, 21 U.S.C. section 360bbb-3(b)(1), unless the authorization is terminated or revoked sooner.  Performed at [facility], [place] [address], [place], [place] [address]   MRSA  PCR Screening     Status: None   Collection Time: 10/15/20  6:01 AM   Specimen: Nasal Mucosa; Nasopharyngeal  Result Value Ref Range Status   MRSA by PCR NEGATIVE NEGATIVE Final    Comment:        The GeneXpert MRSA Assay (FDA approved for NASAL specimens only), is one component of a comprehensive MRSA colonization surveillance program. It is not intended to diagnose MRSA infection nor to guide or monitor treatment for MRSA infections. Performed at [facility], [place] [address], [place], [place] [address]     Labs: Results for orders placed or performed during the hospital encounter of 10/13/20 (from the past 48 hour(s))  CBC with Differential/Platelet     Status: Abnormal   Collection Time: 10/17/20  5:31 AM  Result Value Ref Range   WBC 9.5 4.0 - 10.5 K/uL   RBC 3.94 3.87 - 5.11 MIL/uL   Hemoglobin 12.6 12.0 - 15.0 g/dL   HCT 37.3 36.0 - 46.0 %   MCV 94.7 80.0 - 100.0 fL   MCH 32.0 26.0 - 34.0 pg   MCHC 33.8 30.0 - 36.0 g/dL   RDW 13.2 11.5 - 15.5 %   Platelets 422 (H) 150 - 400 K/uL   nRBC 0.0 0.0 - 0.2 %   Neutrophils Relative % 85 %   Neutro Abs 8.0 (H) 1.7 - 7.7 K/uL   Lymphocytes Relative 11 %   Lymphs Abs 1.0 0.7 - 4.0 K/uL   Monocytes Relative 4 %   Monocytes Absolute 0.4 0.1 - 1.0 K/uL   Eosinophils Relative 0 %   Eosinophils Absolute 0.0 0.0 - 0.5 K/uL   Basophils Relative 0 %   Basophils Absolute 0.0 0.0 - 0.1 K/uL   Immature Granulocytes 0 %   Abs Immature Granulocytes 0.03 0.00 - 0.07 K/uL    Comment: Performed at [facility], [place] [address] [place], [place] [address]  Comprehensive metabolic panel     Status: Abnormal   Collection Time: 10/17/20  5:31 AM  Result Value Ref Range   Sodium 132 (L) 135 - 145 mmol/L   Potassium 4.2 3.5 - 5.1 mmol/L   Chloride 98 98 - 111 mmol/L   CO2 23 22 - 32 mmol/L   Glucose, Bld 127 (H) 70 - 99 mg/dL    Comment: Glucose reference range applies only to samples taken after  fasting for at least 8 hours.   BUN 8 8 - 23 mg/dL   Creatinine, Ser 0.77 0.44 - 1.00 mg/dL   Calcium 9.1 8.9 - 10.3 mg/dL   Total Protein 6.6 6.5 - 8.1 g/dL   Albumin 3.2 (L) 3.5 - 5.0 g/dL   AST 72 (H) 15 -  41 U/L   ALT 95 (H) 0 - 44 U/L   Alkaline Phosphatase 228 (H) 38 - 126 U/L   Total Bilirubin 1.0 0.3 - 1.2 mg/dL   GFR, Estimated >60 >60 mL/min    Comment: (NOTE) Calculated using the CKD-EPI Creatinine Equation (2021)    Anion gap 11 5 - 15    Comment: Performed at [facility], [place] [address], [place], [place] [address]  Phosphorus     Status: Abnormal   Collection Time: 10/17/20  5:31 AM  Result Value Ref Range   Phosphorus 5.0 (H) 2.5 - 4.6 mg/dL    Comment: Performed at [facility], [place] [address], [place], [place] [address]  Magnesium     Status: None   Collection Time: 10/17/20  5:31 AM  Result Value Ref Range   Magnesium 1.7 1.7 - 2.4 mg/dL    Comment: Performed at [facility], [place] [address], [place], [place] [address]  T4, free     Status: None   Collection Time: 10/17/20  5:31 AM  Result Value Ref Range   Free T4 0.92 0.61 - 1.12 ng/dL    Comment: (NOTE) Biotin ingestion may interfere with free T4 tests. If the results are inconsistent with the TSH level, previous test results, or the clinical presentation, then consider biotin interference. If needed, order repeat testing after stopping biotin. Performed at [facility], [place] [address] [place], [place] [address]   CBC with Differential/Platelet     Status: Abnormal   Collection Time: 10/18/20  5:58 AM  Result Value Ref Range   WBC 7.9 4.0 - 10.5 K/uL   RBC 3.19 (L) 3.87 - 5.11 MIL/uL   Hemoglobin 10.2 (L) 12.0 - 15.0 g/dL   HCT 30.2 (L) 36.0 - 46.0 %   MCV 94.7 80.0 - 100.0 fL   MCH 32.0 26.0 - 34.0 pg   MCHC 33.8 30.0 - 36.0 g/dL   RDW 13.3 11.5 - 15.5 %   Platelets 349 150 - 400 K/uL   nRBC 0.0 0.0 - 0.2 %   Neutrophils Relative %  64 %   Neutro Abs 5.1 1.7 - 7.7 K/uL   Lymphocytes Relative 25 %   Lymphs Abs 1.9 0.7 - 4.0 K/uL   Monocytes Relative 7 %   Monocytes Absolute 0.6 0.1 - 1.0 K/uL   Eosinophils Relative 3 %   Eosinophils Absolute 0.2 0.0 - 0.5 K/uL   Basophils Relative 1 %   Basophils Absolute 0.1 0.0 - 0.1 K/uL   Immature Granulocytes 0 %   Abs Immature Granulocytes 0.02 0.00 - 0.07 K/uL    Comment: Performed at [facility], [place] [address], [place], [place] [address]    Imaging / Studies: DG Cholangiogram Operative  Result Date: 10/16/2020 CLINICAL DATA:  Attempted cholangiogram EXAM: INTRAOPERATIVE CHOLANGIOGRAM TECHNIQUE: Cholangiographic images from the C-arm fluoroscopic device were submitted for interpretation post-operatively. Please see the procedural report for the amount of contrast and the fluoroscopy time utilized. COMPARISON:  October 13, 2020 FINDINGS: Injection of contrast did not opacify the biliary tree. Only extraluminal contrast was visualized. The biliary tree cannot be adequately assessed on this exam. One cine clip was submitted for evaluation. The total fluoroscopy time was 18 seconds. The total dose was 5.04 mGy. IMPRESSION: Nondiagnostic cholangiogram. Electronically Signed   By: Constance Holster M.D.   On: 10/16/2020 14:26    Medications / Allergies: per chart  Antibiotics: Anti-infectives (From admission, onward)  Start     Dose/Rate Route Frequency Ordered Stop   10/17/20 1200  piperacillin-tazobactam (ZOSYN) IVPB 3.375 g        3.375 g 12.5 mL/hr over 240 Minutes Intravenous Every 8 hours 10/17/20 1041 10/22/20 1159   10/17/20 1000  cefTRIAXone (ROCEPHIN) 2 g in sodium chloride 0.9 % 100 mL IVPB  Status:  Discontinued        2 g 200 mL/hr over 30 Minutes Intravenous Every 24 hours 10/16/20 1639 10/17/20 1041   10/15/20 1200  cefTRIAXone (ROCEPHIN) 2 g in sodium chloride 0.9 % 100 mL IVPB  Status:  Discontinued        2 g 200 mL/hr over 30 Minutes  Intravenous Every 24 hours 10/15/20 0948 10/16/20 1639        Note: Portions of this report may have been transcribed using voice recognition software. Every effort was made to ensure accuracy; however, inadvertent computerized transcription errors may be present.   Any transcriptional errors that result from this process are unintentional.    Adin Hector, MD, FACS, MASCRS  Gastrointestinal and Minimally Invasive Surgery  [facility] [address], [place], [place] [address] [phone] Fax [phone] Main/Paging  CONTACT INFORMATION: Weekday (9AM-5PM) concerns: Call [facility] main office at [phone] Weeknight (5PM-9AM) or Weekend/Holiday concerns: Check [email or web address] for General Surgery [facility] coverage (Please, do not use SecureChat as it is not reliable communication to operating surgeons for immediate patient care)      10/18/2020  7:32 AM

## 2020-10-18 NOTE — Progress Notes (Signed)
PROGRESS NOTE    Tracey Mcclure  QZE:[id number] DOB: 25-Feb-1941 DOA: 10/13/2020 PCP: Crist Infante, MD   Brief Narrative:  The patient is a a 80 year old overweight Caucasian female with a past medical history significant for but not limited to permanent atrial fibrillation and sick sinus syndrome with a pacemaker in place, hokum, hypertension, MGUS, hypothyroidism, as well as other comorbidities who had a recent cholecystitis and presented back to the hospital with abdominal pain, nausea and malaise.  She had been admitted 6 weeks ago with cholecystitis and treated with antibiotics and had a PERC drain in place.  She is doing well and was active and eating a normal diet until the day before admission.  She went to the IR clinic for drain evaluation and cholangiogram but it obtained and is determined that the existing tube was malpositioned but in trying to reposition it access was lost and attempt to replace the tube failed.  The few hours of the procedure patient was sent home and started to have epigastric pain, nausea and subjective fevers.  This persisted and was so severe that she came to the ER for further evaluation.  In the ER she was noted to be afebrile with a normal white blood cell count and had a CT of the abdomen pelvis done which showed no fluid collection and had no findings of chronic cholecystitis.  She did have abnormal LFTs so both general surgery and gastroenterology were both consulted for further evaluation recommendations.  Gastroenterology feels that her it would be difficult to get a MRCP as the patient has a pacemaker and they are recommending proceeding with a cholecystectomy with an [place] currently her Eliquis is on hold.  She was on a clear liquid diet for now and made n.p.o. after midnight and general surgery planning on taking the patient for surgical intervention likely on Friday, 10/16/2020.  GI recommends following up with the [facility] and does not recommend up preoperative ERCP or  EUS at this time and they have signed off the case now.  She was started on IV Ceftriaxone on 10/15/20 preoperatively by general surgery and underwent surgical intervention on 10/16/20.  LFTs and bilirubin has continued to improve now and LFTs have stablized.  GI feels that she may have passed a common duct stone or have one there is more ball valving.   She is status post laparoscopic cholecystectomy with attempted intraoperative cholangiogram and now has a Blake drain in place he also had laparoscopic lysis of adhesions and apparently had horrible tissues requiring to oversew the cystic duct.  General surgery is concerned that she is at high risk for cystic duct stump bile leak and recommending following drain output and following liver functions.  Per their recommendation if the drainage is bilious or concerning he may get a HIDA scan to evaluate for leak and she may need an ERCP with stenting still.  They are recommending clear liquid diet for now and IV Zosyn for 5 days and holding off on Anticoagulation currently.   Assessment & Plan:   Principal Problem:   Acute calculous cholecystitis s/p cholecystectomy with oversew cystic duct 10/16/2020 Active Problems:   Hypertrophic obstructive cardiomyopathy (HCC)   HTN (hypertension)   Hypothyroidism   Obesity   MGUS (monoclonal gammopathy of unknown significance)   Atrial fibrillation (HCC)   Pacemaker   Hypokalemia   Hyponatremia   Transaminitis   Choledocholithiasis   Memory difficulty   Chronic anticoagulation  Abnormal LFT's/Transaminitis improving Hyperbilirubinemia, improving -No  CBD dilation on CT.  Other diagnostic considerations include Augmentin induced cholestasis.   -Of note, the stone was described as >3cm on imaging last hospitalization. -Continue to Hold statin -Trend LFTs and AST went from 403 -> 224 -> 118 -> 71 -> 72 -> 45 today and ALT went from 150 -> 179 -> 124 -> 93 -> 95 -> 64 -T Bili worsened and went from 2.6 ->  3.7 -> 1.7 -> 1.4 and is now 1.0 -GI and General Surgery Consulted -Consulted GI re: MRCP vs ERCP; if MRCP needed, would need transfer to [facility] for MR compatible pacer monitoring and I spoke with Dr. Cristina Gong personally who feels that she does not need an MRCP given her pacer and does not feel that she needs a perioperative ERCP or EUS at this time and recommend proceeding with a surgery and intraoperative cholangiogram at that is positive then at that time they were doing ERCP -She underwent a laparoscopic cholecystectomy with attempted intraoperative cholangiogram on 10/16/2020 but general surgery aws very concerned that she is at a very high risk for delayed postoperative bile leak given that she had horrible tissues requiring oversew of the cystic duct by Dr. Hassell Done.  We will Continue to follow LFTs and follow the drain output present she is at very high risk for cystic duct stump leak and if the drainage is bilious or concerning they may end up getting a HIDA scan to evaluate leak and may need an ERCP with stenting; General Surgery today feels output is more Serosanguinous and recommends close monitoring; Possible Resumption of Anticoagulation in the AM  -General surgery recommending continue IV antibiotics with Zosyn for 5 days (Day 2/5) and likley to be changed to po   Acute on chronic cholecystitis status post laparoscopic lysis of adhesions with cholecystectomy postoperative day 2 -PERC Chole drain had been removed but now has [employer] after surgery  -Monitor WBC and fever curve; she is afebrile and has no leukocytosis as her WBC is 7.9 -Initially was going to consult IR to replace drain (last dose Eliquis 3/7 in PM) however she did not undergo a cholecystectomy on Friday so we do not feel the need to reconsult IR -Follow blood cultures x2; they showed no growth to date at 3 days -Further care per general surgery and her Diet was advanced to a Heart Healthy Diet now  -General Surgery started  the patient on IV antibiotics with IV ceftriaxone and he has been switched to IV Zosyn now -She underwent a laparoscopic cholecystectomy with attempted intraoperative cholangiogram on 10/16/2020 but general surgery is very concerned that she is at a very high risk for delayed postoperative bile leak given that she had horrible tissues requiring oversew of the cystic duct by Dr. Hassell Done.  Continue to follow LFTs and follow the drain output present she is at very high risk for cystic duct stump leak and if the drainage is bilious or concerning they may end up getting a HIDA scan to evaluate leak and may need an ERCP with stenting -General Surgery Recommendations as above  -C/w Pain Control with Acetaminophen 1000 mg po TID, Oxycodone IR 2.5-5 mg po q4hprn, and Fentanyl 25-50 mcg IV q1hprn Severe Pain  -Will need PT/OT to evaluate and Treat  Hyponatremia -Asymptomatic. -Sodium has now dropped to 131 -IV fluid hydration has now stopped  -Continue to monitor and trend and repeat CMP in a.m.  Hypokalemia -Improved as patient's potassium level of 3.0 is now 3.5 today -Continue to monitor  and replete as necessary -IV fluid hydration is now stopped -Repeat CMP in a.m. -Mag level was 1.7 this morning  Hypomagnesemia -Patient's magnesium level was 1.7 -Replete with IV mag sulfate 2 g again  -Continue to monitor and replete as necessary -Repeat magnesium level in a.m.  Hypothyroidism -Continue Levothyroxine with 125 mcg p.o. Monday Tuesday Wednesday Thursday Friday Saturday and 62.5 mcg p.o. every Sunday -Check TSH and was 8.431 -We will check a free T4 and was 0.92 -We will not make any adjustments given that she is hospitalized but will need to have her repeat her thyroid function tests in about 4 to 6 weeks  Permanent Atrial Fibrillation -CHA2DS2-Vasc 4.  Given that low risk profile, bridging anticoagulation will more likely raise her risk of perioperative bleeding, and is unlikely to prevent  a thromboembolic event (Douketsis Charolette Child 2015, Ayoub J Stroke Cerebrovasc Dis 2016). -Her rates are controlled -Continue to Hold apixaban for now per general surgery recommendations given high risk for bile leak -Possible Resumption on 10/19/20 per General Surgery Reccs   Essential Hypertension -Continue extremely low-dose hydrochlorothiazide 12.5 mg p.o. daily -IV fluid hydration is now stopped -Continue to monitor blood pressures per protocol -Last blood pressure reading was 124/71  Thrombocytosis -Patient's platelet count is slowly trending up and went from 269 -> 289 -> 321-> 422 -> 349 -Likely from Above -Continue to Monitor and Trend -Repeat CBC in the AM   MGUS -Currently Inactive and Clinically stable  HOCM -Avoid fluid shifts perioperatively to the extent able -Hold HCTZ for now and resume in the AM   DVT prophylaxis: SCDs Code Status: DO NOT RESUSCITATE  Family Communication: No family present at bedside  Disposition Plan: Pending further clinical improvement and clearance by General Surgery and Gastroenterology   Status is: Inpatient  Remains inpatient appropriate because:Unsafe d/c plan, IV treatments appropriate due to intensity of illness or inability to take PO and Inpatient level of care appropriate due to severity of illness   Dispo: The patient is from: Home              Anticipated d/c is to: TBD              Patient currently is not medically stable to d/c.   Difficult to place patient No  Consultants:   Gastroenterology  General Surgery    Procedures: None will be going for a laparoscopic cholecystectomy with intraoperative cholangiogram tomorrow  Antimicrobials:  Anti-infectives (From admission, onward)   Start     Dose/Rate Route Frequency Ordered Stop   10/17/20 1200  piperacillin-tazobactam (ZOSYN) IVPB 3.375 g        3.375 g 12.5 mL/hr over 240 Minutes Intravenous Every 8 hours 10/17/20 1041 10/22/20 1159   10/17/20 1000   cefTRIAXone (ROCEPHIN) 2 g in sodium chloride 0.9 % 100 mL IVPB  Status:  Discontinued        2 g 200 mL/hr over 30 Minutes Intravenous Every 24 hours 10/16/20 1639 10/17/20 1041   10/15/20 1200  cefTRIAXone (ROCEPHIN) 2 g in sodium chloride 0.9 % 100 mL IVPB  Status:  Discontinued        2 g 200 mL/hr over 30 Minutes Intravenous Every 24 hours 10/15/20 0948 10/16/20 1639        Subjective: Seen and examined at bedside feels still a little foggy.  She complains of abdominal pain and soreness still.  No nausea or vomiting.  Drain is still putting out.  Denies any chest pain or shortness  breath.  No other concerns or complaints this time.  Objective: Vitals:   10/17/20 0532 10/17/20 1421 10/17/20 2045 10/18/20 0542  BP: (!) 157/70 (!) 157/69 119/61 124/71  Pulse: 60 62 61 (!) 59  Resp: 18 17 18 18   Temp: (!) 97.4 F (36.3 C) 98 F (36.7 C) 98.1 F (36.7 C) 98.3 F (36.8 C)  TempSrc: Oral Oral Oral Oral  SpO2: 91% 98% 96% 97%  Weight:      Height:        Intake/Output Summary (Last 24 hours) at 10/18/2020 1031 Last data filed at 10/18/2020 1000 Gross per 24 hour  Intake 2286.28 ml  Output 2220 ml  Net 66.28 ml   Filed Weights   10/13/20 0346  Weight: 76.2 kg   Examination: Physical Exam:  Constitutional: WN/WD overweight Caucasian female currently in no acute distress appears calm and is still slightly confused still Eyes: Lids and conjunctivae normal, sclerae anicteric  ENMT: External Ears, Nose appear normal. Grossly normal hearing. Neck: Appears normal, supple, no cervical masses, normal ROM, no appreciable thyromegaly; no JVD Respiratory: Diminished to auscultation bilaterally, no wheezing, rales, rhonchi or crackles. Normal respiratory effort and patient is not tachypenic. No accessory muscle use.  Unlabored breathing Cardiovascular: Irregularly irregular, no murmurs / rubs / gallops. S1 and S2 auscultated.  Has some trace extremity edema Abdomen: Soft, tender to  palpate, distended secondary body habitus and has a drain in place with serosanguineous drainage. Bowel sounds positive.  GU: Deferred. Musculoskeletal: No clubbing / cyanosis of digits/nails. No joint deformity upper and lower extremities.  Skin: No rashes, lesions, ulcers on limited skin evaluation. No induration; Warm and dry.  Neurologic: CN 2-12 grossly intact with no focal deficits. Romberg sign and cerebellar reflexes not assessed.  Psychiatric: Normal judgment and insight.  Still slightly confused. Normal mood and appropriate affect.   Data Reviewed: I have personally reviewed following labs and imaging studies  CBC: Recent Labs  Lab 10/13/20 0345 10/14/20 0441 10/15/20 0503 10/16/20 0519 10/17/20 0531 10/18/20 0558  WBC 8.0 5.4 5.5 5.5 9.5 7.9  NEUTROABS 6.1  --  3.6 3.2 8.0* 5.1  HGB 11.3* 10.2* 9.8* 10.4* 12.6 10.2*  HCT 32.6* 30.6* 29.3* 30.6* 37.3 30.2*  MCV 92.1 95.9 95.4 93.0 94.7 94.7  PLT 289 269 289 321 422* 818   Basic Metabolic Panel: Recent Labs  Lab 10/14/20 0441 10/15/20 0503 10/16/20 0519 10/17/20 0531 10/18/20 0558  NA 135 136 135 132* 131*  K 4.4 3.8 3.7 4.2 3.5  CL 104 107 103 98 98  CO2 23 22 24 23 25   GLUCOSE 102* 99 98 127* 92  BUN 7* 6* <5* 8 8  CREATININE 0.70 0.66 0.70 0.77 0.81  CALCIUM 8.7* 8.8* 8.8* 9.1 8.6*  MG  --  1.5* 1.9 1.7 1.7  PHOS  --  2.9 3.6 5.0* 3.4   GFR: Estimated Creatinine Clearance: 56.3 mL/min (by C-G formula based on SCr of 0.81 mg/dL). Liver Function Tests: Recent Labs  Lab 10/14/20 0441 10/15/20 0503 10/16/20 0519 10/17/20 0531 10/18/20 0558  AST 224* 118* 71* 72* 45*  ALT 179* 124* 93* 95* 64*  ALKPHOS 256* 221* 205* 228* 163*  BILITOT 3.7* 1.7* 1.4* 1.0 1.0  PROT 5.2* 5.1* 5.4* 6.6 5.6*  ALBUMIN 2.6* 2.6* 2.7* 3.2* 2.6*   Recent Labs  Lab 10/13/20 0345 10/18/20 0558  LIPASE 23 23   No results for input(s): AMMONIA in the last 168 hours. Coagulation Profile: Recent Labs  Lab 10/13/20  0345   INR 1.6*   Cardiac Enzymes: No results for input(s): CKTOTAL, CKMB, CKMBINDEX, TROPONINI in the last 168 hours. BNP (last 3 results) No results for input(s): PROBNP in the last 8760 hours. HbA1C: No results for input(s): HGBA1C in the last 72 hours. CBG: No results for input(s): GLUCAP in the last 168 hours. Lipid Profile: No results for input(s): CHOL, HDL, LDLCALC, TRIG, CHOLHDL, LDLDIRECT in the last 72 hours. Thyroid Function Tests: Recent Labs    10/16/20 0519 10/17/20 0531  TSH 8.431*  --   FREET4  --  0.92   Anemia Panel: No results for input(s): VITAMINB12, FOLATE, FERRITIN, TIBC, IRON, RETICCTPCT in the last 72 hours. Sepsis Labs: Recent Labs  Lab 10/13/20 0345 10/13/20 0518  LATICACIDVEN 0.9 1.0    Recent Results (from the past 240 hour(s))  Blood Culture (routine x 2)     Status: None (Preliminary result)   Collection Time: 10/13/20  3:45 AM   Specimen: BLOOD  Result Value Ref Range Status   Specimen Description   Final    BLOOD LEFT ANTECUBITAL Performed at [facility] [address] [place], [place] [address]    Special Requests   Final    BOTTLES DRAWN AEROBIC AND ANAEROBIC Blood Culture adequate volume Performed at [facility] [address], [place], [place] [address]    Culture   Final    NO GROWTH 4 DAYS Performed at [facility], [place] [address], [place], [place] [address]    Report Status PENDING  Incomplete  Blood Culture (routine x 2)     Status: None (Preliminary result)   Collection Time: 10/13/20  3:45 AM   Specimen: BLOOD  Result Value Ref Range Status   Specimen Description   Final    BLOOD BLOOD LEFT FOREARM Performed at [facility] [address] [place], [place] [address]    Special Requests   Final    BOTTLES DRAWN AEROBIC AND ANAEROBIC Blood Culture adequate volume Performed at [facility] [address] [place], [place] [address]     Culture   Final    NO GROWTH 4 DAYS Performed at [facility], [place] [address] [place], [place] [address]    Report Status PENDING  Incomplete  SARS CORONAVIRUS 2 (TAT 6-24 HRS) Nasopharyngeal Nasopharyngeal Swab     Status: None   Collection Time: 10/13/20  8:00 AM   Specimen: Nasopharyngeal Swab  Result Value Ref Range Status   SARS Coronavirus 2 NEGATIVE NEGATIVE Final    Comment: (NOTE) SARS-CoV-2 target nucleic acids are NOT DETECTED.  The SARS-CoV-2 RNA is generally detectable in upper and lower respiratory specimens during the acute phase of infection. Negative results do not preclude SARS-CoV-2 infection, do not rule out co-infections with other pathogens, and should not be used as the sole basis for treatment or other patient management decisions. Negative results must be combined with clinical observations, patient history, and epidemiological information. The expected result is Negative.  Fact Sheet for Patients: SugarRoll.be  Fact Sheet for Healthcare Providers: https://www.woods-mathews.com/  This test is not yet approved or cleared by the [place] FDA and  has been authorized for detection and/or diagnosis of SARS-CoV-2 by FDA under an Emergency Use Authorization (EUA). This EUA will remain  in effect (meaning this test can be used) for the duration of the COVID-19 declaration under Se ction 564(b)(1) of the Act, 21 U.S.C. section 360bbb-3(b)(1), unless the authorization is  terminated or revoked sooner.  Performed at [facility], [place] [address], [place], [place] [address]   MRSA PCR Screening     Status: None   Collection Time: 10/15/20  6:01 AM   Specimen: Nasal Mucosa; Nasopharyngeal  Result Value Ref Range Status   MRSA by PCR NEGATIVE NEGATIVE Final    Comment:        The GeneXpert MRSA Assay (FDA approved for NASAL specimens only), is one component of a comprehensive MRSA  colonization surveillance program. It is not intended to diagnose MRSA infection nor to guide or monitor treatment for MRSA infections. Performed at [facility], [place] [address] [place], [place] [address]     RN Pressure Injury Documentation:     Estimated body mass index is 28.84 kg/m as calculated from the following:   Height as of this encounter: 5\' 4"  (1.626 m).   Weight as of this encounter: 76.2 kg.  Malnutrition Type:   Malnutrition Characteristics:   Nutrition Interventions:     Radiology Studies: DG Cholangiogram Operative  Result Date: 10/16/2020 CLINICAL DATA:  Attempted cholangiogram EXAM: INTRAOPERATIVE CHOLANGIOGRAM TECHNIQUE: Cholangiographic images from the C-arm fluoroscopic device were submitted for interpretation post-operatively. Please see the procedural report for the amount of contrast and the fluoroscopy time utilized. COMPARISON:  October 13, 2020 FINDINGS: Injection of contrast did not opacify the biliary tree. Only extraluminal contrast was visualized. The biliary tree cannot be adequately assessed on this exam. One cine clip was submitted for evaluation. The total fluoroscopy time was 18 seconds. The total dose was 5.04 mGy. IMPRESSION: Nondiagnostic cholangiogram. Electronically Signed   By: Constance Holster M.D.   On: 10/16/2020 14:26   Scheduled Meds: . acetaminophen  1,000 mg Oral TID  . feeding supplement  237 mL Oral BID BM  . levothyroxine  125 mcg Oral Once per day on Mon Tue Wed Thu Fri Sat  . levothyroxine  62.5 mcg Oral Q Sun  . lip balm  1 application Topical BID  . polycarbophil  625 mg Oral BID  . sodium chloride flush  3 mL Intravenous Q12H   Continuous Infusions: . sodium chloride    . albumin human    . methocarbamol (ROBAXIN) IV    . piperacillin-tazobactam (ZOSYN)  IV 3.375 g (10/18/20 0412)    LOS: 4 days   Kerney Elbe, DO [facility] PAGER is on [place]  If 7PM-7AM, please contact  night-coverage [email or web address]

## 2020-10-19 ENCOUNTER — Inpatient Hospital Stay (HOSPITAL_COMMUNITY): Payer: Medicare Other

## 2020-10-19 DIAGNOSIS — I10 Essential (primary) hypertension: Secondary | ICD-10-CM | POA: Diagnosis not present

## 2020-10-19 DIAGNOSIS — I421 Obstructive hypertrophic cardiomyopathy: Secondary | ICD-10-CM | POA: Diagnosis not present

## 2020-10-19 DIAGNOSIS — I4819 Other persistent atrial fibrillation: Secondary | ICD-10-CM | POA: Diagnosis not present

## 2020-10-19 DIAGNOSIS — R7401 Elevation of levels of liver transaminase levels: Secondary | ICD-10-CM | POA: Diagnosis not present

## 2020-10-19 LAB — COMPREHENSIVE METABOLIC PANEL
ALT: 50 U/L — ABNORMAL HIGH (ref 0–44)
AST: 33 U/L (ref 15–41)
Albumin: 2.4 g/dL — ABNORMAL LOW (ref 3.5–5.0)
Alkaline Phosphatase: 126 U/L (ref 38–126)
Anion gap: 6 (ref 5–15)
BUN: 6 mg/dL — ABNORMAL LOW (ref 8–23)
CO2: 25 mmol/L (ref 22–32)
Calcium: 8.6 mg/dL — ABNORMAL LOW (ref 8.9–10.3)
Chloride: 102 mmol/L (ref 98–111)
Creatinine, Ser: 0.52 mg/dL (ref 0.44–1.00)
GFR, Estimated: >60 mL/min (ref >60)
Glucose, Bld: 96 mg/dL (ref 70–99)
Potassium: 3.7 mmol/L (ref 3.5–5.1)
Sodium: 133 mmol/L — ABNORMAL LOW (ref 135–145)
Total Bilirubin: 1 mg/dL (ref 0.3–1.2)
Total Protein: 4.9 g/dL — ABNORMAL LOW (ref 6.5–8.1)

## 2020-10-19 LAB — PHOSPHORUS: Phosphorus: 3.4 mg/dL (ref 2.5–4.6)

## 2020-10-19 LAB — CBC WITH DIFFERENTIAL/PLATELET
Abs Immature Granulocytes: 0.02 10*3/uL (ref 0.00–0.07)
Basophils Absolute: 0.1 10*3/uL (ref 0.0–0.1)
Basophils Relative: 1 %
Eosinophils Absolute: 0.4 10*3/uL (ref 0.0–0.5)
Eosinophils Relative: 7 %
HCT: 30.6 % — ABNORMAL LOW (ref 36.0–46.0)
Hemoglobin: 10.5 g/dL — ABNORMAL LOW (ref 12.0–15.0)
Immature Granulocytes: 0 %
Lymphocytes Relative: 22 %
Lymphs Abs: 1.4 10*3/uL (ref 0.7–4.0)
MCH: 31.8 pg (ref 26.0–34.0)
MCHC: 34.3 g/dL (ref 30.0–36.0)
MCV: 92.7 fL (ref 80.0–100.0)
Monocytes Absolute: 0.5 10*3/uL (ref 0.1–1.0)
Monocytes Relative: 8 %
Neutro Abs: 4.1 10*3/uL (ref 1.7–7.7)
Neutrophils Relative %: 62 %
Platelets: 333 10*3/uL (ref 150–400)
RBC: 3.3 MIL/uL — ABNORMAL LOW (ref 3.87–5.11)
RDW: 13.3 % (ref 11.5–15.5)
WBC: 6.5 10*3/uL (ref 4.0–10.5)
nRBC: 0 % (ref 0.0–0.2)

## 2020-10-19 LAB — SURGICAL PATHOLOGY

## 2020-10-19 LAB — MAGNESIUM: Magnesium: 1.7 mg/dL (ref 1.7–2.4)

## 2020-10-19 MED ORDER — MAGNESIUM SULFATE 2 GM/50ML IV SOLN
2.0000 g | Freq: Once | INTRAVENOUS | Status: AC
  Administered 2020-10-19: 2 g via INTRAVENOUS
  Filled 2020-10-19: qty 50

## 2020-10-19 MED ORDER — TECHNETIUM TC 99M MEBROFENIN IV KIT
5.5000 | PACK | Freq: Once | INTRAVENOUS | Status: AC | PRN
  Administered 2020-10-19: 5.5 via INTRAVENOUS

## 2020-10-19 MED ORDER — HYDRALAZINE HCL 20 MG/ML IJ SOLN: 10.0000 mg | Freq: Four times a day (QID) | INTRAMUSCULAR | Status: DC | PRN

## 2020-10-19 NOTE — Progress Notes (Signed)
3 Days Post-Op  Subjective: Feels tire today, but otherwise pain is controlled.  Drain just emptied.  Mobilized around her wound yesterday.  ROS: See above, otherwise other systems negative  Objective: Vital signs in last 24 hours: Temp:  [97.5 F (36.4 C)-99.1 F (37.3 C)] 97.6 F (36.4 C) (03/14 0600) Pulse Rate:  [58-74] 58 (03/14 0600) Resp:  [16-18] 18 (03/14 0600) BP: (126-161)/(63-83) 145/83 (03/14 0600) SpO2:  [96 %-98 %] 96 % (03/14 0600) Last BM Date: 10/13/20  Intake/Output from previous day: 03/13 0701 - 03/14 0700 In: 1743 [P.O.:1640; I.V.:3; IV Piggyback:100] Out: 4580 [Urine:4300; Drains:280] Intake/Output this shift: Total I/O In: 240 [P.O.:240] Out: 40 [Drains:40]  PE: Abd: soft, appropriately tender, JP drain with copious bilious appearing output that foams up when shaken.  Drain with over 50cc of output and drain just emptied.   Lab Results:  Recent Labs    10/18/20 0558 10/19/20 0437  WBC 7.9 6.5  HGB 10.2* 10.5*  HCT 30.2* 30.6*  PLT 349 333   BMET Recent Labs    10/18/20 0558 10/19/20 0437  NA 131* 133*  K 3.5 3.7  CL 98 102  CO2 25 25  GLUCOSE 92 96  BUN 8 6*  CREATININE 0.81 0.52  CALCIUM 8.6* 8.6*   PT/INR No results for input(s): LABPROT, INR in the last 72 hours. CMP     Component Value Date/Time   NA 133 (L) 10/19/2020 0437   NA 137 05/19/2020 1455   NA 137 02/10/2017 0954   K 3.7 10/19/2020 0437   K 3.9 02/10/2017 0954   CL 102 10/19/2020 0437   CO2 25 10/19/2020 0437   CO2 23 02/10/2017 0954   GLUCOSE 96 10/19/2020 0437   GLUCOSE 87 02/10/2017 0954   BUN 6 (L) 10/19/2020 0437   BUN 11 05/19/2020 1455   BUN 14.8 02/10/2017 0954   CREATININE 0.52 10/19/2020 0437   CREATININE 1.0 02/10/2017 0954   CALCIUM 8.6 (L) 10/19/2020 0437   CALCIUM 10.1 02/10/2017 0954   PROT 4.9 (L) 10/19/2020 0437   PROT 7.0 02/10/2017 0954   PROT 6.4 02/10/2017 0953   ALBUMIN 2.4 (L) 10/19/2020 0437   ALBUMIN 3.7 02/10/2017 0954    AST 33 10/19/2020 0437   AST 21 02/10/2017 0954   ALT 50 (H) 10/19/2020 0437   ALT 14 02/10/2017 0954   ALKPHOS 126 10/19/2020 0437   ALKPHOS 115 02/10/2017 0954   BILITOT 1.0 10/19/2020 0437   BILITOT 0.66 02/10/2017 0954   GFRNONAA >60 10/19/2020 0437   GFRAA 61 05/19/2020 1455   Lipase     Component Value Date/Time   LIPASE 23 10/18/2020 0558       Studies/Results: No results found.  Anti-infectives: Anti-infectives (From admission, onward)   Start     Dose/Rate Route Frequency Ordered Stop   10/17/20 1200  piperacillin-tazobactam (ZOSYN) IVPB 3.375 g        3.375 g 12.5 mL/hr over 240 Minutes Intravenous Every 8 hours 10/17/20 1041 10/22/20 1159   10/17/20 1000  cefTRIAXone (ROCEPHIN) 2 g in sodium chloride 0.9 % 100 mL IVPB  Status:  Discontinued        2 g 200 mL/hr over 30 Minutes Intravenous Every 24 hours 10/16/20 1639 10/17/20 1041   10/15/20 1200  cefTRIAXone (ROCEPHIN) 2 g in sodium chloride 0.9 % 100 mL IVPB  Status:  Discontinued        2 g 200 mL/hr over 30 Minutes Intravenous Every 24 hours  10/15/20 0948 10/16/20 1639       Assessment/Plan Atrial fibrillation on chronic anticoagulation last dose Eliquis p.m. 10/12/2020 Permanent transvenous pacemaker/almost 100% pacing Hypertrophic cardiomyopathy/moderate MI Cardiology consult 09/06/2020: Low to moderate risk, minimize time off anticoagulation Hypertension Hypothyroid Monoclonal gammopathy of unknown significance Hypokalemia Elevated LFTs - resolved Decreased appetite/weight loss-16 pounds since 09/05/2020  POD 3, s/p lap chole by Dr. Hassell Done 3/11, for cholecystitis, s/p perc chole drain placement and dislodgement without being able to be replaced -patient appears to have a bile leak today -will order a HIDA scan to confirm, but will also call GI so they can be on board for ERCP -cont ABX therapy -cont diet today as she doesn't need to be NPO for HIDA and has already eaten today and unlikely to  get ERCP today. -pulm toilet, mobilize,IS  FEN: HH diet/IV fluids ID: None DVT: Eliquis last dose p.m. 10/12/2020/SCD, ok with lovenox from surgery standpoint    LOS: 5 days    Henreitta Cea , [facility] Surgery 10/19/2020, 9:41 AM Please see Amion for pager number during day hours 7:00am-4:30pm or 7:00am -11:30am on weekends

## 2020-10-19 NOTE — Progress Notes (Signed)
PROGRESS NOTE    Tracey Mcclure  JOA:[id number] DOB: 11-18-1940 DOA: 10/13/2020 PCP: Crist Infante, MD   Brief Narrative:  The patient is a a 80 year old overweight Caucasian female with a past medical history significant for but not limited to permanent atrial fibrillation and sick sinus syndrome with a pacemaker in place, hokum, hypertension, MGUS, hypothyroidism, as well as other comorbidities who had a recent cholecystitis and presented back to the hospital with abdominal pain, nausea and malaise.  She had been admitted 6 weeks ago with cholecystitis and treated with antibiotics and had a PERC drain in place.  She is doing well and was active and eating a normal diet until the day before admission.  She went to the IR clinic for drain evaluation and cholangiogram but it obtained and is determined that the existing tube was malpositioned but in trying to reposition it access was lost and attempt to replace the tube failed.  The few hours of the procedure patient was sent home and started to have epigastric pain, nausea and subjective fevers.  This persisted and was so severe that she came to the ER for further evaluation.  In the ER she was noted to be afebrile with a normal white blood cell count and had a CT of the abdomen pelvis done which showed no fluid collection and had no findings of chronic cholecystitis.  She did have abnormal LFTs so both general surgery and gastroenterology were both consulted for further evaluation recommendations.  Gastroenterology feels that her it would be difficult to get a MRCP as the patient has a pacemaker and they are recommending proceeding with a cholecystectomy with an [place] currently her Eliquis is on hold.  She was on a clear liquid diet for now and made n.p.o. after midnight and general surgery planning on taking the patient for surgical intervention likely on Friday, 10/16/2020.  GI recommends following up with the [facility] and does not recommend up preoperative ERCP or  EUS at this time and they have signed off the case now.  She was started on IV Ceftriaxone on 10/15/20 preoperatively by general surgery and underwent surgical intervention on 10/16/20.  LFTs and bilirubin has continued to improve now and LFTs have stablized.  GI feels that she may have passed a common duct stone or have one there is more ball valving.   She is status post laparoscopic cholecystectomy with attempted intraoperative cholangiogram and now has a Blake drain in place he also had laparoscopic lysis of adhesions and apparently had horrible tissues requiring to oversew the cystic duct.  General surgery is concerned that she is at high risk for cystic duct stump bile leak and recommending following drain output and following liver functions.  Per their recommendation they have a strong clinical suspicion that she has a bile leak so they are obtaining a HIDA scan and if it is positive there when I get GI to do an ERCP and biliary stent decompression and I have reached out to Dr. Paulita Fujita..  They are recommending clear liquid diet for now and IV Zosyn for 5 days and holding off on Anticoagulation currently given that she may need the ERCP.  Assessment & Plan:   Principal Problem:   Acute calculous cholecystitis s/p cholecystectomy with oversew cystic duct 10/16/2020 Active Problems:   Hypertrophic obstructive cardiomyopathy (HCC)   HTN (hypertension)   Hypothyroidism   Obesity   MGUS (monoclonal gammopathy of unknown significance)   Atrial fibrillation ([place])   Pacemaker  Hypokalemia   Hyponatremia   Transaminitis   Choledocholithiasis   Memory difficulty   Chronic anticoagulation  Abnormal LFT's/Transaminitis improving Hyperbilirubinemia, improving -No CBD dilation on CT.  Other diagnostic considerations include Augmentin induced cholestasis.   -Of note, the stone was described as >3cm on imaging last hospitalization. -Continue to Hold statin -Trend LFTs and AST went from 403 -> 33  today and ALT went from 150 -> 179 -> 50 and T Bili  went from 2.6 -> 3.7 -> 1.7 -> 1.4 and is now 1.0 -GI and General Surgery Consulted -Consulted GI re: MRCP vs ERCP; if MRCP needed, would need transfer [facility] for MR compatible pacer monitoring and I spoke with Dr. Cristina Gong personally who feels that she does not need an MRCP given her pacer and does not feel that she needs a perioperative ERCP or EUS at this time and recommend proceeding with a surgery and intraoperative cholangiogram at that is positive then at that time they were doing ERCP -She underwent a laparoscopic cholecystectomy with attempted intraoperative cholangiogram on 10/16/2020 but general surgery aws very concerned that she is at a very high risk for delayed postoperative bile leak given that she had horrible tissues requiring oversew of the cystic duct by Dr. Hassell Done.  We will Continue to follow LFTs and follow the drain output present she is at very high risk for cystic duct stump leak -Since drainage was felt to be biliousand concerning they are getting a HIDA scan to evaluate leak and may need an ERCP with stenting; Yesterday General Surgery felt output is more Serosanguinous and recommends close monitoring however today they feel like she is having a Bile Leak; -Will hold off of Resumption of Anticoagulation this AM  -General surgery recommending continue IV antibiotics with Zosyn for 5 days (Day 3/5) and likley to be changed to po   -Now on a CLD; Diet advancement per GI and General Surgeyr  -Have Notified GI Dr. Paulita Fujita about evaluation for ERCP   Acute on chronic cholecystitis status post laparoscopic lysis of adhesions with cholecystectomy postoperative day 3 -PERC Chole drain had been removed but now has [employer] after surgery  -Monitor WBC and fever curve; she is afebrile and has no leukocytosis as her WBC is 7.9 -Initially was going to consult IR to replace drain (last dose Eliquis 3/7 in PM) however she did not undergo  a cholecystectomy on Friday so we do not feel the need to reconsult IR -Follow blood cultures x2; they showed no growth to date at 5days -Further care per general surgery and her Diet was advanced to a Heart Healthy Diet but now back on Clears  -General Surgery started the patient on IV antibiotics with IV ceftriaxone and he has been switched to IV Zosyn now -As above concern for Bile Leak so will be obtaining HIDA to evaluate and obtaining GI Evaluation for ERCP and stenting  -General Surgery Recommendations as above  -C/w Pain Control with Acetaminophen 1000 mg po TID, Judicious use of Oxycodone IR 2.5-5 mg po q4hprn given Confusion, and Fentanyl 25-50 mcg IV q1hprn Severe Pain and also Methocarbamol 1000 mg po q6hprn Muscle Spasms -Will need PT/OT to evaluate and Treat  Hyponatremia -Asymptomatic. -Sodium is now 133 -IV fluid hydration has now stopped  -Continue to monitor and trend and repeat CMP in a.m.  Hypokalemia -Improved as patient's potassium level of 3.0 is now 3.7 today -Continue to monitor and replete as necessary -IV fluid hydration is now stopped -Repeat CMP  in a.m. -Mag level was 1.7 this morning  Hypomagnesemia -Patient's magnesium level was 1.7 -Replete with IV mag sulfate 2 g again  -Continue to monitor and replete as necessary -Repeat magnesium level in a.m.  Hypothyroidism -Continue Levothyroxine with 125 mcg p.o. Monday Tuesday Wednesday Thursday Friday Saturday and 62.5 mcg p.o. every Sunday -Check TSH and was 8.431 -We will check a free T4 and was 0.92 -We will not make any adjustments given that she is hospitalized but will need to have her repeat her thyroid function tests in about 4 to 6 weeks  Permanent Atrial Fibrillation -CHA2DS2-Vasc 4.  Given that low risk profile, bridging anticoagulation will more likely raise her risk of perioperative bleeding, and is unlikely to prevent a thromboembolic event (Douketsis Charolette Child 2015, Ayoub J Stroke  Cerebrovasc Dis 2016). -Her rates are controlled -Continue to Hold apixaban for now per general surgery recommendations given high risk for bile leak -Possible Resumption on 10/19/20 per General Surgery Reccs but will hold off given concern blood  Essential Hypertension -Continue extremely low-dose hydrochlorothiazide 12.5 mg p.o. daily -IV fluid hydration is now stopped -Continue to monitor blood pressures per protocol -Last blood pressure reading was 145/83  Thrombocytosis -Patient's platelet count is slowly trending up and went from 269 -> 289 -> 321-> 422 -> 349 -> 333  -Likely from Above -Continue to Monitor and Trend -Repeat CBC in the AM   MGUS -Currently Inactive and Clinically stable  HOCM -Avoid fluid shifts perioperatively to the extent able -Continuing to Hold HCTZ for now and resume in the AM   DVT prophylaxis: SCDs Code Status: DO NOT RESUSCITATE  Family Communication: Discussed with Daughter at bedside  Disposition Plan: Pending further clinical improvement and clearance by General Surgery and Gastroenterology   Status is: Inpatient  Remains inpatient appropriate because:Unsafe d/c plan, IV treatments appropriate due to intensity of illness or inability to take PO and Inpatient level of care appropriate due to severity of illness   Dispo: The patient is from: Home              Anticipated d/c is to: TBD              Patient currently is not medically stable to d/c.   Difficult to place patient No  Consultants:   Gastroenterology  General Surgery    Procedures: Laparoscopic cholecystectomy with intraoperative cholangiogram   Antimicrobials:  Anti-infectives (From admission, onward)   Start     Dose/Rate Route Frequency Ordered Stop   10/17/20 1200  piperacillin-tazobactam (ZOSYN) IVPB 3.375 g        3.375 g 12.5 mL/hr over 240 Minutes Intravenous Every 8 hours 10/17/20 1041 10/22/20 1159   10/17/20 1000  cefTRIAXone (ROCEPHIN) 2 g in sodium  chloride 0.9 % 100 mL IVPB  Status:  Discontinued        2 g 200 mL/hr over 30 Minutes Intravenous Every 24 hours 10/16/20 1639 10/17/20 1041   10/15/20 1200  cefTRIAXone (ROCEPHIN) 2 g in sodium chloride 0.9 % 100 mL IVPB  Status:  Discontinued        2 g 200 mL/hr over 30 Minutes Intravenous Every 24 hours 10/15/20 0948 10/16/20 1639        Subjective: Seen and examined at bedside and her mentation was much better.  Still has some fullness around her drain site.  Also felt as if she had some more gas today.  No lightheadedness or dizziness.  No other concerns or plans at this  time.  Objective: Vitals:   10/18/20 1346 10/18/20 2151 10/18/20 2202 10/19/20 0600  BP: 126/74 126/63 (!) 161/76 (!) 145/83  Pulse: 60 62 74 (!) 58  Resp: 17 16 18 18   Temp: (!) 97.5 F (36.4 C) 98.4 F (36.9 C) 99.1 F (37.3 C) 97.6 F (36.4 C)  TempSrc: Oral Oral Oral Oral  SpO2: 97% 96% 98% 96%  Weight:      Height:        Intake/Output Summary (Last 24 hours) at 10/19/2020 1152 Last data filed at 10/19/2020 9379 Gross per 24 hour  Intake 1733 ml  Output 4200 ml  Net -2467 ml   Filed Weights   10/13/20 0346  Weight: 76.2 kg   Examination: Physical Exam:  Constitutional: WN/WD overweight Caucasian female currently in no acute distress appears calm and not as confused but still complains of some abdominal soreness Eyes: Lids and conjunctivae normal, sclerae anicteric  ENMT: External Ears, Nose appear normal. Grossly normal hearing.  Neck: Appears normal, supple, no cervical masses, normal ROM, no appreciable thyromegaly; no JVD Respiratory: Diminished to auscultation bilaterally, no wheezing, rales, rhonchi or crackles. Normal respiratory effort and patient is not tachypenic. No accessory muscle use.  Unlabored breathing Cardiovascular: Irregularly irregular, no murmurs / rubs / gallops. S1 and S2 auscultated.  S1 plus lower from edema Abdomen: Soft, slightly-tender to palpate, non-distended.   Has a drain in place and contents were just emptied by nursing staff.  Bowel sounds positive.  GU: Deferred. Musculoskeletal: No clubbing / cyanosis of digits/nails. No joint deformity upper and lower extremities.  Skin: No rashes, lesions, ulcers on limited skin evaluation. No induration; Warm and dry.  Neurologic: CN 2-12 grossly intact with no focal deficits. Romberg sign and cerebellar reflexes not assessed.  Psychiatric: Normal judgment and insight. Alert and oriented x 3 and not confused today. Normal mood and appropriate affect.   Data Reviewed: I have personally reviewed following labs and imaging studies  CBC: Recent Labs  Lab 10/15/20 0503 10/16/20 0519 10/17/20 0531 10/18/20 0558 10/19/20 0437  WBC 5.5 5.5 9.5 7.9 6.5  NEUTROABS 3.6 3.2 8.0* 5.1 4.1  HGB 9.8* 10.4* 12.6 10.2* 10.5*  HCT 29.3* 30.6* 37.3 30.2* 30.6*  MCV 95.4 93.0 94.7 94.7 92.7  PLT 289 321 422* 349 024   Basic Metabolic Panel: Recent Labs  Lab 10/15/20 0503 10/16/20 0519 10/17/20 0531 10/18/20 0558 10/19/20 0437  NA 136 135 132* 131* 133*  K 3.8 3.7 4.2 3.5 3.7  CL 107 103 98 98 102  CO2 22 24 23 25 25   GLUCOSE 99 98 127* 92 96  BUN 6* <5* 8 8 6*  CREATININE 0.66 0.70 0.77 0.81 0.52  CALCIUM 8.8* 8.8* 9.1 8.6* 8.6*  MG 1.5* 1.9 1.7 1.7 1.7  PHOS 2.9 3.6 5.0* 3.4 3.4   GFR: Estimated Creatinine Clearance: 57 mL/min (by C-G formula based on SCr of 0.52 mg/dL). Liver Function Tests: Recent Labs  Lab 10/15/20 0503 10/16/20 0519 10/17/20 0531 10/18/20 0558 10/19/20 0437  AST 118* 71* 72* 45* 33  ALT 124* 93* 95* 64* 50*  ALKPHOS 221* 205* 228* 163* 126  BILITOT 1.7* 1.4* 1.0 1.0 1.0  PROT 5.1* 5.4* 6.6 5.6* 4.9*  ALBUMIN 2.6* 2.7* 3.2* 2.6* 2.4*   Recent Labs  Lab 10/13/20 0345 10/18/20 0558  LIPASE 23 23   No results for input(s): AMMONIA in the last 168 hours. Coagulation Profile: Recent Labs  Lab 10/13/20 0345  INR 1.6*   Cardiac Enzymes:  No results for input(s):  CKTOTAL, CKMB, CKMBINDEX, TROPONINI in the last 168 hours. BNP (last 3 results) No results for input(s): PROBNP in the last 8760 hours. HbA1C: No results for input(s): HGBA1C in the last 72 hours. CBG: No results for input(s): GLUCAP in the last 168 hours. Lipid Profile: No results for input(s): CHOL, HDL, LDLCALC, TRIG, CHOLHDL, LDLDIRECT in the last 72 hours. Thyroid Function Tests: Recent Labs    10/17/20 0531  FREET4 0.92   Anemia Panel: No results for input(s): VITAMINB12, FOLATE, FERRITIN, TIBC, IRON, RETICCTPCT in the last 72 hours. Sepsis Labs: Recent Labs  Lab 10/13/20 0345 10/13/20 0518  LATICACIDVEN 0.9 1.0    Recent Results (from the past 240 hour(s))  Blood Culture (routine x 2)     Status: None   Collection Time: 10/13/20  3:45 AM   Specimen: BLOOD  Result Value Ref Range Status   Specimen Description   Final    BLOOD LEFT ANTECUBITAL Performed at [facility] [address], [place], [place] [address]    Special Requests   Final    BOTTLES DRAWN AEROBIC AND ANAEROBIC Blood Culture adequate volume Performed at [facility] [address], [place], [place] [address]    Culture   Final    NO GROWTH 5 DAYS Performed at [facility], [place] [address], [place], [place] [address]    Report Status 10/18/2020 FINAL  Final  Blood Culture (routine x 2)     Status: None   Collection Time: 10/13/20  3:45 AM   Specimen: BLOOD  Result Value Ref Range Status   Specimen Description   Final    BLOOD BLOOD LEFT FOREARM Performed at [facility] [address] [place], [place] [address]    Special Requests   Final    BOTTLES DRAWN AEROBIC AND ANAEROBIC Blood Culture adequate volume Performed at [facility] [address] [place], [place] [address]    Culture   Final    NO GROWTH 5 DAYS Performed at [facility], [place] [address], [place], [place] [address]    Report Status  10/18/2020 FINAL  Final  SARS CORONAVIRUS 2 (TAT 6-24 HRS) Nasopharyngeal Nasopharyngeal Swab     Status: None   Collection Time: 10/13/20  8:00 AM   Specimen: Nasopharyngeal Swab  Result Value Ref Range Status   SARS Coronavirus 2 NEGATIVE NEGATIVE Final    Comment: (NOTE) SARS-CoV-2 target nucleic acids are NOT DETECTED.  The SARS-CoV-2 RNA is generally detectable in upper and lower respiratory specimens during the acute phase of infection. Negative results do not preclude SARS-CoV-2 infection, do not rule out co-infections with other pathogens, and should not be used as the sole basis for treatment or other patient management decisions. Negative results must be combined with clinical observations, patient history, and epidemiological information. The expected result is Negative.  Fact Sheet for Patients: SugarRoll.be  Fact Sheet for Healthcare Providers: https://www.woods-mathews.com/  This test is not yet approved or cleared by the [place] FDA and  has been authorized for detection and/or diagnosis of SARS-CoV-2 by FDA under an Emergency Use Authorization (EUA). This EUA will remain  in effect (meaning this test can be used) for the duration of the COVID-19 declaration under Se ction 564(b)(1) of the Act, 21 U.S.C. section 360bbb-3(b)(1), unless the authorization is terminated or revoked sooner.  Performed at [facility], [place] [address], [place],  [address]   MRSA  PCR Screening     Status: None   Collection Time: 10/15/20  6:01 AM   Specimen: Nasal Mucosa; Nasopharyngeal  Result Value Ref Range Status   MRSA by PCR NEGATIVE NEGATIVE Final    Comment:        The GeneXpert MRSA Assay (FDA approved for NASAL specimens only), is one component of a comprehensive MRSA colonization surveillance program. It is not intended to diagnose MRSA infection nor to guide or monitor treatment for MRSA  infections. Performed at [facility], [place] [address] [place], [place] [address]     RN Pressure Injury Documentation:     Estimated body mass index is 28.84 kg/m as calculated from the following:   Height as of this encounter: 5\' 4"  (1.626 m).   Weight as of this encounter: 76.2 kg.  Malnutrition Type:   Malnutrition Characteristics:   Nutrition Interventions:     Radiology Studies: No results found. Scheduled Meds: . acetaminophen  1,000 mg Oral TID  . feeding supplement  237 mL Oral BID BM  . levothyroxine  125 mcg Oral Once per day on Mon Tue Wed Thu Fri Sat  . levothyroxine  62.5 mcg Oral Q Sun  . lip balm  1 application Topical BID  . polycarbophil  625 mg Oral BID  . sodium chloride flush  3 mL Intravenous Q12H   Continuous Infusions: . sodium chloride    . albumin human    . methocarbamol (ROBAXIN) IV    . piperacillin-tazobactam (ZOSYN)  IV 3.375 g (10/19/20 0419)    LOS: 5 days   Kerney Elbe, DO [facility] PAGER is on [place]  If 7PM-7AM, please contact night-coverage [email or web address]

## 2020-10-19 NOTE — Progress Notes (Signed)
Subjective: Soreness around JP drain site.  Objective: Vital signs in last 24 hours: Temp:  [97.5 F (36.4 C)-99.1 F (37.3 C)] 97.6 F (36.4 C) (03/14 0600) Pulse Rate:  [58-74] 58 (03/14 0600) Resp:  [16-18] 18 (03/14 0600) BP: (126-161)/(63-83) 145/83 (03/14 0600) SpO2:  [96 %-98 %] 96 % (03/14 0600) Weight change:  Last BM Date: 10/13/20  PE: GEN:  NAD ABD:  Mild abdominal soreness especially around JP tube site; fluid JP tube appears serosanguinous but may have slight bile tinge  Lab Results: CBC    Component Value Date/Time   WBC 6.5 10/19/2020 0437   RBC 3.30 (L) 10/19/2020 0437   HGB 10.5 (L) 10/19/2020 0437   HGB 13.8 02/10/2017 0953   HCT 30.6 (L) 10/19/2020 0437   HCT 40.1 02/10/2017 0953   PLT 333 10/19/2020 0437   PLT 266 02/10/2017 0953   MCV 92.7 10/19/2020 0437   MCV 94.1 02/10/2017 0953   MCH 31.8 10/19/2020 0437   MCHC 34.3 10/19/2020 0437   RDW 13.3 10/19/2020 0437   RDW 12.9 02/10/2017 0953   LYMPHSABS 1.4 10/19/2020 0437   LYMPHSABS 3.0 02/10/2017 0953   MONOABS 0.5 10/19/2020 0437   MONOABS 0.5 02/10/2017 0953   EOSABS 0.4 10/19/2020 0437   EOSABS 0.2 02/10/2017 0953   BASOSABS 0.1 10/19/2020 0437   BASOSABS 0.0 02/10/2017 0953   CMP     Component Value Date/Time   NA 133 (L) 10/19/2020 0437   NA 137 05/19/2020 1455   NA 137 02/10/2017 0954   K 3.7 10/19/2020 0437   K 3.9 02/10/2017 0954   CL 102 10/19/2020 0437   CO2 25 10/19/2020 0437   CO2 23 02/10/2017 0954   GLUCOSE 96 10/19/2020 0437   GLUCOSE 87 02/10/2017 0954   BUN 6 (L) 10/19/2020 0437   BUN 11 05/19/2020 1455   BUN 14.8 02/10/2017 0954   CREATININE 0.52 10/19/2020 0437   CREATININE 1.0 02/10/2017 0954   CALCIUM 8.6 (L) 10/19/2020 0437   CALCIUM 10.1 02/10/2017 0954   PROT 4.9 (L) 10/19/2020 0437   PROT 7.0 02/10/2017 0954   PROT 6.4 02/10/2017 0953   ALBUMIN 2.4 (L) 10/19/2020 0437   ALBUMIN 3.7 02/10/2017 0954   AST 33 10/19/2020 0437   AST 21 02/10/2017 0954    ALT 50 (H) 10/19/2020 0437   ALT 14 02/10/2017 0954   ALKPHOS 126 10/19/2020 0437   ALKPHOS 115 02/10/2017 0954   BILITOT 1.0 10/19/2020 0437   BILITOT 0.66 02/10/2017 0954   GFRNONAA >60 10/19/2020 0437   GFRAA 61 05/19/2020 1455   Assessment:  1.  Complicated cholecystectomy. 2.  JP fluid output, serosanguinous vs bile leak. 3.  Elevated LFTs, downtrending.  Plan:  1.  HIDA scan. 2.  Pending HIDA scan findings as well as biliary fluid total output and appearance, may consider ERCP in the next few days. 3.  Recommendations reviewed with patient and family at bedside.  Nature/risks of ERCP reviewed with patient and daughter in detail. 4.  [facility] will revisit tomorrow.   Landry Dyke 10/19/2020, 11:55 AM   Cell [phone] If no answer or after 5 PM call [phone]

## 2020-10-19 NOTE — Evaluation (Signed)
Physical Therapy Evaluation Patient Details Name: Tracey Mcclure MRN: [id number] DOB: 12-31-1940 Today's Date: 10/19/2020   History of Present Illness  80 yo female s/p cholecystectomy 10/16/20. Hx of Afib, SSS-pacemaker, memory deficits, obesity  Clinical Impression  On eval, pt was Min guard assist for mobility. She walked ~350 feet around the unit with a RW. Pt tolerated activity well. Encouraged her to continue mobilizing as able. Will plan to follow, especially since pt may have another procedure during this hospital stay. If pt continues to do well,, she likely will not need any PT f/u at discharge.     Follow Up Recommendations No PT follow up;Supervision for mobility/OOB (depending on progress)    Equipment Recommendations  None recommended by PT    Recommendations for Other Services       Precautions / Restrictions Precautions Precautions: Fall Restrictions Weight Bearing Restrictions: No      Mobility  Bed Mobility               General bed mobility comments: sitting EOB    Transfers Overall transfer level: Modified independent Equipment used: Rolling walker (2 wheeled) Transfers: Sit to/from Stand              Ambulation/Gait Ambulation/Gait assistance: Counsellor (Feet): 350 Feet Assistive device: Rolling walker (2 wheeled) Gait Pattern/deviations: Step-through pattern;Decreased stride length        Stairs            Wheelchair Mobility    Modified Rankin (Stroke Patients Only)       Balance Overall balance assessment: Mild deficits observed, not formally tested                                           Pertinent Vitals/Pain Pain Assessment: Faces Faces Pain Scale: Hurts a little bit Pain Location: abdomen Pain Intervention(s): Monitored during session    Home Living Family/patient expects to be discharged to:: Private residence Living Arrangements: Alone Available Help at Discharge:  Family;Available PRN/intermittently Type of Home: House Home Access: Stairs to enter     Home Layout: Multi-level Home Equipment: Gilford Rile - 2 wheels      Prior Function Level of Independence: Independent               Hand Dominance        Extremity/Trunk Assessment   Upper Extremity Assessment Upper Extremity Assessment: Overall WFL for tasks assessed    Lower Extremity Assessment Lower Extremity Assessment: Generalized weakness    Cervical / Trunk Assessment Cervical / Trunk Assessment: Normal  Communication   Communication: No difficulties  Cognition Arousal/Alertness: Awake/alert Behavior During Therapy: WFL for tasks assessed/performed Overall Cognitive Status: Within Functional Limits for tasks assessed                                        General Comments      Exercises     Assessment/Plan    PT Assessment Patient needs continued PT services  PT Problem List Decreased mobility       PT Treatment Interventions Gait training;Therapeutic activities;Therapeutic exercise;DME instruction;Patient/family education;Functional mobility training;Balance training    PT Goals (Current goals can be found in the Care Plan section)  Acute Rehab PT Goals Patient Stated Goal: none stated PT Goal Formulation: With  patient Time For Goal Achievement: 11/02/20 Potential to Achieve Goals: Good    Frequency Min 3X/week   Barriers to discharge        Co-evaluation               AM-PAC PT "6 Clicks" Mobility  Outcome Measure Help needed turning from your back to your side while in a flat bed without using bedrails?: None Help needed moving from lying on your back to sitting on the side of a flat bed without using bedrails?: None Help needed moving to and from a bed to a chair (including a wheelchair)?: None Help needed standing up from a chair using your arms (e.g., wheelchair or bedside chair)?: None Help needed to walk in hospital  room?: A Little Help needed climbing 3-5 steps with a railing? : A Little 6 Click Score: 22    End of Session   Activity Tolerance: Patient tolerated treatment well Patient left: in chair;with call bell/phone within reach;with family/visitor present   PT Visit Diagnosis: Unsteadiness on feet (R26.81)    Time: 3202-3343 PT Time Calculation (min) (ACUTE ONLY): 22 min   Charges:   PT Evaluation $PT Eval Low Complexity: 1 Low            Doreatha Massed, PT Acute Rehabilitation  Office: [phone] Pager: [phone]

## 2020-10-19 NOTE — Care Management Important Message (Signed)
Important Message  Patient Details IM Letter given to the Patient. Name: Tracey Mcclure MRN: [id number] Date of Birth: September 11, 1940   Medicare Important Message Given:  Yes     Kerin Salen 10/19/2020, 1:26 PM

## 2020-10-20 DIAGNOSIS — I10 Essential (primary) hypertension: Secondary | ICD-10-CM | POA: Diagnosis not present

## 2020-10-20 DIAGNOSIS — I421 Obstructive hypertrophic cardiomyopathy: Secondary | ICD-10-CM | POA: Diagnosis not present

## 2020-10-20 DIAGNOSIS — R7401 Elevation of levels of liver transaminase levels: Secondary | ICD-10-CM | POA: Diagnosis not present

## 2020-10-20 DIAGNOSIS — I4819 Other persistent atrial fibrillation: Secondary | ICD-10-CM | POA: Diagnosis not present

## 2020-10-20 LAB — COMPREHENSIVE METABOLIC PANEL WITH GFR
ALT: 43 U/L (ref 0–44)
AST: 28 U/L (ref 15–41)
Albumin: 2.8 g/dL — ABNORMAL LOW (ref 3.5–5.0)
Alkaline Phosphatase: 146 U/L — ABNORMAL HIGH (ref 38–126)
Anion gap: 10 (ref 5–15)
BUN: 6 mg/dL — ABNORMAL LOW (ref 8–23)
CO2: 24 mmol/L (ref 22–32)
Calcium: 8.7 mg/dL — ABNORMAL LOW (ref 8.9–10.3)
Chloride: 101 mmol/L (ref 98–111)
Creatinine, Ser: 0.75 mg/dL (ref 0.44–1.00)
GFR, Estimated: >60 mL/min
Glucose, Bld: 95 mg/dL (ref 70–99)
Potassium: 3.4 mmol/L — ABNORMAL LOW (ref 3.5–5.1)
Sodium: 135 mmol/L (ref 135–145)
Total Bilirubin: 0.9 mg/dL (ref 0.3–1.2)
Total Protein: 5.4 g/dL — ABNORMAL LOW (ref 6.5–8.1)

## 2020-10-20 LAB — CBC WITH DIFFERENTIAL/PLATELET
Abs Immature Granulocytes: 0.02 10*3/uL (ref 0.00–0.07)
Basophils Absolute: 0.1 10*3/uL (ref 0.0–0.1)
Basophils Relative: 1 %
Eosinophils Absolute: 0.5 10*3/uL (ref 0.0–0.5)
Eosinophils Relative: 8 %
HCT: 32.9 % — ABNORMAL LOW (ref 36.0–46.0)
Hemoglobin: 11.1 g/dL — ABNORMAL LOW (ref 12.0–15.0)
Immature Granulocytes: 0 %
Lymphocytes Relative: 29 %
Lymphs Abs: 1.8 10*3/uL (ref 0.7–4.0)
MCH: 31.9 pg (ref 26.0–34.0)
MCHC: 33.7 g/dL (ref 30.0–36.0)
MCV: 94.5 fL (ref 80.0–100.0)
Monocytes Absolute: 0.5 10*3/uL (ref 0.1–1.0)
Monocytes Relative: 8 %
Neutro Abs: 3.2 10*3/uL (ref 1.7–7.7)
Neutrophils Relative %: 54 %
Platelets: 368 10*3/uL (ref 150–400)
RBC: 3.48 MIL/uL — ABNORMAL LOW (ref 3.87–5.11)
RDW: 13.3 % (ref 11.5–15.5)
WBC: 6.1 10*3/uL (ref 4.0–10.5)
nRBC: 0 % (ref 0.0–0.2)

## 2020-10-20 LAB — PHOSPHORUS: Phosphorus: 3.5 mg/dL (ref 2.5–4.6)

## 2020-10-20 LAB — MAGNESIUM: Magnesium: 2 mg/dL (ref 1.7–2.4)

## 2020-10-20 MED ORDER — POTASSIUM CHLORIDE CRYS ER 20 MEQ PO TBCR
40.0000 meq | EXTENDED_RELEASE_TABLET | Freq: Two times a day (BID) | ORAL | Status: AC
  Administered 2020-10-20: 40 meq via ORAL
  Filled 2020-10-20 (×2): qty 2

## 2020-10-20 NOTE — Progress Notes (Signed)
4 Days Post-Op  Subjective: No new complaints today.  Up in chair with PT.  Having some short term memory issues, mostly with what day it is.   ROS: See above, otherwise other systems negative  Objective: Vital signs in last 24 hours: Temp:  [97.3 F (36.3 C)-98.2 F (36.8 C)] 98 F (36.7 C) (03/15 0605) Pulse Rate:  [59-71] 59 (03/15 0605) Resp:  [18] 18 (03/15 0605) BP: (133-183)/(60-78) 136/70 (03/15 0605) SpO2:  [96 %-100 %] 98 % (03/15 0605) Last BM Date: 10/16/20  Intake/Output from previous day: 03/14 0701 - 03/15 0700 In: 711.1 [P.O.:460; IV Piggyback:251.1] Out: 2570 [Urine:2200; Drains:370] Intake/Output this shift: No intake/output data recorded.  PE: Abd: soft, appropriately tender, JP drain with copious bilious appearing output that foams up when shaken.  Drain emptied of 100cc of bile.  Over 320cc documented in last 24hrs.  Incisions c/d/i   Lab Results:  Recent Labs    10/19/20 0437 10/20/20 0520  WBC 6.5 6.1  HGB 10.5* 11.1*  HCT 30.6* 32.9*  PLT 333 368   BMET Recent Labs    10/19/20 0437 10/20/20 0520  NA 133* 135  K 3.7 3.4*  CL 102 101  CO2 25 24  GLUCOSE 96 95  BUN 6* 6*  CREATININE 0.52 0.75  CALCIUM 8.6* 8.7*   PT/INR No results for input(s): LABPROT, INR in the last 72 hours. CMP     Component Value Date/Time   NA 135 10/20/2020 0520   NA 137 05/19/2020 1455   NA 137 02/10/2017 0954   K 3.4 (L) 10/20/2020 0520   K 3.9 02/10/2017 0954   CL 101 10/20/2020 0520   CO2 24 10/20/2020 0520   CO2 23 02/10/2017 0954   GLUCOSE 95 10/20/2020 0520   GLUCOSE 87 02/10/2017 0954   BUN 6 (L) 10/20/2020 0520   BUN 11 05/19/2020 1455   BUN 14.8 02/10/2017 0954   CREATININE 0.75 10/20/2020 0520   CREATININE 1.0 02/10/2017 0954   CALCIUM 8.7 (L) 10/20/2020 0520   CALCIUM 10.1 02/10/2017 0954   PROT 5.4 (L) 10/20/2020 0520   PROT 7.0 02/10/2017 0954   PROT 6.4 02/10/2017 0953   ALBUMIN 2.8 (L) 10/20/2020 0520   ALBUMIN 3.7  02/10/2017 0954   AST 28 10/20/2020 0520   AST 21 02/10/2017 0954   ALT 43 10/20/2020 0520   ALT 14 02/10/2017 0954   ALKPHOS 146 (H) 10/20/2020 0520   ALKPHOS 115 02/10/2017 0954   BILITOT 0.9 10/20/2020 0520   BILITOT 0.66 02/10/2017 0954   GFRNONAA >60 10/20/2020 0520   GFRAA 61 05/19/2020 1455   Lipase     Component Value Date/Time   LIPASE 23 10/18/2020 0558       Studies/Results: NM HEPATOBILIARY LEAK (POST-SURGICAL)  Result Date: 10/19/2020 CLINICAL DATA:  Laparoscopic cholecystectomy. Evaluate for biliary leak. Initial percutaneous cholecystostomy drain was placed. Patient has a JP drain following cholecystectomy. EXAM: NUCLEAR MEDICINE HEPATOBILIARY IMAGING TECHNIQUE: Sequential images of the abdomen were obtained out to 60 minutes following intravenous administration of radiopharmaceutical. RADIOPHARMACEUTICALS:  5.2 mCi Tc-72m  Choletec IV COMPARISON:  None. FINDINGS: Surgical drain clamped for imaging. Prompt clearance radiotracer from the blood pool and homogeneous uptake in the liver. Counts are evident in the common bile duct by 15 minutes. At 20 minutes, there is clear biliary excreted radiotracer along the inferior margin of the RIGHT hepatic lobe. This extra biliary collection increases in intensity up to 30 minutes extending along the RIGHT pericolic gutter.  IMPRESSION: Brisk bile leak with biliary excreted radiotracer collecting along the inferior margin RIGHT hepatic lobe extending into the RIGHT pericolic gutter. These results will be called to the ordering clinician or representative by the Radiologist Assistant, and communication documented in the PACS or [employer]. Electronically Signed   By: Suzy Bouchard M.D.   On: 10/19/2020 16:14    Anti-infectives: Anti-infectives (From admission, onward)   Start     Dose/Rate Route Frequency Ordered Stop   10/17/20 1200  piperacillin-tazobactam (ZOSYN) IVPB 3.375 g        3.375 g 12.5 mL/hr over 240 Minutes  Intravenous Every 8 hours 10/17/20 1041 10/22/20 1159   10/17/20 1000  cefTRIAXone (ROCEPHIN) 2 g in sodium chloride 0.9 % 100 mL IVPB  Status:  Discontinued        2 g 200 mL/hr over 30 Minutes Intravenous Every 24 hours 10/16/20 1639 10/17/20 1041   10/15/20 1200  cefTRIAXone (ROCEPHIN) 2 g in sodium chloride 0.9 % 100 mL IVPB  Status:  Discontinued        2 g 200 mL/hr over 30 Minutes Intravenous Every 24 hours 10/15/20 0948 10/16/20 1639       Assessment/Plan Atrial fibrillation on chronic anticoagulation last dose Eliquis p.m. 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 Hassell Done 3/11, for cholecystitis, s/p perc chole drain placement and dislodgement without being able to be replaced -+ bile leak no unexpected -await ERCP per GI -cont ABX therapy x5 days post op -pulm toilet, mobilize,IS  FEN: NPO for procedure/IV fluids ID: None DVT: Eliquis last dose p.m. 10/12/2020/SCD, ok with lovenox from surgery standpoint    LOS: 6 days    Tracey Mcclure , [facility] Surgery 10/20/2020, 9:10 AM Please see Amion for pager number during day hours 7:00am-4:30pm or 7:00am -11:30am on weekends

## 2020-10-20 NOTE — Progress Notes (Signed)
Subjective: Soreness around JP drain site. No abdominal pain.  Objective: Vital signs in last 24 hours: Temp:  [97.3 F (36.3 C)-98.2 F (36.8 C)] 98 F (36.7 C) (03/15 0605) Pulse Rate:  [59-71] 59 (03/15 0605) Resp:  [18] 18 (03/15 0605) BP: (133-183)/(60-78) 136/70 (03/15 0605) SpO2:  [96 %-100 %] 98 % (03/15 0605) Weight change:  Last BM Date: 10/16/20  PE: GEN:  NAD ABD:  Soft, mild JP drain tenderness; JP drain with frank bile  Lab Results: CBC    Component Value Date/Time   WBC 6.1 10/20/2020 0520   RBC 3.48 (L) 10/20/2020 0520   HGB 11.1 (L) 10/20/2020 0520   HGB 13.8 02/10/2017 0953   HCT 32.9 (L) 10/20/2020 0520   HCT 40.1 02/10/2017 0953   PLT 368 10/20/2020 0520   PLT 266 02/10/2017 0953   MCV 94.5 10/20/2020 0520   MCV 94.1 02/10/2017 0953   MCH 31.9 10/20/2020 0520   MCHC 33.7 10/20/2020 0520   RDW 13.3 10/20/2020 0520   RDW 12.9 02/10/2017 0953   LYMPHSABS 1.8 10/20/2020 0520   LYMPHSABS 3.0 02/10/2017 0953   MONOABS 0.5 10/20/2020 0520   MONOABS 0.5 02/10/2017 0953   EOSABS 0.5 10/20/2020 0520   EOSABS 0.2 02/10/2017 0953   BASOSABS 0.1 10/20/2020 0520   BASOSABS 0.0 02/10/2017 0953   CMP     Component Value Date/Time   NA 135 10/20/2020 0520   NA 137 05/19/2020 1455   NA 137 02/10/2017 0954   K 3.4 (L) 10/20/2020 0520   K 3.9 02/10/2017 0954   CL 101 10/20/2020 0520   CO2 24 10/20/2020 0520   CO2 23 02/10/2017 0954   GLUCOSE 95 10/20/2020 0520   GLUCOSE 87 02/10/2017 0954   BUN 6 (L) 10/20/2020 0520   BUN 11 05/19/2020 1455   BUN 14.8 02/10/2017 0954   CREATININE 0.75 10/20/2020 0520   CREATININE 1.0 02/10/2017 0954   CALCIUM 8.7 (L) 10/20/2020 0520   CALCIUM 10.1 02/10/2017 0954   PROT 5.4 (L) 10/20/2020 0520   PROT 7.0 02/10/2017 0954   PROT 6.4 02/10/2017 0953   ALBUMIN 2.8 (L) 10/20/2020 0520   ALBUMIN 3.7 02/10/2017 0954   AST 28 10/20/2020 0520   AST 21 02/10/2017 0954   ALT 43 10/20/2020 0520   ALT 14 02/10/2017 0954    ALKPHOS 146 (H) 10/20/2020 0520   ALKPHOS 115 02/10/2017 0954   BILITOT 0.9 10/20/2020 0520   BILITOT 0.66 02/10/2017 0954   GFRNONAA >60 10/20/2020 0520   GFRAA 61 05/19/2020 1455   Studies/Results: HIDA + bile leak  Assessment:  1.  Bile leak. 2.  Elevated LFTs, downtrending  Plan:  1.  ERCP tomorrow (no anesthesia room on schedule today); discussed this with patient/daughter. 2.  Risks (up to and including bleeding, infection, perforation, pancreatitis that can be complicated by infected necrosis and death), benefits (removal of stones, alleviating blockage, decreasing risk of cholangitis or choledocholithiasis-related pancreatitis), and alternatives (watchful waiting, percutaneous transhepatic cholangiography) of ERCP were explained to patient/family in detail and patient elects to proceed. 3.  OK to have clear liquids, NPO after midnight.   Landry Dyke 10/20/2020, 9:13 AM   Cell [phone] If no answer or after 5 PM call [phone]

## 2020-10-20 NOTE — Evaluation (Signed)
Occupational Therapy Evaluation Patient Details Name: Tracey Mcclure MRN: [id number] DOB: 21-Oct-1940 Today's Date: 10/20/2020    History of Present Illness 80 yo female s/p cholecystectomy 10/16/20. Possible ERCP 3/15 or 3/16.  Hx of Afib, SSS-pacemaker, memory deficits, obesity   Clinical Impression   Patient evaluated by Occupational Therapy with no further acute OT needs identified. All education has been completed and the patient has no further questions.  See below for any follow-up Occupational Therapy or equipment needs. OT is signing off. Thank you for this referral.     Follow Up Recommendations  No OT follow up    Equipment Recommendations       Recommendations for Other Services       Precautions / Restrictions Precautions Precautions: Fall Precaution Comments: NPO for possible ERCP 3/15 Restrictions Weight Bearing Restrictions: No      Mobility Bed Mobility               General bed mobility comments: Pt in recliner.    Transfers Overall transfer level: Modified independent Equipment used: None (Pt pushing her IV pole.) Transfers: Sit to/from Stand Sit to Stand: Modified independent (Device/Increase time)         General transfer comment: During ambulation in room ~75' in room, pt holding IV pole and noted one incidence of pt steadying herself on bed with other hand, but no observed loss of balance.    Balance Overall balance assessment: Mild deficits observed, not formally tested                                         ADL either performed or assessed with clinical judgement   ADL Overall ADL's : At baseline                                       General ADL Comments: Pt able to demonstrate LE dressing using modified figure 4 position for pain management, toilet transfers, toileting hygiene (simulated), standing at sink for grooming/hand hygiene and completed education on AE/DME recommendations and energy  conservastion while recovering at home.     Vision Patient Visual Report: No change from baseline Vision Assessment?: No apparent visual deficits     Perception     Praxis      Pertinent Vitals/Pain Pain Assessment: 0-10 Pain Score: 3  Pain Location: abdomen RT and LT sides. Pain Intervention(s): Limited activity within patient's tolerance;Monitored during session;Repositioned;Other (comment) (Pt educated on compensatory ADL strategies to manage pain during self care.)     Hand Dominance     Extremity/Trunk Assessment Upper Extremity Assessment Upper Extremity Assessment: Overall WFL for tasks assessed   Lower Extremity Assessment Lower Extremity Assessment: Defer to PT evaluation   Cervical / Trunk Assessment Cervical / Trunk Assessment: Normal   Communication Communication Communication: No difficulties   Cognition Arousal/Alertness: Awake/alert Behavior During Therapy: WFL for tasks assessed/performed Overall Cognitive Status: Within Functional Limits for tasks assessed                                     General Comments       Exercises     Shoulder Instructions      Home Living Family/patient expects to be discharged to:: Private  residence Living Arrangements: Alone Available Help at Discharge: Family;Available PRN/intermittently (2 daughters nearby) Type of Home: House Home Access: Stairs to enter [employer] of Steps: 2 Entrance Stairs-Rails: None Home Layout: Multi-level;Full bath on main level;Able to live on main level with bedroom/bathroom     Bathroom Shower/Tub: Occupational psychologist: Standard     Home Equipment: Environmental consultant - 2 wheels;Adaptive equipment Adaptive Equipment: Reacher Additional Comments: Pt reports that she may have a shower chair.      Prior Functioning/Environment Level of Independence: Independent                 OT Problem List: Pain      OT Treatment/Interventions:       OT Goals(Current goals can be found in the care plan section) Acute Rehab OT Goals Patient Stated Goal: Become well enough to be able to travel to her hometown of [place], [place] and visit friends. OT Goal Formulation: With patient/family Potential to Achieve Goals: Good  OT Frequency:     Barriers to D/C:            Co-evaluation              AM-PAC OT "6 Clicks" Daily Activity     Outcome Measure Help from another person eating meals?: None (NPO, but shows ability to feed self when cleared.) Help from another person taking care of personal grooming?: None Help from another person toileting, which includes using toliet, bedpan, or urinal?: None Help from another person bathing (including washing, rinsing, drying)?: None Help from another person to put on and taking off regular upper body clothing?: None Help from another person to put on and taking off regular lower body clothing?: None 6 Click Score: 24   End of Session Equipment Utilized During Treatment:  (IV pole)  Activity Tolerance: Patient tolerated treatment well Patient left: in chair;with family/visitor present;with call bell/phone within reach (Surgeon in room)  OT Visit Diagnosis: Pain Pain - Right/Left:  (Bilateral) Pain - part of body:  (abdomen)                Time: 8343-7357 OT Time Calculation (min): 22 min Charges:  OT General Charges $OT Visit: 1 Visit OT Evaluation $OT Eval Low Complexity: 1 Low  Nakeita Styles, [place] Office: [phone] 10/20/2020  Julien Girt 10/20/2020, 8:56 AM

## 2020-10-20 NOTE — Progress Notes (Signed)
PROGRESS NOTE    Tracey Mcclure  WER:[id number] DOB: April 11, 1941 DOA: 10/13/2020 PCP: Crist Infante, MD   Brief Narrative:  The patient is a a 80 year old overweight Caucasian female with a past medical history significant for but not limited to permanent atrial fibrillation and sick sinus syndrome with a pacemaker in place, hokum, hypertension, MGUS, hypothyroidism, as well as other comorbidities who had a recent cholecystitis and presented back to the hospital with abdominal pain, nausea and malaise.  She had been admitted 6 weeks ago with cholecystitis and treated with antibiotics and had a PERC drain in place.  She is doing well and was active and eating a normal diet until the day before admission.  She went to the IR clinic for drain evaluation and cholangiogram but it obtained and is determined that the existing tube was malpositioned but in trying to reposition it access was lost and attempt to replace the tube failed.  The few hours of the procedure patient was sent home and started to have epigastric pain, nausea and subjective fevers.  This persisted and was so severe that she came to the ER for further evaluation.  In the ER she was noted to be afebrile with a normal white blood cell count and had a CT of the abdomen pelvis done which showed no fluid collection and had no findings of chronic cholecystitis.  She did have abnormal LFTs so both general surgery and gastroenterology were both consulted for further evaluation recommendations.  Gastroenterology feels that her it would be difficult to get a MRCP as the patient has a pacemaker and they are recommending proceeding with a cholecystectomy with an [place] currently her Eliquis is on hold.    She was started on IV Ceftriaxone on 10/15/20 preoperatively by general surgery and underwent surgical intervention on 10/16/20 and now changed to IV Zosyn.  LFTs and bilirubin has continued to improve now and LFTs have stablized.  GI feels that she may have  passed a common duct stone or have one there is more ball valving.   She is status post laparoscopic cholecystectomy with attempted intraoperative cholangiogram and now has a Blake drain in place he also had laparoscopic lysis of adhesions and apparently had horrible tissues requiring to oversew the cystic duct.  General surgery is concerned that she is at high risk for cystic duct stump bile leak and recommending following drain output and following liver functions.  Per their recommendation they have a strong clinical suspicion that she has a bile leak so they are obtaining a HIDA scan and it showed a brisk bile leak with biliary excreted radiotracer crusting along the inferior margin of the right hepatic lobe extending to the right pericolic gutter. GI to do an ERCP and biliary stent decompression and I have reached out to Dr. Paulita Fujita.  They are recommending clear liquid diet for now and IV Zosyn for 5 days and holding off on Anticoagulation.  She is will go for ERCP tomorrow given that there is no room on the OR schedule today  Assessment & Plan:   Principal Problem:   Acute calculous cholecystitis s/p cholecystectomy with oversew cystic duct 10/16/2020 Active Problems:   Hypertrophic obstructive cardiomyopathy (HCC)   HTN (hypertension)   Hypothyroidism   Obesity   MGUS (monoclonal gammopathy of unknown significance)   Atrial fibrillation (HCC)   Pacemaker   Hypokalemia   Hyponatremia   Transaminitis   Choledocholithiasis   Memory difficulty   Chronic anticoagulation  Abnormal LFT's/Transaminitis  improving Hyperbilirubinemia, improving -No CBD dilation on CT.  Other diagnostic considerations include Augmentin induced cholestasis.   -Of note, the stone was described as >3cm on imaging last hospitalization. -Continue to Hold statin -LFTs and bilirubin have now normalized -GI and General Surgery Consulted -Consulted GI re: MRCP vs ERCP; if MRCP needed, would need transfer to [facility] for MR  compatible pacer monitoring and I spoke with Dr. Cristina Gong personally who feels that she does not need an MRCP given her pacer and does not feel that she needs a perioperative ERCP or EUS at this time and recommend proceeding with a surgery and intraoperative cholangiogram at that is positive then at that time they were doing ERCP -She underwent a laparoscopic cholecystectomy with attempted intraoperative cholangiogram on 10/16/2020 but general surgery aws very concerned that she is at a very high risk for delayed postoperative bile leak given that she had horrible tissues requiring oversew of the cystic duct by Dr. Hassell Done.  We will Continue to follow LFTs and follow the drain output present she is at very high risk for cystic duct stump leak -Since drainage was felt to be biliousand concerning they are getting a HIDA scan to evaluate leak and may need an ERCP with stenting; Yesterday General Surgery felt output is more Serosanguinous and recommends close monitoring however today they feel like she is having a Bile Leak; HIDA scan was positive for brisk bile leak -Will hold off of Resumption of Anticoagulation until cleared by GI and general surgery -General surgery recommending continue IV antibiotics with Zosyn for 5 days (Day 4/5) and likley to be changed to po   -Now on a CLD and will be n.p.o. at midnight; Diet advancement per GI and General Surgeyr  -Have Notified GI Dr. Paulita Fujita about evaluation for ERCP and this will be done on 10/21/2020  Acute on chronic cholecystitis status post laparoscopic lysis of adhesions with cholecystectomy postoperative day 4 -PERC Chole drain had been removed but now has [employer] after surgery  -Monitor WBC and fever curve; she is afebrile and has no leukocytosis as her WBC is 7.9 -Initially was going to consult IR to replace drain (last dose Eliquis 3/7 in PM) however she did not undergo a cholecystectomy on Friday so we do not feel the need to reconsult IR -Follow  blood cultures x2; they showed no growth to date at 5 Days -Further care per general surgery and her Diet was advanced to a Heart Healthy Diet but now back on Clears  -General Surgery started the patient on IV antibiotics with IV ceftriaxone and he has been switched to IV Zosyn now -As above concern for Bile Leak so will be obtaining HIDA to evaluate and obtaining GI Evaluation for ERCP and stenting  -General Surgery and GI recommendations as above  -C/w Pain Control with Acetaminophen 1000 mg po TID, Judicious use of Oxycodone IR 2.5-5 mg po q4hprn given Confusion, and Fentanyl 25-50 mcg IV q1hprn Severe Pain and also Methocarbamol 1000 mg po q6hprn Muscle Spasms -Will need PT/OT to evaluate and Treat  Hyponatremia -Asymptomatic. -Sodium is now 135 -IV fluid hydration has now stopped  -Continue to monitor and trend and repeat CMP in a.m.  Hypokalemia -Improved as patient's potassium level of 3.4 -We will replete with p.o. potassium chloride -Continue to monitor and replete as necessary -IV fluid hydration is now stopped -Repeat CMP in a.m. -Mag level was 2.0 this morning  Hypomagnesemia -Patient's magnesium level was 2.0 -Continue to monitor and replete as  necessary -Repeat magnesium level in a.m.  Hypothyroidism -Continue Levothyroxine with 125 mcg p.o. Monday Tuesday Wednesday Thursday Friday Saturday and 62.5 mcg p.o. every Sunday -Check TSH and was 8.431 -We will check a free T4 and was 0.92 -We will not make any adjustments given that she is hospitalized but will need to have her repeat her thyroid function tests in about 4 to 6 weeks  Permanent Atrial Fibrillation -CHA2DS2-Vasc 4.  Given that low risk profile, bridging anticoagulation will more likely raise her risk of perioperative bleeding, and is unlikely to prevent a thromboembolic event (Douketsis Charolette Child 2015, Ayoub J Stroke Cerebrovasc Dis 2016). -Her rates are controlled -Continue to Hold apixaban for now  per general surgery recommendations as she is going go for ERCP tomorrow given her bile leak -Continue to hold off of anticoagulation and resume per general surgery and GI recommendations  Essential Hypertension -Continued extremely low-dose hydrochlorothiazide 12.5 mg p.o. daily but held now for her bile leak and ERCP in the morning -IV fluid hydration is now stopped -Continue to monitor blood pressures per protocol -Patient has IV hydralazine 10 mg every 6 as needed for his blood pressure greater than 400 or diastolic blood pressure 867 -Last blood pressure reading was 136/70  Thrombocytosis -Patient's platelet count is slowly trending up and went from 269 -> 289 -> 321-> 422 -> 349 -> 333 and today is 368 -Likely from Above -Continue to Monitor and Trend -Repeat CBC in the AM   MGUS -Currently Inactive and Clinically stable  HOCM -Avoid fluid shifts perioperatively to the extent able -Continuing to Hold HCTZ for now  DVT prophylaxis: SCDs Code Status: DO NOT RESUSCITATE  Family Communication: Discussed with Daughter at bedside  Disposition Plan: Pending further clinical improvement and clearance by General Surgery and Gastroenterology   Status is: Inpatient  Remains inpatient appropriate because:Unsafe d/c plan, IV treatments appropriate due to intensity of illness or inability to take PO and Inpatient level of care appropriate due to severity of illness   Dispo: The patient is from: Home              Anticipated d/c is to: TBD              Patient currently is not medically stable to d/c.   Difficult to place patient No  Consultants:   Gastroenterology  General Surgery    Procedures: Laparoscopic cholecystectomy with intraoperative cholangiogram   Antimicrobials:  Anti-infectives (From admission, onward)   Start     Dose/Rate Route Frequency Ordered Stop   10/17/20 1200  piperacillin-tazobactam (ZOSYN) IVPB 3.375 g        3.375 g 12.5 mL/hr over 240 Minutes  Intravenous Every 8 hours 10/17/20 1041 10/22/20 1159   10/17/20 1000  cefTRIAXone (ROCEPHIN) 2 g in sodium chloride 0.9 % 100 mL IVPB  Status:  Discontinued        2 g 200 mL/hr over 30 Minutes Intravenous Every 24 hours 10/16/20 1639 10/17/20 1041   10/15/20 1200  cefTRIAXone (ROCEPHIN) 2 g in sodium chloride 0.9 % 100 mL IVPB  Status:  Discontinued        2 g 200 mL/hr over 30 Minutes Intravenous Every 24 hours 10/15/20 0948 10/16/20 1639        Subjective: Seen and examined at bedside and states that her abdomen was sore and a little tender.  No nausea or vomiting.  Happy that her labs are improved.  Denies any lightheadedness, chest pain or shortness breath.  No other concerns or complaints at this time.  Objective: Vitals:   10/19/20 1723 10/19/20 2238 10/19/20 2244 10/20/20 0605  BP: 137/76 133/69 (!) 147/60 136/70  Pulse: 62 63 71 (!) 59  Resp:  18 18 18   Temp:  98.2 F (36.8 C) 98.2 F (36.8 C) 98 F (36.7 C)  TempSrc:  Oral Oral Oral  SpO2:  96% 100% 98%  Weight:      Height:        Intake/Output Summary (Last 24 hours) at 10/20/2020 1332 Last data filed at 10/20/2020 1313 Gross per 24 hour  Intake 245.86 ml  Output 3305 ml  Net -3059.14 ml   Filed Weights   10/13/20 0346  Weight: 76.2 kg   Examination: Physical Exam:  Constitutional: WN/WD overweight Caucasian female currently no acute distress sitting in the chair at bedside and is back to her normal mentation but complaining of some abdominal soreness Eyes: Lids and conjunctivae normal, sclerae anicteric  ENMT: External Ears, Nose appear normal. Grossly normal hearing.  Neck: Appears normal, supple, no cervical masses, normal ROM, no appreciable thyromegaly; no JVD Respiratory: Diminished to auscultation bilaterally, no wheezing, rales, rhonchi or crackles. Normal respiratory effort and patient is not tachypenic. No accessory muscle use.  Unlabored breathing Cardiovascular: RRR, no murmurs / rubs / gallops.  S1 and S2 auscultated.  Abdomen: Soft, tender to palpate, slightly distended secondary to body habitus and has a drain in the right upper quadrant in place. Bowel sounds positive.  GU: Deferred. Musculoskeletal: No clubbing / cyanosis of digits/nails. No joint deformity upper and lower extremities.  Skin: No rashes, lesions, ulcers on limited skin evaluation. No induration; Warm and dry.  Neurologic: CN 2-12 grossly intact with no focal deficits. Romberg sign and cerebellar reflexes not assessed.  Psychiatric: Normal judgment and insight. Alert and oriented x 3. Normal mood and appropriate affect.   Data Reviewed: I have personally reviewed following labs and imaging studies  CBC: Recent Labs  Lab 10/16/20 0519 10/17/20 0531 10/18/20 0558 10/19/20 0437 10/20/20 0520  WBC 5.5 9.5 7.9 6.5 6.1  NEUTROABS 3.2 8.0* 5.1 4.1 3.2  HGB 10.4* 12.6 10.2* 10.5* 11.1*  HCT 30.6* 37.3 30.2* 30.6* 32.9*  MCV 93.0 94.7 94.7 92.7 94.5  PLT 321 422* 349 333 604   Basic Metabolic Panel: Recent Labs  Lab 10/16/20 0519 10/17/20 0531 10/18/20 0558 10/19/20 0437 10/20/20 0520  NA 135 132* 131* 133* 135  K 3.7 4.2 3.5 3.7 3.4*  CL 103 98 98 102 101  CO2 24 23 25 25 24   GLUCOSE 98 127* 92 96 95  BUN <5* 8 8 6* 6*  CREATININE 0.70 0.77 0.81 0.52 0.75  CALCIUM 8.8* 9.1 8.6* 8.6* 8.7*  MG 1.9 1.7 1.7 1.7 2.0  PHOS 3.6 5.0* 3.4 3.4 3.5   GFR: Estimated Creatinine Clearance: 57 mL/min (by C-G formula based on SCr of 0.75 mg/dL). Liver Function Tests: Recent Labs  Lab 10/16/20 0519 10/17/20 0531 10/18/20 0558 10/19/20 0437 10/20/20 0520  AST 71* 72* 45* 33 28  ALT 93* 95* 64* 50* 43  ALKPHOS 205* 228* 163* 126 146*  BILITOT 1.4* 1.0 1.0 1.0 0.9  PROT 5.4* 6.6 5.6* 4.9* 5.4*  ALBUMIN 2.7* 3.2* 2.6* 2.4* 2.8*   Recent Labs  Lab 10/18/20 0558  LIPASE 23   No results for input(s): AMMONIA in the last 168 hours. Coagulation Profile: No results for input(s): INR, PROTIME in the last  168 hours. Cardiac Enzymes: No results for input(s):  CKTOTAL, CKMB, CKMBINDEX, TROPONINI in the last 168 hours. BNP (last 3 results) No results for input(s): PROBNP in the last 8760 hours. HbA1C: No results for input(s): HGBA1C in the last 72 hours. CBG: No results for input(s): GLUCAP in the last 168 hours. Lipid Profile: No results for input(s): CHOL, HDL, LDLCALC, TRIG, CHOLHDL, LDLDIRECT in the last 72 hours. Thyroid Function Tests: No results for input(s): TSH, T4TOTAL, FREET4, T3FREE, THYROIDAB in the last 72 hours. Anemia Panel: No results for input(s): VITAMINB12, FOLATE, FERRITIN, TIBC, IRON, RETICCTPCT in the last 72 hours. Sepsis Labs: No results for input(s): PROCALCITON, LATICACIDVEN in the last 168 hours.  Recent Results (from the past 240 hour(s))  Blood Culture (routine x 2)     Status: None   Collection Time: 10/13/20  3:45 AM   Specimen: BLOOD  Result Value Ref Range Status   Specimen Description   Final    BLOOD LEFT ANTECUBITAL Performed at [facility] [address] [place], [place] [address]    Special Requests   Final    BOTTLES DRAWN AEROBIC AND ANAEROBIC Blood Culture adequate volume Performed at [facility] [address] [place], [place] [address]    Culture   Final    NO GROWTH 5 DAYS Performed at [facility], [place] [address] [place], [place] [address]    Report Status 10/18/2020 FINAL  Final  Blood Culture (routine x 2)     Status: None   Collection Time: 10/13/20  3:45 AM   Specimen: BLOOD  Result Value Ref Range Status   Specimen Description   Final    BLOOD BLOOD LEFT FOREARM Performed at [facility] [address] [place], [place] [address]    Special Requests   Final    BOTTLES DRAWN AEROBIC AND ANAEROBIC Blood Culture adequate volume Performed at [facility] [address] [place], [place] [address]    Culture   Final    NO GROWTH 5  DAYS Performed at [facility], [place] [address] [place], [place] [address]    Report Status 10/18/2020 FINAL  Final  SARS CORONAVIRUS 2 (TAT 6-24 HRS) Nasopharyngeal Nasopharyngeal Swab     Status: None   Collection Time: 10/13/20  8:00 AM   Specimen: Nasopharyngeal Swab  Result Value Ref Range Status   SARS Coronavirus 2 NEGATIVE NEGATIVE Final    Comment: (NOTE) SARS-CoV-2 target nucleic acids are NOT DETECTED.  The SARS-CoV-2 RNA is generally detectable in upper and lower respiratory specimens during the acute phase of infection. Negative results do not preclude SARS-CoV-2 infection, do not rule out co-infections with other pathogens, and should not be used as the sole basis for treatment or other patient management decisions. Negative results must be combined with clinical observations, patient history, and epidemiological information. The expected result is Negative.  Fact Sheet for Patients: SugarRoll.be  Fact Sheet for Healthcare Providers: https://www.woods-mathews.com/  This test is not yet approved or cleared by the [place] FDA and  has been authorized for detection and/or diagnosis of SARS-CoV-2 by FDA under an Emergency Use Authorization (EUA). This EUA will remain  in effect (meaning this test can be used) for the duration of the COVID-19 declaration under Se ction 564(b)(1) of the Act, 21 U.S.C. section 360bbb-3(b)(1), unless the authorization is terminated or revoked sooner.  Performed at [facility], [place] [address], [place], [place] [address]   MRSA PCR Screening  Status: None   Collection Time: 10/15/20  6:01 AM   Specimen: Nasal Mucosa; Nasopharyngeal  Result Value Ref Range Status   MRSA by PCR NEGATIVE NEGATIVE Final    Comment:        The GeneXpert MRSA Assay (FDA approved for NASAL specimens only), is one component of a comprehensive MRSA colonization surveillance program. It is  not intended to diagnose MRSA infection nor to guide or monitor treatment for MRSA infections. Performed at [facility], [place] [address], [place], [place] [address]     RN Pressure Injury Documentation:     Estimated body mass index is 28.84 kg/m as calculated from the following:   Height as of this encounter: 5\' 4"  (1.626 m).   Weight as of this encounter: 76.2 kg.  Malnutrition Type:   Malnutrition Characteristics:   Nutrition Interventions:     Radiology Studies: NM HEPATOBILIARY LEAK (POST-SURGICAL)  Result Date: 10/19/2020 CLINICAL DATA:  Laparoscopic cholecystectomy. Evaluate for biliary leak. Initial percutaneous cholecystostomy drain was placed. Patient has a JP drain following cholecystectomy. EXAM: NUCLEAR MEDICINE HEPATOBILIARY IMAGING TECHNIQUE: Sequential images of the abdomen were obtained out to 60 minutes following intravenous administration of radiopharmaceutical. RADIOPHARMACEUTICALS:  5.2 mCi Tc-16m  Choletec IV COMPARISON:  None. FINDINGS: Surgical drain clamped for imaging. Prompt clearance radiotracer from the blood pool and homogeneous uptake in the liver. Counts are evident in the common bile duct by 15 minutes. At 20 minutes, there is clear biliary excreted radiotracer along the inferior margin of the RIGHT hepatic lobe. This extra biliary collection increases in intensity up to 30 minutes extending along the RIGHT pericolic gutter. IMPRESSION: Brisk bile leak with biliary excreted radiotracer collecting along the inferior margin RIGHT hepatic lobe extending into the RIGHT pericolic gutter. These results will be called to the ordering clinician or representative by the Radiologist Assistant, and communication documented in the PACS or [employer]. Electronically Signed   By: Suzy Bouchard M.D.   On: 10/19/2020 16:14   Scheduled Meds: . acetaminophen  1,000 mg Oral TID  . feeding supplement  237 mL Oral BID BM  . levothyroxine  125 mcg  Oral Once per day on Mon Tue Wed Thu Fri Sat  . levothyroxine  62.5 mcg Oral Q Sun  . lip balm  1 application Topical BID  . polycarbophil  625 mg Oral BID  . sodium chloride flush  3 mL Intravenous Q12H   Continuous Infusions: . sodium chloride    . methocarbamol (ROBAXIN) IV    . piperacillin-tazobactam (ZOSYN)  IV 3.375 g (10/20/20 1313)    LOS: 6 days   Kerney Elbe, DO [facility] PAGER is on [place]  If 7PM-7AM, please contact night-coverage [email or web address]

## 2020-10-21 ENCOUNTER — Inpatient Hospital Stay (HOSPITAL_COMMUNITY): Payer: Medicare Other | Admitting: Certified Registered Nurse Anesthetist

## 2020-10-21 ENCOUNTER — Inpatient Hospital Stay (HOSPITAL_COMMUNITY): Payer: Medicare Other

## 2020-10-21 ENCOUNTER — Encounter (HOSPITAL_COMMUNITY)
Admission: EM | Disposition: A | Discharge status: Home / Self Care | Payer: Self-pay | Source: Home / Self Care | Attending: Internal Medicine

## 2020-10-21 ENCOUNTER — Encounter (HOSPITAL_COMMUNITY): Payer: Self-pay | Admitting: Family Medicine

## 2020-10-21 DIAGNOSIS — I4821 Permanent atrial fibrillation: Secondary | ICD-10-CM

## 2020-10-21 DIAGNOSIS — I10 Essential (primary) hypertension: Secondary | ICD-10-CM | POA: Diagnosis not present

## 2020-10-21 DIAGNOSIS — K839 Disease of biliary tract, unspecified: Secondary | ICD-10-CM | POA: Diagnosis not present

## 2020-10-21 DIAGNOSIS — K8000 Calculus of gallbladder with acute cholecystitis without obstruction: Secondary | ICD-10-CM | POA: Diagnosis not present

## 2020-10-21 HISTORY — PX: SPHINCTEROTOMY: SHX5279

## 2020-10-21 HISTORY — PX: ERCP: SHX5425

## 2020-10-21 HISTORY — PX: BILIARY STENT PLACEMENT: SHX5538

## 2020-10-21 HISTORY — PX: PANCREATIC STENT PLACEMENT: SHX5539

## 2020-10-21 LAB — COMPREHENSIVE METABOLIC PANEL
ALT: 34 U/L (ref 0–44)
AST: 23 U/L (ref 15–41)
Albumin: 2.6 g/dL — ABNORMAL LOW (ref 3.5–5.0)
Alkaline Phosphatase: 132 U/L — ABNORMAL HIGH (ref 38–126)
Anion gap: 6 (ref 5–15)
BUN: 6 mg/dL — ABNORMAL LOW (ref 8–23)
CO2: 24 mmol/L (ref 22–32)
Calcium: 8.7 mg/dL — ABNORMAL LOW (ref 8.9–10.3)
Chloride: 105 mmol/L (ref 98–111)
Creatinine, Ser: 0.8 mg/dL (ref 0.44–1.00)
GFR, Estimated: >60 mL/min (ref >60)
Glucose, Bld: 92 mg/dL (ref 70–99)
Potassium: 4.3 mmol/L (ref 3.5–5.1)
Sodium: 135 mmol/L (ref 135–145)
Total Bilirubin: 1 mg/dL (ref 0.3–1.2)
Total Protein: 5.3 g/dL — ABNORMAL LOW (ref 6.5–8.1)

## 2020-10-21 LAB — CBC WITH DIFFERENTIAL/PLATELET
Abs Immature Granulocytes: 0.02 10*3/uL (ref 0.00–0.07)
Basophils Absolute: 0.1 10*3/uL (ref 0.0–0.1)
Basophils Relative: 1 %
Eosinophils Absolute: 0.4 10*3/uL (ref 0.0–0.5)
Eosinophils Relative: 6 %
HCT: 33.2 % — ABNORMAL LOW (ref 36.0–46.0)
Hemoglobin: 11 g/dL — ABNORMAL LOW (ref 12.0–15.0)
Immature Granulocytes: 0 %
Lymphocytes Relative: 23 %
Lymphs Abs: 1.6 10*3/uL (ref 0.7–4.0)
MCH: 32 pg (ref 26.0–34.0)
MCHC: 33.1 g/dL (ref 30.0–36.0)
MCV: 96.5 fL (ref 80.0–100.0)
Monocytes Absolute: 0.5 10*3/uL (ref 0.1–1.0)
Monocytes Relative: 8 %
Neutro Abs: 4.1 10*3/uL (ref 1.7–7.7)
Neutrophils Relative %: 62 %
Platelets: 365 10*3/uL (ref 150–400)
RBC: 3.44 MIL/uL — ABNORMAL LOW (ref 3.87–5.11)
RDW: 13.6 % (ref 11.5–15.5)
WBC: 6.7 10*3/uL (ref 4.0–10.5)
nRBC: 0 % (ref 0.0–0.2)

## 2020-10-21 LAB — MAGNESIUM: Magnesium: 1.7 mg/dL (ref 1.7–2.4)

## 2020-10-21 LAB — PHOSPHORUS: Phosphorus: 3.5 mg/dL (ref 2.5–4.6)

## 2020-10-21 SURGERY — ERCP, WITH INTERVENTION IF INDICATED
Anesthesia: General

## 2020-10-21 MED ORDER — GLUCAGON HCL RDNA (DIAGNOSTIC) 1 MG IJ SOLR
INTRAMUSCULAR | Status: DC | PRN
  Administered 2020-10-21 (×2): .5 mg via INTRAVENOUS

## 2020-10-21 MED ORDER — ROCURONIUM BROMIDE 10 MG/ML (PF) SYRINGE
PREFILLED_SYRINGE | INTRAVENOUS | Status: DC | PRN
  Administered 2020-10-21: 50 mg via INTRAVENOUS

## 2020-10-21 MED ORDER — LIDOCAINE 2% (20 MG/ML) 5 ML SYRINGE
INTRAMUSCULAR | Status: DC | PRN
  Administered 2020-10-21: 100 mg via INTRAVENOUS

## 2020-10-21 MED ORDER — INDOMETHACIN 50 MG RE SUPP
RECTAL | Status: AC
  Filled 2020-10-21: qty 2

## 2020-10-21 MED ORDER — PROPOFOL 10 MG/ML IV BOLUS
INTRAVENOUS | Status: AC
  Filled 2020-10-21: qty 20

## 2020-10-21 MED ORDER — DIPHENHYDRAMINE HCL 50 MG/ML IJ SOLN
INTRAMUSCULAR | Status: DC | PRN
  Administered 2020-10-21: 6.25 mg via INTRAVENOUS

## 2020-10-21 MED ORDER — INDOMETHACIN 50 MG RE SUPP
RECTAL | Status: DC | PRN
  Administered 2020-10-21: 100 mg via RECTAL

## 2020-10-21 MED ORDER — ONDANSETRON HCL 4 MG/2ML IJ SOLN
INTRAMUSCULAR | Status: DC | PRN
  Administered 2020-10-21: 4 mg via INTRAVENOUS

## 2020-10-21 MED ORDER — PROPOFOL 10 MG/ML IV BOLUS
INTRAVENOUS | Status: DC | PRN
  Administered 2020-10-21: 100 mg via INTRAVENOUS

## 2020-10-21 MED ORDER — LACTATED RINGERS IV SOLN: INTRAVENOUS | Status: DC | PRN

## 2020-10-21 MED ORDER — PHENYLEPHRINE HCL-NACL 20-0.9 MG/250ML-% IV SOLN
INTRAVENOUS | Status: DC | PRN
  Administered 2020-10-21: 20 ug/min via INTRAVENOUS

## 2020-10-21 MED ORDER — CIPROFLOXACIN IN D5W 400 MG/200ML IV SOLN
INTRAVENOUS | Status: AC
  Filled 2020-10-21: qty 200

## 2020-10-21 MED ORDER — PHENYLEPHRINE 40 MCG/ML (10ML) SYRINGE FOR IV PUSH (FOR BLOOD PRESSURE SUPPORT)
PREFILLED_SYRINGE | INTRAVENOUS | Status: DC | PRN
  Administered 2020-10-21 (×3): 80 ug via INTRAVENOUS

## 2020-10-21 MED ORDER — FENTANYL CITRATE (PF) 100 MCG/2ML IJ SOLN
INTRAMUSCULAR | Status: AC
  Filled 2020-10-21: qty 2

## 2020-10-21 MED ORDER — FENTANYL CITRATE (PF) 100 MCG/2ML IJ SOLN
INTRAMUSCULAR | Status: DC | PRN
  Administered 2020-10-21 (×2): 50 ug via INTRAVENOUS

## 2020-10-21 MED ORDER — SODIUM CHLORIDE 0.9 % IV SOLN: INTRAVENOUS | Status: DC

## 2020-10-21 MED ORDER — SUGAMMADEX SODIUM 200 MG/2ML IV SOLN
INTRAVENOUS | Status: DC | PRN
  Administered 2020-10-21: 200 mg via INTRAVENOUS

## 2020-10-21 MED ORDER — SODIUM CHLORIDE 0.9 % IV SOLN
INTRAVENOUS | Status: DC | PRN
  Administered 2020-10-21: 25 mL

## 2020-10-21 MED ORDER — CIPROFLOXACIN IN D5W 400 MG/200ML IV SOLN
400.0000 mg | INTRAVENOUS | Status: AC
  Administered 2020-10-21: 400 mg via INTRAVENOUS

## 2020-10-21 MED ORDER — DEXAMETHASONE SODIUM PHOSPHATE 10 MG/ML IJ SOLN
INTRAMUSCULAR | Status: DC | PRN
  Administered 2020-10-21: 6 mg via INTRAVENOUS

## 2020-10-21 MED ORDER — GLUCAGON HCL RDNA (DIAGNOSTIC) 1 MG IJ SOLR
INTRAMUSCULAR | Status: AC
  Filled 2020-10-21: qty 1

## 2020-10-21 NOTE — Op Note (Signed)
[facility] Patient Name: Tracey Mcclure Procedure Date: 10/21/2020 MRN: [id number] Attending MD: Arta Silence , MD Date of Birth: March 24, 1941 CSN: [id number] Age: 80 Admit Type: Inpatient Procedure:                ERCP Indications:              Bile leak, Elevated liver enzymes Providers:                Arta Silence, MD, Baird Cancer, RN, Laverda Sorenson, Technician, Christell Faith, CRNA Referring MD:             [id number] Surgery Medicines:                General Anesthesia, Cipro 400 mg IV, Glucagon 1 mg                            IV, Indomethacin 282 mg PR Complications:            No immediate complications. Estimated Blood Loss:     Estimated blood loss: none. Procedure:                Pre-Anesthesia Assessment:                           - Prior to the procedure, a History and Physical                            was performed, and patient medications and                            allergies were reviewed. The patient's tolerance of                            previous anesthesia was also reviewed. The risks                            and benefits of the procedure and the sedation                            options and risks were discussed with the patient.                            All questions were answered, and informed consent                            was obtained. Prior Anticoagulants: The patient has                            taken Eliquis (apixaban), last dose was 10 days                            prior to procedure. ASA Grade Assessment: III - A  patient with severe systemic disease. After                            reviewing the risks and benefits, the patient was                            deemed in satisfactory condition to undergo the                            procedure.                           After obtaining informed consent, the scope was                            passed under  direct vision. Throughout the                            procedure, the patient's blood pressure, pulse, and                            oxygen saturations were monitored continuously. The                            [employer] single use                            duodenoscope was introduced through the mouth, and                            used to inject contrast into and used to cannulate                            the bile duct. The ERCP was accomplished without                            difficulty. The patient tolerated the procedure                            well. Scope In: Scope Out: Findings:      A scout film of the abdomen was obtained. Surgical clips, consistent       with a previous cholecystectomy, were seen in the area of the right       upper quadrant of the abdomen. The esophagus was successfully intubated       under direct vision. The scope was advanced to a normal major papilla in       the descending duodenum without detailed examination of the pharynx,       larynx and associated structures, and upper GI tract. The upper GI tract       was grossly normal. Initial attempts showed preferential cannulation of       the pancreatic duct, thus one 5 Fr by 5 cm plastic stent with a full       external pigtail and a single internal flap was placed 4 cm into the  ventral pancreatic duct. Clear fluid flowed through the stent. The stent       was in good position. The bile duct was subsequently deeply cannulated.       Contrast was injected. I personally interpreted the bile duct images.       Ductal flow of contrast was adequate. Non-dilated but sigmoid-shaped       distal bile duct. A long cystic duct stump originated in the middle       third of the main bile duct. Bile leak seen arising from the cystic duct       stump. Questionable distal bile duct filling defect, but no stones       removed upon a couple basket sweeps of the bile duct. For  management of       the bile leak, a small biliary sphincterotomy was performed with blended       current and then one 8.5 Fr by 5 cm plastic stent with a single external       flap and a single internal flap was placed 4.5 cm into the common bile       duct. Bile flowed through the stent. The stent was in good position,       both endoscopically and fluoroscopically. Impression:               - The patient has had a cholecystectomy.                           - Bile leak, cystic duct stump, treated with                            biliary sphincterotomy and bile duct stent                            placement.                           - One plastic stent was placed into the ventral                            pancreatic duct.                           - One plastic stent was placed into the common bile                            duct. Moderate Sedation:      None Recommendation:           - Avoid aspirin and nonsteroidal anti-inflammatory                            medicines for 3 days.                           - Clear liquid diet today.                           - Continue present medications.                           -  [facility] will follow.                           - Will need repeat EGD/ERCP for stent removal and                            repeat cholangiogram in 6-8 weeks. Procedure Code(s):        --- Professional ---                           [phone], Endoscopic retrograde                            cholangiopancreatography (ERCP); with placement of                            endoscopic stent into biliary or pancreatic duct,                            including pre- and post-dilation and guide wire                            passage, when performed, including sphincterotomy,                            when performed, each stent                           [id number], 2, Endoscopic retrograde                            cholangiopancreatography (ERCP); with placement of                             endoscopic stent into biliary or pancreatic duct,                            including pre- and post-dilation and guide wire                            passage, when performed, including sphincterotomy,                            when performed, each stent Diagnosis Code(s):        --- Professional ---                           Z90.49, Acquired absence of other specified parts                            of digestive tract                           R74.8, Abnormal levels of other serum enzymes  K83.8, Other specified diseases of biliary tract CPT copyright 2019 American Medical Association. All rights reserved. The codes documented in this report are preliminary and upon coder review may  be revised to meet current compliance requirements. Arta Silence, MD 10/21/2020 1:34:16 PM This report has been signed electronically. Number of Addenda: 0

## 2020-10-21 NOTE — Progress Notes (Signed)
PROGRESS NOTE    Tracey Mcclure  QBH:[id number] DOB: 10/24/1940 DOA: 10/13/2020 PCP: Crist Infante, MD   Brief Narrative: Tracey Mcclure is a 80 y.o. female with a history of permanent atrial fibrillation, sick sinus syndrome s/p pacemaker, HOCM, hypertension, MGUS, hypothyroidism. Patient presented secondary to abdominal pain, nausea, malaise in setting of acute on chronic cholecystitis recently treated with a percutaneous drain and antibiotics. Patient underwent laparoscopic cholecystectomy in addition to biliary/pancreatic duct stent placement for bile leak.   Assessment & Plan:   Principal Problem:   Acute calculous cholecystitis s/p cholecystectomy with oversew cystic duct 10/16/2020 Active Problems:   Hypertrophic obstructive cardiomyopathy (HCC)   HTN (hypertension)   Hypothyroidism   Obesity   MGUS (monoclonal gammopathy of unknown significance)   Atrial fibrillation (HCC)   Pacemaker   Hypokalemia   Hyponatremia   Transaminitis   Choledocholithiasis   Memory difficulty   Chronic anticoagulation  Bile leak Acute on chronic cholecystitis Repeat drain placed on admission. General surgery consulted and patient underwent laparoscopic cholecystecomy on 3/11; attempted intraoperative cholangiogram resulted in extravasation. GI consulted and performed ERCP on 3/16 with biliary sphincterotomy performed in addition to bile duct and ventral pancreatic duct plastic stent placement. Zosyn IV x5 days prescribed. -General surgery recommendations -GI recommendations: CLD, avoid aspirin/NSAIDs x3 days, repeat EGD/ERCP in 6-8 weeks for stent removal/repeat cholangiogram  Hyponatremia Mild. Resolved.  Hypokalemia Mild. Resolved.  Hypomagnesemia Mild. Resolved.  Hypothyroidism -Continue Synthroid 125 mcg daily  Permanent atrial fibrillation History of sick sinus syndrome Rate controlled. CHA2DS2-VASc Score is 4. History of pacer placement. Patient is on Eliquis as an  outpatient. Not on rate or rhythm control medication. Eliquis held secondary to inpatient procedures -Resume anticoagulation per GI/General surgery recommendations  Primary hypertension Patient is on hydrochlorothiazide as an outpatient.  Hyperlipidemia Patient is on Lipitor 40 mg daily which was held on admission -Resume home Lipitor  Thrombocytosis Transient. Resolved.  Hypoalbuminemia -Dietitian consult  MGUS Inactive.  HOCM Noted.   DVT prophylaxis: SCDs Code Status:   Code Status: DNR Family Communication: None at bedside Disposition Plan: Discharge likely in 24-72 hours pending GI and general surgery recommendations   Consultants:   General surgery  Gastroenterology [facility])  Procedures:   LAPAROSCOPIC CHOLECYSTECTOMY and PERIOPERATIVE CHOLANGIOGRAM (10/16/2020)  ERCP (10/21/2020) Impression:               - The patient has had a cholecystectomy.                           - Bile leak, cystic duct stump, treated with                            biliary sphincterotomy and bile duct stent                            placement.                           - One plastic stent was placed into the ventral                            pancreatic duct.                           -  One plastic stent was placed into the common bile                            duct. Recommendation:           - Avoid aspirin and nonsteroidal anti-inflammatory                            medicines for 3 days.                           - Clear liquid diet today.                           - Continue present medications.                           [facility] will follow.                           - Will need repeat EGD/ERCP for stent removal and                            repeat cholangiogram in 6-8 weeks.  Antimicrobials:  Zosyn IV    Subjective: No significant issues overnight. Had two bowel movements.  Objective: Vitals:   10/20/20 0605 10/20/20 1417 10/20/20 2109 10/21/20 0457  BP:  136/70 127/72 (!) 141/97 (!) 149/70  Pulse: (!) 59 60 (!) 59 60  Resp: 18 17 16 15   Temp: 98 F (36.7 C) 98.3 F (36.8 C) 98.1 F (36.7 C) 98.1 F (36.7 C)  TempSrc: Oral Oral Oral Oral  SpO2: 98% 98% 98% 99%  Weight:      Height:        Intake/Output Summary (Last 24 hours) at 10/21/2020 0838 Last data filed at 10/21/2020 0816 Gross per 24 hour  Intake 1058.45 ml  Output 2505 ml  Net -1446.55 ml   Filed Weights   10/13/20 0346  Weight: 76.2 kg    Examination:  General exam: Appears calm and comfortable Respiratory system: Clear to auscultation. Respiratory effort normal. Cardiovascular system: S1 & S2 heard, RRR. 2/6 systolic murmur Gastrointestinal system: Abdomen is nondistended, soft and mildly tender. Biliary drain significant for brown colored fluid. No organomegaly or masses felt. Normal bowel sounds heard. Central nervous system: Alert and oriented. No focal neurological deficits. Musculoskeletal: 1+ edema. No calf tenderness Skin: No cyanosis. No rashes Psychiatry: Judgement and insight appear normal. Mood & affect appropriate.     Data Reviewed: I have personally reviewed following labs and imaging studies  CBC Lab Results  Component Value Date   WBC 6.7 10/21/2020   RBC 3.44 (L) 10/21/2020   HGB 11.0 (L) 10/21/2020   HCT 33.2 (L) 10/21/2020   MCV 96.5 10/21/2020   MCH 32.0 10/21/2020   PLT 365 10/21/2020   MCHC 33.1 10/21/2020   RDW 13.6 10/21/2020   LYMPHSABS 1.6 10/21/2020   MONOABS 0.5 10/21/2020   EOSABS 0.4 10/21/2020   BASOSABS 0.1 57/32/2025     Last metabolic panel Lab Results  Component Value Date   NA 135 10/21/2020   K 4.3 10/21/2020   CL 105 10/21/2020   CO2 24 10/21/2020   BUN 6 (L) 10/21/2020  CREATININE 0.80 10/21/2020   GLUCOSE 92 10/21/2020   GFRNONAA >60 10/21/2020   GFRAA 61 05/19/2020   CALCIUM 8.7 (L) 10/21/2020   PHOS 3.5 10/21/2020   PROT 5.3 (L) 10/21/2020   ALBUMIN 2.6 (L) 10/21/2020   LABGLOB 3.0  02/16/2018   BILITOT 1.0 10/21/2020   ALKPHOS 132 (H) 10/21/2020   AST 23 10/21/2020   ALT 34 10/21/2020   ANIONGAP 6 10/21/2020    CBG (last 3)  No results for input(s): GLUCAP in the last 72 hours.   GFR: Estimated Creatinine Clearance: 57 mL/min (by C-G formula based on SCr of 0.8 mg/dL).  Coagulation Profile: No results for input(s): INR, PROTIME in the last 168 hours.  Recent Results (from the past 240 hour(s))  Blood Culture (routine x 2)     Status: None   Collection Time: 10/13/20  3:45 AM   Specimen: BLOOD  Result Value Ref Range Status   Specimen Description   Final    BLOOD LEFT ANTECUBITAL Performed at [facility] [address] [place], [place] [address]    Special Requests   Final    BOTTLES DRAWN AEROBIC AND ANAEROBIC Blood Culture adequate volume Performed at [facility] [address], [place], [place] [address]    Culture   Final    NO GROWTH 5 DAYS Performed at [facility], [place] [address] [place], [place] [address]    Report Status 10/18/2020 FINAL  Final  Blood Culture (routine x 2)     Status: None   Collection Time: 10/13/20  3:45 AM   Specimen: BLOOD  Result Value Ref Range Status   Specimen Description   Final    BLOOD BLOOD LEFT FOREARM Performed at [facility] [address] [place], [place] [address]    Special Requests   Final    BOTTLES DRAWN AEROBIC AND ANAEROBIC Blood Culture adequate volume Performed at [facility] [address] [place], [place] [address]    Culture   Final    NO GROWTH 5 DAYS Performed at [facility], [place] [address], [place], [place] [address]    Report Status 10/18/2020 FINAL  Final  SARS CORONAVIRUS 2 (TAT 6-24 HRS) Nasopharyngeal Nasopharyngeal Swab     Status: None   Collection Time: 10/13/20  8:00 AM   Specimen: Nasopharyngeal Swab  Result Value Ref Range Status   SARS Coronavirus 2 NEGATIVE NEGATIVE Final     Comment: (NOTE) SARS-CoV-2 target nucleic acids are NOT DETECTED.  The SARS-CoV-2 RNA is generally detectable in upper and lower respiratory specimens during the acute phase of infection. Negative results do not preclude SARS-CoV-2 infection, do not rule out co-infections with other pathogens, and should not be used as the sole basis for treatment or other patient management decisions. Negative results must be combined with clinical observations, patient history, and epidemiological information. The expected result is Negative.  Fact Sheet for Patients: SugarRoll.be  Fact Sheet for Healthcare Providers: https://www.woods-mathews.com/  This test is not yet approved or cleared by the [place] FDA and  has been authorized for detection and/or diagnosis of SARS-CoV-2 by FDA under an Emergency Use Authorization (EUA). This EUA will remain  in effect (meaning this test can be used) for the duration of the COVID-19 declaration under Se ction 564(b)(1) of the Act, 21 U.S.C. section 360bbb-3(b)(1), unless the authorization is terminated or revoked sooner.  Performed at [facility], [place] [address]  [place], [place] [address]   MRSA PCR Screening     Status: None   Collection Time: 10/15/20  6:01 AM   Specimen: Nasal Mucosa; Nasopharyngeal  Result Value Ref Range Status   MRSA by PCR NEGATIVE NEGATIVE Final    Comment:        The GeneXpert MRSA Assay (FDA approved for NASAL specimens only), is one component of a comprehensive MRSA colonization surveillance program. It is not intended to diagnose MRSA infection nor to guide or monitor treatment for MRSA infections. Performed at [facility], [place] [address], [place], [place] [address]         Radiology Studies: NM HEPATOBILIARY LEAK (POST-SURGICAL)  Result Date: 10/19/2020 CLINICAL DATA:  Laparoscopic cholecystectomy. Evaluate for biliary leak. Initial  percutaneous cholecystostomy drain was placed. Patient has a JP drain following cholecystectomy. EXAM: NUCLEAR MEDICINE HEPATOBILIARY IMAGING TECHNIQUE: Sequential images of the abdomen were obtained out to 60 minutes following intravenous administration of radiopharmaceutical. RADIOPHARMACEUTICALS:  5.2 mCi Tc-25m  Choletec IV COMPARISON:  None. FINDINGS: Surgical drain clamped for imaging. Prompt clearance radiotracer from the blood pool and homogeneous uptake in the liver. Counts are evident in the common bile duct by 15 minutes. At 20 minutes, there is clear biliary excreted radiotracer along the inferior margin of the RIGHT hepatic lobe. This extra biliary collection increases in intensity up to 30 minutes extending along the RIGHT pericolic gutter. IMPRESSION: Brisk bile leak with biliary excreted radiotracer collecting along the inferior margin RIGHT hepatic lobe extending into the RIGHT pericolic gutter. These results will be called to the ordering clinician or representative by the Radiologist Assistant, and communication documented in the PACS or [employer]. Electronically Signed   By: Suzy Bouchard M.D.   On: 10/19/2020 16:14        Scheduled Meds: . acetaminophen  1,000 mg Oral TID  . feeding supplement  237 mL Oral BID BM  . levothyroxine  125 mcg Oral Once per day on Mon Tue Wed Thu Fri Sat  . levothyroxine  62.5 mcg Oral Q Sun  . lip balm  1 application Topical BID  . polycarbophil  625 mg Oral BID  . potassium chloride  40 mEq Oral BID  . sodium chloride flush  3 mL Intravenous Q12H   Continuous Infusions: . sodium chloride    . sodium chloride 20 mL/hr at 10/21/20 0232  . ciprofloxacin    . methocarbamol (ROBAXIN) IV    . piperacillin-tazobactam (ZOSYN)  IV 3.375 g (10/21/20 0358)     LOS: 7 days     Cordelia Poche, MD [facility] 10/21/2020, 8:38 AM  If 7PM-7AM, please contact night-coverage [email or web address]

## 2020-10-21 NOTE — Interval H&P Note (Signed)
History and Physical Interval Note:  10/21/2020 11:33 AM  Tracey Mcclure  has presented today for surgery, with the diagnosis of bile leak.  The various methods of treatment have been discussed with the patient and family. After consideration of risks, benefits and other options for treatment, the patient has consented to  Procedure(s): ENDOSCOPIC RETROGRADE CHOLANGIOPANCREATOGRAPHY (ERCP) (N/A) as a surgical intervention.  The patient's history has been reviewed, patient examined, no change in status, stable for surgery.  I have reviewed the patient's chart and labs.  Questions were answered to the patient's satisfaction.     Landry Dyke

## 2020-10-21 NOTE — Anesthesia Procedure Notes (Signed)
Procedure Name: Intubation Date/Time: 10/21/2020 11:45 AM Performed by: West Pugh, CRNA Pre-anesthesia Checklist: Patient identified, Emergency Drugs available, Suction available, Patient being monitored and Timeout performed Patient Re-evaluated:Patient Re-evaluated prior to induction Oxygen Delivery Method: Circle system utilized Preoxygenation: Pre-oxygenation with 100% oxygen Induction Type: IV induction Ventilation: Mask ventilation without difficulty Laryngoscope Size: Mac and 3 Grade View: Grade II Tube type: Oral Tube size: 7.0 mm Number of attempts: 1 Airway Equipment and Method: Stylet Placement Confirmation: ETT inserted through vocal cords under direct vision,  positive ETCO2,  CO2 detector and breath sounds checked- equal and bilateral Secured at: 22 cm Tube secured with: Tape Dental Injury: Teeth and Oropharynx as per pre-operative assessment

## 2020-10-21 NOTE — Progress Notes (Signed)
PT Cancellation Note  Patient Details Name: Tracey Mcclure MRN: [id number] DOB: [birth year]   Cancelled Treatment:    Reason Eval/Treat Not Completed: Patient at procedure or test/unavailable. Will check back as schedule allows. Thanks.    [facility]  Office: [phone] Pager: [phone]

## 2020-10-21 NOTE — Transfer of Care (Signed)
Immediate Anesthesia Transfer of Care Note  Patient: Tracey Mcclure  Procedure(s) Performed: ENDOSCOPIC RETROGRADE CHOLANGIOPANCREATOGRAPHY (ERCP) (N/A )  Patient Location: PACU and Endoscopy Unit  Anesthesia Type:General  Level of Consciousness: awake, alert  and patient cooperative  Airway & Oxygen Therapy: Patient Spontanous Breathing and Patient connected to face mask oxygen  Post-op Assessment: Report given to RN and Post -op Vital signs reviewed and stable  Post vital signs: Reviewed and stable  Last Vitals:  Vitals Value Taken Time  BP 166/70 10/21/20 1334  Temp    Pulse 60 10/21/20 1337  Resp 18 10/21/20 1337  SpO2 100 % 10/21/20 1337  Vitals shown include unvalidated device data.  Last Pain:  Vitals:   10/21/20 1100  TempSrc: Oral  PainSc: 1       Patients Stated Pain Goal: 2 (82/51/89 8421)  Complications: No complications documented.

## 2020-10-21 NOTE — Progress Notes (Signed)
5 Days Post-Op  Subjective: No new complaints today.  Confusion on certain things is a little worse today.  She understands that she is getting things confused.  States she already has short term memory issues.  ROS: See above, otherwise other systems negative  Objective: Vital signs in last 24 hours: Temp:  [98.1 F (36.7 C)-98.3 F (36.8 C)] 98.1 F (36.7 C) (03/16 0457) Pulse Rate:  [59-60] 60 (03/16 0457) Resp:  [15-17] 15 (03/16 0457) BP: (127-149)/(70-97) 149/70 (03/16 0457) SpO2:  [98 %-99 %] 99 % (03/16 0457) Last BM Date: 10/16/20  Intake/Output from previous day: 03/15 0701 - 03/16 0700 In: 1058.5 [P.O.:840; I.V.:69.3; IV Piggyback:149.1] Out: 2305 [Urine:1850; Drains:455] Intake/Output this shift: Total I/O In: 0  Out: 250 [Urine:200; Drains:50]  PE: Abd: soft, appropriately tender, JP drain with copious bilious output, 455cc yesterday.  Incisions c/d/i   Lab Results:  Recent Labs    10/20/20 0520 10/21/20 0437  WBC 6.1 6.7  HGB 11.1* 11.0*  HCT 32.9* 33.2*  PLT 368 365   BMET Recent Labs    10/20/20 0520 10/21/20 0437  NA 135 135  K 3.4* 4.3  CL 101 105  CO2 24 24  GLUCOSE 95 92  BUN 6* 6*  CREATININE 0.75 0.80  CALCIUM 8.7* 8.7*   PT/INR No results for input(s): LABPROT, INR in the last 72 hours. CMP     Component Value Date/Time   NA 135 10/21/2020 0437   NA 137 05/19/2020 1455   NA 137 02/10/2017 0954   K 4.3 10/21/2020 0437   K 3.9 02/10/2017 0954   CL 105 10/21/2020 0437   CO2 24 10/21/2020 0437   CO2 23 02/10/2017 0954   GLUCOSE 92 10/21/2020 0437   GLUCOSE 87 02/10/2017 0954   BUN 6 (L) 10/21/2020 0437   BUN 11 05/19/2020 1455   BUN 14.8 02/10/2017 0954   CREATININE 0.80 10/21/2020 0437   CREATININE 1.0 02/10/2017 0954   CALCIUM 8.7 (L) 10/21/2020 0437   CALCIUM 10.1 02/10/2017 0954   PROT 5.3 (L) 10/21/2020 0437   PROT 7.0 02/10/2017 0954   PROT 6.4 02/10/2017 0953   ALBUMIN 2.6 (L) 10/21/2020 0437   ALBUMIN 3.7  02/10/2017 0954   AST 23 10/21/2020 0437   AST 21 02/10/2017 0954   ALT 34 10/21/2020 0437   ALT 14 02/10/2017 0954   ALKPHOS 132 (H) 10/21/2020 0437   ALKPHOS 115 02/10/2017 0954   BILITOT 1.0 10/21/2020 0437   BILITOT 0.66 02/10/2017 0954   GFRNONAA >60 10/21/2020 0437   GFRAA 61 05/19/2020 1455   Lipase     Component Value Date/Time   LIPASE 23 10/18/2020 0558       Studies/Results: NM HEPATOBILIARY LEAK (POST-SURGICAL)  Result Date: 10/19/2020 CLINICAL DATA:  Laparoscopic cholecystectomy. Evaluate for biliary leak. Initial percutaneous cholecystostomy drain was placed. Patient has a JP drain following cholecystectomy. EXAM: NUCLEAR MEDICINE HEPATOBILIARY IMAGING TECHNIQUE: Sequential images of the abdomen were obtained out to 60 minutes following intravenous administration of radiopharmaceutical. RADIOPHARMACEUTICALS:  5.2 mCi Tc-29m  Choletec IV COMPARISON:  None. FINDINGS: Surgical drain clamped for imaging. Prompt clearance radiotracer from the blood pool and homogeneous uptake in the liver. Counts are evident in the common bile duct by 15 minutes. At 20 minutes, there is clear biliary excreted radiotracer along the inferior margin of the RIGHT hepatic lobe. This extra biliary collection increases in intensity up to 30 minutes extending along the RIGHT pericolic gutter. IMPRESSION: Brisk bile leak with  biliary excreted radiotracer collecting along the inferior margin RIGHT hepatic lobe extending into the RIGHT pericolic gutter. These results will be called to the ordering clinician or representative by the Radiologist Assistant, and communication documented in the PACS or [employer]. Electronically Signed   By: Suzy Bouchard M.D.   On: 10/19/2020 16:14    Anti-infectives: Anti-infectives (From admission, onward)   Start     Dose/Rate Route Frequency Ordered Stop   10/21/20 1030  ciprofloxacin (CIPRO) IVPB 400 mg        400 mg 200 mL/hr over 60 Minutes Intravenous 60  min pre-op 10/21/20 0048     10/17/20 1200  piperacillin-tazobactam (ZOSYN) IVPB 3.375 g        3.375 g 12.5 mL/hr over 240 Minutes Intravenous Every 8 hours 10/17/20 1041 10/22/20 1159   10/17/20 1000  cefTRIAXone (ROCEPHIN) 2 g in sodium chloride 0.9 % 100 mL IVPB  Status:  Discontinued        2 g 200 mL/hr over 30 Minutes Intravenous Every 24 hours 10/16/20 1639 10/17/20 1041   10/15/20 1200  cefTRIAXone (ROCEPHIN) 2 g in sodium chloride 0.9 % 100 mL IVPB  Status:  Discontinued        2 g 200 mL/hr over 30 Minutes Intravenous Every 24 hours 10/15/20 0948 10/16/20 1639       Assessment/Plan Atrial fibrillation on chronic anticoagulation last dose Eliquis p.m. 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 Hassell Done 3/11, for cholecystitis, s/p perc chole drain placement and dislodgement without being able to be replaced -+ bile leak not unexpected -await ERCP per GI -cont ABX therapy x5 days post op -pulm toilet, mobilize,IS  FEN: NPO for procedure/IV fluids ID: None DVT: Eliquis last dose p.m. 10/12/2020/SCD, ok with lovenox from surgery standpoint    LOS: 7 days    Tracey Mcclure , [facility] 10/21/2020, 10:03 AM Please see Amion for pager number during day hours 7:00am-4:30pm or 7:00am -11:30am on weekends

## 2020-10-21 NOTE — Anesthesia Preprocedure Evaluation (Signed)
Anesthesia Evaluation  Patient identified by MRN, date of birth, ID band Patient awake    Reviewed: Allergy & Precautions, NPO status , Patient's Chart, lab work & pertinent test results  Airway Mallampati: I  TM Distance: >3 FB Neck ROM: Full    Dental no notable dental hx.    Pulmonary neg pulmonary ROS,    Pulmonary exam normal breath sounds clear to auscultation       Cardiovascular hypertension, Pt. on medications Normal cardiovascular exam+ dysrhythmias Atrial Fibrillation + pacemaker + Valvular Problems/Murmurs MR  Rhythm:Regular Rate:Normal  ECG: SR, rate 60  ECHO: 1. Left ventricular ejection fraction, by estimation, is 65 to 70%. The left ventricle has normal function. The left ventricle has no regional wall motion abnormalities. There is severe asymmetric left ventricular  hypertrophy of the basal-septal  segment. No significant LVOT gradient at rest. Left ventricular diastolic parameters are indeterminate.  2. Right ventricular systolic function is normal. The right ventricular size is normal. There is mildly elevated pulmonary artery systolic  pressure. The estimated right ventricular systolic pressure is 00.7 mmHg.  3. Left atrial size was severely dilated.  4. The mitral valve is abnormal. Moderate mitral valve regurgitation. No evidence of mitral stenosis. Severe mitral annular calcification.  5. Tricuspid valve regurgitation is moderate.  6. The aortic valve is tricuspid. Aortic valve regurgitation is trivial.  No aortic stenosis is present.  7. Aortic dilatation noted. There is dilatation of the ascending aorta, measuring 43 mm.  8. The inferior vena cava is normal in size with greater than 50% respiratory variability, suggesting right atrial pressure of 3 mmHg.   Hypertrophic obstructive cardiomyopathy    Neuro/Psych negative neurological ROS  negative psych ROS   GI/Hepatic Neg liver ROS, GERD  ,   Endo/Other  Hypothyroidism   Renal/GU negative Renal ROS     Musculoskeletal negative musculoskeletal ROS (+)   Abdominal   Peds  Hematology  (+) Blood dyscrasia, anemia , HLD   Anesthesia Other Findings   Reproductive/Obstetrics                             Anesthesia Physical  Anesthesia Plan  ASA: III  Anesthesia Plan: General   Post-op Pain Management:    Induction: Intravenous  PONV Risk Score and Plan: 4 or greater and Ondansetron, Dexamethasone, Treatment may vary due to age or medical condition and Propofol infusion  Airway Management Planned: Oral ETT  Additional Equipment:   Intra-op Plan:   Post-operative Plan: Extubation in OR  Informed Consent: I have reviewed the patients History and Physical, chart, labs and discussed the procedure including the risks, benefits and alternatives for the proposed anesthesia with the patient or authorized representative who has indicated his/her understanding and acceptance.   Patient has DNR.  Suspend DNR.   Dental advisory given  Plan Discussed with: CRNA and Anesthesiologist  Anesthesia Plan Comments:         Anesthesia Quick Evaluation

## 2020-10-22 ENCOUNTER — Encounter (HOSPITAL_COMMUNITY): Payer: Self-pay | Admitting: Gastroenterology

## 2020-10-22 ENCOUNTER — Inpatient Hospital Stay (HOSPITAL_COMMUNITY): Payer: Medicare Other

## 2020-10-22 DIAGNOSIS — I4821 Permanent atrial fibrillation: Secondary | ICD-10-CM | POA: Diagnosis not present

## 2020-10-22 DIAGNOSIS — K839 Disease of biliary tract, unspecified: Secondary | ICD-10-CM | POA: Diagnosis not present

## 2020-10-22 DIAGNOSIS — K8000 Calculus of gallbladder with acute cholecystitis without obstruction: Secondary | ICD-10-CM | POA: Diagnosis not present

## 2020-10-22 DIAGNOSIS — I10 Essential (primary) hypertension: Secondary | ICD-10-CM | POA: Diagnosis not present

## 2020-10-22 LAB — COMPREHENSIVE METABOLIC PANEL
ALT: 29 U/L (ref 0–44)
AST: 22 U/L (ref 15–41)
Albumin: 2.7 g/dL — ABNORMAL LOW (ref 3.5–5.0)
Alkaline Phosphatase: 120 U/L (ref 38–126)
Anion gap: 9 (ref 5–15)
BUN: 10 mg/dL (ref 8–23)
CO2: 23 mmol/L (ref 22–32)
Calcium: 8.7 mg/dL — ABNORMAL LOW (ref 8.9–10.3)
Chloride: 100 mmol/L (ref 98–111)
Creatinine, Ser: 0.86 mg/dL (ref 0.44–1.00)
GFR, Estimated: >60 mL/min (ref >60)
Glucose, Bld: 96 mg/dL (ref 70–99)
Potassium: 4 mmol/L (ref 3.5–5.1)
Sodium: 132 mmol/L — ABNORMAL LOW (ref 135–145)
Total Bilirubin: 1.1 mg/dL (ref 0.3–1.2)
Total Protein: 5.4 g/dL — ABNORMAL LOW (ref 6.5–8.1)

## 2020-10-22 LAB — CBC
HCT: 35 % — ABNORMAL LOW (ref 36.0–46.0)
Hemoglobin: 11.6 g/dL — ABNORMAL LOW (ref 12.0–15.0)
MCH: 31.9 pg (ref 26.0–34.0)
MCHC: 33.1 g/dL (ref 30.0–36.0)
MCV: 96.2 fL (ref 80.0–100.0)
Platelets: 383 10*3/uL (ref 150–400)
RBC: 3.64 MIL/uL — ABNORMAL LOW (ref 3.87–5.11)
RDW: 13.5 % (ref 11.5–15.5)
WBC: 14 10*3/uL — ABNORMAL HIGH (ref 4.0–10.5)
nRBC: 0 % (ref 0.0–0.2)

## 2020-10-22 LAB — LIPASE, BLOOD: Lipase: 60 U/L — ABNORMAL HIGH (ref 11–51)

## 2020-10-22 MED ORDER — BOOST / RESOURCE BREEZE PO LIQD CUSTOM
1.0000 | Freq: Three times a day (TID) | ORAL | Status: DC
  Administered 2020-10-22 – 2020-10-23 (×3): 1 via ORAL

## 2020-10-22 NOTE — Progress Notes (Signed)
1 Day Post-Op  Subjective: Having a lot of pain today everywhere.  It started later last night after her ERCP.  It is across the upper part of her abdomen.  She also has pain in her left shoulder as well.  Unsure if this is from positioning or diaphragmatic irritation.  No nausea   ROS: See above, otherwise other systems negative  Objective: Vital signs in last 24 hours: Temp:  [97.4 F (36.3 C)-98.2 F (36.8 C)] 98 F (36.7 C) (03/17 0535) Pulse Rate:  [56-66] 56 (03/17 0535) Resp:  [8-18] 16 (03/17 0535) BP: (129-182)/(56-83) 129/56 (03/17 0535) SpO2:  [91 %-100 %] 96 % (03/17 0535) Last BM Date: 10/21/20  Intake/Output from previous day: 03/16 0701 - 03/17 0700 In: 1200 [I.V.:1000; IV Piggyback:200] Out: 540 [Urine:200; Drains:340] Intake/Output this shift: Total I/O In: 0  Out: 275 [Urine:250; Drains:25]  PE: Abd: soft, tender in upper abdomen, no peritonitis, few BS, incisions c/d/i.  JP drain with bilious output.   Lab Results:  Recent Labs    10/21/20 0437 10/22/20 0944  WBC 6.7 14.0*  HGB 11.0* 11.6*  HCT 33.2* 35.0*  PLT 365 383   BMET Recent Labs    10/20/20 0520 10/21/20 0437  NA 135 135  K 3.4* 4.3  CL 101 105  CO2 24 24  GLUCOSE 95 92  BUN 6* 6*  CREATININE 0.75 0.80  CALCIUM 8.7* 8.7*   PT/INR No results for input(s): LABPROT, INR in the last 72 hours. CMP     Component Value Date/Time   NA 135 10/21/2020 0437   NA 137 05/19/2020 1455   NA 137 02/10/2017 0954   K 4.3 10/21/2020 0437   K 3.9 02/10/2017 0954   CL 105 10/21/2020 0437   CO2 24 10/21/2020 0437   CO2 23 02/10/2017 0954   GLUCOSE 92 10/21/2020 0437   GLUCOSE 87 02/10/2017 0954   BUN 6 (L) 10/21/2020 0437   BUN 11 05/19/2020 1455   BUN 14.8 02/10/2017 0954   CREATININE 0.80 10/21/2020 0437   CREATININE 1.0 02/10/2017 0954   CALCIUM 8.7 (L) 10/21/2020 0437   CALCIUM 10.1 02/10/2017 0954   PROT 5.3 (L) 10/21/2020 0437   PROT 7.0 02/10/2017 0954   PROT 6.4  02/10/2017 0953   ALBUMIN 2.6 (L) 10/21/2020 0437   ALBUMIN 3.7 02/10/2017 0954   AST 23 10/21/2020 0437   AST 21 02/10/2017 0954   ALT 34 10/21/2020 0437   ALT 14 02/10/2017 0954   ALKPHOS 132 (H) 10/21/2020 0437   ALKPHOS 115 02/10/2017 0954   BILITOT 1.0 10/21/2020 0437   BILITOT 0.66 02/10/2017 0954   GFRNONAA >60 10/21/2020 0437   GFRAA 61 05/19/2020 1455   Lipase     Component Value Date/Time   LIPASE 23 10/18/2020 0558       Studies/Results: DG ERCP With Sphincterotomy  Result Date: 10/21/2020 CLINICAL DATA:  Postoperative bile leak. EXAM: ERCP TECHNIQUE: Multiple spot images obtained with the fluoroscopic device and submitted for interpretation post-procedure. FLUOROSCOPY TIME:  Fluoroscopy Time:  7 minutes, 8 seconds Radiation Exposure Index (if provided by the fluoroscopic device): 134.77 mGy COMPARISON:  Nuclear medicine hepatobiliary examination 10/19/2020. CT 10/13/2020 FINDINGS: Main pancreatic duct was cannulated with a wire and a small pancreatic stent was placed. The common bile duct was cannulated with a wire and retrograde cholangiogram was performed. No significant dilatation of the extrahepatic biliary system. Cystic duct is patent. Small area of pooling contrast near the cystic duct could  represent the bile leak. However, there is gas in this area on the later images so it is not clear if this is related to the bile leak or possibly contrast within the duodenum. A nonmetallic biliary stent was placed. IMPRESSION: 1. Placement biliary and pancreatic stents. 2. Retrograde cholangiogram demonstrates a non dilated biliary system. Questionable bile leak near the cystic duct. These images were submitted for radiologic interpretation only. Please see the procedural report for the amount of contrast and the fluoroscopy time utilized. Electronically Signed   By: Markus Daft M.D.   On: 10/21/2020 16:39    Anti-infectives: Anti-infectives (From admission, onward)   Start      Dose/Rate Route Frequency Ordered Stop   10/21/20 1030  ciprofloxacin (CIPRO) IVPB 400 mg        400 mg 200 mL/hr over 60 Minutes Intravenous 60 min pre-op 10/21/20 0048 10/21/20 1221   10/17/20 1200  piperacillin-tazobactam (ZOSYN) IVPB 3.375 g        3.375 g 12.5 mL/hr over 240 Minutes Intravenous Every 8 hours 10/17/20 1041 10/22/20 1159   10/17/20 1000  cefTRIAXone (ROCEPHIN) 2 g in sodium chloride 0.9 % 100 mL IVPB  Status:  Discontinued        2 g 200 mL/hr over 30 Minutes Intravenous Every 24 hours 10/16/20 1639 10/17/20 1041   10/15/20 1200  cefTRIAXone (ROCEPHIN) 2 g in sodium chloride 0.9 % 100 mL IVPB  Status:  Discontinued        2 g 200 mL/hr over 30 Minutes Intravenous Every 24 hours 10/15/20 0948 10/16/20 1639       Assessment/Plan Atrial fibrillation on chronic anticoagulation last dose Eliquis p.m. 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 Hassell Done 3/11, for cholecystitis, s/p perc chole drain placement and dislodgement without being able to be replaced -+ bile leak not unexpected -ERCP yesterday with stent placement in CBD and pancreatic duct. -lipase, CBC, CMET, given new abdominal pain to rule out post ERCP pancreatitis.   -plain film to rule out free air as source of diaphragmatic irritation causing shoulder pain, vs could be secondary to positioning. -cont ABX therapy x5 days post op -pulm toilet, mobilize,IS  FEN: CLD, but may need to make NPO pending labs ID: [email or web address] DVT: Eliquis last dose p.m. 10/12/2020/SCD, ok with lovenox from surgery standpoint  ADDENDUM: lipase is only 60.  That's not an unexpected increase s/p  procedure yesterday.  Will await plain films.  Follow.   LOS: 8 days    Tracey Mcclure , [facility] 10/22/2020, 10:17 AM Please see Amion for pager number during day hours 7:00am-4:30pm or 7:00am -11:30am on weekends

## 2020-10-22 NOTE — Progress Notes (Signed)
Subjective: Vague abdominal and shoulder pain last night several hours after ERCP. Feels somewhat better today.  Objective: Vital signs in last 24 hours: Temp:  [97.5 F (36.4 C)-98 F (36.7 C)] 97.5 F (36.4 C) (03/17 1316) Pulse Rate:  [56-66] 63 (03/17 1316) Resp:  [16-17] 17 (03/17 1316) BP: (129-147)/(56-83) 147/68 (03/17 1316) SpO2:  [91 %-98 %] 98 % (03/17 1316) Weight change:  Last BM Date: 10/21/20  PE: GEN:  NAD ABD:  JP drain putting out less bile; mild generalized tenderness (non-focal) without peritonitis  Lab Results: CBC    Component Value Date/Time   WBC 14.0 (H) 10/22/2020 0944   RBC 3.64 (L) 10/22/2020 0944   HGB 11.6 (L) 10/22/2020 0944   HGB 13.8 02/10/2017 0953   HCT 35.0 (L) 10/22/2020 0944   HCT 40.1 02/10/2017 0953   PLT 383 10/22/2020 0944   PLT 266 02/10/2017 0953   MCV 96.2 10/22/2020 0944   MCV 94.1 02/10/2017 0953   MCH 31.9 10/22/2020 0944   MCHC 33.1 10/22/2020 0944   RDW 13.5 10/22/2020 0944   RDW 12.9 02/10/2017 0953   LYMPHSABS 1.6 10/21/2020 0437   LYMPHSABS 3.0 02/10/2017 0953   MONOABS 0.5 10/21/2020 0437   MONOABS 0.5 02/10/2017 0953   EOSABS 0.4 10/21/2020 0437   EOSABS 0.2 02/10/2017 0953   BASOSABS 0.1 10/21/2020 0437   BASOSABS 0.0 02/10/2017 0953   CMP     Component Value Date/Time   NA 132 (L) 10/22/2020 0944   NA 137 05/19/2020 1455   NA 137 02/10/2017 0954   K 4.0 10/22/2020 0944   K 3.9 02/10/2017 0954   CL 100 10/22/2020 0944   CO2 23 10/22/2020 0944   CO2 23 02/10/2017 0954   GLUCOSE 96 10/22/2020 0944   GLUCOSE 87 02/10/2017 0954   BUN 10 10/22/2020 0944   BUN 11 05/19/2020 1455   BUN 14.8 02/10/2017 0954   CREATININE 0.86 10/22/2020 0944   CREATININE 1.0 02/10/2017 0954   CALCIUM 8.7 (L) 10/22/2020 0944   CALCIUM 10.1 02/10/2017 0954   PROT 5.4 (L) 10/22/2020 0944   PROT 7.0 02/10/2017 0954   PROT 6.4 02/10/2017 0953   ALBUMIN 2.7 (L) 10/22/2020 0944   ALBUMIN 3.7 02/10/2017 0954   AST 22  10/22/2020 0944   AST 21 02/10/2017 0954   ALT 29 10/22/2020 0944   ALT 14 02/10/2017 0954   ALKPHOS 120 10/22/2020 0944   ALKPHOS 115 02/10/2017 0954   BILITOT 1.1 10/22/2020 0944   BILITOT 0.66 02/10/2017 0954   GFRNONAA >60 10/22/2020 0944   GFRAA 61 05/19/2020 1455   Assessment:  1.  Bile leak s/p ERCP with biliary sphincterotomy, biliary stent placement, pancreatic stent placement. 2.  Post-procedural pain.  Increase leukocytosis without fever; slightly elevated lipase.  Unclear etiology.  Xray (personally reviewed) no evidence of free air.  Plan:  1.  Supportive care. 2.  I have discussed with surgical team.  If patient looks better tomorrow, would likely continue supportive management, but if not improved (or worsens), then would consider repeat CT scan tomorrow. 3.  [facility] will follow.   Landry Dyke 10/22/2020, 3:22 PM   Cell [phone] If no answer or after 5 PM call [phone]

## 2020-10-22 NOTE — Progress Notes (Signed)
Initial Nutrition Assessment  INTERVENTION:   -Recommend new weight for pt (last recorded 3/8)  -Recommend if diet is unable to be advanced, consider TPN given poor PO and mainly NPO/CLD since admission.  -Boost Breeze po TID, each supplement provides 250 kcal and 9 grams of protein  -Can resume Ensure Surgery once diet is advanced  NUTRITION DIAGNOSIS:   Inadequate oral intake related to inability to eat as evidenced by NPO status (/clear liquid diet).  GOAL:   Patient will meet greater than or equal to 90% of their needs  MONITOR:   PO intake,Supplement acceptance,Labs,Weight trends,I & O's,Diet advancement  REASON FOR ASSESSMENT:   Consult Assessment of nutrition requirement/status  ASSESSMENT:   80 y.o. female with a history of permanent atrial fibrillation, sick sinus syndrome s/p pacemaker, HOCM, hypertension, MGUS, hypothyroidism. Patient presented secondary to abdominal pain, nausea, malaise in setting of acute on chronic cholecystitis recently treated with a percutaneous drain and antibiotics. Patient underwent laparoscopic cholecystectomy in addition to biliary/pancreatic duct stent placement for bile leak.  3/8: NPO/CLD 3/11: s/p lap chole, perc chole drain placement 3/12: Dysphagia 1 3/13: HH diet 3/14: NPO 3/15: NPO/CLD 3/16: s/p ERCP  Patient lying flat in bed with daughter at bedside. Pt having some pain which started following her ERCP yesterday. Pt has not been able to stay beyond a clear liquid diet.  Pt was eating normally prior to symptoms starting in January. Since initial drain placement in January, pt has had poor PO.  Currently on clear liquids, only sipping on water. Willing to try [employer]. States she did have an Ensure a few days ago when she was on a diet that allowed it.  Given that pt has had inconsistent diet and intakes, would recommend TPN if unable to have diet advanced soon.  Per weight records, pt has lost 9 lbs since 05/06/20 (5%  wt loss x 6 months, insignificant for time frame).   Medications: Fibercon, Zofran  Labs reviewed: Low Na  NUTRITION - FOCUSED PHYSICAL EXAM:  No depletions noted.  Diet Order:   Diet Order            Diet clear liquid Room service appropriate? Yes; Fluid consistency: Thin  Diet effective now                 EDUCATION NEEDS:   Education needs have been addressed  Skin:  Skin Assessment: Skin Integrity Issues: Skin Integrity Issues:: Incisions Incisions: 3/11 abdomen  Last BM:  3/17 -type 6  Height:   Ht Readings from Last 1 Encounters:  10/13/20 5\' 4"  (1.626 m)    Weight:   Wt Readings from Last 1 Encounters:  10/13/20 76.2 kg   BMI:  Body mass index is 28.84 kg/m.  Estimated Nutritional Needs:   Kcal:  1700-1900  Protein:  85-95g  Fluid:  1.7L/day  Clayton Bibles, MS, RD, LDN Inpatient Clinical Dietitian Contact information available via Amion

## 2020-10-22 NOTE — Progress Notes (Signed)
PT Cancellation Note  Patient Details Name: Tracey Mcclure MRN: [id number] DOB: [birth year]   Cancelled Treatment:    Reason Eval/Treat Not Completed: Pain limiting ability to participate (pt reported her abdomen is hurting due to recent walk to the bathroom and recent xray. She doesn't feel up to further activity at present. Will follow.)   Philomena Doheny PT 10/22/2020  Acute Rehabilitation Services Pager [phone] Office [phone]

## 2020-10-22 NOTE — Progress Notes (Signed)
PROGRESS NOTE    Tracey Mcclure  ZOX:[id number] DOB: 27-Nov-1940 DOA: 10/13/2020 PCP: Crist Infante, MD   Brief Narrative: Tracey Mcclure is a 80 y.o. female with a history of permanent atrial fibrillation, sick sinus syndrome s/p pacemaker, HOCM, hypertension, MGUS, hypothyroidism. Patient presented secondary to abdominal pain, nausea, malaise in setting of acute on chronic cholecystitis recently treated with a percutaneous drain and antibiotics. Patient underwent laparoscopic cholecystectomy in addition to biliary/pancreatic duct stent placement for bile leak.   Assessment & Plan:   Principal Problem:   Acute calculous cholecystitis s/p cholecystectomy with oversew cystic duct 10/16/2020 Active Problems:   Hypertrophic obstructive cardiomyopathy (HCC)   HTN (hypertension)   Hypothyroidism   Obesity   MGUS (monoclonal gammopathy of unknown significance)   Atrial fibrillation (HCC)   Pacemaker   Hypokalemia   Hyponatremia   Transaminitis   Choledocholithiasis   Memory difficulty   Chronic anticoagulation  Bile leak Acute on chronic cholecystitis Repeat drain placed on admission. General surgery consulted and patient underwent laparoscopic cholecystecomy on 3/11; attempted intraoperative cholangiogram resulted in extravasation. GI consulted and performed ERCP on 3/16 with biliary sphincterotomy performed in addition to bile duct and ventral pancreatic duct plastic stent placement. Zosyn IV x5 days prescribed. New abdominal pain this morning -General surgery recommendations: Lipase, abdominal x-ray -GI recommendations: CLD, avoid aspirin/NSAIDs x3 days, repeat EGD/ERCP in 6-8 weeks for stent removal/repeat cholangiogram  Hyponatremia Mild. Resolved.  Hypokalemia Mild. Resolved.  Hypomagnesemia Mild. Resolved.  Hypothyroidism -Continue Synthroid 125 mcg daily  Permanent atrial fibrillation History of sick sinus syndrome Rate controlled. CHA2DS2-VASc Score is 4. History  of pacer placement. Patient is on Eliquis as an outpatient. Not on rate or rhythm control medication. Eliquis held secondary to inpatient procedures -Resume anticoagulation per GI recommendations. Okay to resume per general surgery  Primary hypertension Patient is on hydrochlorothiazide as an outpatient.  Hyperlipidemia Patient is on Lipitor 40 mg daily which was held on admission -Continue Lipitor  Thrombocytosis Transient. Resolved.  Hypoalbuminemia -Dietitian consult  MGUS Inactive.  HOCM Noted.   DVT prophylaxis: SCDs Code Status:   Code Status: DNR Family Communication: Daughterat bedside Disposition Plan: Discharge likely in 2-3 hours pending GI and general surgery recommendations   Consultants:   General surgery  Gastroenterology [facility])  Procedures:   LAPAROSCOPIC CHOLECYSTECTOMY and PERIOPERATIVE CHOLANGIOGRAM (10/16/2020)  ERCP (10/21/2020) Impression:               - The patient has had a cholecystectomy.                           - Bile leak, cystic duct stump, treated with                            biliary sphincterotomy and bile duct stent                            placement.                           - One plastic stent was placed into the ventral                            pancreatic duct.                           -  One plastic stent was placed into the common bile                            duct. Recommendation:           - Avoid aspirin and nonsteroidal anti-inflammatory                            medicines for 3 days.                           - Clear liquid diet today.                           - Continue present medications.                           [facility] will follow.                           - Will need repeat EGD/ERCP for stent removal and                            repeat cholangiogram in 6-8 weeks.  Antimicrobials:  Zosyn IV    Subjective: Significant pain this morning in R/LUQs bilaterally.  Objective: Vitals:    10/21/20 1437 10/21/20 2051 10/22/20 0051 10/22/20 0535  BP: (!) 158/74 (!) 143/83 (!) 143/72 (!) 129/56  Pulse: 61 66 60 (!) 56  Resp:  16 17 16   Temp: (!) 97.4 F (36.3 C) 97.6 F (36.4 C) (!) 97.5 F (36.4 C) 98 F (36.7 C)  TempSrc: Oral Oral Oral   SpO2: 97% 91% 96% 96%  Weight:      Height:        Intake/Output Summary (Last 24 hours) at 10/22/2020 1210 Last data filed at 10/22/2020 0914 Gross per 24 hour  Intake 1000 ml  Output 565 ml  Net 435 ml   Filed Weights   10/13/20 0346  Weight: 76.2 kg    Examination:  General exam: Appears calm and comfortable Respiratory system: Clear to auscultation. Respiratory effort normal. Cardiovascular system: S1 & S2 heard, RRR. Systolic murmur Gastrointestinal system: Abdomen is nondistended, soft and tender in upper quadrants. No organomegaly or masses felt. Normal bowel sounds heard. Central nervous system: Alert and oriented. No focal neurological deficits. Musculoskeletal: No calf tenderness Skin: No cyanosis. No rashes Psychiatry: Judgement and insight appear normal. Mood & affect appropriate.     Data Reviewed: I have personally reviewed following labs and imaging studies  CBC Lab Results  Component Value Date   WBC 14.0 (H) 10/22/2020   RBC 3.64 (L) 10/22/2020   HGB 11.6 (L) 10/22/2020   HCT 35.0 (L) 10/22/2020   MCV 96.2 10/22/2020   MCH 31.9 10/22/2020   PLT 383 10/22/2020   MCHC 33.1 10/22/2020   RDW 13.5 10/22/2020   LYMPHSABS 1.6 10/21/2020   MONOABS 0.5 10/21/2020   EOSABS 0.4 10/21/2020   BASOSABS 0.1 70/26/3785     Last metabolic panel Lab Results  Component Value Date   NA 132 (L) 10/22/2020   K 4.0 10/22/2020   CL 100 10/22/2020   CO2 23 10/22/2020   BUN 10 10/22/2020   CREATININE 0.86 10/22/2020  GLUCOSE 96 10/22/2020   GFRNONAA >60 10/22/2020   GFRAA 61 05/19/2020   CALCIUM 8.7 (L) 10/22/2020   PHOS 3.5 10/21/2020   PROT 5.4 (L) 10/22/2020   ALBUMIN 2.7 (L) 10/22/2020   LABGLOB  3.0 02/16/2018   BILITOT 1.1 10/22/2020   ALKPHOS 120 10/22/2020   AST 22 10/22/2020   ALT 29 10/22/2020   ANIONGAP 9 10/22/2020    CBG (last 3)  No results for input(s): GLUCAP in the last 72 hours.   GFR: Estimated Creatinine Clearance: 53 mL/min (by C-G formula based on SCr of 0.86 mg/dL).  Coagulation Profile: No results for input(s): INR, PROTIME in the last 168 hours.  Recent Results (from the past 240 hour(s))  Blood Culture (routine x 2)     Status: None   Collection Time: 10/13/20  3:45 AM   Specimen: BLOOD  Result Value Ref Range Status   Specimen Description   Final    BLOOD LEFT ANTECUBITAL Performed at [facility] [address] [place], [place] [address]    Special Requests   Final    BOTTLES DRAWN AEROBIC AND ANAEROBIC Blood Culture adequate volume Performed at [facility] [address] [place], [place] [address]    Culture   Final    NO GROWTH 5 DAYS Performed at [facility], [place] [address], [place], [place] [address]    Report Status 10/18/2020 FINAL  Final  Blood Culture (routine x 2)     Status: None   Collection Time: 10/13/20  3:45 AM   Specimen: BLOOD  Result Value Ref Range Status   Specimen Description   Final    BLOOD BLOOD LEFT FOREARM Performed at [facility] [address], [place], [place] [address]    Special Requests   Final    BOTTLES DRAWN AEROBIC AND ANAEROBIC Blood Culture adequate volume Performed at [facility] [address], [place], [place] [address]    Culture   Final    NO GROWTH 5 DAYS Performed at [facility], [place] [address], [place], [place] [address]    Report Status 10/18/2020 FINAL  Final  SARS CORONAVIRUS 2 (TAT 6-24 HRS) Nasopharyngeal Nasopharyngeal Swab     Status: None   Collection Time: 10/13/20  8:00 AM   Specimen: Nasopharyngeal Swab  Result Value Ref Range Status   SARS Coronavirus 2 NEGATIVE NEGATIVE  Final    Comment: (NOTE) SARS-CoV-2 target nucleic acids are NOT DETECTED.  The SARS-CoV-2 RNA is generally detectable in upper and lower respiratory specimens during the acute phase of infection. Negative results do not preclude SARS-CoV-2 infection, do not rule out co-infections with other pathogens, and should not be used as the sole basis for treatment or other patient management decisions. Negative results must be combined with clinical observations, patient history, and epidemiological information. The expected result is Negative.  Fact Sheet for Patients: SugarRoll.be  Fact Sheet for Healthcare Providers: https://www.woods-mathews.com/  This test is not yet approved or cleared by the [place] FDA and  has been authorized for detection and/or diagnosis of SARS-CoV-2 by FDA under an Emergency Use Authorization (EUA). This EUA will remain  in effect (meaning this test can be used) for the duration of the COVID-19 declaration under Se ction 564(b)(1) of the Act, 21 U.S.C. section 360bbb-3(b)(1), unless the authorization is terminated or revoked sooner.  Performed at [facility], [place] [address], [place],  [address]   MRSA  PCR Screening     Status: None   Collection Time: 10/15/20  6:01 AM   Specimen: Nasal Mucosa; Nasopharyngeal  Result Value Ref Range Status   MRSA by PCR NEGATIVE NEGATIVE Final    Comment:        The GeneXpert MRSA Assay (FDA approved for NASAL specimens only), is one component of a comprehensive MRSA colonization surveillance program. It is not intended to diagnose MRSA infection nor to guide or monitor treatment for MRSA infections. Performed at [facility], [place] [address], [place], [place] [address]         Radiology Studies: DG ERCP With Sphincterotomy  Result Date: 10/21/2020 CLINICAL DATA:  Postoperative bile leak. EXAM: ERCP TECHNIQUE: Multiple spot images  obtained with the fluoroscopic device and submitted for interpretation post-procedure. FLUOROSCOPY TIME:  Fluoroscopy Time:  7 minutes, 8 seconds Radiation Exposure Index (if provided by the fluoroscopic device): 134.77 mGy COMPARISON:  Nuclear medicine hepatobiliary examination 10/19/2020. CT 10/13/2020 FINDINGS: Main pancreatic duct was cannulated with a wire and a small pancreatic stent was placed. The common bile duct was cannulated with a wire and retrograde cholangiogram was performed. No significant dilatation of the extrahepatic biliary system. Cystic duct is patent. Small area of pooling contrast near the cystic duct could represent the bile leak. However, there is gas in this area on the later images so it is not clear if this is related to the bile leak or possibly contrast within the duodenum. A nonmetallic biliary stent was placed. IMPRESSION: 1. Placement biliary and pancreatic stents. 2. Retrograde cholangiogram demonstrates a non dilated biliary system. Questionable bile leak near the cystic duct. These images were submitted for radiologic interpretation only. Please see the procedural report for the amount of contrast and the fluoroscopy time utilized. Electronically Signed   By: Markus Daft M.D.   On: 10/21/2020 16:39   DG Abd Portable 1V  Result Date: 10/22/2020 CLINICAL DATA:  Evaluate for free air.  Shoulder pain. EXAM: PORTABLE ABDOMEN - 1 VIEW COMPARISON:  Abdominal CT from 9 days prior FINDINGS: Drain/stents in place. No free air seen under the diaphragm. The image was initially labeled is supine but is upright. The bowel gas pattern is normal where covered in the upper abdomen. Cardiomegaly. Two right ventricular pacer leads are present. Clear lung bases. IMPRESSION: No subdiaphragmatic pneumoperitoneum. Electronically Signed   By: Monte Fantasia M.D.   On: 10/22/2020 11:16        Scheduled Meds: . acetaminophen  1,000 mg Oral TID  . feeding supplement  237 mL Oral BID BM  .  levothyroxine  125 mcg Oral Once per day on Mon Tue Wed Thu Fri Sat  . levothyroxine  62.5 mcg Oral Q Sun  . lip balm  1 application Topical BID  . polycarbophil  625 mg Oral BID  . sodium chloride flush  3 mL Intravenous Q12H   Continuous Infusions: . sodium chloride    . methocarbamol (ROBAXIN) IV       LOS: 8 days     Cordelia Poche, MD [facility] 10/22/2020, 12:10 PM  If 7PM-7AM, please contact night-coverage [email or web address]

## 2020-10-22 NOTE — Anesthesia Postprocedure Evaluation (Signed)
Anesthesia Post Note  Patient: Tracey Mcclure  Procedure(s) Performed: ENDOSCOPIC RETROGRADE CHOLANGIOPANCREATOGRAPHY (ERCP) (N/A ) SPHINCTEROTOMY PANCREATIC STENT PLACEMENT BILIARY STENT PLACEMENT (N/A )     Patient location during evaluation: PACU Anesthesia Type: General Level of consciousness: awake and alert Pain management: pain level controlled Vital Signs Assessment: post-procedure vital signs reviewed and stable Respiratory status: spontaneous breathing, nonlabored ventilation, respiratory function stable and patient connected to nasal cannula oxygen Cardiovascular status: blood pressure returned to baseline and stable Postop Assessment: no apparent nausea or vomiting Anesthetic complications: no   No complications documented.  Last Vitals:  Vitals:   10/22/20 0535 10/22/20 1316  BP: (!) 129/56 (!) 147/68  Pulse: (!) 56 63  Resp: 16 17  Temp: 36.7 C (!) 36.4 C  SpO2: 96% 98%    Last Pain:  Vitals:   10/22/20 1326  TempSrc:   PainSc: 5                  Wael Maestas

## 2020-10-23 ENCOUNTER — Inpatient Hospital Stay (HOSPITAL_COMMUNITY): Payer: Medicare Other

## 2020-10-23 LAB — CBC
HCT: 31.3 % — ABNORMAL LOW (ref 36.0–46.0)
Hemoglobin: 10.6 g/dL — ABNORMAL LOW (ref 12.0–15.0)
MCH: 32.1 pg (ref 26.0–34.0)
MCHC: 33.9 g/dL (ref 30.0–36.0)
MCV: 94.8 fL (ref 80.0–100.0)
Platelets: 356 10*3/uL (ref 150–400)
RBC: 3.3 MIL/uL — ABNORMAL LOW (ref 3.87–5.11)
RDW: 13.7 % (ref 11.5–15.5)
WBC: 8 10*3/uL (ref 4.0–10.5)
nRBC: 0 % (ref 0.0–0.2)

## 2020-10-23 LAB — LIPASE, BLOOD: Lipase: 53 U/L — ABNORMAL HIGH (ref 11–51)

## 2020-10-23 MED ORDER — APIXABAN 5 MG PO TABS: 5.0000 mg | ORAL_TABLET | Freq: Two times a day (BID) | ORAL | Status: DC

## 2020-10-23 MED ORDER — OXYCODONE HCL 5 MG PO TABS
2.5000 mg | ORAL_TABLET | ORAL | Status: DC | PRN
  Filled 2020-10-23: qty 1

## 2020-10-23 MED ORDER — CYCLOBENZAPRINE HCL 5 MG PO TABS: 5.0000 mg | ORAL_TABLET | Freq: Three times a day (TID) | ORAL | Status: DC | PRN

## 2020-10-23 MED ORDER — ENSURE SURGERY PO LIQD: 237.0000 mL | Freq: Two times a day (BID) | ORAL | Status: AC

## 2020-10-23 MED ORDER — TRAMADOL HCL 50 MG PO TABS
50.0000 mg | ORAL_TABLET | Freq: Four times a day (QID) | ORAL | Status: DC | PRN
Start: 2020-10-23 — End: 2020-10-24

## 2020-10-23 NOTE — Discharge Summary (Addendum)
Physician Discharge Summary  Tracey Mcclure:[id number] DOB: 05/10/1941 DOA: 10/13/2020  PCP: Crist Infante, MD  Admit date: 10/13/2020 Discharge date: 10/24/2020  Admitted From: Home Disposition: Home  Recommendations for Outpatient Follow-up:  1. Follow up with PCP in 1 week 2. Follow up with General surgery in 3 weeks 3. Follow up with Gastroenterology in 6 weeks 4. Please obtain BMP/CBC in one week 5. Please follow up on the following pending results: None  Home Health: None Equipment/Devices: None  Discharge Condition: Stable CODE STATUS: DNR Diet recommendation: Heart healthy   Brief/Interim Summary:  Admission HPI written by Edwin Dada, MD   HPI: Tracey Mcclure is a 80 y.o. F with hx perm A. Fib and SSS with pacer, HOCM, HTN, MGUS, hypothyroidism and recent cholecystitis who presented with epigastric pain and malaise.  Patient had been admitted 6 weeks ago with cholecystitis, treated with antibiotics and had perc drain placed.  Was doing well, active and eating a normal diet, until day before admission, went to IR clinic for drain evaluation.  A cholangiogram was obtained, and it was determined that the exsting tube was malpositioned, but in repositioning it, access was lost and attempts to replace the tube failed.  Within a few hours of this procedure, the patient was at home, started to have epigastric pain, nausea and subjective fevers.  This persisted and were severe, so she came to the ER.   Hospital course:  Bile leak Acute on chronic cholecystitis Repeat drain placed on admission. General surgery consulted and patient underwent laparoscopic cholecystecomy on 3/11; attempted intraoperative cholangiogram resulted in extravasation. GI consulted and performed ERCP on 3/16 with biliary sphincterotomy performed in addition to bile duct and ventral pancreatic duct plastic stent placement. Zosyn IV x5 days prescribed. New abdominal pain the day after  ERCP. Abdominal x-ray was negative for free air and lipase was only mildly elevated. On day of discharge, pain improved, lipase was trending down and patient's diet was successfully advanced. Patient discharged home with percutaneous drain with no need for home health services. Patient to follow-up with general surgery and gastroenterology as an outpatient.  Hyponatremia Mild. Resolved.  Hypokalemia Mild. Resolved.  Hypomagnesemia Mild. Resolved.  Hypothyroidism Continue Synthroid 125 mcg daily and 62.5 mcg on Sunday  Permanent atrial fibrillation History of sick sinus syndrome Rate controlled. CHA2DS2-VASc Scoreis 4. History of pacer placement. Patient is on Eliquis as an outpatient. Not on rate or rhythm control medication. Eliquis held secondary to inpatient procedures. Resume Eliquis in two days, 10/25/20.  Primary hypertension Patient is on hydrochlorothiazide as an outpatient. Resume.  Hyperlipidemia Patient is on Lipitor 40 mg daily which was held on admission. Continue Lipitor  Thrombocytosis Transient. Resolved.  Hypoalbuminemia Dietitian consulted.   MGUS Inactive.  HOCM Noted.  Left shoulder pain Likely musculoskeletal. Managed with Tylenol and heating pad. Some cervical arthritis noticed on cervical x-ray. Recommend continued Tylenol, ambulation, etc.   Discharge Diagnoses:  Principal Problem:   Acute calculous cholecystitis s/p cholecystectomy with oversew cystic duct 10/16/2020 Active Problems:   Hypertrophic obstructive cardiomyopathy (HCC)   HTN (hypertension)   Hypothyroidism   Obesity   MGUS (monoclonal gammopathy of unknown significance)   Atrial fibrillation (HCC)   Pacemaker   Hypokalemia   Hyponatremia   Transaminitis   Choledocholithiasis   Memory difficulty   Chronic anticoagulation    Discharge Instructions   Allergies as of 10/23/2020      Reactions   Dilaudid [hydromorphone Hcl] Nausea And Vomiting  PT states she  has uncontrolled nausea and vomiting    Azithromycin Other (See Comments)   Patient states that she had severe stomach cramps for a solid 4 hours.       Medication List    TAKE these medications   acetaminophen 325 MG tablet Commonly known as: TYLENOL Take 650 mg by mouth every 6 (six) hours as needed for mild pain or headache.   apixaban 5 MG Tabs tablet Commonly known as: Eliquis Take 1 tablet (5 mg total) by mouth 2 (two) times daily. Start taking on: October 25, 2020 What changed:   how much to take  These instructions start on October 25, 2020. If you are unsure what to do until then, ask your doctor or other care provider.   atorvastatin 40 MG tablet Commonly known as: LIPITOR TAKE 1 TABLET BY MOUTH EVERYDAY AT BEDTIME What changed: See the new instructions.   cyanocobalamin 1000 MCG tablet Take 1,000 mcg by mouth daily.   feeding supplement Liqd Take 237 mLs by mouth 2 (two) times daily between meals.   hydrochlorothiazide 25 MG tablet Commonly known as: HYDRODIURIL Take 0.5 tablets (12.5 mg total) by mouth daily.   levothyroxine 125 MCG tablet Commonly known as: SYNTHROID Take 62.5-125 mcg by mouth See admin instructions. 127mcg daily EXCEPT 62.32mcg on sunday   ondansetron 4 MG tablet Commonly known as: ZOFRAN Take 1 tablet (4 mg total) by mouth every 6 (six) hours as needed for nausea.   PROBIOTIC DAILY PO Take 1 tablet by mouth 2 (two) times daily.   Vitamin D 50 MCG (2000 UT) Caps Take 2,000 Units by mouth daily.       Follow-up Information    Coralie Keens, MD Follow up on 11/11/2020.   Specialty: General Surgery Why: This appointment is with DR. MARTIN ( i just couldn't get the correct address under his name.  it is NOT with Dr. Ninfa Linden)  10:00am, arrive by 9:30am for [facility] and check in process. Contact information: [place] [address] [phone]        Arta Silence, MD. Schedule an appointment as soon  as possible for a visit in 6 week(s).   Specialty: Gastroenterology Why: Biliary/pancreatic duct stents Contact information: [address] [place] [address] [phone]        Crist Infante, MD. Schedule an appointment as soon as possible for a visit in 1 week(s).   Specialty: Internal Medicine Why: Hospital follow-up Contact information: [place] [address] [phone]        Martinique, Peter M, MD .   Specialty: Cardiology Contact information: [address] [place] [address] [phone]              Allergies  Allergen Reactions  . Dilaudid [Hydromorphone Hcl] Nausea And Vomiting    PT states she has uncontrolled nausea and vomiting   . Azithromycin Other (See Comments)    Patient states that she had severe stomach cramps for a solid 4 hours.     Consultations:  Gastroenterology  General surgery   Procedures/Studies: DG Cholangiogram Operative  Result Date: 10/16/2020 CLINICAL DATA:  Attempted cholangiogram EXAM: INTRAOPERATIVE CHOLANGIOGRAM TECHNIQUE: Cholangiographic images from the C-arm fluoroscopic device were submitted for interpretation post-operatively. Please see the procedural report for the amount of contrast and the fluoroscopy time utilized. COMPARISON:  October 13, 2020 FINDINGS: Injection of contrast did not opacify the biliary tree. Only extraluminal contrast was visualized. The  biliary tree cannot be adequately assessed on this exam. One cine clip was submitted for evaluation. The total fluoroscopy time was 18 seconds. The total dose was 5.04 mGy. IMPRESSION: Nondiagnostic cholangiogram. Electronically Signed   By: Constance Holster M.D.   On: 10/16/2020 14:26   CT ABDOMEN PELVIS W CONTRAST  Result Date: 10/13/2020 CLINICAL DATA:  Dislodged cholecystostomy catheter, nausea, vomiting, right upper quadrant abdominal pain EXAM: CT ABDOMEN AND PELVIS WITH CONTRAST TECHNIQUE: Multidetector CT  imaging of the abdomen and pelvis was performed using the standard protocol following bolus administration of intravenous contrast. CONTRAST:  118mL OMNIPAQUE IOHEXOL 300 MG/ML  SOLN COMPARISON:  09/05/2020 FINDINGS: Lower chest: The visualized lung bases are clear bilaterally. Cardiac size is mildly enlarged. There is bulbous dilation of the left ventricular apex suggestive of an aneurysm, not well assessed on this exam. Pacemaker leads are seen within the a right ventricle. Hepatobiliary: Cholelithiasis. The gallbladder wall is circumferentially thickened and there is moderate pericholecystic inflammatory change identified. Trace amount of pericholecystic fluid seen. Punctate foci of gas within the gallbladder lumen are in keeping with the given history of recent catheterization. A a small catheter tract is seen within the right upper quadrant anterior abdominal wall related to the previously placed cholecystostomy catheter. No loculated perihepatic fluid collection identified. Inflammatory changes extend into the sub attic space and abut the hepatic flexure of the colon. The liver is unremarkable. No intra or extrahepatic biliary ductal dilation. Pancreas: Unremarkable Spleen: Unremarkable Adrenals/Urinary Tract: The adrenal glands are unremarkable. The kidneys are normal. The bladder is unremarkable. Stomach/Bowel: Scattered descending and sigmoid colonic diverticulosis. The stomach, small bowel, and large bowel are otherwise unremarkable. Appendix normal. No evidence of obstruction or focal inflammation. No free intraperitoneal gas or fluid. Vascular/Lymphatic: Extensive aortoiliac atherosclerotic calcification. No aortic aneurysm. Particularly prominent atherosclerotic calcifications seen at the origin of the celiac axis. No pathologic adenopathy within the abdomen and pelvis. Reproductive: Status post hysterectomy. No adnexal masses. Other: Small fat containing left inguinal hernia. Rectum unremarkable.  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 Degenerative changes are seen within the lumbar spine. No acute bone abnormality. IMPRESSION: Cholelithiasis. Extensive pericholecystic inflammatory change is nonspecific and may simply represent changes related to reported chronic catheterization. Trace pericholecystic fluid identified without significant drainable perihepatic fluid collection. Inflammatory changes extend into the right subhepatic space and abut the hepatic flexure of the colon. No intra or extrahepatic biliary ductal dilation identified. Aortic Atherosclerosis (ICD10-I70.0). Electronically Signed   By: Fidela Salisbury MD   On: 10/13/2020 05:56   DG Chest Port 1 View  Result Date: 10/13/2020 CLINICAL DATA:  Questionable sepsis EXAM: PORTABLE CHEST 1 VIEW COMPARISON:  Radiograph 09/02/2017, CT 06/03/2020 FINDINGS: No consolidation, features of edema, pneumothorax, or effusion. The aorta is calcified. Pacer pack overlies left chest wall with paired leads terminating at the cardiac apex in a configuration similar to prior counting for differences in positioning. Dense mitral annular calcifications are present. The remaining cardiomediastinal contours are unremarkable. No acute osseous or soft tissue abnormality. Degenerative changes are present in the imaged spine and shoulders. IMPRESSION: 1. No acute cardiopulmonary abnormality. 2. Calcification of the mitral annulus, consider outpatient assessment with echocardiography as warranted to assess for valvular dysfunction. 3.  Aortic Atherosclerosis (ICD10-I70.0). Electronically Signed   By: Lovena Le M.D.   On: 10/13/2020 03:21   DG ERCP With Sphincterotomy  Result Date: 10/21/2020 CLINICAL DATA:  Postoperative bile leak. EXAM: ERCP TECHNIQUE: Multiple spot images  obtained with the fluoroscopic device and submitted for interpretation post-procedure. FLUOROSCOPY TIME:  Fluoroscopy Time:  7 minutes,  8 seconds Radiation Exposure Index (if provided by the fluoroscopic device): 134.77 mGy COMPARISON:  Nuclear medicine hepatobiliary examination 10/19/2020. CT 10/13/2020 FINDINGS: Main pancreatic duct was cannulated with a wire and a small pancreatic stent was placed. The common bile duct was cannulated with a wire and retrograde cholangiogram was performed. No significant dilatation of the extrahepatic biliary system. Cystic duct is patent. Small area of pooling contrast near the cystic duct could represent the bile leak. However, there is gas in this area on the later images so it is not clear if this is related to the bile leak or possibly contrast within the duodenum. A nonmetallic biliary stent was placed. IMPRESSION: 1. Placement biliary and pancreatic stents. 2. Retrograde cholangiogram demonstrates a non dilated biliary system. Questionable bile leak near the cystic duct. These images were submitted for radiologic interpretation only. Please see the procedural report for the amount of contrast and the fluoroscopy time utilized. Electronically Signed   By: Markus Daft M.D.   On: 10/21/2020 16:39   DG Abd Portable 1V  Result Date: 10/22/2020 CLINICAL DATA:  Evaluate for free air.  Shoulder pain. EXAM: PORTABLE ABDOMEN - 1 VIEW COMPARISON:  Abdominal CT from 9 days prior FINDINGS: Drain/stents in place. No free air seen under the diaphragm. The image was initially labeled is supine but is upright. The bowel gas pattern is normal where covered in the upper abdomen. Cardiomegaly. Two right ventricular pacer leads are present. Clear lung bases. IMPRESSION: No subdiaphragmatic pneumoperitoneum. Electronically Signed   By: Monte Fantasia M.D.   On: 10/22/2020 11:16   IR CHOLANGIOGRAM EXISTING TUBE  Result Date: 10/13/2020 INDICATION: History of acute cholecystitis, status post percutaneous cholecystostomy catheter placement. EXAM: CHOLANGIOGRAM THROUGH EXISTING CATHETER EXCHANGE OF CHOLECYSTOSTOMY CATHETER  (ATTEMPTED) MEDICATIONS: None indicated ANESTHESIA/SEDATION: 1% lidocaine subcutaneous FLUOROSCOPY TIME:  13 minutes 54 seconds; 381 mGy COMPLICATIONS: None immediate. PROCEDURE: Initially, cholangiogram through the previously placed cholecystostomy catheter was performed. Informed written consent was obtained from the patient after a thorough discussion of the procedural risks, benefits and alternatives. All questions were addressed. Maximal Sterile Barrier Technique was utilized including caps, mask, sterile gowns, sterile gloves, sterile drape, hand hygiene and skin antiseptic. A timeout was performed prior to the initiation of the procedure. An angled glidewire was advanced through the catheter. The retention string was released and the catheter was withdrawn. Because of unusual catheter course, a 5 [place] Kumpe catheter was advanced over the wire in attempts to gain more central access to the gallbladder lumen. Access could not be achieved so the Kumpe the catheter and guidewire were removed. The patient tolerated the procedure well. FINDINGS: Cholangiogram shows decompressed gallbladder containing a large stone. There is no opacification of the cystic duct or common duct. A small amount of extravasation around the cholecystostomy catheter was noted at the end of the injection. Attempts to exchange and achieve more durable drain catheter positioning were unsuccessful and percutaneous access to gallbladder was lost. IMPRESSION: 1. Obstructing gallstone with non opacification cystic duct and biliary tree. 2. The malpositioned cholecystostomy catheter could not be repositioned within the gallbladder lumen and percutaneous access was lost. The findings were communicated to the patient, daughter, and general surgery whom the patient is scheduled to see in the office in 2 days. Electronically Signed   By: Lucrezia Europe M.D.   On: 10/13/2020 11:07   NM HEPATOBILIARY LEAK (POST-SURGICAL)  Result Date:  10/19/2020 CLINICAL DATA:  Laparoscopic cholecystectomy. Evaluate for biliary leak. Initial percutaneous cholecystostomy drain was placed. Patient has a JP drain following cholecystectomy. EXAM: NUCLEAR MEDICINE HEPATOBILIARY IMAGING TECHNIQUE: Sequential images of the abdomen were obtained out to 60 minutes following intravenous administration of radiopharmaceutical. RADIOPHARMACEUTICALS:  5.2 mCi Tc-63m  Choletec IV COMPARISON:  None. FINDINGS: Surgical drain clamped for imaging. Prompt clearance radiotracer from the blood pool and homogeneous uptake in the liver. Counts are evident in the common bile duct by 15 minutes. At 20 minutes, there is clear biliary excreted radiotracer along the inferior margin of the RIGHT hepatic lobe. This extra biliary collection increases in intensity up to 30 minutes extending along the RIGHT pericolic gutter. IMPRESSION: Brisk bile leak with biliary excreted radiotracer collecting along the inferior margin RIGHT hepatic lobe extending into the RIGHT pericolic gutter. These results will be called to the ordering clinician or representative by the Radiologist Assistant, and communication documented in the PACS or [employer]. Electronically Signed   By: Suzy Bouchard M.D.   On: 10/19/2020 16:14     LAPAROSCOPIC CHOLECYSTECTOMY and PERIOPERATIVE CHOLANGIOGRAM (10/16/2020)  ERCP (10/21/2020) Impression: - The patient has had a cholecystectomy. - Bile leak, cystic duct stump, treated with  biliary sphincterotomy and bile duct stent  placement. - One plastic stent was placed into the ventral  pancreatic duct. - One plastic stent was placed into the common bile  duct. Recommendation: - Avoid aspirin and nonsteroidal anti-inflammatory   medicines for 3 days. - Clear liquid diet today. - Continue present medications. [facility] will follow. - Will need repeat EGD/ERCP for stent removal and  repeat cholangiogram in 6-8 weeks.   Subjective: Pain is improved today no other concerns.  Discharge Exam: Vitals:   10/23/20 0458 10/23/20 1326  BP: (!) 144/73 (!) 158/80  Pulse: 72 79  Resp: 16 16  Temp: 97.7 F (36.5 C) 97.7 F (36.5 C)  SpO2: 96% 98%   Vitals:   10/22/20 1316 10/22/20 2118 10/23/20 0458 10/23/20 1326  BP: (!) 147/68 (!) 115/57 (!) 144/73 (!) 158/80  Pulse: 63 60 72 79  Resp: 17 16 16 16   Temp: (!) 97.5 F (36.4 C) 97.6 F (36.4 C) 97.7 F (36.5 C) 97.7 F (36.5 C)  TempSrc: Oral Oral Oral Oral  SpO2: 98% 97% 96% 98%  Weight:      Height:        General: Pt is alert, awake, not in acute distress Cardiovascular: RRR, S1/S2 +, no rubs, no gallops Respiratory: CTA bilaterally, no wheezing, no rhonchi Abdominal: Soft, NT, ND, bowel sounds + Extremities: no edema, no cyanosis    The results of significant diagnostics from this hospitalization (including imaging, microbiology, ancillary and laboratory) are listed below for reference.     Microbiology: Recent Results (from the past 240 hour(s))  MRSA PCR Screening     Status: None   Collection Time: 10/15/20  6:01 AM   Specimen: Nasal Mucosa; Nasopharyngeal  Result Value Ref Range Status   MRSA by PCR NEGATIVE NEGATIVE Final    Comment:        The GeneXpert MRSA Assay (FDA approved for NASAL specimens only), is one component of a comprehensive MRSA colonization surveillance program. It is not intended to diagnose MRSA infection nor to guide or monitor treatment for MRSA infections. Performed at [facility], [place] [address], [place], [place] [address]       Labs: BNP (last 3 results) No results for  input(s): BNP in the last 8760 hours. Basic Metabolic Panel: Recent Labs  Lab 10/17/20 0531 10/18/20 0558 10/19/20 0437 10/20/20 0520 10/21/20 0437 10/22/20 0944  NA 132* 131* 133* 135 135 132*  K 4.2 3.5 3.7 3.4* 4.3 4.0  CL 98 98 102 101 105 100  CO2 23 25 25 24 24 23   GLUCOSE 127* 92 96 95 92 96  BUN 8 8 6* 6* 6* 10  CREATININE 0.77 0.81 0.52 0.75 0.80 0.86  CALCIUM 9.1 8.6* 8.6* 8.7* 8.7* 8.7*  MG 1.7 1.7 1.7 2.0 1.7  --   PHOS 5.0* 3.4 3.4 3.5 3.5  --    Liver Function Tests: Recent Labs  Lab 10/18/20 0558 10/19/20 0437 10/20/20 0520 10/21/20 0437 10/22/20 0944  AST 45* 33 28 23 22   ALT 64* 50* 43 34 29  ALKPHOS 163* 126 146* 132* 120  BILITOT 1.0 1.0 0.9 1.0 1.1  PROT 5.6* 4.9* 5.4* 5.3* 5.4*  ALBUMIN 2.6* 2.4* 2.8* 2.6* 2.7*   Recent Labs  Lab 10/18/20 0558 10/22/20 0944 10/23/20 0438  LIPASE 23 60* 53*   No results for input(s): AMMONIA in the last 168 hours. CBC: Recent Labs  Lab 10/17/20 0531 10/18/20 0558 10/19/20 0437 10/20/20 0520 10/21/20 0437 10/22/20 0944 10/23/20 0438  WBC 9.5 7.9 6.5 6.1 6.7 14.0* 8.0  NEUTROABS 8.0* 5.1 4.1 3.2 4.1  --   --   HGB 12.6 10.2* 10.5* 11.1* 11.0* 11.6* 10.6*  HCT 37.3 30.2* 30.6* 32.9* 33.2* 35.0* 31.3*  MCV 94.7 94.7 92.7 94.5 96.5 96.2 94.8  PLT 422* 349 333 368 365 383 356   Cardiac Enzymes: No results for input(s): CKTOTAL, CKMB, CKMBINDEX, TROPONINI in the last 168 hours. BNP: Invalid input(s): POCBNP CBG: No results for input(s): GLUCAP in the last 168 hours. D-Dimer No results for input(s): DDIMER in the last 72 hours. Hgb A1c No results for input(s): HGBA1C in the last 72 hours. Lipid Profile No results for input(s): CHOL, HDL, LDLCALC, TRIG, CHOLHDL, LDLDIRECT in the last 72 hours. Thyroid function studies No results for input(s): TSH, T4TOTAL, T3FREE, THYROIDAB in the last 72 hours.  Invalid input(s): FREET3 Anemia work up No results  for input(s): VITAMINB12, FOLATE, FERRITIN, TIBC, IRON, RETICCTPCT in the last 72 hours. Urinalysis    Component Value Date/Time   COLORURINE YELLOW 10/13/2020 0345   APPEARANCEUR HAZY (A) 10/13/2020 0345   LABSPEC 1.010 10/13/2020 0345   PHURINE 6.0 10/13/2020 0345   GLUCOSEU NEGATIVE 10/13/2020 0345   HGBUR SMALL (A) 10/13/2020 0345   BILIRUBINUR NEGATIVE 10/13/2020 0345   KETONESUR NEGATIVE 10/13/2020 0345   PROTEINUR NEGATIVE 10/13/2020 0345   NITRITE NEGATIVE 10/13/2020 0345   LEUKOCYTESUR MODERATE (A) 10/13/2020 0345   Sepsis Labs Invalid input(s): PROCALCITONIN,  WBC,  LACTICIDVEN Microbiology Recent Results (from the past 240 hour(s))  MRSA PCR Screening     Status: None   Collection Time: 10/15/20  6:01 AM   Specimen: Nasal Mucosa; Nasopharyngeal  Result Value Ref Range Status   MRSA by PCR NEGATIVE NEGATIVE Final    Comment:        The GeneXpert MRSA Assay (FDA approved for NASAL specimens only), is one component of a comprehensive MRSA colonization surveillance program. It is not intended to diagnose MRSA infection nor to guide or monitor treatment for MRSA infections. Performed at [facility], [place] [address] [place], [place] [address]      Time coordinating discharge: 35 minutes  SIGNED:   Cordelia Poche, MD [facility] 10/23/2020, 1:38 PM

## 2020-10-23 NOTE — Progress Notes (Signed)
Patient with severe left shoulder pain and left rib pain. Unclear etiology but seems possibly related to positioning during ERCP. Pain is improving but still significant. Will obtain a neck/left shoulder x-ray. Continue analgesics prn. Will discontinue discharge order at this time.  Cordelia Poche, MD [facility] 10/23/2020, 3:30 PM

## 2020-10-23 NOTE — Care Management Important Message (Signed)
Important Message  Patient Details IM Letter given to the Patient. Name: [name] MRN: [id number] Date of Birth: 1940/08/12   Medicare Important Message Given:  Yes     Kerin Salen 10/23/2020, 11:22 AM

## 2020-10-23 NOTE — Progress Notes (Signed)
[facility] Progress Note  2 Days Post-Op  Subjective: CC-  Continues to have mostly RUQ abdominal pain, although it is improved from yesterday. Pain is worse with movement. Denies n/v. Tolerating liquids. PO intake does not worsen her pain. Asking for real food. Passing flatus. States that the muscle relaxer made her feel groggy yesterday. WBC 8, afebrile, lipase down to 53.  Objective: Vital signs in last 24 hours: Temp:  [97.5 F (36.4 C)-97.7 F (36.5 C)] 97.7 F (36.5 C) (03/18 0458) Pulse Rate:  [60-72] 72 (03/18 0458) Resp:  [16-17] 16 (03/18 0458) BP: (115-147)/(57-73) 144/73 (03/18 0458) SpO2:  [96 %-98 %] 96 % (03/18 0458) Last BM Date: 10/21/20  Intake/Output from previous day: 03/17 0701 - 03/18 0700 In: 360 [P.O.:360] Out: 570 [Urine:450; Drains:120] Intake/Output this shift: No intake/output data recorded.  PE: Gen:  Alert, NAD, pleasant Pulm:  rate and effort normal Abd: Soft, ND, TTP RUQ and epigastric region, no rebound or guarding, +BS, incisions cdi, JP drain golden bile  Lab Results:  Recent Labs    10/22/20 0944 10/23/20 0438  WBC 14.0* 8.0  HGB 11.6* 10.6*  HCT 35.0* 31.3*  PLT 383 356   BMET Recent Labs    10/21/20 0437 10/22/20 0944  NA 135 132*  K 4.3 4.0  CL 105 100  CO2 24 23  GLUCOSE 92 96  BUN 6* 10  CREATININE 0.80 0.86  CALCIUM 8.7* 8.7*   PT/INR No results for input(s): LABPROT, INR in the last 72 hours. CMP     Component Value Date/Time   NA 132 (L) 10/22/2020 0944   NA 137 05/19/2020 1455   NA 137 02/10/2017 0954   K 4.0 10/22/2020 0944   K 3.9 02/10/2017 0954   CL 100 10/22/2020 0944   CO2 23 10/22/2020 0944   CO2 23 02/10/2017 0954   GLUCOSE 96 10/22/2020 0944   GLUCOSE 87 02/10/2017 0954   BUN 10 10/22/2020 0944   BUN 11 05/19/2020 1455   BUN 14.8 02/10/2017 0954   CREATININE 0.86 10/22/2020 0944   CREATININE 1.0 02/10/2017 0954   CALCIUM 8.7 (L) 10/22/2020 0944   CALCIUM 10.1 02/10/2017  0954   PROT 5.4 (L) 10/22/2020 0944   PROT 7.0 02/10/2017 0954   PROT 6.4 02/10/2017 0953   ALBUMIN 2.7 (L) 10/22/2020 0944   ALBUMIN 3.7 02/10/2017 0954   AST 22 10/22/2020 0944   AST 21 02/10/2017 0954   ALT 29 10/22/2020 0944   ALT 14 02/10/2017 0954   ALKPHOS 120 10/22/2020 0944   ALKPHOS 115 02/10/2017 0954   BILITOT 1.1 10/22/2020 0944   BILITOT 0.66 02/10/2017 0954   GFRNONAA >60 10/22/2020 0944   GFRAA 61 05/19/2020 1455   Lipase     Component Value Date/Time   LIPASE 53 (H) 10/23/2020 0438       Studies/Results: DG ERCP With Sphincterotomy  Result Date: 10/21/2020 CLINICAL DATA:  Postoperative bile leak. EXAM: ERCP TECHNIQUE: Multiple spot images obtained with the fluoroscopic device and submitted for interpretation post-procedure. FLUOROSCOPY TIME:  Fluoroscopy Time:  7 minutes, 8 seconds Radiation Exposure Index (if provided by the fluoroscopic device): 134.77 mGy COMPARISON:  Nuclear medicine hepatobiliary examination 10/19/2020. CT 10/13/2020 FINDINGS: Main pancreatic duct was cannulated with a wire and a small pancreatic stent was placed. The common bile duct was cannulated with a wire and retrograde cholangiogram was performed. No significant dilatation of the extrahepatic biliary system. Cystic duct is patent. Small area of pooling contrast near the  cystic duct could represent the bile leak. However, there is gas in this area on the later images so it is not clear if this is related to the bile leak or possibly contrast within the duodenum. A nonmetallic biliary stent was placed. IMPRESSION: 1. Placement biliary and pancreatic stents. 2. Retrograde cholangiogram demonstrates a non dilated biliary system. Questionable bile leak near the cystic duct. These images were submitted for radiologic interpretation only. Please see the procedural report for the amount of contrast and the fluoroscopy time utilized. Electronically Signed   By: Markus Daft M.D.   On: 10/21/2020 16:39    DG Abd Portable 1V  Result Date: 10/22/2020 CLINICAL DATA:  Evaluate for free air.  Shoulder pain. EXAM: PORTABLE ABDOMEN - 1 VIEW COMPARISON:  Abdominal CT from 9 days prior FINDINGS: Drain/stents in place. No free air seen under the diaphragm. The image was initially labeled is supine but is upright. The bowel gas pattern is normal where covered in the upper abdomen. Cardiomegaly. Two right ventricular pacer leads are present. Clear lung bases. IMPRESSION: No subdiaphragmatic pneumoperitoneum. Electronically Signed   By: Monte Fantasia M.D.   On: 10/22/2020 11:16    Anti-infectives: Anti-infectives (From admission, onward)   Start     Dose/Rate Route Frequency Ordered Stop   10/21/20 1030  ciprofloxacin (CIPRO) IVPB 400 mg        400 mg 200 mL/hr over 60 Minutes Intravenous 60 min pre-op 10/21/20 0048 10/21/20 1221   10/17/20 1200  piperacillin-tazobactam (ZOSYN) IVPB 3.375 g        3.375 g 12.5 mL/hr over 240 Minutes Intravenous Every 8 hours 10/17/20 1041 10/22/20 1159   10/17/20 1000  cefTRIAXone (ROCEPHIN) 2 g in sodium chloride 0.9 % 100 mL IVPB  Status:  Discontinued        2 g 200 mL/hr over 30 Minutes Intravenous Every 24 hours 10/16/20 1639 10/17/20 1041   10/15/20 1200  cefTRIAXone (ROCEPHIN) 2 g in sodium chloride 0.9 % 100 mL IVPB  Status:  Discontinued        2 g 200 mL/hr over 30 Minutes Intravenous Every 24 hours 10/15/20 0948 10/16/20 1639       Assessment/Plan Atrial fibrillation on chronic anticoagulation last dose Eliquis p.m. 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 Hassell Done 3/11, for cholecystitis, s/p perc chole drain  placement and dislodgement without being able to be replaced -completed ABX therapy. WBC WNL, afebrile -s/p ERCP 3/16 with stent placement in CBD and pancreatic duct. -pulm toilet, mobilize,IS  FEN: HH diet ID: [email or web address] 3/12>>3/17 DVT: Eliquis last dose p.m. 10/12/2020. SCD, ok with lovenox from surgery standpoint  Plan: Continued pain but this is improving and lab work is reassuring. She is asking for a diet, will advance to [facility]. Change robaxin to flexeril. Continue tylenol. Alternate ice and heat. Mobilize. Home when pain improved.    LOS: 9 days    [facility] 10/23/2020, 8:33 AM Please see Amion for pager number during day hours 7:00am-4:30pm

## 2020-10-23 NOTE — Progress Notes (Signed)
Subjective: Feeling better. Less JP drain output.  Objective: Vital signs in last 24 hours: Temp:  [97.6 F (36.4 C)-97.7 F (36.5 C)] 97.7 F (36.5 C) (03/18 1326) Pulse Rate:  [60-79] 79 (03/18 1326) Resp:  [16] 16 (03/18 1326) BP: (115-158)/(57-80) 158/80 (03/18 1326) SpO2:  [96 %-98 %] 98 % (03/18 1326) Weight change:  Last BM Date: 10/23/20  PE: GEN:  NAD  Lab Results: CBC    Component Value Date/Time   WBC 8.0 10/23/2020 0438   RBC 3.30 (L) 10/23/2020 0438   HGB 10.6 (L) 10/23/2020 0438   HGB 13.8 02/10/2017 0953   HCT 31.3 (L) 10/23/2020 0438   HCT 40.1 02/10/2017 0953   PLT 356 10/23/2020 0438   PLT 266 02/10/2017 0953   MCV 94.8 10/23/2020 0438   MCV 94.1 02/10/2017 0953   MCH 32.1 10/23/2020 0438   MCHC 33.9 10/23/2020 0438   RDW 13.7 10/23/2020 0438   RDW 12.9 02/10/2017 0953   LYMPHSABS 1.6 10/21/2020 0437   LYMPHSABS 3.0 02/10/2017 0953   MONOABS 0.5 10/21/2020 0437   MONOABS 0.5 02/10/2017 0953   EOSABS 0.4 10/21/2020 0437   EOSABS 0.2 02/10/2017 0953   BASOSABS 0.1 10/21/2020 0437   BASOSABS 0.0 02/10/2017 0953   CMP     Component Value Date/Time   NA 132 (L) 10/22/2020 0944   NA 137 05/19/2020 1455   NA 137 02/10/2017 0954   K 4.0 10/22/2020 0944   K 3.9 02/10/2017 0954   CL 100 10/22/2020 0944   CO2 23 10/22/2020 0944   CO2 23 02/10/2017 0954   GLUCOSE 96 10/22/2020 0944   GLUCOSE 87 02/10/2017 0954   BUN 10 10/22/2020 0944   BUN 11 05/19/2020 1455   BUN 14.8 02/10/2017 0954   CREATININE 0.86 10/22/2020 0944   CREATININE 1.0 02/10/2017 0954   CALCIUM 8.7 (L) 10/22/2020 0944   CALCIUM 10.1 02/10/2017 0954   PROT 5.4 (L) 10/22/2020 0944   PROT 7.0 02/10/2017 0954   PROT 6.4 02/10/2017 0953   ALBUMIN 2.7 (L) 10/22/2020 0944   ALBUMIN 3.7 02/10/2017 0954   AST 22 10/22/2020 0944   AST 21 02/10/2017 0954   ALT 29 10/22/2020 0944   ALT 14 02/10/2017 0954   ALKPHOS 120 10/22/2020 0944   ALKPHOS 115 02/10/2017 0954   BILITOT 1.1  10/22/2020 0944   BILITOT 0.66 02/10/2017 0954   GFRNONAA >60 10/22/2020 0944   GFRAA 61 05/19/2020 1455    Assessment:  1.  Bile leak s/p ERCP with biliary sphincterotomy, biliary stent placement, pancreatic stent placement. 2.  Post-procedural pain.  Unclear etiology.  Essentially resolved.  Increase leukocytosis without fever, now normal WBC; slightly elevated lipase.    Plan:  1.  OK to discharge home from GI perspective. 2.  We will arrange outpatient follow-up with me in clinic, with tentative plan for repeat ERCP with stent removal and cholangiogram in about 6 weeks. 3.  [facility] will sign-off; please call with questions; thank you for the consultation.   Tracey Mcclure 10/23/2020, 1:35 PM   Cell [phone] If no answer or after 5 PM call [phone]

## 2020-10-23 NOTE — Progress Notes (Addendum)
Physical Therapy Treatment Patient Details Name: Tracey Mcclure MRN: [id number] DOB: [birth year] Today's Date: 10/23/2020    History of Present Illness 80 yo female s/p cholecystectomy 10/16/20. s/p  ERCP  10/21/20.  Hx of Afib, SSS-pacemaker, memory deficits, obesity    PT Comments    Pt is progressing well with mobility. She ambulated 400' holding IV pole, no loss of balance. She was able to ambulate without holding the IV pole and had no loss of balance, but stated she prefers using the pole for extra support. 3/10 abdominal pain with activity. She has met PT goals and is safe to walk in the halls independently. Encouraged gradual progression of activity upon DC home. Also instructed pt in seated hip flexion, knee extension, and ankle pumps to be done independently for strengthening.  PT signing off.    Follow Up Recommendations  No PT follow up (pt appears at prior level of mobility)     Equipment Recommendations  None recommended by PT    Recommendations for Other Services       Precautions / Restrictions Precautions Precautions: Fall Restrictions Weight Bearing Restrictions: No    Mobility  Bed Mobility               General bed mobility comments: Pt in recliner.    Transfers Overall transfer level: Independent Equipment used: None Transfers: Sit to/from Stand Sit to Stand: Independent            Ambulation/Gait Ambulation/Gait assistance: Modified independent (Device/Increase time) Gait Distance (Feet): 400 Feet Assistive device: IV Pole Gait Pattern/deviations: WFL(Within Functional Limits) Gait velocity: WNL   General Gait Details: steady with IV pole, pt also ambulated ~40' without IV pole without LOB but reported she preferred using the pole bc of feeling generalized weakness   Stairs             Wheelchair Mobility    Modified Rankin (Stroke Patients Only)       Balance Overall balance assessment: Modified Independent                                           Cognition Arousal/Alertness: Awake/alert Behavior During Therapy: WFL for tasks assessed/performed Overall Cognitive Status: Within Functional Limits for tasks assessed                                        Exercises      General Comments        Pertinent Vitals/Pain Pain Score: 3  Pain Location: abdomen RT and LT sides. Pain Descriptors / Indicators: Aching Pain Intervention(s): Limited activity within patient's tolerance;Monitored during session;Premedicated before session    Home Living                      Prior Function            PT Goals (current goals can now be found in the care plan section) Acute Rehab PT Goals Patient Stated Goal: Become well enough to be able to travel to her hometown of [place], [place] and visit friends and go out to eat PT Goal Formulation: With patient/family Time For Goal Achievement: 11/02/20 Potential to Achieve Goals: Good Progress towards PT goals: Goals met/education completed, patient discharged from PT    Frequency  Min 3X/week      PT Plan Current plan remains appropriate    Co-evaluation              AM-PAC PT "6 Clicks" Mobility   Outcome Measure  Help needed turning from your back to your side while in a flat bed without using bedrails?: None Help needed moving from lying on your back to sitting on the side of a flat bed without using bedrails?: None Help needed moving to and from a bed to a chair (including a wheelchair)?: None Help needed standing up from a chair using your arms (e.g., wheelchair or bedside chair)?: None Help needed to walk in hospital room?: None Help needed climbing 3-5 steps with a railing? : None 6 Click Score: 24    End of Session Equipment Utilized During Treatment: Gait belt Activity Tolerance: Patient tolerated treatment well Patient left: with call bell/phone within reach;with family/visitor present;in  chair   PT Visit Diagnosis: Unsteadiness on feet (R26.81)     Time: 6659-9357 PT Time Calculation (min) (ACUTE ONLY): 10 min  Charges:  $Gait Training: 8-22 mins                    Blondell Reveal Kistler PT 10/23/2020  Acute Rehabilitation Services Pager [phone] Office [phone]

## 2020-10-24 NOTE — Discharge Instructions (Signed)
[facility]  Please arrive at least 30 min before your appointment to complete your check in paperwork.  If you are unable to arrive 30 min prior to your appointment time we may have to cancel or reschedule you. LAPAROSCOPIC SURGERY: POST OP INSTRUCTIONS Always review your discharge instruction sheet given to you by the facility where your surgery was performed. IF YOU HAVE DISABILITY OR FAMILY LEAVE FORMS, YOU MUST BRING THEM TO THE OFFICE FOR PROCESSING.   DO NOT GIVE THEM TO YOUR DOCTOR.  PAIN CONTROL  1. First take acetaminophen (Tylenol) AND/or ibuprofen (Advil) to control your pain after surgery.  Follow directions on package.  Taking acetaminophen (Tylenol) and/or ibuprofen (Advil) regularly after surgery will help to control your pain and lower the amount of prescription pain medication you may need.  You should not take more than 4,000 mg (4 grams) of acetaminophen (Tylenol) in 24 hours.  You should not take ibuprofen (Advil), aleve, motrin, naprosyn or other NSAIDS if you have a history of stomach ulcers or chronic kidney disease.  2. A prescription for pain medication may be given to you upon discharge.  Take your pain medication as prescribed, if you still have uncontrolled pain after taking acetaminophen (Tylenol) or ibuprofen (Advil). 3. Use ice packs to help control pain. 4. If you need a refill on your pain medication, please contact your pharmacy.  They will contact our office to request authorization. Prescriptions will not be filled after 5pm or on week-ends.  HOME MEDICATIONS 5. Take your usually prescribed medications unless otherwise directed.  DIET 6. You should follow a light diet the first few days after arrival home.  Be sure to include lots of fluids daily. Avoid fatty, fried foods.   CONSTIPATION 7. It is common to experience some constipation after surgery and if you are taking pain medication.  Increasing fluid intake and taking a stool  softener (such as Colace) will usually help or prevent this problem from occurring.  A mild laxative (Milk of Magnesia or Miralax) should be taken according to package instructions if there are no bowel movements after 48 hours.  WOUND/INCISION CARE 8. Most patients will experience some swelling and bruising in the area of the incisions.  Ice packs will help.  Swelling and bruising can take several days to resolve.  9. Unless discharge instructions indicate otherwise, follow guidelines below  a. STERI-STRIPS - you may remove your outer bandages 48 hours after surgery, and you may shower at that time.  You have steri-strips (small skin tapes) in place directly over the incision.  These strips should be left on the skin for 7-10 days.   b. DERMABOND/SKIN GLUE - you may shower in 24 hours.  The glue will flake off over the next 2-3 weeks. 10. Any sutures or staples will be removed at the office during your follow-up visit.  ACTIVITIES 11. You may resume regular (light) daily activities beginning the next day--such as daily self-care, walking, climbing stairs--gradually increasing activities as tolerated.  You may have sexual intercourse when it is comfortable.  Refrain from any heavy lifting or straining until approved by your doctor. a. You may drive when you are no longer taking prescription pain medication, you can comfortably wear a seatbelt, and you can safely maneuver your car and apply brakes.  FOLLOW-UP 12. You should see your doctor in the office for a follow-up appointment approximately 2-3 weeks after your surgery.  You should have been given your post-op/follow-up appointment when   your surgery was scheduled.  If you did not receive a post-op/follow-up appointment, make sure that you call for this appointment within a day or two after you arrive home to insure a convenient appointment time.   WHEN TO CALL YOUR DOCTOR: 1. Fever over 101.0 2. Inability to urinate 3. Continued bleeding from  incision. 4. Increased pain, redness, or drainage from the incision. 5. Increasing abdominal pain  The clinic staff is available to answer your questions during regular business hours.  Please don't hesitate to call and ask to speak to one of the nurses for clinical concerns.  If you have a medical emergency, go to the nearest emergency room or call 911.  A surgeon from [facility] is always on call at the hospital. [address] [place], [place], [place]  [address] ? [address], [place], [place]   [address] [phone] ? [phone] ? FAX [phone]      JP Drain Totals  Bring this sheet to all of your post-operative appointments while you have your drains.  Please measure your drains by CC's or ML's.  Make sure you drain and measure your JP Drains 3 times per day.  At the end of each day, add up totals for the left side and add up totals for the right side.    ( 9 am )     ( 3 pm )        ( 9 pm )                Date L  R  L  R  L  R  Total L/R                                                                                                                                                                                        Surgical Drain Home Care Surgical drains are used to remove extra fluid that normally builds up in a surgical wound after surgery. A surgical drain helps to heal a surgical wound. Different kinds of surgical drains include:  Active drains. These drains use suction to pull drainage away from the surgical wound. Drainage flows through a tube to a container outside of the body. With these drains, you need to keep the bulb or the drainage container flat (compressed) at all times, except while you empty it. Flattening the bulb or container creates suction.  Passive drains. These drains allow fluid to drain naturally, by gravity. Drainage flows through a tube to a bandage (dressing) or a container outside of the body. Passive drains  do not need to be emptied. A drain is  placed during surgery. Right after surgery, drainage is usually bright red and a little thicker than water. The drainage may gradually turn yellow or pink and become thinner. It is likely that your health care provider will remove the drain when the drainage stops or when the amount decreases to 1-2 Tbsp (15-30 mL) during a 24-hour period. Supplies needed:  Tape.  Germ-free cleaning solution (sterile saline).  Cotton swabs.  Split gauze drain sponge: 4 x 4 inches (10 x 10 cm).  Gauze square: 4 x 4 inches (10 x 10 cm). How to care for your surgical drain Care for your drain as told by your health care provider. This is important to help prevent infection. If your drain is placed at your back, or any other hard-to-reach area, ask another person to assist you in performing the following tasks: General care  Keep the skin around the drain dry and covered with a dressing at all times.  Check your drain area every day for signs of infection. Check for: ? Redness, swelling, or pain. ? Pus or a bad smell. ? Cloudy drainage. ? Tenderness or pressure at the drain exit site. Changing the dressing Follow instructions from your health care provider about how to change your dressing. Change your dressing at least once a day. Change it more often if needed to keep the dressing dry. Make sure you: 1. Gather your supplies. 2. Wash your hands with soap and water before you change your dressing. If soap and water are not available, use hand sanitizer. 3. Remove the old dressing. Avoid using scissors to do that. 4. Wash your hands with soap and water again after removing the old dressing. 5. Use sterile saline to clean your skin around the drain. You may need to use a cotton swab to clean the skin. 6. Place the tube through the slit in a drain sponge. Place the drain sponge so that it covers your wound. 7. Place the gauze square or another drain sponge on top of the  drain sponge that is on the wound. Make sure the tube is between those layers. 8. Tape the dressing to your skin. 9. Tape the drainage tube to your skin 1-2 inches (2.5-5 cm) below the place where the tube enters your body. Taping keeps the tube from pulling on any stitches (sutures) that you have. 10. Wash your hands with soap and water. 11. Write down the color of your drainage and how often you change your dressing. How to empty your active drain 1. Make sure that you have a measuring cup that you can empty your drainage into. 2. Wash your hands with soap and water. If soap and water are not available, use hand sanitizer. 3. Loosen any pins or clips that hold the tube in place. 4. If your health care provider tells you to strip the tube to prevent clots and tube blockages: ? Hold the tube at the skin with one hand. Use your other hand to pinch the tubing with your thumb and first finger. ? Gently move your fingers down the tube while squeezing very lightly. This clears any drainage, clots, or tissue from the tube. ? You may need to do this several times each day to keep the tube clear. Do not pull on the tube. 5. Open the bulb cap or the drain plug. Do not touch the inside of the cap or the bottom of the plug. 6. Turn the device upside down and gently squeeze. 7. Empty all of the  drainage into the measuring cup. 8. Compress the bulb or the container and replace the cap or the plug. To compress the bulb or the container, squeeze it firmly in the middle while you close the cap or plug the container. 9. Write down the amount of drainage that you have in each 24-hour period. If you have less than 2 Tbsp (30 mL) of drainage during 24 hours, contact your health care provider. 10. Flush the drainage down the toilet. 11. Wash your hands with soap and water.   Contact a health care provider if:  You have redness, swelling, or pain around your drain area.  You have pus or a bad smell coming from  your drain area.  You have a fever or chills.  The skin around your drain is warm to the touch.  The amount of drainage that you have is increasing instead of decreasing.  You have drainage that is cloudy.  There is a sudden stop or a sudden decrease in the amount of drainage that you have.  Your drain tube falls out.  Your active drain does not stay compressed after you empty it. Summary  Surgical drains are used to remove extra fluid that normally builds up in a surgical wound after surgery.  Different kinds of surgical drains include active drains and passive drains. Active drains use suction to pull drainage away from the surgical wound, and passive drains allow fluid to drain naturally.  It is important to care for your drain to prevent infection. If your drain is placed at your back, or any other hard-to-reach area, ask another person to assist you.  Contact your health care provider if you have redness, swelling, or pain around your drain area. This information is not intended to replace advice given to you by your health care provider. Make sure you discuss any questions you have with your health care provider. Document Revised: 08/29/2018 Document Reviewed: 08/29/2018 Elsevier Patient Education  2021 [employer].

## 2020-10-24 NOTE — Progress Notes (Signed)
[facility] Progress Note  3 Days Post-Op  Subjective: CC-  Continues slow improvement.  Getting drain teaching.   Objective: Vital signs in last 24 hours: Temp:  [97.7 F (36.5 C)-98 F (36.7 C)] 98 F (36.7 C) (03/19 0531) Pulse Rate:  [59-79] 60 (03/19 0531) Resp:  [15-18] 15 (03/19 0531) BP: (137-158)/(69-80) 149/72 (03/19 0531) SpO2:  [96 %-98 %] 96 % (03/19 0531) Last BM Date: 10/23/20  Intake/Output from previous day: 03/18 0701 - 03/19 0700 In: 1260 [P.O.:1260] Out: 60 [Drains:60] Intake/Output this shift: Total I/O In: 240 [P.O.:240] Out: 0   PE: Gen:  Alert, NAD, pleasant Sitting up in bed. Pulm:  rate and effort normal Abd: Soft, ND, approp tender at drain. incisions cdi, JP drain murky/bilious  Lab Results:  Recent Labs    10/22/20 0944 10/23/20 0438  WBC 14.0* 8.0  HGB 11.6* 10.6*  HCT 35.0* 31.3*  PLT 383 356   BMET Recent Labs    10/22/20 0944  NA 132*  K 4.0  CL 100  CO2 23  GLUCOSE 96  BUN 10  CREATININE 0.86  CALCIUM 8.7*   PT/INR No results for input(s): LABPROT, INR in the last 72 hours. CMP     Component Value Date/Time   NA 132 (L) 10/22/2020 0944   NA 137 05/19/2020 1455   NA 137 02/10/2017 0954   K 4.0 10/22/2020 0944   K 3.9 02/10/2017 0954   CL 100 10/22/2020 0944   CO2 23 10/22/2020 0944   CO2 23 02/10/2017 0954   GLUCOSE 96 10/22/2020 0944   GLUCOSE 87 02/10/2017 0954   BUN 10 10/22/2020 0944   BUN 11 05/19/2020 1455   BUN 14.8 02/10/2017 0954   CREATININE 0.86 10/22/2020 0944   CREATININE 1.0 02/10/2017 0954   CALCIUM 8.7 (L) 10/22/2020 0944   CALCIUM 10.1 02/10/2017 0954   PROT 5.4 (L) 10/22/2020 0944   PROT 7.0 02/10/2017 0954   PROT 6.4 02/10/2017 0953   ALBUMIN 2.7 (L) 10/22/2020 0944   ALBUMIN 3.7 02/10/2017 0954   AST 22 10/22/2020 0944   AST 21 02/10/2017 0954   ALT 29 10/22/2020 0944   ALT 14 02/10/2017 0954   ALKPHOS 120 10/22/2020 0944   ALKPHOS 115 02/10/2017 0954   BILITOT  1.1 10/22/2020 0944   BILITOT 0.66 02/10/2017 0954   GFRNONAA >60 10/22/2020 0944   GFRAA 61 05/19/2020 1455   Lipase     Component Value Date/Time   LIPASE 53 (H) 10/23/2020 0438       Studies/Results: DG Cervical Spine 2 or 3 views  Result Date: 10/23/2020 CLINICAL DATA:  Left shoulder pain. EXAM: CERVICAL SPINE - 2-3 VIEW COMPARISON:  None. FINDINGS: AP and lateral views obtained. Trace anterolisthesis of C7 on T1 is likely degenerative. Normal alignment. Normal vertebral body heights. No evidence of fracture. Multilevel degenerative disc disease including C3-C4, C5-C6, and C6-C7. There is multilevel facet hypertrophy. The dens is intact on lateral view, obscured on the AP view. No evidence of fracture, bony destruction or focal lesion. No prevertebral soft tissue edema. IMPRESSION: Multilevel degenerative disc disease and facet hypertrophy throughout the cervical spine. No acute findings. Electronically Signed   By: Keith Rake M.D.   On: 10/23/2020 18:19   DG Shoulder Left  Result Date: 10/23/2020 CLINICAL DATA:  Left shoulder pain. EXAM: LEFT SHOULDER - 2+ VIEW COMPARISON:  None. FINDINGS: There is no evidence of fracture or dislocation. Minimal acromioclavicular spurring. Trace inferior glenoid spurs. No evidence of  a vascular necrosis, focal bone lesion or bone destruction. Small soft tissue calcification adjacent to the rotator cuff insertion. Pacemaker partially included projecting over the left shoulder. IMPRESSION: Minimal osteoarthritis of the left shoulder. Small soft tissue calcification adjacent to the rotator cuff insertion may represent calcific tendinopathy. Electronically Signed   By: Keith Rake M.D.   On: 10/23/2020 18:20    Anti-infectives: Anti-infectives (From admission, onward)   Start     Dose/Rate Route Frequency Ordered Stop   10/21/20 1030  ciprofloxacin (CIPRO) IVPB 400 mg        400 mg 200 mL/hr over 60 Minutes Intravenous 60 min pre-op 10/21/20  0048 10/21/20 1221   10/17/20 1200  piperacillin-tazobactam (ZOSYN) IVPB 3.375 g        3.375 g 12.5 mL/hr over 240 Minutes Intravenous Every 8 hours 10/17/20 1041 10/22/20 1159   10/17/20 1000  cefTRIAXone (ROCEPHIN) 2 g in sodium chloride 0.9 % 100 mL IVPB  Status:  Discontinued        2 g 200 mL/hr over 30 Minutes Intravenous Every 24 hours 10/16/20 1639 10/17/20 1041   10/15/20 1200  cefTRIAXone (ROCEPHIN) 2 g in sodium chloride 0.9 % 100 mL IVPB  Status:  Discontinued        2 g 200 mL/hr over 30 Minutes Intravenous Every 24 hours 10/15/20 0948 10/16/20 1639       Assessment/Plan Atrial fibrillation on chronic anticoagulation last dose Eliquis p.m. 10/12/2020 Permanent transvenous pacemaker/almost 100% pacing Hypertrophic cardiomyopathy/moderate MI Cardiology consult 09/06/2020: Low to moderate risk, minimize time off anticoagulation Hypertension Hypothyroid Monoclonal gammopathy of unknown significance Hypokalemia Elevated LFTs - resolved Decreased appetite/weight loss-16 pounds since 09/05/2020  POD 8, s/p lap chole by Dr. Hassell Done 3/11, for cholecystitis, s/p perc chole drain placement and dislodgement without being able to be replaced Looks to have low grade bile leak. -completed ABX therapy. WBC WNL, afebrile -s/p ERCP 3/16 with stent placement in CBD and pancreatic duct. -pulm toilet, mobilize,IS  FEN: HH diet ID: [email or web address] 3/12>>3/17 DVT: Eliquis last dose p.m. 10/12/2020. SCD, ok with lovenox from surgery standpoint  Plan: OK for home with ok with medicine.     LOS: 10 days   Milus Height, MD FACS Surgical Oncology, General Surgery, Trauma and [facility], [place] for weekday/non holidays Check [email or web address] for coverage night/weekend/holidays  Do not use SecureChat as it is not reliable for timely patient care.

## 2020-10-24 NOTE — Progress Notes (Signed)
PROGRESS NOTE    Tracey Mcclure  VQQ:[id number] DOB: 1941/06/08 DOA: 10/13/2020 PCP: Crist Infante, MD   Brief Narrative: Tracey Mcclure is a 80 y.o. female with a history of permanent atrial fibrillation, sick sinus syndrome s/p pacemaker, HOCM, hypertension, MGUS, hypothyroidism. Patient presented secondary to abdominal pain, nausea, malaise in setting of acute on chronic cholecystitis recently treated with a percutaneous drain and antibiotics. Patient underwent laparoscopic cholecystectomy in addition to biliary/pancreatic duct stent placement for bile leak.   Assessment & Plan:   Principal Problem:   Acute calculous cholecystitis s/p cholecystectomy with oversew cystic duct 10/16/2020 Active Problems:   Hypertrophic obstructive cardiomyopathy (HCC)   HTN (hypertension)   Hypothyroidism   Obesity   MGUS (monoclonal gammopathy of unknown significance)   Atrial fibrillation (HCC)   Pacemaker   Hypokalemia   Hyponatremia   Transaminitis   Choledocholithiasis   Memory difficulty   Chronic anticoagulation  Bile leak Acute on chronic cholecystitis Repeat drain placed on admission. General surgery consulted and patient underwent laparoscopic cholecystecomy on 3/11; attempted intraoperative cholangiogram resulted in extravasation. GI consulted and performed ERCP on 3/16 with biliary sphincterotomy performed in addition to bile duct and ventral pancreatic duct plastic stent placement. Zosyn IV x5 days prescribed and completed. Patient stable for discharge today with recommendations for follow-up with GI and general surgery.  Hyponatremia Mild. Resolved.  Hypokalemia Mild. Resolved.  Hypomagnesemia Mild. Resolved.  Hypothyroidism -Continue Synthroid 125 mcg daily  Permanent atrial fibrillation History of sick sinus syndrome Rate controlled. CHA2DS2-VASc Score is 4. History of pacer placement. Patient is on Eliquis as an outpatient. Not on rate or rhythm control medication.  Eliquis held secondary to inpatient procedures -Resume anticoagulation per GI recommendations. Okay to resume per general surgery  Primary hypertension Patient is on hydrochlorothiazide as an outpatient.  Hyperlipidemia Patient is on Lipitor 40 mg daily which was held on admission -Continue Lipitor  Thrombocytosis Transient. Resolved.  Hypoalbuminemia -Dietitian consult  MGUS Inactive.  HOCM Noted.  Left shoulder pain Likely musculoskeletal. Managed with Tylenol and heating pad. Some cervical arthritis noticed on cervical x-ray. Recommend continued Tylenol, ambulation, etc.   DVT prophylaxis: SCDs Code Status:   Code Status: DNR Family Communication: Cousin at bedside Disposition Plan: Discharge today   Consultants:   General surgery  Gastroenterology [facility])  Procedures:   LAPAROSCOPIC CHOLECYSTECTOMY and PERIOPERATIVE CHOLANGIOGRAM (10/16/2020)  ERCP (10/21/2020) Impression:               - The patient has had a cholecystectomy.                           - Bile leak, cystic duct stump, treated with                            biliary sphincterotomy and bile duct stent                            placement.                           - One plastic stent was placed into the ventral                            pancreatic duct.                           -  One plastic stent was placed into the common bile                            duct. Recommendation:           - Avoid aspirin and nonsteroidal anti-inflammatory                            medicines for 3 days.                           - Clear liquid diet today.                           - Continue present medications.                           [facility] will follow.                           - Will need repeat EGD/ERCP for stent removal and                            repeat cholangiogram in 6-8 weeks.  Antimicrobials:  Zosyn IV    Subjective: No pain this morning.  Objective: Vitals:   10/23/20 1326  10/23/20 1352 10/23/20 2119 10/24/20 0531  BP: (!) 158/80 (!) 153/73 137/69 (!) 149/72  Pulse: 79 (!) 59 62 60  Resp: 16 18 17 15   Temp: 97.7 F (36.5 C) 97.8 F (36.6 C) 97.7 F (36.5 C) 98 F (36.7 C)  TempSrc: Oral  Oral Oral  SpO2: 98% 98% 96% 96%  Weight:      Height:        Intake/Output Summary (Last 24 hours) at 10/24/2020 1124 Last data filed at 10/24/2020 6767 Gross per 24 hour  Intake 1260 ml  Output 60 ml  Net 1200 ml   Filed Weights   10/13/20 0346  Weight: 76.2 kg    Examination:  General exam: Appears calm and comfortable  Respiratory system: Respiratory effort normal. Gastrointestinal system: Abdomen is nondistended, soft and nontender. Dressing clean/dry. Drain site appears normal. Central nervous system: Alert and oriented. No focal neurological deficits. Psychiatry: Judgement and insight appear normal. Mood & affect appropriate.     Data Reviewed: I have personally reviewed following labs and imaging studies  CBC Lab Results  Component Value Date   WBC 8.0 10/23/2020   RBC 3.30 (L) 10/23/2020   HGB 10.6 (L) 10/23/2020   HCT 31.3 (L) 10/23/2020   MCV 94.8 10/23/2020   MCH 32.1 10/23/2020   PLT 356 10/23/2020   MCHC 33.9 10/23/2020   RDW 13.7 10/23/2020   LYMPHSABS 1.6 10/21/2020   MONOABS 0.5 10/21/2020   EOSABS 0.4 10/21/2020   BASOSABS 0.1 20/94/7096     Last metabolic panel Lab Results  Component Value Date   NA 132 (L) 10/22/2020   K 4.0 10/22/2020   CL 100 10/22/2020   CO2 23 10/22/2020   BUN 10 10/22/2020   CREATININE 0.86 10/22/2020   GLUCOSE 96 10/22/2020   GFRNONAA >60 10/22/2020   GFRAA 61 05/19/2020   CALCIUM 8.7 (L) 10/22/2020   PHOS 3.5 10/21/2020   PROT 5.4 (L) 10/22/2020  ALBUMIN 2.7 (L) 10/22/2020   LABGLOB 3.0 02/16/2018   BILITOT 1.1 10/22/2020   ALKPHOS 120 10/22/2020   AST 22 10/22/2020   ALT 29 10/22/2020   ANIONGAP 9 10/22/2020    CBG (last 3)  No results for input(s): GLUCAP in the last 72  hours.   GFR: Estimated Creatinine Clearance: 53 mL/min (by C-G formula based on SCr of 0.86 mg/dL).  Coagulation Profile: No results for input(s): INR, PROTIME in the last 168 hours.  Recent Results (from the past 240 hour(s))  MRSA PCR Screening     Status: None   Collection Time: 10/15/20  6:01 AM   Specimen: Nasal Mucosa; Nasopharyngeal  Result Value Ref Range Status   MRSA by PCR NEGATIVE NEGATIVE Final    Comment:        The GeneXpert MRSA Assay (FDA approved for NASAL specimens only), is one component of a comprehensive MRSA colonization surveillance program. It is not intended to diagnose MRSA infection nor to guide or monitor treatment for MRSA infections. Performed at [facility], [place] [address], [place], [place] [address]         Radiology Studies: DG Cervical Spine 2 or 3 views  Result Date: 10/23/2020 CLINICAL DATA:  Left shoulder pain. EXAM: CERVICAL SPINE - 2-3 VIEW COMPARISON:  None. FINDINGS: AP and lateral views obtained. Trace anterolisthesis of C7 on T1 is likely degenerative. Normal alignment. Normal vertebral body heights. No evidence of fracture. Multilevel degenerative disc disease including C3-C4, C5-C6, and C6-C7. There is multilevel facet hypertrophy. The dens is intact on lateral view, obscured on the AP view. No evidence of fracture, bony destruction or focal lesion. No prevertebral soft tissue edema. IMPRESSION: Multilevel degenerative disc disease and facet hypertrophy throughout the cervical spine. No acute findings. Electronically Signed   By: Keith Rake M.D.   On: 10/23/2020 18:19   DG Shoulder Left  Result Date: 10/23/2020 CLINICAL DATA:  Left shoulder pain. EXAM: LEFT SHOULDER - 2+ VIEW COMPARISON:  None. FINDINGS: There is no evidence of fracture or dislocation. Minimal acromioclavicular spurring. Trace inferior glenoid spurs. No evidence of a vascular necrosis, focal bone lesion or bone destruction. Small soft  tissue calcification adjacent to the rotator cuff insertion. Pacemaker partially included projecting over the left shoulder. IMPRESSION: Minimal osteoarthritis of the left shoulder. Small soft tissue calcification adjacent to the rotator cuff insertion may represent calcific tendinopathy. Electronically Signed   By: Keith Rake M.D.   On: 10/23/2020 18:20        Scheduled Meds: . acetaminophen  1,000 mg Oral TID  . feeding supplement  1 Container Oral TID BM  . feeding supplement  237 mL Oral BID BM  . levothyroxine  125 mcg Oral Once per day on Mon Tue Wed Thu Fri Sat  . levothyroxine  62.5 mcg Oral Q Sun  . lip balm  1 application Topical BID  . polycarbophil  625 mg Oral BID  . sodium chloride flush  3 mL Intravenous Q12H   Continuous Infusions: . sodium chloride       LOS: 10 days     Cordelia Poche, MD [facility] 10/24/2020, 11:24 AM  If 7PM-7AM, please contact night-coverage [email or web address]

## 2020-10-28 DIAGNOSIS — I422 Other hypertrophic cardiomyopathy: Secondary | ICD-10-CM | POA: Diagnosis not present

## 2020-10-28 DIAGNOSIS — I4891 Unspecified atrial fibrillation: Secondary | ICD-10-CM | POA: Diagnosis not present

## 2020-10-28 DIAGNOSIS — K8000 Calculus of gallbladder with acute cholecystitis without obstruction: Secondary | ICD-10-CM | POA: Diagnosis not present

## 2020-10-28 DIAGNOSIS — Z4803 Encounter for change or removal of drains: Secondary | ICD-10-CM | POA: Diagnosis not present

## 2020-10-28 DIAGNOSIS — I4892 Unspecified atrial flutter: Secondary | ICD-10-CM | POA: Diagnosis not present

## 2020-10-28 DIAGNOSIS — I119 Hypertensive heart disease without heart failure: Secondary | ICD-10-CM | POA: Diagnosis not present

## 2020-10-29 ENCOUNTER — Other Ambulatory Visit: Payer: Self-pay | Admitting: Gastroenterology

## 2020-10-30 DIAGNOSIS — I4891 Unspecified atrial fibrillation: Secondary | ICD-10-CM | POA: Diagnosis not present

## 2020-10-30 DIAGNOSIS — I421 Obstructive hypertrophic cardiomyopathy: Secondary | ICD-10-CM | POA: Diagnosis not present

## 2020-10-30 DIAGNOSIS — I422 Other hypertrophic cardiomyopathy: Secondary | ICD-10-CM | POA: Diagnosis not present

## 2020-10-30 DIAGNOSIS — I4892 Unspecified atrial flutter: Secondary | ICD-10-CM | POA: Diagnosis not present

## 2020-10-30 DIAGNOSIS — D472 Monoclonal gammopathy: Secondary | ICD-10-CM | POA: Diagnosis not present

## 2020-10-30 DIAGNOSIS — E8809 Other disorders of plasma-protein metabolism, not elsewhere classified: Secondary | ICD-10-CM | POA: Diagnosis not present

## 2020-10-30 DIAGNOSIS — K8000 Calculus of gallbladder with acute cholecystitis without obstruction: Secondary | ICD-10-CM | POA: Diagnosis not present

## 2020-10-30 DIAGNOSIS — E039 Hypothyroidism, unspecified: Secondary | ICD-10-CM | POA: Diagnosis not present

## 2020-10-30 DIAGNOSIS — Z4803 Encounter for change or removal of drains: Secondary | ICD-10-CM | POA: Diagnosis not present

## 2020-10-30 DIAGNOSIS — E785 Hyperlipidemia, unspecified: Secondary | ICD-10-CM | POA: Diagnosis not present

## 2020-10-30 DIAGNOSIS — N1831 Chronic kidney disease, stage 3a: Secondary | ICD-10-CM | POA: Diagnosis not present

## 2020-10-30 DIAGNOSIS — I129 Hypertensive chronic kidney disease with stage 1 through stage 4 chronic kidney disease, or unspecified chronic kidney disease: Secondary | ICD-10-CM | POA: Diagnosis not present

## 2020-10-30 DIAGNOSIS — M25512 Pain in left shoulder: Secondary | ICD-10-CM | POA: Diagnosis not present

## 2020-10-30 DIAGNOSIS — D75839 Thrombocytosis, unspecified: Secondary | ICD-10-CM | POA: Diagnosis not present

## 2020-10-30 DIAGNOSIS — I119 Hypertensive heart disease without heart failure: Secondary | ICD-10-CM | POA: Diagnosis not present

## 2020-11-03 DIAGNOSIS — Z4803 Encounter for change or removal of drains: Secondary | ICD-10-CM | POA: Diagnosis not present

## 2020-11-03 DIAGNOSIS — I422 Other hypertrophic cardiomyopathy: Secondary | ICD-10-CM | POA: Diagnosis not present

## 2020-11-03 DIAGNOSIS — I119 Hypertensive heart disease without heart failure: Secondary | ICD-10-CM | POA: Diagnosis not present

## 2020-11-03 DIAGNOSIS — I4891 Unspecified atrial fibrillation: Secondary | ICD-10-CM | POA: Diagnosis not present

## 2020-11-03 DIAGNOSIS — K8000 Calculus of gallbladder with acute cholecystitis without obstruction: Secondary | ICD-10-CM | POA: Diagnosis not present

## 2020-11-03 DIAGNOSIS — I4892 Unspecified atrial flutter: Secondary | ICD-10-CM | POA: Diagnosis not present

## 2020-11-05 DIAGNOSIS — K8000 Calculus of gallbladder with acute cholecystitis without obstruction: Secondary | ICD-10-CM | POA: Diagnosis not present

## 2020-11-05 DIAGNOSIS — Z4803 Encounter for change or removal of drains: Secondary | ICD-10-CM | POA: Diagnosis not present

## 2020-11-05 DIAGNOSIS — I4892 Unspecified atrial flutter: Secondary | ICD-10-CM | POA: Diagnosis not present

## 2020-11-05 DIAGNOSIS — I119 Hypertensive heart disease without heart failure: Secondary | ICD-10-CM | POA: Diagnosis not present

## 2020-11-05 DIAGNOSIS — I422 Other hypertrophic cardiomyopathy: Secondary | ICD-10-CM | POA: Diagnosis not present

## 2020-11-05 DIAGNOSIS — I4891 Unspecified atrial fibrillation: Secondary | ICD-10-CM | POA: Diagnosis not present

## 2020-11-10 DIAGNOSIS — K8000 Calculus of gallbladder with acute cholecystitis without obstruction: Secondary | ICD-10-CM | POA: Diagnosis not present

## 2020-11-10 DIAGNOSIS — I422 Other hypertrophic cardiomyopathy: Secondary | ICD-10-CM | POA: Diagnosis not present

## 2020-11-10 DIAGNOSIS — I119 Hypertensive heart disease without heart failure: Secondary | ICD-10-CM | POA: Diagnosis not present

## 2020-11-10 DIAGNOSIS — I4891 Unspecified atrial fibrillation: Secondary | ICD-10-CM | POA: Diagnosis not present

## 2020-11-10 DIAGNOSIS — I4892 Unspecified atrial flutter: Secondary | ICD-10-CM | POA: Diagnosis not present

## 2020-11-10 DIAGNOSIS — Z4803 Encounter for change or removal of drains: Secondary | ICD-10-CM | POA: Diagnosis not present

## 2020-11-24 ENCOUNTER — Other Ambulatory Visit: Payer: Self-pay | Admitting: Internal Medicine

## 2020-11-24 DIAGNOSIS — E785 Hyperlipidemia, unspecified: Secondary | ICD-10-CM | POA: Diagnosis not present

## 2020-11-24 DIAGNOSIS — R1011 Right upper quadrant pain: Secondary | ICD-10-CM

## 2020-11-30 ENCOUNTER — Ambulatory Visit (INDEPENDENT_AMBULATORY_CARE_PROVIDER_SITE_OTHER): Payer: Medicare Other

## 2020-11-30 DIAGNOSIS — I441 Atrioventricular block, second degree: Secondary | ICD-10-CM

## 2020-12-01 DIAGNOSIS — R109 Unspecified abdominal pain: Secondary | ICD-10-CM | POA: Diagnosis not present

## 2020-12-01 DIAGNOSIS — K839 Disease of biliary tract, unspecified: Secondary | ICD-10-CM | POA: Diagnosis not present

## 2020-12-01 LAB — CUP PACEART REMOTE DEVICE CHECK
Battery Remaining Longevity: 59 mo
Battery Voltage: 2.99 V
Brady Statistic RV Percent Paced: 95.24 %
Date Time Interrogation Session: 20220426103728
Implantable Lead Implant Date: 20170320
Implantable Lead Location: 753860
Implantable Lead Model: 5076
Implantable Pulse Generator Implant Date: 20170320
Lead Channel Impedance Value: 399 Ohm
Lead Channel Impedance Value: 437 Ohm
Lead Channel Pacing Threshold Amplitude: 0.625 V
Lead Channel Pacing Threshold Pulse Width: 0.4 ms
Lead Channel Sensing Intrinsic Amplitude: 17.375 mV
Lead Channel Sensing Intrinsic Amplitude: 17.375 mV
Lead Channel Setting Pacing Amplitude: 2 V
Lead Channel Setting Pacing Pulse Width: 0.4 ms
Lead Channel Setting Sensing Sensitivity: 2 mV

## 2020-12-02 ENCOUNTER — Telehealth: Payer: Self-pay

## 2020-12-02 NOTE — Telephone Encounter (Signed)
   [facility] Pre-operative Risk Assessment    Patient Name: Tracey Mcclure  DOB: Dec 23, 1940  MRN:   Request for surgical clearance:  1. What type of surgery is being performed ERCP/colonoscopy   2. When is this surgery scheduled 12/09/20  3. What type of clearance is required (medical clearance vs. Pharmacy clearance to hold med vs. Both) Both  4. Are there any medications that need to be held prior to surgery and how long   Eliquis  5. Practice name and name of physician performing surgery [facility]  Dr.Outlaw  6. What is the office phone number [phone]   7.   What is the office fax number [phone]  8.   Anesthesia type  Propofol   Thedore Mins Yao [place] 12/02/2020, 3:10 PM  _________________________________________________________________   (provider comments below)

## 2020-12-02 NOTE — Telephone Encounter (Signed)
Patient with diagnosis of afib on Eliquis for anticoagulation.    Procedure: ERCP/colonoscopy Date of procedure: 12/09/20  CHA2DS2-VASc Score = 4  This indicates a 4.8% annual risk of stroke. The patient's score is based upon: CHF History: No HTN History: Yes Diabetes History: No Stroke History: No Vascular Disease History: No Age Score: 2 Gender Score: 1     CrCl 53 ml/min Platelet count 356  Per office protocol, patient can hold Eliquis for 1-2 days prior to procedure.

## 2020-12-02 NOTE — Telephone Encounter (Signed)
    Tracey Mcclure DOB:  11-28-1940  MRN:  [id number]   Primary Cardiologist: Peter Martinique, MD  Chart reviewed as part of pre-operative protocol coverage. Given past medical history and time since last visit, based on ACC/AHA guidelines, Tracey Mcclure would be at acceptable risk for the planned procedure without further cardiovascular testing.   The patient was advised that if she develops new symptoms prior to surgery to contact our office to arrange for a follow-up visit, and she verbalized understanding.  Patient may hold Eliquis for 1-2 days prior to the procedure and restart as soon as possible afterward at the discretion of GI doctor.  I will route this recommendation to the requesting party via Epic fax function and remove from pre-op pool.  Please call with questions.  [place], [place] 12/02/2020, 7:04 PM

## 2020-12-02 NOTE — Telephone Encounter (Signed)
Patient was previously seen by Dr. Martinique in March 2022 and was cleared to hold Eliquis for 48 hours prior to gallbladder procedure.  Since then she has underwent gallbladder procedure without any issue.   Will verify with our clinical pharmacist that holding time of Eliquis is unchanged for colonoscopy

## 2020-12-07 ENCOUNTER — Other Ambulatory Visit (HOSPITAL_COMMUNITY)
Admission: RE | Admit: 2020-12-07 | Discharge: 2020-12-07 | Disposition: A | Discharge status: Home / Self Care | Payer: Medicare Other | Source: Ambulatory Visit | Attending: Gastroenterology | Admitting: Gastroenterology

## 2020-12-07 ENCOUNTER — Ambulatory Visit
Admission: RE | Admit: 2020-12-07 | Discharge: 2020-12-07 | Disposition: A | Discharge status: Home / Self Care | Payer: Medicare Other | Source: Ambulatory Visit | Attending: Internal Medicine | Admitting: Internal Medicine

## 2020-12-07 ENCOUNTER — Other Ambulatory Visit: Payer: Self-pay

## 2020-12-07 DIAGNOSIS — Z01812 Encounter for preprocedural laboratory examination: Secondary | ICD-10-CM | POA: Diagnosis not present

## 2020-12-07 DIAGNOSIS — M4317 Spondylolisthesis, lumbosacral region: Secondary | ICD-10-CM | POA: Diagnosis not present

## 2020-12-07 DIAGNOSIS — Z20822 Contact with and (suspected) exposure to covid-19: Secondary | ICD-10-CM | POA: Diagnosis not present

## 2020-12-07 DIAGNOSIS — R1011 Right upper quadrant pain: Secondary | ICD-10-CM

## 2020-12-07 DIAGNOSIS — N2889 Other specified disorders of kidney and ureter: Secondary | ICD-10-CM | POA: Diagnosis not present

## 2020-12-07 DIAGNOSIS — R109 Unspecified abdominal pain: Secondary | ICD-10-CM | POA: Diagnosis not present

## 2020-12-07 DIAGNOSIS — K828 Other specified diseases of gallbladder: Secondary | ICD-10-CM | POA: Diagnosis not present

## 2020-12-07 MED ORDER — IOPAMIDOL (ISOVUE-300) INJECTION 61%
100.0000 mL | Freq: Once | INTRAVENOUS | Status: AC | PRN
  Administered 2020-12-07: 100 mL via INTRAVENOUS

## 2020-12-07 NOTE — Progress Notes (Signed)
Attempted to obtain medical history via telephone, unable to reach at this time. I left a voicemail to return pre surgical testing department's phone call.  

## 2020-12-08 LAB — SARS CORONAVIRUS 2 (TAT 6-24 HRS): SARS Coronavirus 2: NEGATIVE

## 2020-12-09 ENCOUNTER — Ambulatory Visit (HOSPITAL_COMMUNITY): Payer: Medicare Other | Admitting: Anesthesiology

## 2020-12-09 ENCOUNTER — Ambulatory Visit (HOSPITAL_COMMUNITY)
Admission: RE | Admit: 2020-12-09 | Discharge: 2020-12-09 | Disposition: A | Discharge status: Home / Self Care | Payer: Medicare Other | Source: Ambulatory Visit | Attending: Gastroenterology | Admitting: Gastroenterology

## 2020-12-09 ENCOUNTER — Ambulatory Visit (HOSPITAL_COMMUNITY): Payer: Medicare Other

## 2020-12-09 ENCOUNTER — Encounter (HOSPITAL_COMMUNITY): Payer: Self-pay | Admitting: Gastroenterology

## 2020-12-09 ENCOUNTER — Encounter (HOSPITAL_COMMUNITY)
Admission: RE | Disposition: A | Discharge status: Home / Self Care | Payer: Self-pay | Source: Ambulatory Visit | Attending: Gastroenterology

## 2020-12-09 DIAGNOSIS — E039 Hypothyroidism, unspecified: Secondary | ICD-10-CM | POA: Diagnosis not present

## 2020-12-09 DIAGNOSIS — R933 Abnormal findings on diagnostic imaging of other parts of digestive tract: Secondary | ICD-10-CM | POA: Diagnosis not present

## 2020-12-09 DIAGNOSIS — K838 Other specified diseases of biliary tract: Secondary | ICD-10-CM | POA: Diagnosis not present

## 2020-12-09 DIAGNOSIS — T85590A Other mechanical complication of bile duct prosthesis, initial encounter: Secondary | ICD-10-CM | POA: Insufficient documentation

## 2020-12-09 DIAGNOSIS — E7800 Pure hypercholesterolemia, unspecified: Secondary | ICD-10-CM | POA: Diagnosis not present

## 2020-12-09 DIAGNOSIS — Z885 Allergy status to narcotic agent status: Secondary | ICD-10-CM | POA: Insufficient documentation

## 2020-12-09 DIAGNOSIS — Z950 Presence of cardiac pacemaker: Secondary | ICD-10-CM | POA: Diagnosis not present

## 2020-12-09 DIAGNOSIS — Z79899 Other long term (current) drug therapy: Secondary | ICD-10-CM | POA: Insufficient documentation

## 2020-12-09 DIAGNOSIS — Z4659 Encounter for fitting and adjustment of other gastrointestinal appliance and device: Secondary | ICD-10-CM | POA: Diagnosis not present

## 2020-12-09 DIAGNOSIS — Y732 Prosthetic and other implants, materials and accessory gastroenterology and urology devices associated with adverse incidents: Secondary | ICD-10-CM | POA: Diagnosis not present

## 2020-12-09 DIAGNOSIS — I10 Essential (primary) hypertension: Secondary | ICD-10-CM | POA: Diagnosis not present

## 2020-12-09 DIAGNOSIS — R748 Abnormal levels of other serum enzymes: Secondary | ICD-10-CM | POA: Diagnosis not present

## 2020-12-09 DIAGNOSIS — Z881 Allergy status to other antibiotic agents status: Secondary | ICD-10-CM | POA: Diagnosis not present

## 2020-12-09 DIAGNOSIS — Z7901 Long term (current) use of anticoagulants: Secondary | ICD-10-CM | POA: Diagnosis not present

## 2020-12-09 DIAGNOSIS — Z4689 Encounter for fitting and adjustment of other specified devices: Secondary | ICD-10-CM

## 2020-12-09 DIAGNOSIS — R109 Unspecified abdominal pain: Secondary | ICD-10-CM | POA: Diagnosis not present

## 2020-12-09 DIAGNOSIS — Z9689 Presence of other specified functional implants: Secondary | ICD-10-CM | POA: Diagnosis not present

## 2020-12-09 HISTORY — PX: ERCP: SHX5425

## 2020-12-09 HISTORY — PX: STENT REMOVAL: SHX6421

## 2020-12-09 SURGERY — ERCP, WITH INTERVENTION IF INDICATED
Anesthesia: General

## 2020-12-09 MED ORDER — SUGAMMADEX SODIUM 200 MG/2ML IV SOLN
INTRAVENOUS | Status: DC | PRN
  Administered 2020-12-09: 200 mg via INTRAVENOUS

## 2020-12-09 MED ORDER — PROPOFOL 10 MG/ML IV BOLUS
INTRAVENOUS | Status: AC
  Filled 2020-12-09: qty 20

## 2020-12-09 MED ORDER — SODIUM CHLORIDE 0.9 % IV SOLN: INTRAVENOUS | Status: DC

## 2020-12-09 MED ORDER — INDOMETHACIN 50 MG RE SUPP
RECTAL | Status: AC
  Filled 2020-12-09: qty 2

## 2020-12-09 MED ORDER — PROPOFOL 10 MG/ML IV BOLUS
INTRAVENOUS | Status: DC | PRN
  Administered 2020-12-09: 100 mg via INTRAVENOUS
  Administered 2020-12-09: 50 mg via INTRAVENOUS

## 2020-12-09 MED ORDER — GLUCAGON HCL RDNA (DIAGNOSTIC) 1 MG IJ SOLR
INTRAMUSCULAR | Status: AC
  Filled 2020-12-09: qty 2

## 2020-12-09 MED ORDER — INDOMETHACIN 50 MG RE SUPP
RECTAL | Status: DC | PRN
  Administered 2020-12-09: 100 mg via RECTAL

## 2020-12-09 MED ORDER — PHENYLEPHRINE HCL-NACL 10-0.9 MG/250ML-% IV SOLN
INTRAVENOUS | Status: DC | PRN
  Administered 2020-12-09: 40 ug/min via INTRAVENOUS

## 2020-12-09 MED ORDER — ONDANSETRON HCL 4 MG/2ML IJ SOLN
INTRAMUSCULAR | Status: DC | PRN
  Administered 2020-12-09: 4 mg via INTRAVENOUS

## 2020-12-09 MED ORDER — LIDOCAINE 2% (20 MG/ML) 5 ML SYRINGE
INTRAMUSCULAR | Status: DC | PRN
  Administered 2020-12-09: 60 mg via INTRAVENOUS

## 2020-12-09 MED ORDER — FENTANYL CITRATE (PF) 250 MCG/5ML IJ SOLN
INTRAMUSCULAR | Status: DC | PRN
  Administered 2020-12-09: 100 ug via INTRAVENOUS

## 2020-12-09 MED ORDER — LACTATED RINGERS IV SOLN: INTRAVENOUS | Status: DC

## 2020-12-09 MED ORDER — ROCURONIUM BROMIDE 10 MG/ML (PF) SYRINGE
PREFILLED_SYRINGE | INTRAVENOUS | Status: DC | PRN
  Administered 2020-12-09: 40 mg via INTRAVENOUS

## 2020-12-09 MED ORDER — DEXAMETHASONE SODIUM PHOSPHATE 10 MG/ML IJ SOLN
INTRAMUSCULAR | Status: DC | PRN
  Administered 2020-12-09: 4 mg via INTRAVENOUS

## 2020-12-09 MED ORDER — SODIUM CHLORIDE 0.9 % IV SOLN
INTRAVENOUS | Status: DC | PRN
  Administered 2020-12-09: 20 mL

## 2020-12-09 MED ORDER — FENTANYL CITRATE (PF) 100 MCG/2ML IJ SOLN
INTRAMUSCULAR | Status: AC
  Filled 2020-12-09: qty 2

## 2020-12-09 MED ORDER — CIPROFLOXACIN IN D5W 400 MG/200ML IV SOLN
INTRAVENOUS | Status: DC | PRN
  Administered 2020-12-09: 400 mg via INTRAVENOUS

## 2020-12-09 MED ORDER — CIPROFLOXACIN IN D5W 400 MG/200ML IV SOLN
INTRAVENOUS | Status: AC
  Filled 2020-12-09: qty 200

## 2020-12-09 NOTE — Anesthesia Procedure Notes (Signed)
Procedure Name: Intubation Performed by: Antonis Lor H, CRNA Pre-anesthesia Checklist: Patient identified, Emergency Drugs available, Suction available and Patient being monitored Patient Re-evaluated:Patient Re-evaluated prior to induction Oxygen Delivery Method: Circle System Utilized Preoxygenation: Pre-oxygenation with 100% oxygen Induction Type: IV induction Ventilation: Mask ventilation without difficulty Laryngoscope Size: Miller and 2 Grade View: Grade I Tube type: Oral Tube size: 7.0 mm Number of attempts: 1 Airway Equipment and Method: Stylet and Oral airway Placement Confirmation: ETT inserted through vocal cords under direct vision,  positive ETCO2 and breath sounds checked- equal and bilateral Secured at: 22 cm Tube secured with: Tape Dental Injury: Teeth and Oropharynx as per pre-operative assessment        

## 2020-12-09 NOTE — Op Note (Signed)
[facility] Patient Name: Tracey Mcclure Procedure Date: 12/09/2020 MRN: [id number] Attending MD: Arta Silence , MD Date of Birth: 11/27/1940 CSN: [id number] Age: 80 Admit Type: Outpatient Procedure:                ERCP Indications:              Bile leak Providers:                Arta Silence, MD, Baird Cancer, RN, Tyrone Apple, Technician, Lerry Paterson, CRNA Referring MD:              Medicines:                General Anesthesia, Indomethacin 100 mg PR, Cipro                            381 mg IV Complications:            No immediate complications. Estimated Blood Loss:     Estimated blood loss: none. Procedure:                Pre-Anesthesia Assessment:                           - Prior to the procedure, a History and Physical                            was performed, and patient medications and                            allergies were reviewed. The patient's tolerance of                            previous anesthesia was also reviewed. The risks                            and benefits of the procedure and the sedation                            options and risks were discussed with the patient.                            All questions were answered, and informed consent                            was obtained. Prior Anticoagulants: The patient has                            taken Eliquis (apixaban), last dose was 2 days                            prior to procedure. ASA Grade Assessment: III - A  patient with severe systemic disease. After                            reviewing the risks and benefits, the patient was                            deemed in satisfactory condition to undergo the                            procedure.                           After obtaining informed consent, the scope was                            passed under direct vision. Throughout the                            procedure,  the patient's blood pressure, pulse, and                            oxygen saturations were monitored continuously. The                            Olympus [id number]-[id number]) was introduced through                            the mouth, and used to inject contrast into and                            used to inject contrast into the bile duct. The                            ERCP was accomplished without difficulty. The                            patient tolerated the procedure well. Scope In: Scope Out: Findings:      A biliary stent was visible on the scout film. One stent was removed       from the common bile duct using a rat-toothed forceps. The bile duct was       deeply cannulated. Contrast was injected. I personally interpreted the       bile duct images. Ductal flow of contrast was adequate. The biliary tree       was otherwise normal. No evidence of filling defects in the bile duct,       biliary ductal dilatation or residual bile leak. Initial images       suggestive of leak, but then found to be contrast in the antrum and       duodenal bulb that was readily suctioned out with the duodenoscope.       Pancreatogram was not obtained (intentionally). Impression:               - One stent was removed from the common bile duct.                           -  Apparent healing of prior bile leak. Moderate Sedation:      Not Applicable - Patient had care per Anesthesia. Recommendation:           - Avoid aspirin and nonsteroidal anti-inflammatory                            medicines for 3 days.                           - Discharge patient to home (via wheelchair).                           - Soft diet today.                           - Continue present medications.                           - Resume Eliquis (apixaban) at prior dose today.                           - Return to GI clinic PRN. Procedure Code(s):        --- Professional ---                           [id number], Endoscopic  retrograde                            cholangiopancreatography (ERCP); with removal of                            foreign body(s) or stent(s) from biliary/pancreatic                            duct(s) Diagnosis Code(s):        --- Professional ---                           Y50.35, Encounter for fitting and adjustment of                            other gastrointestinal appliance and device                           K83.8, Other specified diseases of biliary tract CPT copyright 2019 American Medical Association. All rights reserved. The codes documented in this report are preliminary and upon coder review may  be revised to meet current compliance requirements. Arta Silence, MD 12/09/2020 10:47:06 AM This report has been signed electronically. Number of Addenda: 0

## 2020-12-09 NOTE — H&P (Signed)
[facility] Admission Note  Chief Complaint: bile leak  HPI: ADELIN VENTRELLA is an 80 y.o. female.  Presenting ERCP for stent removal and cholangiogram.  Asymptomatic today.  Past Medical History:  Diagnosis Date  . Atrial fibrillation ([place]) 12/05/2013  . Colon adenoma   . Gastric mass 10/14/2013  . GERD (gastroesophageal reflux disease)   . HTN (hypertension)    CONTROLLED  . Hypercholesteremia   . Hypertr obst cardiomyop   . Hypothyroidism   . Iron deficiency anemia, unspecified 10/14/2013  . 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  . Mitral insufficiency   . 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  . Obesity   . Palpitations     Past Surgical History:  Procedure Laterality Date  . ABDOMINAL HYSTERECTOMY    . BILIARY STENT PLACEMENT N/A 10/21/2020   Procedure: BILIARY STENT PLACEMENT;  Surgeon: Arta Silence, MD;  Location: [facility];  Service: Endoscopy;  Laterality: N/A;  . CHOLECYSTECTOMY N/A 10/16/2020   Procedure: LAPAROSCOPIC CHOLECYSTECTOMY WITH INTRAOPERATIVE CHOLANGIOGRAM;  Surgeon: Johnathan Hausen, MD;  Location: [facility];  Service: General;  Laterality: N/A;  . EP IMPLANTABLE DEVICE N/A 10/26/2015   Procedure: Pacemaker Implant;  Surgeon: Evans Lance, MD;  Location: [facility];  Service: Cardiovascular;  Laterality: N/A;  . ERCP N/A 10/21/2020   Procedure: ENDOSCOPIC RETROGRADE CHOLANGIOPANCREATOGRAPHY (ERCP);  Surgeon: Arta Silence, MD;  Location: [facility];  Service: Endoscopy;  Laterality: N/A;  . IR CHOLANGIOGRAM EXISTING TUBE  10/12/2020  . IR PERC CHOLECYSTOSTOMY  09/07/2020  . OVARY SURGERY     BENIGN TUMOR  . PACEMAKER REVISION N/A 09/01/2017   Procedure: PACEMAKER REVISION;  Surgeon: Constance Haw, MD;  Location: [facility];  Service: Cardiovascular;  Laterality: N/A;  . PANCREATIC STENT PLACEMENT  10/21/2020   Procedure: PANCREATIC STENT PLACEMENT;  Surgeon: Arta Silence, MD;   Location: [facility];  Service: Endoscopy;;  . Joan Mayans  10/21/2020   Procedure: SPHINCTEROTOMY;  Surgeon: Arta Silence, MD;  Location: [facility];  Service: Endoscopy;;    Medications Prior to Admission  Medication Sig Dispense Refill  . acetaminophen (TYLENOL) 325 MG tablet Take 650 mg by mouth every 6 (six) hours as needed for mild pain or headache.     [name] Kitchen apixaban (ELIQUIS) 5 MG TABS tablet Take 1 tablet (5 mg total) by mouth 2 (two) times daily.    [name] Kitchen atorvastatin (LIPITOR) 40 MG tablet TAKE 1 TABLET BY MOUTH EVERYDAY AT BEDTIME (Patient taking differently: Take 40 mg by mouth daily.) 90 tablet 3  . Cholecalciferol (VITAMIN D) 2000 units CAPS Take 2,000 Units by mouth daily.     . cyanocobalamin 1000 MCG tablet Take 1,000 mcg by mouth daily.    . hydrochlorothiazide (HYDRODIURIL) 25 MG tablet Take 0.5 tablets (12.5 mg total) by mouth daily. 90 tablet 2  . levothyroxine (SYNTHROID, LEVOTHROID) 125 MCG tablet Take 62.5-125 mcg by mouth See admin instructions. 158mcg daily EXCEPT 62.16mcg on sunday    . losartan (COZAAR) 25 MG tablet Take 25 mg by mouth at bedtime.    . ondansetron (ZOFRAN) 4 MG tablet Take 1 tablet (4 mg total) by mouth every 6 (six) hours as needed for nausea. 20 tablet 0  . Probiotic Product (PROBIOTIC DAILY PO) Take 1 tablet by mouth 2 (two) times daily.    . feeding supplement (ENSURE SURGERY) LIQD Take 237 mLs by mouth 2 (two) times daily between meals.      Allergies:  Allergies  Allergen  Reactions  . Dilaudid [Hydromorphone Hcl] Nausea And Vomiting    PT states she has uncontrolled nausea and vomiting   . Azithromycin Other (See Comments)    Patient states that she had severe stomach cramps for a solid 4 hours.     Family History  Problem Relation Age of Onset  . Heart attack Brother 92  . Prostate cancer Brother   . Breast cancer Sister 52    Social History:  reports that she has never smoked. She has never used smokeless tobacco. She reports  that she does not drink alcohol and does not use drugs.   ROS: As per HPI, all others negative   Blood pressure (!) 148/65, pulse 69, temperature 97.9 F (36.6 C), temperature source Oral, resp. rate 15, height 5\' 5"  (1.651 m), weight 74.8 kg, SpO2 100 %. General appearance: NAD HEENT:  Anicteric, [place]/AT CV:  Regular ABD:  Soft NEURO:  Non-focal  Assessment/Plan  1.  Bile leak, prior JP drain (removed), prior ERCP with stent. 2.  Follow-up CT scan 2 days ago, small indolent fluid collection < 2 cm; patient asymptomatic. 3.  ERCP with stent removal and cholangiogram to ensure no further leak.  Replace stent only if persistent leak seen. 4.  Risks (up to and including bleeding, infection, perforation, pancreatitis that can be complicated by infected necrosis and death), benefits (removal of stones, alleviating blockage, decreasing risk of cholangitis or choledocholithiasis-related pancreatitis), and alternatives (watchful waiting, percutaneous transhepatic cholangiography) of ERCP were explained to patient/family in detail and patient elects to proceed.  Landry Dyke 12/09/2020, 9:37 AM

## 2020-12-09 NOTE — Anesthesia Procedure Notes (Signed)
Performed by: Tandy Grawe H, CRNA       

## 2020-12-09 NOTE — Anesthesia Postprocedure Evaluation (Signed)
Anesthesia Post Note  Patient: Tracey Mcclure  Procedure(s) Performed: ENDOSCOPIC RETROGRADE CHOLANGIOPANCREATOGRAPHY (ERCP) with stent removal DO NOT USE ONE VIEW SCOPE (N/A ) STENT REMOVAL     Patient location during evaluation: Endoscopy Anesthesia Type: General Level of consciousness: awake and alert Pain management: pain level controlled Vital Signs Assessment: post-procedure vital signs reviewed and stable Respiratory status: spontaneous breathing, nonlabored ventilation and respiratory function stable Cardiovascular status: blood pressure returned to baseline and stable Postop Assessment: no apparent nausea or vomiting Anesthetic complications: no   No complications documented.  Last Vitals:  Vitals:   12/09/20 1120 12/09/20 1130  BP: (!) 155/72 (!) 158/84  Pulse: 60 60  Resp:  13  Temp:    SpO2: 100% 99%    Last Pain:  Vitals:   12/09/20 1130  TempSrc:   PainSc: 0-No pain                 Samayah Novinger,W. EDMOND

## 2020-12-09 NOTE — Transfer of Care (Signed)
Immediate Anesthesia Transfer of Care Note  Patient: Tracey Mcclure  Procedure(s) Performed: ENDOSCOPIC RETROGRADE CHOLANGIOPANCREATOGRAPHY (ERCP) with stent removal DO NOT USE ONE VIEW SCOPE (N/A ) STENT REMOVAL  Patient Location: Endoscopy Unit  Anesthesia Type:General  Level of Consciousness: drowsy  Airway & Oxygen Therapy: Patient Spontanous Breathing  Post-op Assessment: Report given to RN and Post -op Vital signs reviewed and stable  Post vital signs: Reviewed and stable  Last Vitals:  Vitals Value Taken Time  BP    Temp    Pulse    Resp    SpO2      Last Pain:  Vitals:   12/09/20 0857  TempSrc: Oral  PainSc: 1          Complications: No complications documented.

## 2020-12-09 NOTE — Anesthesia Preprocedure Evaluation (Addendum)
Anesthesia Evaluation  Patient identified by MRN, date of birth, ID band Patient awake    Reviewed: Allergy & Precautions, H&P , NPO status , Patient's Chart, lab work & pertinent test results  Airway Mallampati: III  TM Distance: >3 FB Neck ROM: Full    Dental no notable dental hx. (+) Teeth Intact, Dental Advisory Given   Pulmonary neg pulmonary ROS,    Pulmonary exam normal breath sounds clear to auscultation       Cardiovascular hypertension, Pt. on medications + dysrhythmias Atrial Fibrillation + pacemaker + Valvular Problems/Murmurs MR  Rhythm:Regular Rate:Normal     Neuro/Psych negative neurological ROS  negative psych ROS   GI/Hepatic Neg liver ROS, GERD  ,  Endo/Other  Hypothyroidism   Renal/GU negative Renal ROS  negative genitourinary   Musculoskeletal   Abdominal   Peds  Hematology  (+) Blood dyscrasia, anemia ,   Anesthesia Other Findings   Reproductive/Obstetrics negative OB ROS                            Anesthesia Physical Anesthesia Plan  ASA: III  Anesthesia Plan: General   Post-op Pain Management:    Induction: Intravenous  PONV Risk Score and Plan: 4 or greater and Ondansetron, Dexamethasone and Treatment may vary due to age or medical condition  Airway Management Planned: Oral ETT  Additional Equipment:   Intra-op Plan:   Post-operative Plan: Extubation in OR  Informed Consent: I have reviewed the patients History and Physical, chart, labs and discussed the procedure including the risks, benefits and alternatives for the proposed anesthesia with the patient or authorized representative who has indicated his/her understanding and acceptance.     Dental advisory given  Plan Discussed with: CRNA  Anesthesia Plan Comments:         Anesthesia Quick Evaluation

## 2020-12-09 NOTE — Discharge Instructions (Signed)

## 2020-12-10 ENCOUNTER — Encounter (HOSPITAL_COMMUNITY): Payer: Self-pay | Admitting: Gastroenterology

## 2020-12-16 NOTE — Progress Notes (Signed)
Remote pacemaker transmission.   

## 2021-01-18 ENCOUNTER — Other Ambulatory Visit: Payer: Self-pay | Admitting: Cardiology

## 2021-01-18 NOTE — Telephone Encounter (Signed)
74F, 77.3KG, SCR 0.86 10/22/20, LOVW/JORDAN 10/08/20

## 2021-02-04 DIAGNOSIS — D0462 Carcinoma in situ of skin of left upper limb, including shoulder: Secondary | ICD-10-CM | POA: Diagnosis not present

## 2021-02-04 DIAGNOSIS — L570 Actinic keratosis: Secondary | ICD-10-CM | POA: Diagnosis not present

## 2021-02-04 DIAGNOSIS — Z85828 Personal history of other malignant neoplasm of skin: Secondary | ICD-10-CM | POA: Diagnosis not present

## 2021-02-04 DIAGNOSIS — L821 Other seborrheic keratosis: Secondary | ICD-10-CM | POA: Diagnosis not present

## 2021-02-04 DIAGNOSIS — L814 Other melanin hyperpigmentation: Secondary | ICD-10-CM | POA: Diagnosis not present

## 2021-02-23 ENCOUNTER — Other Ambulatory Visit: Payer: Self-pay

## 2021-02-23 ENCOUNTER — Other Ambulatory Visit (HOSPITAL_BASED_OUTPATIENT_CLINIC_OR_DEPARTMENT_OTHER): Payer: Self-pay

## 2021-02-23 ENCOUNTER — Ambulatory Visit: Payer: Medicare Other | Attending: Internal Medicine

## 2021-02-23 DIAGNOSIS — Z23 Encounter for immunization: Secondary | ICD-10-CM

## 2021-02-23 MED ORDER — PFIZER-BIONT COVID-19 VAC-TRIS 30 MCG/0.3ML IM SUSP
INTRAMUSCULAR | 0 refills | Status: DC
  Filled 2021-02-23: qty 0.3, 1d supply, fill #0

## 2021-02-23 NOTE — Progress Notes (Signed)
   Covid-19 Vaccination Clinic  Name:  Tracey Mcclure    MRN: [id number] DOB: [birth year]  02/23/2021  Tracey Mcclure was observed post Covid-19 immunization for 15 minutes without incident. She was provided with Vaccine Information Sheet and instruction to access the V-Safe system.   Tracey Mcclure was instructed to call 911 with any severe reactions post vaccine: Difficulty breathing  Swelling of face and throat  A fast heartbeat  A bad rash all over body  Dizziness and weakness   Immunizations Administered     Name Date Dose VIS Date Route   PFIZER Comrnaty(Gray TOP) Covid-19 Vaccine 02/23/2021  2:16 PM 0.3 mL 07/16/2020 Intramuscular   Manufacturer: [employer]   Lot: [id number]   [place]: [phone]

## 2021-03-01 ENCOUNTER — Ambulatory Visit (INDEPENDENT_AMBULATORY_CARE_PROVIDER_SITE_OTHER): Payer: Medicare Other

## 2021-03-01 DIAGNOSIS — I441 Atrioventricular block, second degree: Secondary | ICD-10-CM

## 2021-03-02 LAB — CUP PACEART REMOTE DEVICE CHECK
Battery Remaining Longevity: 60 mo
Battery Voltage: 2.99 V
Brady Statistic RV Percent Paced: 97.72 %
Date Time Interrogation Session: 20220725123951
Implantable Lead Implant Date: 20170320
Implantable Lead Location: 753860
Implantable Lead Model: 5076
Implantable Pulse Generator Implant Date: 20170320
Lead Channel Impedance Value: 399 Ohm
Lead Channel Impedance Value: 437 Ohm
Lead Channel Pacing Threshold Amplitude: 0.625 V
Lead Channel Pacing Threshold Pulse Width: 0.4 ms
Lead Channel Sensing Intrinsic Amplitude: 14.625 mV
Lead Channel Sensing Intrinsic Amplitude: 14.625 mV
Lead Channel Setting Pacing Amplitude: 2 V
Lead Channel Setting Pacing Pulse Width: 0.4 ms
Lead Channel Setting Sensing Sensitivity: 2 mV

## 2021-03-23 NOTE — Progress Notes (Signed)
Remote pacemaker transmission.   

## 2021-04-09 DIAGNOSIS — Z1231 Encounter for screening mammogram for malignant neoplasm of breast: Secondary | ICD-10-CM | POA: Diagnosis not present

## 2021-04-19 DIAGNOSIS — M859 Disorder of bone density and structure, unspecified: Secondary | ICD-10-CM | POA: Diagnosis not present

## 2021-04-19 DIAGNOSIS — R7301 Impaired fasting glucose: Secondary | ICD-10-CM | POA: Diagnosis not present

## 2021-04-19 DIAGNOSIS — E039 Hypothyroidism, unspecified: Secondary | ICD-10-CM | POA: Diagnosis not present

## 2021-04-19 DIAGNOSIS — E785 Hyperlipidemia, unspecified: Secondary | ICD-10-CM | POA: Diagnosis not present

## 2021-04-19 DIAGNOSIS — M8589 Other specified disorders of bone density and structure, multiple sites: Secondary | ICD-10-CM | POA: Diagnosis not present

## 2021-04-26 DIAGNOSIS — I517 Cardiomegaly: Secondary | ICD-10-CM | POA: Diagnosis not present

## 2021-04-26 DIAGNOSIS — D472 Monoclonal gammopathy: Secondary | ICD-10-CM | POA: Diagnosis not present

## 2021-04-26 DIAGNOSIS — I129 Hypertensive chronic kidney disease with stage 1 through stage 4 chronic kidney disease, or unspecified chronic kidney disease: Secondary | ICD-10-CM | POA: Diagnosis not present

## 2021-04-26 DIAGNOSIS — Z1331 Encounter for screening for depression: Secondary | ICD-10-CM | POA: Diagnosis not present

## 2021-04-26 DIAGNOSIS — D6869 Other thrombophilia: Secondary | ICD-10-CM | POA: Diagnosis not present

## 2021-04-26 DIAGNOSIS — Z23 Encounter for immunization: Secondary | ICD-10-CM | POA: Diagnosis not present

## 2021-04-26 DIAGNOSIS — I4891 Unspecified atrial fibrillation: Secondary | ICD-10-CM | POA: Diagnosis not present

## 2021-04-26 DIAGNOSIS — N1831 Chronic kidney disease, stage 3a: Secondary | ICD-10-CM | POA: Diagnosis not present

## 2021-04-26 DIAGNOSIS — Z950 Presence of cardiac pacemaker: Secondary | ICD-10-CM | POA: Diagnosis not present

## 2021-04-26 DIAGNOSIS — Z0000 Encounter for general adult medical examination without abnormal findings: Secondary | ICD-10-CM | POA: Diagnosis not present

## 2021-04-26 DIAGNOSIS — I340 Nonrheumatic mitral (valve) insufficiency: Secondary | ICD-10-CM | POA: Diagnosis not present

## 2021-04-26 DIAGNOSIS — M8580 Other specified disorders of bone density and structure, unspecified site: Secondary | ICD-10-CM | POA: Diagnosis not present

## 2021-04-26 DIAGNOSIS — Z7901 Long term (current) use of anticoagulants: Secondary | ICD-10-CM | POA: Diagnosis not present

## 2021-04-26 DIAGNOSIS — R82998 Other abnormal findings in urine: Secondary | ICD-10-CM | POA: Diagnosis not present

## 2021-04-26 DIAGNOSIS — Z1339 Encounter for screening examination for other mental health and behavioral disorders: Secondary | ICD-10-CM | POA: Diagnosis not present

## 2021-04-26 DIAGNOSIS — Z1212 Encounter for screening for malignant neoplasm of rectum: Secondary | ICD-10-CM | POA: Diagnosis not present

## 2021-04-26 DIAGNOSIS — I10 Essential (primary) hypertension: Secondary | ICD-10-CM | POA: Diagnosis not present

## 2021-04-28 ENCOUNTER — Ambulatory Visit: Payer: Medicare Other | Attending: Internal Medicine

## 2021-04-28 ENCOUNTER — Other Ambulatory Visit (HOSPITAL_BASED_OUTPATIENT_CLINIC_OR_DEPARTMENT_OTHER): Payer: Self-pay

## 2021-04-28 DIAGNOSIS — Z23 Encounter for immunization: Secondary | ICD-10-CM

## 2021-04-28 MED ORDER — PFIZER COVID-19 VAC BIVALENT 30 MCG/0.3ML IM SUSP
INTRAMUSCULAR | 0 refills | Status: DC
  Filled 2021-04-28: qty 0.3, 1d supply, fill #0

## 2021-04-28 NOTE — Progress Notes (Signed)
   Covid-19 Vaccination Clinic  Name:  Tracey Mcclure    MRN: [id number] DOB: 27-Feb-1941  04/28/2021  Ms. Tracey Mcclure was observed post Covid-19 immunization for 15 minutes without incident. She was provided with Vaccine Information Sheet and instruction to access the V-Safe system.   Ms. Tracey Mcclure was instructed to call 911 with any severe reactions post vaccine: Difficulty breathing  Swelling of face and throat  A fast heartbeat  A bad rash all over body  Dizziness and weakness

## 2021-05-18 DIAGNOSIS — H43813 Vitreous degeneration, bilateral: Secondary | ICD-10-CM | POA: Diagnosis not present

## 2021-05-18 DIAGNOSIS — H349 Unspecified retinal vascular occlusion: Secondary | ICD-10-CM | POA: Diagnosis not present

## 2021-05-18 DIAGNOSIS — H524 Presbyopia: Secondary | ICD-10-CM | POA: Diagnosis not present

## 2021-05-18 DIAGNOSIS — H26492 Other secondary cataract, left eye: Secondary | ICD-10-CM | POA: Diagnosis not present

## 2021-05-21 NOTE — Progress Notes (Signed)
Tracey Mcclure Date of Birth: 06-Aug-1941   History of Present Illness: Tracey Mcclure is seen today for followup of atrial fibrillation and bradycardia. She has a history of hypertrophic obstructive cardiomyopathy, hypertension, and Mobitz type I second-degree AV block. When seen in April 2015 she was noted to be in atrial fibrillation with a slow ventricular response of 53. She had only mild palpitations. Verapamil was discontinued. 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 Prior extensive GI work up for heme positive stool and iron deficiency anemia was unrevealing.   She had a Holter monitor in august 2016 that showed sinus rhythm with PACs and PVCs. Some intermittent second degree AV block. Mean HR 65 with slowest HR 31 and longest pause of 3.1 seconds. She was seen by Rosaria Ferries PA. At that time Ecg showed AFib with rate 43 bpm. She complained of some intermittent edema and fatigue.  She was seen by Dr. Lovena Le and underwent PPM placement in March 2017.  In January she presented with a lead fracture and had lead revision on 09/01/17.  Last pacemaker check in Jan was good.   On a prior visit we stopped her metoprolol because her BP was low and she felt fatigued. She did note a significant improvement in her fatigue. BP readings from home looked very good thru September. Echo in October 2021 showed septal hypertrophy without outflow gradient. Moderate MR.  Last pacer check in July showed normal function.  She was admitted at the end of January with acute cholecystitis. Had IR drain placed. HCTZ initially held due to hyponatremia but this improved and it was resumed at DC. She had drain removed in May. States she has felt very well since. Denies any chest pain, dyspnea, or edema. Eats 2 meals a day. Weight is down a little. Rare palpitations.   Current Outpatient Medications on File Prior to Visit  Medication Sig Dispense Refill   acetaminophen (TYLENOL) 325 MG tablet Take 650 mg by mouth  every 6 (six) hours as needed for mild pain or headache.      atorvastatin (LIPITOR) 40 MG tablet TAKE 1 TABLET BY MOUTH EVERYDAY AT BEDTIME (Patient taking differently: Take 40 mg by mouth daily.) 90 tablet 3   Cholecalciferol (VITAMIN D) 2000 units CAPS Take 2,000 Units by mouth daily.      cyanocobalamin 1000 MCG tablet Take 1,000 mcg by mouth daily.     ELIQUIS 5 MG TABS tablet TAKE 1 TABLET BY MOUTH TWICE A DAY 180 tablet 1   feeding supplement (ENSURE SURGERY) LIQD Take 237 mLs by mouth 2 (two) times daily between meals.     hydrochlorothiazide (HYDRODIURIL) 25 MG tablet Take 0.5 tablets (12.5 mg total) by mouth daily. 90 tablet 2   levothyroxine (SYNTHROID, LEVOTHROID) 125 MCG tablet Take 62.5-125 mcg by mouth See admin instructions. 183mcg daily EXCEPT 62.71mcg on sunday     losartan (COZAAR) 25 MG tablet Take 25 mg by mouth at bedtime.     ondansetron (ZOFRAN) 4 MG tablet Take 1 tablet (4 mg total) by mouth every 6 (six) hours as needed for nausea. 20 tablet 0   Probiotic Product (PROBIOTIC DAILY PO) Take 1 tablet by mouth 2 (two) times daily.     No current facility-administered medications on file prior to visit.    Allergies  Allergen Reactions   Dilaudid [Hydromorphone Hcl] Nausea And Vomiting    PT states she has uncontrolled nausea and vomiting    Azithromycin Other (See Comments)  Patient states that she had severe stomach cramps for a solid 4 hours.     Past Medical History:  Diagnosis Date   Atrial fibrillation ([place]) 12/05/2013   Colon adenoma    Gastric mass 10/14/2013   GERD (gastroesophageal reflux disease)    HTN (hypertension)    CONTROLLED   Hypercholesteremia    Hypertr obst cardiomyop    Hypothyroidism    Iron deficiency anemia, unspecified 10/14/2013   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   Mitral insufficiency    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   Obesity    Palpitations     Past Surgical History:   Procedure Laterality Date   ABDOMINAL HYSTERECTOMY     BILIARY STENT PLACEMENT N/A 10/21/2020   Procedure: BILIARY STENT PLACEMENT;  Surgeon: Arta Silence, MD;  Location: [facility];  Service: Endoscopy;  Laterality: N/A;   CHOLECYSTECTOMY N/A 10/16/2020   Procedure: LAPAROSCOPIC CHOLECYSTECTOMY WITH INTRAOPERATIVE CHOLANGIOGRAM;  Surgeon: Johnathan Hausen, MD;  Location: [facility];  Service: General;  Laterality: N/A;   EP IMPLANTABLE DEVICE N/A 10/26/2015   Procedure: Pacemaker Implant;  Surgeon: Evans Lance, MD;  Location: [facility];  Service: Cardiovascular;  Laterality: N/A;   ERCP N/A 10/21/2020   Procedure: ENDOSCOPIC RETROGRADE CHOLANGIOPANCREATOGRAPHY (ERCP);  Surgeon: Arta Silence, MD;  Location: [facility];  Service: Endoscopy;  Laterality: N/A;   ERCP N/A 12/09/2020   Procedure: ENDOSCOPIC RETROGRADE CHOLANGIOPANCREATOGRAPHY (ERCP) with stent removal DO NOT USE ONE VIEW SCOPE;  Surgeon: Arta Silence, MD;  Location: [facility];  Service: Endoscopy;  Laterality: N/A;   IR CHOLANGIOGRAM EXISTING TUBE  10/12/2020   IR PERC CHOLECYSTOSTOMY  09/07/2020   OVARY SURGERY     BENIGN TUMOR   PACEMAKER REVISION N/A 09/01/2017   Procedure: PACEMAKER REVISION;  Surgeon: Constance Haw, MD;  Location: [facility];  Service: Cardiovascular;  Laterality: N/A;   PANCREATIC STENT PLACEMENT  10/21/2020   Procedure: PANCREATIC STENT PLACEMENT;  Surgeon: Arta Silence, MD;  Location: [facility];  Service: Endoscopy;;   SPHINCTEROTOMY  10/21/2020   Procedure: Joan Mayans;  Surgeon: Arta Silence, MD;  Location: [facility];  Service: Endoscopy;;   STENT REMOVAL  12/09/2020   Procedure: STENT REMOVAL;  Surgeon: Arta Silence, MD;  Location: [facility];  Service: Endoscopy;;  biliary    Social History   Tobacco Use  Smoking Status Never  Smokeless Tobacco Never    Social History   Substance and Sexual Activity  Alcohol Use No    Family History  Problem  Relation Age of Onset   Heart attack Brother 71   Prostate cancer Brother    Breast cancer Sister 49    Review of Systems: As noted in history of present illness.All other systems were reviewed and are negative.  Physical Exam: BP 132/82   Pulse 86   Ht 5' 4.5" (1.638 m)   Wt 164 lb 12.8 oz (74.8 kg)   LMP  (LMP Unknown)   SpO2 97%   BMI 27.85 kg/m  GENERAL:  Well appearing WF in NAD HEENT:  PERRL, EOMI, sclera are clear. Oropharynx is clear. NECK:  No jugular venous distention, carotid upstroke brisk and symmetric, no bruits, no thyromegaly or adenopathy LUNGS:  Clear to auscultation bilaterally CHEST:  Unremarkable. Pacer site looks OK.  HEART:  RRR,  PMI not displaced or sustained,S1 and S2 within normal limits, no S3, no S4: no clicks, no rubs, gr 2/6 systolic murmur across the precordium.  ABD:  Soft, nontender. BS +, no masses or bruits. No hepatomegaly, no splenomegaly EXT:  2 + pulses throughout, no edema SKIN:  Warm and dry.  No rashes NEURO:  Alert and oriented x 3. Cranial nerves II through XII intact. PSYCH:  Cognitively intact    LABORATORY DATA: Lab Results  Component Value Date   WBC 8.0 10/23/2020   HGB 10.6 (L) 10/23/2020   HCT 31.3 (L) 10/23/2020   PLT 356 10/23/2020   GLUCOSE 96 10/22/2020   ALT 29 10/22/2020   AST 22 10/22/2020   NA 132 (L) 10/22/2020   K 4.0 10/22/2020   CL 100 10/22/2020   CREATININE 0.86 10/22/2020   BUN 10 10/22/2020   CO2 23 10/22/2020   TSH 8.431 (H) 10/16/2020   INR 1.6 (H) 10/13/2020   Labs dated 11/30/15: cholesterol 165, triglycerides 97, LDL 76, HDL 70. BMET and TSH normal.  Dated 12/19/16: cholesterol 148, triglycerides 90, HDL 65, LDL 65. BMET and TSH normal. A1c 5.5%.  Dated 01/29/18: cholesterol 148, triglycerides 99, HDL 65, LDL 63. Dated 03/11/19: cholesterol 148, triglycerides 104, HDL 63, LDL 64, A1c 5.3%. CMET, CBC, TSH normal. Dated 04/07/20: cholesterol 151,triglycerides 116, HDL 66, LDL 62. A1c 5.2%. CBC,  TFTs, CMET normal. Dated 10/23/20: Hgb 10.6 Dated 04/19/21: cholesterol 152, triglycerides 86, HDL 76, LDL 59. A1c 5.2%. CMET and TSH normal.  Echo: 05/18/20: IMPRESSIONS     1. Left ventricular ejection fraction, by estimation, is 65 to 70%. The  left ventricle has normal function. The left ventricle has no regional  wall motion abnormalities. There is severe asymmetric left ventricular  hypertrophy of the basal-septal  segment. No significant LVOT gradient at rest. Left ventricular diastolic  parameters are indeterminate.   2. Right ventricular systolic function is normal. The right ventricular  size is normal. There is mildly elevated pulmonary artery systolic  pressure. The estimated right ventricular systolic pressure is 94.4 mmHg.   3. Left atrial size was severely dilated.   4. The mitral valve is abnormal. Moderate mitral valve regurgitation. No  evidence of mitral stenosis. Severe mitral annular calcification.   5. Tricuspid valve regurgitation is moderate.   6. The aortic valve is tricuspid. Aortic valve regurgitation is trivial.  No aortic stenosis is present.   7. Aortic dilatation noted. There is dilatation of the ascending aorta,  measuring 43 mm.   8. The inferior vena cava is normal in size with greater than 50%  respiratory variabi  Assessment / Plan: 1. Atrial fibrillation- paroxysmal- with slow ventricular response.  History of sinus bradycardia with second-degree Mobitz type I AV block.  CHAD-Vasc score of 3-4. On Eliquis for anticoagulation. s/p PPM- revised in January 2019 for lead fracture. Functioning well now. HR is controlled on no AV nodal blocking agents.   2. Hypertrophic cardiomyopathy. Echocardiogram done  October 2021 without outflow gradeient. Normal EF. Off Toprol due to fatigue and low BP.   3. Mitral insufficiency-mod  related to her hypertrophic cardiomyopathy.   4. Hypertension- BP is well controlled.   5. MGUS- followed yearly by hematology. No  change.  6. Acute cholecystitis. S/p perc drain later removed. Asymptomatic.

## 2021-05-25 ENCOUNTER — Encounter: Payer: Self-pay | Admitting: Cardiology

## 2021-05-25 ENCOUNTER — Ambulatory Visit (INDEPENDENT_AMBULATORY_CARE_PROVIDER_SITE_OTHER): Payer: Medicare Other | Admitting: Cardiology

## 2021-05-25 ENCOUNTER — Other Ambulatory Visit: Payer: Self-pay

## 2021-05-25 VITALS — BP 132/82 | HR 86 | Ht 64.5 in | Wt 164.8 lb

## 2021-05-25 DIAGNOSIS — I421 Obstructive hypertrophic cardiomyopathy: Secondary | ICD-10-CM

## 2021-05-25 DIAGNOSIS — Z950 Presence of cardiac pacemaker: Secondary | ICD-10-CM | POA: Diagnosis not present

## 2021-05-25 DIAGNOSIS — I480 Paroxysmal atrial fibrillation: Secondary | ICD-10-CM

## 2021-05-25 DIAGNOSIS — I441 Atrioventricular block, second degree: Secondary | ICD-10-CM | POA: Diagnosis not present

## 2021-05-25 DIAGNOSIS — I10 Essential (primary) hypertension: Secondary | ICD-10-CM

## 2021-05-25 NOTE — Patient Instructions (Signed)
Medication Instructions:  Continue same medications *If you need a refill on your cardiac medications before your next appointment, please call your pharmacy*   Lab Work: None ordered   Testing/Procedures: None ordered   Follow-Up: At [facility], you and your health needs are our priority.  As part of our continuing mission to provide you with exceptional heart care, we have created designated Provider Care Teams.  These Care Teams include your primary Cardiologist (physician) and Advanced Practice Providers (APPs -  Physician Assistants and Nurse Practitioners) who all work together to provide you with the care you need, when you need it.  We recommend signing up for the patient portal called "MyChart".  Sign up information is provided on this After Visit Summary.  MyChart is used to connect with patients for Virtual Visits (Telemedicine).  Patients are able to view lab/test results, encounter notes, upcoming appointments, etc.  Non-urgent messages can be sent to your provider as well.   To learn more about what you can do with MyChart, go to [email or web address].     Your next appointment:  6 months   Call in Jan to schedule April appointment     The format for your next appointment: Office   Provider:  Dr.Jordan

## 2021-05-26 DIAGNOSIS — Z85828 Personal history of other malignant neoplasm of skin: Secondary | ICD-10-CM | POA: Diagnosis not present

## 2021-05-26 DIAGNOSIS — D0462 Carcinoma in situ of skin of left upper limb, including shoulder: Secondary | ICD-10-CM | POA: Diagnosis not present

## 2021-05-31 ENCOUNTER — Ambulatory Visit (INDEPENDENT_AMBULATORY_CARE_PROVIDER_SITE_OTHER): Payer: Medicare Other

## 2021-05-31 DIAGNOSIS — I441 Atrioventricular block, second degree: Secondary | ICD-10-CM | POA: Diagnosis not present

## 2021-05-31 LAB — CUP PACEART REMOTE DEVICE CHECK
Battery Remaining Longevity: 53 mo
Battery Voltage: 2.98 V
Brady Statistic RV Percent Paced: 97.77 %
Date Time Interrogation Session: 20221024115337
Implantable Lead Implant Date: 20170320
Implantable Lead Location: 753860
Implantable Lead Model: 5076
Implantable Pulse Generator Implant Date: 20170320
Lead Channel Impedance Value: 437 Ohm
Lead Channel Impedance Value: 456 Ohm
Lead Channel Pacing Threshold Amplitude: 0.75 V
Lead Channel Pacing Threshold Pulse Width: 0.4 ms
Lead Channel Sensing Intrinsic Amplitude: 13.5 mV
Lead Channel Sensing Intrinsic Amplitude: 13.5 mV
Lead Channel Setting Pacing Amplitude: 2 V
Lead Channel Setting Pacing Pulse Width: 0.4 ms
Lead Channel Setting Sensing Sensitivity: 2 mV

## 2021-06-08 NOTE — Progress Notes (Signed)
Remote pacemaker transmission.   

## 2021-06-21 DIAGNOSIS — G3184 Mild cognitive impairment, so stated: Secondary | ICD-10-CM | POA: Diagnosis not present

## 2021-06-21 DIAGNOSIS — G4700 Insomnia, unspecified: Secondary | ICD-10-CM | POA: Diagnosis not present

## 2021-06-21 DIAGNOSIS — R413 Other amnesia: Secondary | ICD-10-CM | POA: Diagnosis not present

## 2021-06-21 DIAGNOSIS — F4320 Adjustment disorder, unspecified: Secondary | ICD-10-CM | POA: Diagnosis not present

## 2021-07-06 ENCOUNTER — Encounter (HOSPITAL_BASED_OUTPATIENT_CLINIC_OR_DEPARTMENT_OTHER): Payer: Self-pay | Admitting: Emergency Medicine

## 2021-07-06 ENCOUNTER — Emergency Department (HOSPITAL_BASED_OUTPATIENT_CLINIC_OR_DEPARTMENT_OTHER)
Admission: EM | Admit: 2021-07-06 | Discharge: 2021-07-06 | Disposition: A | Discharge status: Home / Self Care | Payer: Medicare Other | Attending: Emergency Medicine | Admitting: Emergency Medicine

## 2021-07-06 ENCOUNTER — Emergency Department (HOSPITAL_BASED_OUTPATIENT_CLINIC_OR_DEPARTMENT_OTHER): Payer: Medicare Other | Admitting: Radiology

## 2021-07-06 ENCOUNTER — Other Ambulatory Visit: Payer: Self-pay

## 2021-07-06 ENCOUNTER — Emergency Department (HOSPITAL_BASED_OUTPATIENT_CLINIC_OR_DEPARTMENT_OTHER): Payer: Medicare Other

## 2021-07-06 DIAGNOSIS — S82115A Nondisplaced fracture of left tibial spine, initial encounter for closed fracture: Secondary | ICD-10-CM | POA: Diagnosis not present

## 2021-07-06 DIAGNOSIS — Z950 Presence of cardiac pacemaker: Secondary | ICD-10-CM | POA: Insufficient documentation

## 2021-07-06 DIAGNOSIS — S60011A Contusion of right thumb without damage to nail, initial encounter: Secondary | ICD-10-CM | POA: Insufficient documentation

## 2021-07-06 DIAGNOSIS — E039 Hypothyroidism, unspecified: Secondary | ICD-10-CM | POA: Insufficient documentation

## 2021-07-06 DIAGNOSIS — S82142A Displaced bicondylar fracture of left tibia, initial encounter for closed fracture: Secondary | ICD-10-CM | POA: Diagnosis not present

## 2021-07-06 DIAGNOSIS — Z79899 Other long term (current) drug therapy: Secondary | ICD-10-CM | POA: Diagnosis not present

## 2021-07-06 DIAGNOSIS — W19XXXA Unspecified fall, initial encounter: Secondary | ICD-10-CM | POA: Diagnosis not present

## 2021-07-06 DIAGNOSIS — S8992XA Unspecified injury of left lower leg, initial encounter: Secondary | ICD-10-CM | POA: Diagnosis present

## 2021-07-06 DIAGNOSIS — I10 Essential (primary) hypertension: Secondary | ICD-10-CM | POA: Diagnosis not present

## 2021-07-06 DIAGNOSIS — Z7901 Long term (current) use of anticoagulants: Secondary | ICD-10-CM | POA: Insufficient documentation

## 2021-07-06 DIAGNOSIS — M25562 Pain in left knee: Secondary | ICD-10-CM | POA: Diagnosis not present

## 2021-07-06 DIAGNOSIS — M79641 Pain in right hand: Secondary | ICD-10-CM | POA: Diagnosis not present

## 2021-07-06 NOTE — ED Provider Notes (Signed)
[facility] EMERGENCY DEPT Provider Note   CSN: [id number] Arrival date & time: 07/06/21  1332     History Chief Complaint  Patient presents with   Tracey Mcclure is a 80 y.o. female who presents the emergency department with left knee pain and right hand pain after a mechanical trip and fall that occurred just prior to arrival.  Patient was walking out of a restaurant with her hands full when she missed the step and fell forward onto her left knee.  She denies any other injury.  She did not hit her head or lose consciousness.  The pain is been constant since injury and worse with any movement.  Currently she rates her knee pain moderate in severity.  Patient is anticoagulated on Eliquis.   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   Obesity    Palpitations     Patient Active Problem List   Diagnosis Date Noted   Memory difficulty 10/17/2020   Chronic anticoagulation 10/17/2020   Choledocholithiasis 10/14/2020   Transaminitis 10/13/2020   Hyponatremia    Hypertrophic cardiomyopathy ([place])    Aortic dilatation ([place])    Acute calculous cholecystitis s/p cholecystectomy with oversew cystic duct 10/16/2020 09/05/2020   Hypokalemia    Pacemaker 10/01/2019   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   Sinus bradycardia 03/30/2015   Atrial fibrillation ([place]) 12/05/2013   Gastric mass 10/14/2013   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    Hypertrophic obstructive cardiomyopathy ([place])    Mitral insufficiency    HTN (hypertension)    Palpitations    Hypercholesteremia    Hypothyroidism    GERD (gastroesophageal reflux disease)    Obesity    Colon adenoma     Past Surgical History:  Procedure Laterality Date   ABDOMINAL HYSTERECTOMY     BILIARY STENT PLACEMENT N/A 10/21/2020   Procedure: BILIARY STENT PLACEMENT;  Surgeon: Arta Silence, MD;  Location: [facility];  Service: Endoscopy;  Laterality: N/A;   CHOLECYSTECTOMY N/A 10/16/2020   Procedure: LAPAROSCOPIC CHOLECYSTECTOMY WITH INTRAOPERATIVE CHOLANGIOGRAM;  Surgeon: Johnathan Hausen, MD;  Location: [facility];  Service: General;  Laterality: N/A;   EP IMPLANTABLE DEVICE N/A 10/26/2015   Procedure: Pacemaker Implant;  Surgeon: Evans Lance, MD;  Location: [facility];  Service: Cardiovascular;  Laterality: N/A;   ERCP N/A 10/21/2020   Procedure: ENDOSCOPIC RETROGRADE CHOLANGIOPANCREATOGRAPHY (ERCP);  Surgeon: Arta Silence, MD;  Location: [facility];  Service: Endoscopy;  Laterality: N/A;   ERCP N/A 12/09/2020   Procedure: ENDOSCOPIC RETROGRADE CHOLANGIOPANCREATOGRAPHY (ERCP) with stent removal DO NOT USE ONE VIEW SCOPE;  Surgeon: Arta Silence, MD;  Location: [facility];  Service: Endoscopy;  Laterality: N/A;   IR CHOLANGIOGRAM EXISTING TUBE  10/12/2020   IR PERC CHOLECYSTOSTOMY  09/07/2020   OVARY SURGERY     BENIGN TUMOR   PACEMAKER REVISION N/A 09/01/2017   Procedure: PACEMAKER REVISION;  Surgeon: Constance Haw, MD;  Location: [facility];  Service: Cardiovascular;  Laterality: N/A;   PANCREATIC STENT PLACEMENT  10/21/2020   Procedure: PANCREATIC STENT PLACEMENT;  Surgeon: Arta Silence, MD;  Location: [facility];  Service: Endoscopy;;   SPHINCTEROTOMY  10/21/2020   Procedure: Joan Mayans;  Surgeon: Arta Silence, MD;  Location: [facility];  Service: Endoscopy;;   STENT REMOVAL  12/09/2020   Procedure: STENT REMOVAL;  Surgeon: Arta Silence, MD;  Location: [facility];  Service: Endoscopy;;  biliary     OB History   No obstetric history on file.     Family History  Problem Relation Age of Onset   Heart attack Brother 9   Prostate cancer Brother    Breast cancer Sister 74    Social History   Tobacco Use   Smoking status: Never   Smokeless tobacco: Never  Vaping Use   Vaping Use: Never used  Substance Use Topics   Alcohol use: No   Drug use: No    Home Medications Prior to Admission medications   Medication Sig Start Date End Date Taking? Authorizing Provider  acetaminophen (TYLENOL) 325 MG tablet Take 650 mg by mouth every 6 (six) hours as needed for mild pain or headache.    Yes [provider]  atorvastatin (LIPITOR) 40 MG tablet TAKE 1 TABLET BY MOUTH EVERYDAY AT BEDTIME Patient taking differently: Take 40 mg by mouth daily. 08/12/20  Yes [place], Peter M, MD  Cholecalciferol (VITAMIN D) 2000 units CAPS Take 2,000 Units by mouth daily.    Yes [provider]  cyanocobalamin 1000 MCG tablet Take 1,000 mcg by mouth daily.   Yes [provider]  ELIQUIS 5 MG TABS tablet TAKE 1 TABLET BY MOUTH TWICE A DAY 01/18/21  Yes [place], Peter M, MD  hydrochlorothiazide (HYDRODIURIL) 25 MG tablet Take 0.5 tablets (12.5 mg total) by mouth daily. 06/06/16  Yes [place], Peter M, MD  levothyroxine (SYNTHROID, LEVOTHROID) 125 MCG tablet Take 62.5-125 mcg by mouth See admin instructions. 149mcg daily EXCEPT 62.87mcg on sunday   Yes [provider]  losartan (COZAAR) 25 MG tablet Take 25 mg by mouth at bedtime.   Yes [provider]  Probiotic Product (PROBIOTIC DAILY PO) Take 1 tablet by mouth daily.   Yes [provider]  feeding supplement (ENSURE SURGERY) LIQD Take 237 mLs by mouth 2 (two) times daily between meals. Patient not taking: Reported on 07/06/2021 10/23/20   Mariel Aloe, MD  ondansetron (ZOFRAN) 4 MG tablet Take 1 tablet (4 mg total) by mouth every 6 (six)  hours as needed for nausea. Patient not taking: Reported on 07/06/2021 09/09/20   Eugenie Filler, MD    Allergies    Dilaudid [hydromorphone hcl], Azithromycin, and Pravastatin  Review of Systems   Review of Systems  All other systems reviewed and are negative.  Physical Exam Updated Vital Signs BP (!) 175/87 (BP Location: Left Arm)   Pulse 63   Temp 98.5 F (36.9 C)   Resp 18   Ht 5\' 4"  (1.626 m)   Wt 73.9 kg   LMP  (LMP Unknown)   SpO2 100%   BMI 27.98 kg/m   Physical Exam Vitals and nursing note reviewed.  Constitutional:      Appearance: Normal appearance.  HENT:     Head: Normocephalic and atraumatic.  Eyes:     General:        Right eye: No discharge.        Left eye: No discharge.  Conjunctiva/sclera: Conjunctivae normal.  Pulmonary:     Effort: Pulmonary effort is normal.  Musculoskeletal:     Comments: Left knee shows moderate amount of swelling with associated tenderness.  Painful range of motion.  2+ posterior tibialis pulse on the left.  Skin:    General: Skin is warm and dry.     Findings: No rash.     Comments: Small hematoma to the volar aspect of the right thumb.  Neurological:     General: No focal deficit present.     Mental Status: She is alert.  Psychiatric:        Mood and Affect: Mood normal.        Behavior: Behavior normal.    ED Results / Procedures / Treatments   Labs (all labs ordered are listed, but only abnormal results are displayed) Labs Reviewed - No data to display  EKG None  Radiology CT Knee Left Wo Contrast  Result Date: 07/06/2021 CLINICAL DATA:  LEFT knee pain post fall EXAM: CT OF THE LEFT KNEE WITHOUT CONTRAST TECHNIQUE: Multidetector CT imaging of the LEFT knee was performed according to the standard protocol. Multiplanar CT image reconstructions were also generated. COMPARISON:  LEFT knee radiographs 07/06/2021 FINDINGS: Bones/Joint/Cartilage Diffuse osseous demineralization. Chondrocalcinosis question  CPPD. Joint effusion with fat/fluid/hemorrhage levels. Nondisplaced medial tibial plateau fracture with a fracture plane at the junction of the medial tibial plateau and central tibial eminence. No additional fracture or dislocation. Medial compartment joint space narrowing. Tricompartmental marginal spurs. Ligaments Suboptimally assessed by CT. PCL appears intact. ACL less well visualized due to central edema. Collateral ligaments grossly unremarkable. Muscles and tendons Muscular planes unremarkable. Patellar tendon intact. Quadriceps tendon intact. Soft tissues Unremarkable IMPRESSION: Nondisplaced fracture of the LEFT medial tibial plateau with associated lipohemarthrosis. Osseous demineralization with tricompartmental osteoarthritic changes and question CPPD. Electronically Signed   By: Lavonia Dana M.D.   On: 07/06/2021 18:15   DG Knee Complete 4 Views Left  Result Date: 07/06/2021 CLINICAL DATA:  Fall, pain EXAM: LEFT KNEE - COMPLETE 4+ VIEW COMPARISON:  None. FINDINGS: There is a vertical lucency along the medial tibial eminence/medial tibial plateau. There is tricompartment degenerative change with joint space narrowing. There is medial and lateral compartment chondrocalcinosis. There is a moderate-sized joint effusion. IMPRESSION: Moderate-sized joint effusion with possible nondisplaced fracture involving the tibial eminence/medial tibial plateau. Recommend CT. Electronically Signed   By: Maurine Simmering M.D.   On: 07/06/2021 15:08   DG Hand Complete Right  Result Date: 07/06/2021 CLINICAL DATA:  Fall, pain EXAM: RIGHT HAND - COMPLETE 3+ VIEW COMPARISON:  None. FINDINGS: There is no evidence of acute fracture or dislocation. There is mild-to-moderate diffuse interphalangeal joint degenerative change. IMPRESSION: No acute osseous abnormality. Electronically Signed   By: Maurine Simmering M.D.   On: 07/06/2021 15:03    Procedures Procedures   Medications Ordered in ED Medications - No data to  display  ED Course  I have reviewed the triage vital signs and the nursing notes.  Pertinent labs & imaging results that were available during my care of the patient were reviewed by me and considered in my medical decision making (see chart for details).  Clinical Course as of 07/06/21 1908  Tue Jul 06, 2021  1901 I discussed this case with my attending physician who cosigned this note including patient's presenting symptoms, physical exam, and planned diagnostics and interventions. Attending physician stated agreement with plan or made changes to plan which were implemented.   Attending physician assessed  patient at bedside.   [CF]    Clinical Course User Index [CF] Cherrie Gauze   MDM Rules/Calculators/A&P                          Tracey Mcclure is a 80 y.o. female who presents the emergency department with left knee pain after mechanical fall.  Imaging of the left knee reveals moderate swelling and possible tibial plateau fracture.  Will further evaluate with a CT of the knee. Pain is currently controlled.  CT of the knee revealed a left medial tibial plateau fracture.  Patient has a knee immobilizer in the car.  We will fit it here in the emergency department.  We will have her follow-up with orthopedics in the next 2 to 5 days for further evaluation.  She is neurovascularly intact distal to the knee.  All questions and concerns addressed.  Strict return precautions given.  Patient and family expressed full understanding.  She is safe for discharge.   Final Clinical Impression(s) / ED Diagnoses Final diagnoses:  Closed fracture of left tibial plateau, initial encounter    Rx / DC Orders ED Discharge Orders     None        Cherrie Gauze 07/06/21 Hoy Morn, MD 07/08/21 1919

## 2021-07-06 NOTE — ED Triage Notes (Signed)
Pt arrives pov, to triage in wheelchair, endorses mechanical fall pta. Endorses thinners, c/o R hand pain and left knee. Pt denies hitting head, denies neck pain

## 2021-07-06 NOTE — Discharge Instructions (Addendum)
You fractured your knee.  Please use your knee immobilizer for comfort.  I would like for you to follow-up with orthopedics in the next 2 to 5 days.  Tylenol/ibuprofen for pain.  Please return to the emergency department if you experience worsening pain, trouble walking, weakness or numbness to the lower extremity, or any other concerns might have.

## 2021-07-12 DIAGNOSIS — M79644 Pain in right finger(s): Secondary | ICD-10-CM | POA: Diagnosis not present

## 2021-07-12 DIAGNOSIS — M1712 Unilateral primary osteoarthritis, left knee: Secondary | ICD-10-CM | POA: Diagnosis not present

## 2021-07-20 ENCOUNTER — Other Ambulatory Visit: Payer: Self-pay | Admitting: Cardiology

## 2021-07-20 NOTE — Telephone Encounter (Signed)
Prescription refill request for Eliquis received.  Indication: afib  Last office visit: [place], 05/25/2021 Scr: 0.86, 10/22/2020 Age: 80 yo  Weight: 73.9 kg   Refill sent.

## 2021-08-11 DIAGNOSIS — M25562 Pain in left knee: Secondary | ICD-10-CM | POA: Diagnosis not present

## 2021-08-11 DIAGNOSIS — M79644 Pain in right finger(s): Secondary | ICD-10-CM | POA: Diagnosis not present

## 2021-08-18 ENCOUNTER — Other Ambulatory Visit: Payer: Self-pay | Admitting: Cardiology

## 2021-08-25 DIAGNOSIS — N390 Urinary tract infection, site not specified: Secondary | ICD-10-CM | POA: Diagnosis not present

## 2021-08-30 ENCOUNTER — Ambulatory Visit (INDEPENDENT_AMBULATORY_CARE_PROVIDER_SITE_OTHER): Payer: Medicare Other

## 2021-08-30 DIAGNOSIS — I441 Atrioventricular block, second degree: Secondary | ICD-10-CM

## 2021-08-30 LAB — CUP PACEART REMOTE DEVICE CHECK
Battery Remaining Longevity: 55 mo
Battery Voltage: 2.98 V
Brady Statistic RV Percent Paced: 97.75 %
Date Time Interrogation Session: 20230123130448
Implantable Lead Implant Date: 20170320
Implantable Lead Location: 753860
Implantable Lead Model: 5076
Implantable Pulse Generator Implant Date: 20170320
Lead Channel Impedance Value: 399 Ohm
Lead Channel Impedance Value: 437 Ohm
Lead Channel Pacing Threshold Amplitude: 0.625 V
Lead Channel Pacing Threshold Pulse Width: 0.4 ms
Lead Channel Sensing Intrinsic Amplitude: 6.375 mV
Lead Channel Sensing Intrinsic Amplitude: 6.375 mV
Lead Channel Setting Pacing Amplitude: 2 V
Lead Channel Setting Pacing Pulse Width: 0.4 ms
Lead Channel Setting Sensing Sensitivity: 2 mV

## 2021-09-08 DIAGNOSIS — M79644 Pain in right finger(s): Secondary | ICD-10-CM | POA: Diagnosis not present

## 2021-09-09 NOTE — Progress Notes (Signed)
Remote pacemaker transmission.   

## 2021-09-15 DIAGNOSIS — M25662 Stiffness of left knee, not elsewhere classified: Secondary | ICD-10-CM | POA: Diagnosis not present

## 2021-09-15 DIAGNOSIS — M6281 Muscle weakness (generalized): Secondary | ICD-10-CM | POA: Diagnosis not present

## 2021-09-15 DIAGNOSIS — M25562 Pain in left knee: Secondary | ICD-10-CM | POA: Diagnosis not present

## 2021-09-21 DIAGNOSIS — H349 Unspecified retinal vascular occlusion: Secondary | ICD-10-CM | POA: Diagnosis not present

## 2021-10-05 DIAGNOSIS — I10 Essential (primary) hypertension: Secondary | ICD-10-CM | POA: Diagnosis not present

## 2021-10-05 DIAGNOSIS — E785 Hyperlipidemia, unspecified: Secondary | ICD-10-CM | POA: Diagnosis not present

## 2021-10-05 DIAGNOSIS — I129 Hypertensive chronic kidney disease with stage 1 through stage 4 chronic kidney disease, or unspecified chronic kidney disease: Secondary | ICD-10-CM | POA: Diagnosis not present

## 2021-10-05 DIAGNOSIS — E039 Hypothyroidism, unspecified: Secondary | ICD-10-CM | POA: Diagnosis not present

## 2021-10-11 DIAGNOSIS — M79644 Pain in right finger(s): Secondary | ICD-10-CM | POA: Diagnosis not present

## 2021-10-27 DIAGNOSIS — N1831 Chronic kidney disease, stage 3a: Secondary | ICD-10-CM | POA: Diagnosis not present

## 2021-10-27 DIAGNOSIS — I421 Obstructive hypertrophic cardiomyopathy: Secondary | ICD-10-CM | POA: Diagnosis not present

## 2021-10-27 DIAGNOSIS — I129 Hypertensive chronic kidney disease with stage 1 through stage 4 chronic kidney disease, or unspecified chronic kidney disease: Secondary | ICD-10-CM | POA: Diagnosis not present

## 2021-10-27 DIAGNOSIS — I4891 Unspecified atrial fibrillation: Secondary | ICD-10-CM | POA: Diagnosis not present

## 2021-10-27 DIAGNOSIS — E039 Hypothyroidism, unspecified: Secondary | ICD-10-CM | POA: Diagnosis not present

## 2021-10-27 DIAGNOSIS — Z950 Presence of cardiac pacemaker: Secondary | ICD-10-CM | POA: Diagnosis not present

## 2021-10-27 DIAGNOSIS — D6869 Other thrombophilia: Secondary | ICD-10-CM | POA: Diagnosis not present

## 2021-10-27 DIAGNOSIS — I77810 Thoracic aortic ectasia: Secondary | ICD-10-CM | POA: Diagnosis not present

## 2021-10-27 DIAGNOSIS — G3184 Mild cognitive impairment, so stated: Secondary | ICD-10-CM | POA: Diagnosis not present

## 2021-10-27 DIAGNOSIS — R7301 Impaired fasting glucose: Secondary | ICD-10-CM | POA: Diagnosis not present

## 2021-10-27 DIAGNOSIS — I340 Nonrheumatic mitral (valve) insufficiency: Secondary | ICD-10-CM | POA: Diagnosis not present

## 2021-10-27 DIAGNOSIS — Z23 Encounter for immunization: Secondary | ICD-10-CM | POA: Diagnosis not present

## 2021-10-27 DIAGNOSIS — R413 Other amnesia: Secondary | ICD-10-CM | POA: Diagnosis not present

## 2021-11-05 DIAGNOSIS — E785 Hyperlipidemia, unspecified: Secondary | ICD-10-CM | POA: Diagnosis not present

## 2021-11-05 DIAGNOSIS — I129 Hypertensive chronic kidney disease with stage 1 through stage 4 chronic kidney disease, or unspecified chronic kidney disease: Secondary | ICD-10-CM | POA: Diagnosis not present

## 2021-11-05 DIAGNOSIS — E039 Hypothyroidism, unspecified: Secondary | ICD-10-CM | POA: Diagnosis not present

## 2021-11-05 DIAGNOSIS — I10 Essential (primary) hypertension: Secondary | ICD-10-CM | POA: Diagnosis not present

## 2021-11-09 DIAGNOSIS — U099 Post covid-19 condition, unspecified: Secondary | ICD-10-CM | POA: Diagnosis not present

## 2021-11-09 DIAGNOSIS — I4891 Unspecified atrial fibrillation: Secondary | ICD-10-CM | POA: Diagnosis not present

## 2021-11-09 DIAGNOSIS — R5383 Other fatigue: Secondary | ICD-10-CM | POA: Diagnosis not present

## 2021-11-09 DIAGNOSIS — E871 Hypo-osmolality and hyponatremia: Secondary | ICD-10-CM | POA: Diagnosis not present

## 2021-11-09 DIAGNOSIS — U071 COVID-19: Secondary | ICD-10-CM | POA: Diagnosis not present

## 2021-11-09 DIAGNOSIS — R0981 Nasal congestion: Secondary | ICD-10-CM | POA: Diagnosis not present

## 2021-11-09 DIAGNOSIS — N1831 Chronic kidney disease, stage 3a: Secondary | ICD-10-CM | POA: Diagnosis not present

## 2021-11-16 DIAGNOSIS — E871 Hypo-osmolality and hyponatremia: Secondary | ICD-10-CM | POA: Diagnosis not present

## 2021-11-16 DIAGNOSIS — R0981 Nasal congestion: Secondary | ICD-10-CM | POA: Diagnosis not present

## 2021-11-16 DIAGNOSIS — Z1152 Encounter for screening for COVID-19: Secondary | ICD-10-CM | POA: Diagnosis not present

## 2021-11-16 DIAGNOSIS — R5383 Other fatigue: Secondary | ICD-10-CM | POA: Diagnosis not present

## 2021-11-16 DIAGNOSIS — J309 Allergic rhinitis, unspecified: Secondary | ICD-10-CM | POA: Diagnosis not present

## 2021-11-29 ENCOUNTER — Ambulatory Visit (INDEPENDENT_AMBULATORY_CARE_PROVIDER_SITE_OTHER): Payer: Medicare Other

## 2021-11-29 DIAGNOSIS — I441 Atrioventricular block, second degree: Secondary | ICD-10-CM

## 2021-11-30 LAB — CUP PACEART REMOTE DEVICE CHECK
Battery Remaining Longevity: 49 mo
Battery Voltage: 2.98 V
Brady Statistic RV Percent Paced: 99.54 %
Date Time Interrogation Session: 20230425083822
Implantable Lead Implant Date: 20170320
Implantable Lead Location: 753860
Implantable Lead Model: 5076
Implantable Pulse Generator Implant Date: 20170320
Lead Channel Impedance Value: 380 Ohm
Lead Channel Impedance Value: 418 Ohm
Lead Channel Pacing Threshold Amplitude: 0.75 V
Lead Channel Pacing Threshold Pulse Width: 0.4 ms
Lead Channel Sensing Intrinsic Amplitude: 16.25 mV
Lead Channel Sensing Intrinsic Amplitude: 16.25 mV
Lead Channel Setting Pacing Amplitude: 2 V
Lead Channel Setting Pacing Pulse Width: 0.4 ms
Lead Channel Setting Sensing Sensitivity: 2 mV

## 2021-12-05 DIAGNOSIS — E785 Hyperlipidemia, unspecified: Secondary | ICD-10-CM | POA: Diagnosis not present

## 2021-12-05 DIAGNOSIS — E039 Hypothyroidism, unspecified: Secondary | ICD-10-CM | POA: Diagnosis not present

## 2021-12-05 DIAGNOSIS — I129 Hypertensive chronic kidney disease with stage 1 through stage 4 chronic kidney disease, or unspecified chronic kidney disease: Secondary | ICD-10-CM | POA: Diagnosis not present

## 2021-12-05 DIAGNOSIS — I10 Essential (primary) hypertension: Secondary | ICD-10-CM | POA: Diagnosis not present

## 2021-12-07 DIAGNOSIS — H10413 Chronic giant papillary conjunctivitis, bilateral: Secondary | ICD-10-CM | POA: Diagnosis not present

## 2021-12-14 NOTE — Progress Notes (Signed)
Remote pacemaker transmission.   

## 2021-12-17 DIAGNOSIS — I10 Essential (primary) hypertension: Secondary | ICD-10-CM | POA: Diagnosis not present

## 2021-12-17 DIAGNOSIS — F4320 Adjustment disorder, unspecified: Secondary | ICD-10-CM | POA: Diagnosis not present

## 2021-12-17 DIAGNOSIS — I340 Nonrheumatic mitral (valve) insufficiency: Secondary | ICD-10-CM | POA: Diagnosis not present

## 2021-12-17 DIAGNOSIS — I421 Obstructive hypertrophic cardiomyopathy: Secondary | ICD-10-CM | POA: Diagnosis not present

## 2021-12-17 DIAGNOSIS — I4891 Unspecified atrial fibrillation: Secondary | ICD-10-CM | POA: Diagnosis not present

## 2021-12-17 DIAGNOSIS — E039 Hypothyroidism, unspecified: Secondary | ICD-10-CM | POA: Diagnosis not present

## 2021-12-17 DIAGNOSIS — K219 Gastro-esophageal reflux disease without esophagitis: Secondary | ICD-10-CM | POA: Diagnosis not present

## 2021-12-17 DIAGNOSIS — U071 COVID-19: Secondary | ICD-10-CM | POA: Diagnosis not present

## 2021-12-17 DIAGNOSIS — N1831 Chronic kidney disease, stage 3a: Secondary | ICD-10-CM | POA: Diagnosis not present

## 2021-12-17 DIAGNOSIS — Z950 Presence of cardiac pacemaker: Secondary | ICD-10-CM | POA: Diagnosis not present

## 2021-12-17 DIAGNOSIS — I129 Hypertensive chronic kidney disease with stage 1 through stage 4 chronic kidney disease, or unspecified chronic kidney disease: Secondary | ICD-10-CM | POA: Diagnosis not present

## 2022-01-05 DIAGNOSIS — I129 Hypertensive chronic kidney disease with stage 1 through stage 4 chronic kidney disease, or unspecified chronic kidney disease: Secondary | ICD-10-CM | POA: Diagnosis not present

## 2022-01-05 DIAGNOSIS — E039 Hypothyroidism, unspecified: Secondary | ICD-10-CM | POA: Diagnosis not present

## 2022-01-05 DIAGNOSIS — N1831 Chronic kidney disease, stage 3a: Secondary | ICD-10-CM | POA: Diagnosis not present

## 2022-01-05 DIAGNOSIS — E785 Hyperlipidemia, unspecified: Secondary | ICD-10-CM | POA: Diagnosis not present

## 2022-01-19 DIAGNOSIS — H349 Unspecified retinal vascular occlusion: Secondary | ICD-10-CM | POA: Diagnosis not present

## 2022-02-04 DIAGNOSIS — I129 Hypertensive chronic kidney disease with stage 1 through stage 4 chronic kidney disease, or unspecified chronic kidney disease: Secondary | ICD-10-CM | POA: Diagnosis not present

## 2022-02-04 DIAGNOSIS — I10 Essential (primary) hypertension: Secondary | ICD-10-CM | POA: Diagnosis not present

## 2022-02-04 DIAGNOSIS — E039 Hypothyroidism, unspecified: Secondary | ICD-10-CM | POA: Diagnosis not present

## 2022-02-04 DIAGNOSIS — E785 Hyperlipidemia, unspecified: Secondary | ICD-10-CM | POA: Diagnosis not present

## 2022-02-17 DIAGNOSIS — D1801 Hemangioma of skin and subcutaneous tissue: Secondary | ICD-10-CM | POA: Diagnosis not present

## 2022-02-17 DIAGNOSIS — Z85828 Personal history of other malignant neoplasm of skin: Secondary | ICD-10-CM | POA: Diagnosis not present

## 2022-02-17 DIAGNOSIS — L821 Other seborrheic keratosis: Secondary | ICD-10-CM | POA: Diagnosis not present

## 2022-02-17 DIAGNOSIS — L814 Other melanin hyperpigmentation: Secondary | ICD-10-CM | POA: Diagnosis not present

## 2022-02-28 ENCOUNTER — Ambulatory Visit (INDEPENDENT_AMBULATORY_CARE_PROVIDER_SITE_OTHER): Payer: Medicare Other

## 2022-02-28 DIAGNOSIS — I441 Atrioventricular block, second degree: Secondary | ICD-10-CM | POA: Diagnosis not present

## 2022-03-01 LAB — CUP PACEART REMOTE DEVICE CHECK
Battery Remaining Longevity: 44 mo
Battery Voltage: 2.97 V
Brady Statistic RV Percent Paced: 99.6 %
Date Time Interrogation Session: 20230724200214
Implantable Lead Implant Date: 20170320
Implantable Lead Location: 753860
Implantable Lead Model: 5076
Implantable Pulse Generator Implant Date: 20170320
Lead Channel Impedance Value: 456 Ohm
Lead Channel Impedance Value: 494 Ohm
Lead Channel Pacing Threshold Amplitude: 0.75 V
Lead Channel Pacing Threshold Pulse Width: 0.4 ms
Lead Channel Sensing Intrinsic Amplitude: 17 mV
Lead Channel Sensing Intrinsic Amplitude: 17 mV
Lead Channel Setting Pacing Amplitude: 2 V
Lead Channel Setting Pacing Pulse Width: 0.4 ms
Lead Channel Setting Sensing Sensitivity: 2 mV

## 2022-03-30 NOTE — Progress Notes (Signed)
Remote pacemaker transmission.   

## 2022-04-02 IMAGING — CT CT ABD-PELV W/ CM
2 of 5 series · 16 of 46 positions shown, 18 images · IV contrast (omnipaque)
Comparison: 10/11/2013

CLINICAL DATA: Right lower quadrant abdominal pain over the last 5
days with diarrhea, nausea, vomiting, and fever.

EXAM:
CT ABDOMEN AND PELVIS WITH CONTRAST
TECHNIQUE: Multidetector CT imaging of the abdomen and pelvis was performed
using the standard protocol following bolus administration of
intravenous contrast.
CONTRAST:  100mL OMNIPAQUE IOHEXOL 300 MG/ML  SOLN

[Series 2: axial st · axial · 0.84mm/px · z∈[+626,+1012]mm · 13 of 87 slices shown, 15 images]
[im 5/87  soft-tissue]
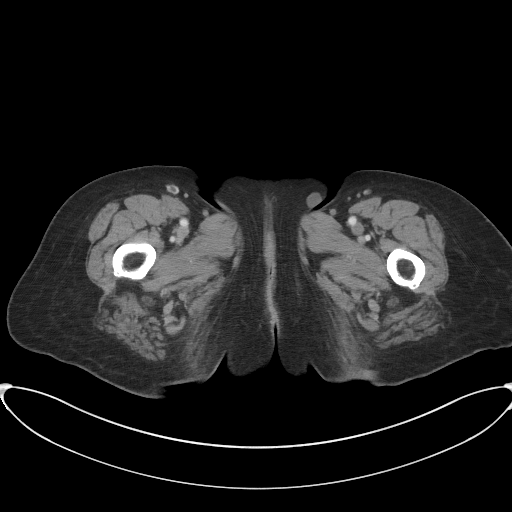
[im 5/87  bone]
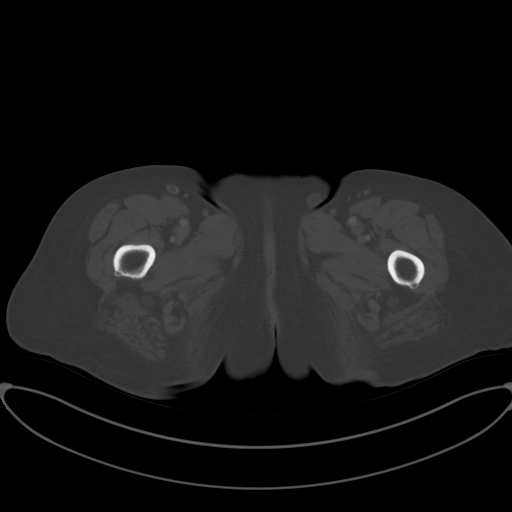
[im 14/87  soft-tissue]
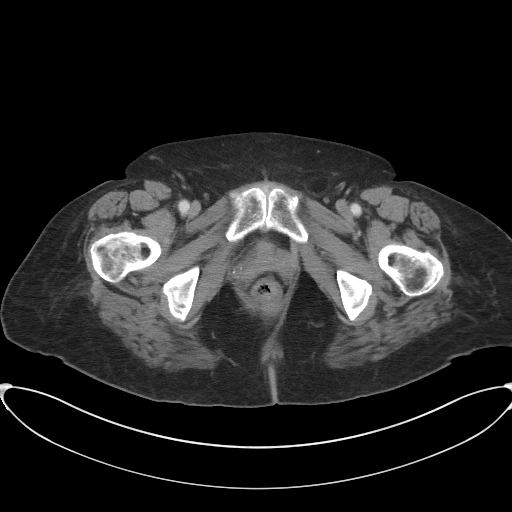
[im 19/87  soft-tissue]
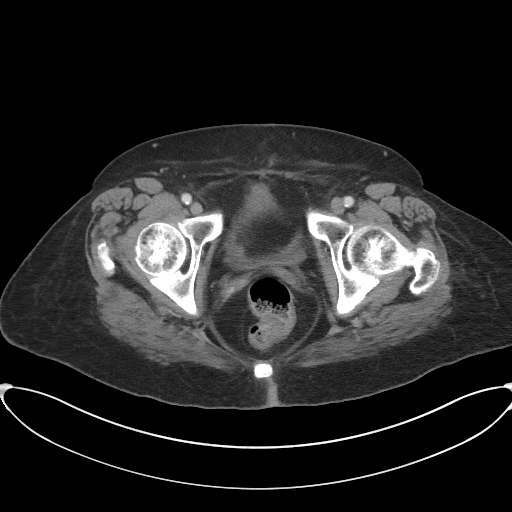
[im 23/87  soft-tissue]
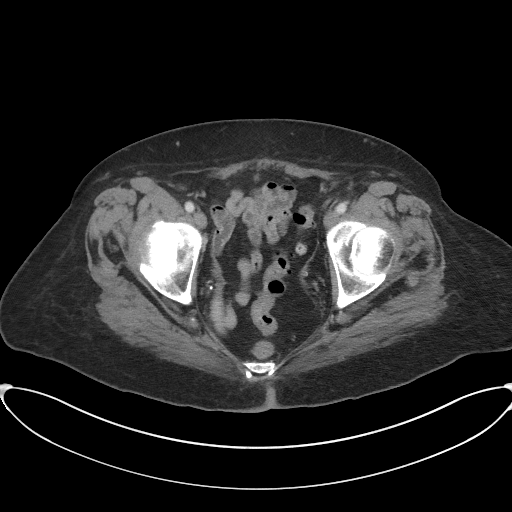
[im 32/87  soft-tissue]
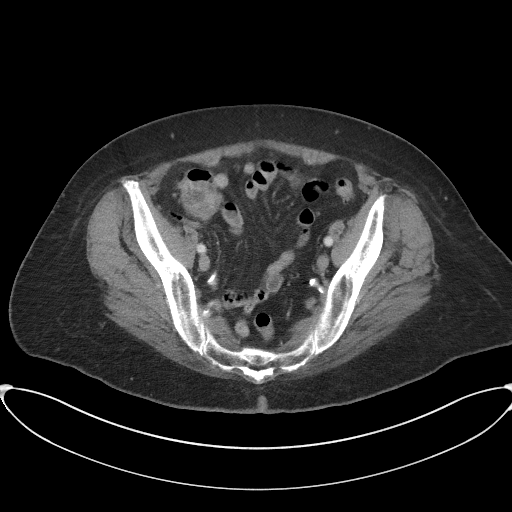
[im 37/87  soft-tissue]
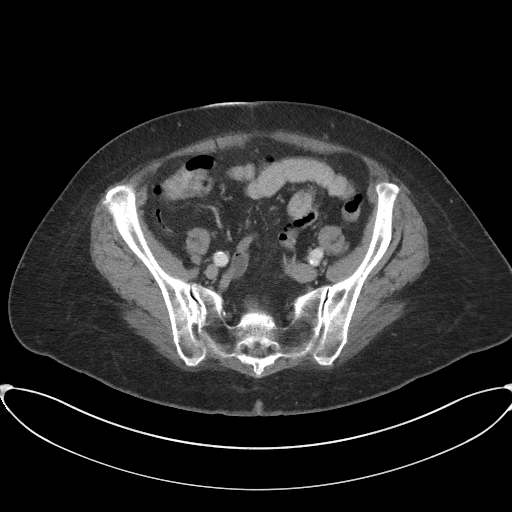
[im 46/87  soft-tissue]
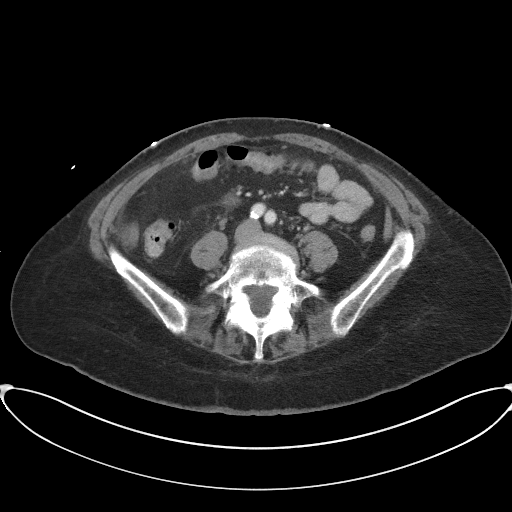
[im 50/87  soft-tissue]
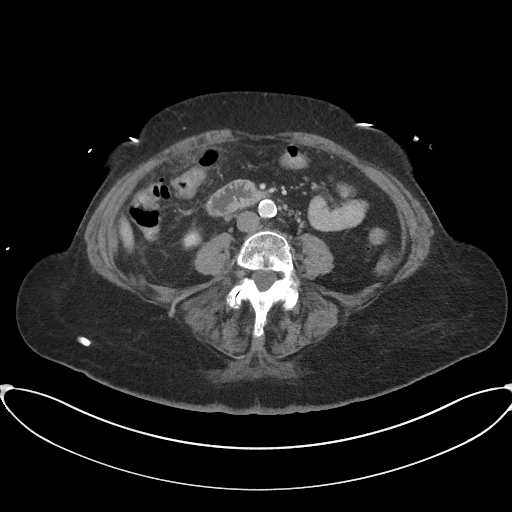
[im 55/87  soft-tissue]
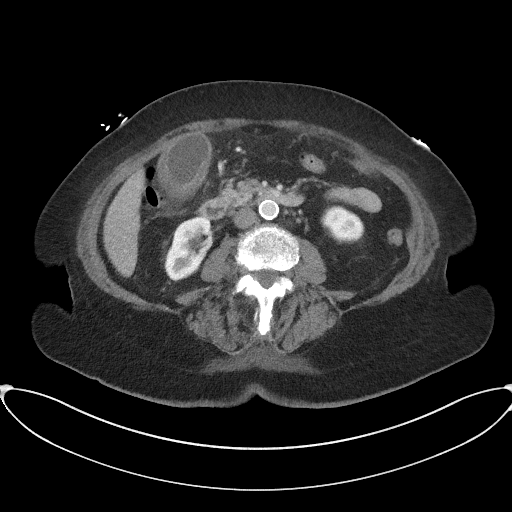
[im 55/87  bone]
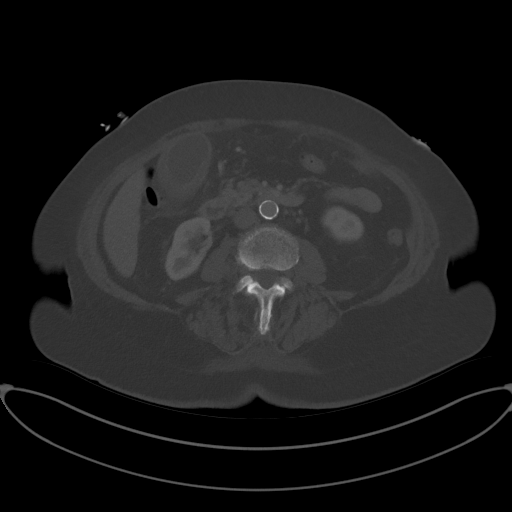
[im 64/87  soft-tissue]
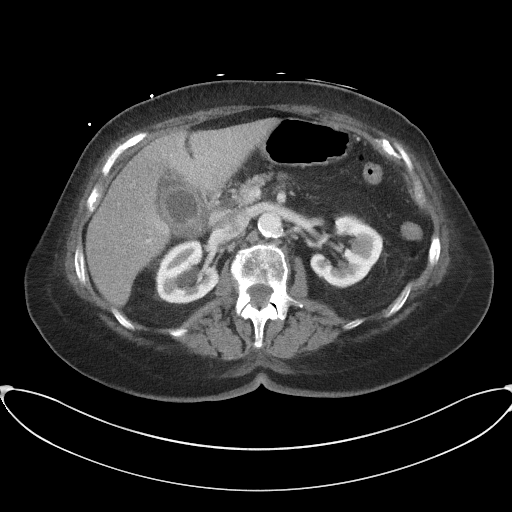
[im 68/87  soft-tissue]
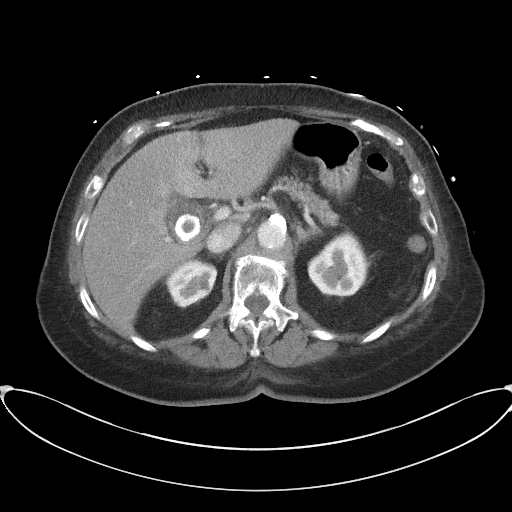
[im 73/87  soft-tissue]
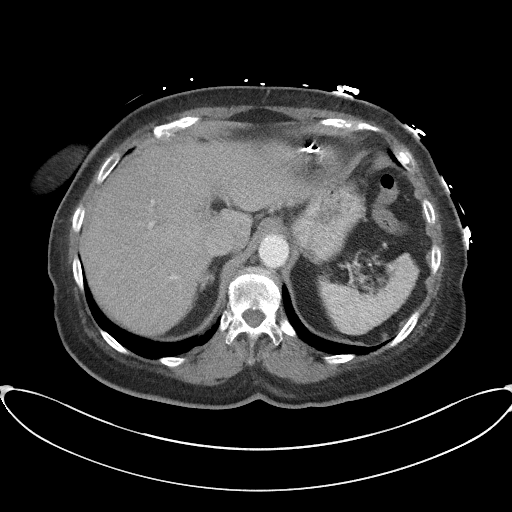
[im 82/87  soft-tissue]
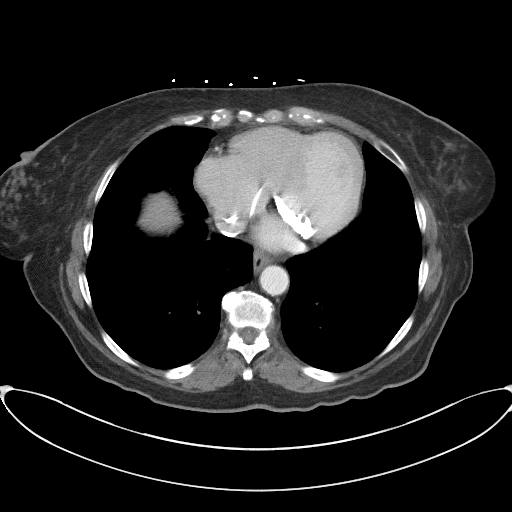

[Series 5: coronal st · coronal · 0.89mm/px · 3 of 96 slices shown]
[im 32/96  soft-tissue]
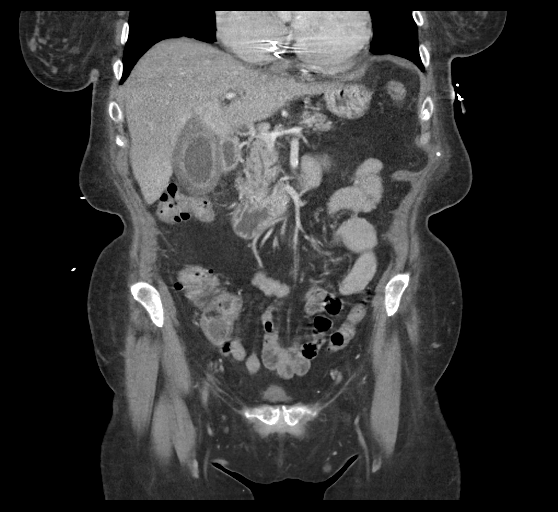
[im 43/96  soft-tissue]
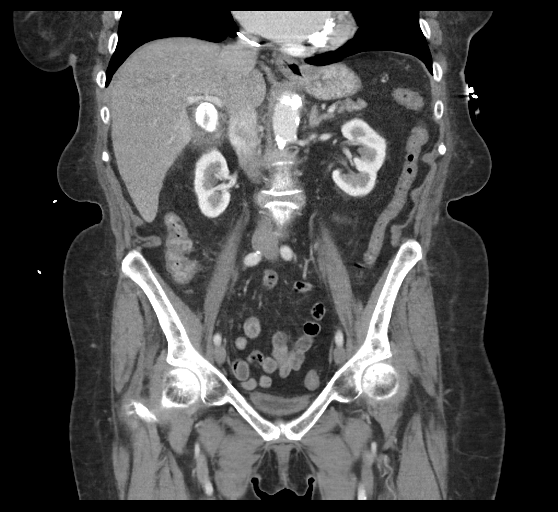
[im 53/96  soft-tissue]
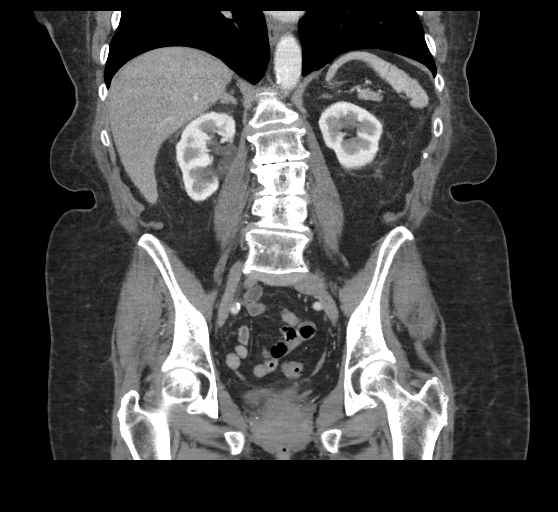

[16 of 46 positions shown; findings below may reference images not displayed]

FINDINGS: Lower chest: Dense mitral calcification. Pacer leads noted. Right
coronary artery atherosclerotic calcification. Descending thoracic
aortic atherosclerosis.

Hepatobiliary: Striking wall thickening in the gallbladder with
single wall mural thickness up to 1.8 cm. There is a 3.4 cm in long
axis gallstone proximally in the gallbladder. The mild stranding in
the pericholecystic tissues. No significant biliary dilatation. No
significant focal liver lesion identified.

Pancreas: Unremarkable

Spleen: Unremarkable

Adrenals/Urinary Tract: Adrenal glands normal. Mild right
hydronephrosis without hydroureter and without stone at the UPJ,
query chronic mild UPJ narrowing. There is no significant asymmetry
in contrast enhancement the kidneys.

0.7 cm hypodense lesion of the right kidney upper pole is likely a
small cyst but technically too small to characterize.

Stomach/Bowel: Sigmoid colon diverticulosis without active
diverticulitis. No dilated bowel. Normal appendix.

Vascular/Lymphatic: Aortoiliac atherosclerotic vascular disease.

Reproductive: Uterus absent.  Adnexa unremarkable.

Other: No supplemental non-categorized findings.

Musculoskeletal: Chronic bilateral pars defects at L5 with grade 1
anterolisthesis. There is also grade 1 degenerative anterolisthesis
at L4-5. Lumbar spondylosis and degenerative disc disease leading to
multilevel foraminal impingement most notably on the left at L5-S1.
IMPRESSION: 1. Striking wall thickening in the gallbladder with a 3.4 cm in long
axis gallstone proximally in the gallbladder. Mild stranding in the
pericholecystic tissues. Appearance favors acute cholecystitis,
correlate with clinical presentation. No CBD dilatation.
2. Other imaging findings of potential clinical significance:
Coronary atherosclerosis. Dense mitral calcification. Sigmoid colon
diverticulosis. Chronic bilateral pars defects at L5 with grade 1
anterolisthesis at L4-5 and L5-S1. Lumbar spondylosis and
degenerative disc disease leading to multilevel foraminal
impingement most notably on the left at L5-S1.
3. Aortic atherosclerosis.

Aortic Atherosclerosis ([id number]).

## 2022-04-12 NOTE — Progress Notes (Signed)
Tracey Mcclure Date of Birth: 03-23-1941   History of Present Illness: Tracey Mcclure is seen today for followup of atrial fibrillation and bradycardia. She has a history of hypertrophic obstructive cardiomyopathy, hypertension, and Mobitz type I second-degree AV block. When seen in April 2015 she was noted to be in atrial fibrillation with a slow ventricular response of 53. She had only mild palpitations. Verapamil was discontinued. 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 Prior extensive GI work up for heme positive stool and iron deficiency anemia was unrevealing.   She had a Holter monitor in august 2016 that showed sinus rhythm with PACs and PVCs. Some intermittent second degree AV block. Mean HR 65 with slowest HR 31 and longest pause of 3.1 seconds. She was seen by Rosaria Ferries PA. At that time Ecg showed AFib with rate 43 bpm. She complained of some intermittent edema and fatigue.  She was seen by Dr. Lovena Le and underwent PPM placement in March 2017.  In January she presented with a lead fracture and had lead revision on 09/01/17.  Last pacemaker check in July was good.   On a prior visit we stopped her metoprolol because her BP was low and she felt fatigued. She did note a significant improvement in her fatigue. BP readings from home looked very good thru September. Echo in October 2021 showed septal hypertrophy without outflow gradient. Moderate MR.   She was admitted at the end of January 2022 with acute cholecystitis. Had IR drain placed. HCTZ initially held due to hyponatremia but this improved and it was resumed at DC. She had drain removed in May.   In November 2022 she had a mechanical fall and fractured her left medial tibial plateau. She states it took a while to rehab from this.  On follow up today she notes lack of energy. Happy she is still able to stay in her home. Notes memory is declining. No dyspnea or chest pain.  Current Outpatient Medications on File Prior to  Visit  Medication Sig Dispense Refill   acetaminophen (TYLENOL) 325 MG tablet Take 650 mg by mouth every 6 (six) hours as needed for mild pain or headache.      atorvastatin (LIPITOR) 40 MG tablet Take 1 tablet (40 mg total) by mouth daily. 90 tablet 3   Cholecalciferol (VITAMIN D) 2000 units CAPS Take 2,000 Units by mouth daily.      cyanocobalamin 1000 MCG tablet Take 1,000 mcg by mouth daily.     ELIQUIS 5 MG TABS tablet TAKE 1 TABLET BY MOUTH TWICE A DAY 180 tablet 1   feeding supplement (ENSURE SURGERY) LIQD Take 237 mLs by mouth 2 (two) times daily between meals.     hydrochlorothiazide (HYDRODIURIL) 25 MG tablet Take 0.5 tablets (12.5 mg total) by mouth daily. 90 tablet 2   levothyroxine (SYNTHROID, LEVOTHROID) 125 MCG tablet Take 62.5-125 mcg by mouth See admin instructions. 193mg daily EXCEPT 62.590m on sunday     losartan (COZAAR) 25 MG tablet Take 25 mg by mouth at bedtime.     ondansetron (ZOFRAN) 4 MG tablet Take 1 tablet (4 mg total) by mouth every 6 (six) hours as needed for nausea. 20 tablet 0   Probiotic Product (PROBIOTIC DAILY PO) Take 1 tablet by mouth daily.     No current facility-administered medications on file prior to visit.    Allergies  Allergen Reactions   Dilaudid [Hydromorphone Hcl] Nausea And Vomiting    PT states she has uncontrolled nausea  and vomiting    Azithromycin Other (See Comments)    Patient states that she had severe stomach cramps for a solid 4 hours.    Pravastatin     Other reaction(s): joint aches    Past Medical History:  Diagnosis Date   Atrial fibrillation ([place]) 12/05/2013   Colon adenoma    Gastric mass 10/14/2013   GERD (gastroesophageal reflux disease)    HTN (hypertension)    CONTROLLED   Hypercholesteremia    Hypertr obst cardiomyop    Hypothyroidism    Iron deficiency anemia, unspecified 10/14/2013   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   Mitral insufficiency    Mobitz type 1 second degree  atrioventricular block 12/03/2012   Obesity    Palpitations     Past Surgical History:  Procedure Laterality Date   ABDOMINAL HYSTERECTOMY     BILIARY STENT PLACEMENT N/A 10/21/2020   Procedure: BILIARY STENT PLACEMENT;  Surgeon: Arta Silence, MD;  Location: [facility];  Service: Endoscopy;  Laterality: N/A;   CHOLECYSTECTOMY N/A 10/16/2020   Procedure: LAPAROSCOPIC CHOLECYSTECTOMY WITH INTRAOPERATIVE CHOLANGIOGRAM;  Surgeon: Johnathan Hausen, MD;  Location: [facility];  Service: General;  Laterality: N/A;   EP IMPLANTABLE DEVICE N/A 10/26/2015   Procedure: Pacemaker Implant;  Surgeon: Evans Lance, MD;  Location: [facility];  Service: Cardiovascular;  Laterality: N/A;   ERCP N/A 10/21/2020   Procedure: ENDOSCOPIC RETROGRADE CHOLANGIOPANCREATOGRAPHY (ERCP);  Surgeon: Arta Silence, MD;  Location: [facility];  Service: Endoscopy;  Laterality: N/A;   ERCP N/A 12/09/2020   Procedure: ENDOSCOPIC RETROGRADE CHOLANGIOPANCREATOGRAPHY (ERCP) with stent removal DO NOT USE ONE VIEW SCOPE;  Surgeon: Arta Silence, MD;  Location: [facility];  Service: Endoscopy;  Laterality: N/A;   IR CHOLANGIOGRAM EXISTING TUBE  10/12/2020   IR PERC CHOLECYSTOSTOMY  09/07/2020   OVARY SURGERY     BENIGN TUMOR   PACEMAKER REVISION N/A 09/01/2017   Procedure: PACEMAKER REVISION;  Surgeon: Constance Haw, MD;  Location: [facility];  Service: Cardiovascular;  Laterality: N/A;   PANCREATIC STENT PLACEMENT  10/21/2020   Procedure: PANCREATIC STENT PLACEMENT;  Surgeon: Arta Silence, MD;  Location: [facility];  Service: Endoscopy;;   SPHINCTEROTOMY  10/21/2020   Procedure: Joan Mayans;  Surgeon: Arta Silence, MD;  Location: [facility];  Service: Endoscopy;;   STENT REMOVAL  12/09/2020   Procedure: STENT REMOVAL;  Surgeon: Arta Silence, MD;  Location: [facility];  Service: Endoscopy;;  biliary    Social History   Tobacco Use  Smoking Status Never  Smokeless Tobacco Never     Social History   Substance and Sexual Activity  Alcohol Use No    Family History  Problem Relation Age of Onset   Heart attack Brother 25   Prostate cancer Brother    Breast cancer Sister 22    Review of Systems: As noted in history of present illness.All other systems were reviewed and are negative.  Physical Exam: BP 130/74   Pulse 83   Ht '5\' 4"'$  (1.626 m)   Wt 168 lb 3.2 oz (76.3 kg)   LMP  (LMP Unknown)   SpO2 99%   BMI 28.87 kg/m  GENERAL:  Well appearing WF in NAD HEENT:  PERRL, EOMI, sclera are clear. Oropharynx is clear. NECK:  No jugular venous distention, carotid upstroke brisk and symmetric, no bruits, no thyromegaly or adenopathy LUNGS:  Clear to auscultation bilaterally CHEST:  Unremarkable. Pacer site looks OK.  HEART:  RRR,  PMI not displaced or  sustained,S1 and S2 within normal limits, no S3, no S4: no clicks, no rubs, gr 2/6 systolic murmur across the precordium.  ABD:  Soft, nontender. BS +, no masses or bruits. No hepatomegaly, no splenomegaly EXT:  2 + pulses throughout, no edema SKIN:  Warm and dry.  No rashes NEURO:  Alert and oriented x 3. Cranial nerves II through XII intact. PSYCH:  Cognitively intact    LABORATORY DATA: Lab Results  Component Value Date   WBC 8.0 10/23/2020   HGB 10.6 (L) 10/23/2020   HCT 31.3 (L) 10/23/2020   PLT 356 10/23/2020   GLUCOSE 96 10/22/2020   ALT 29 10/22/2020   AST 22 10/22/2020   NA 132 (L) 10/22/2020   K 4.0 10/22/2020   CL 100 10/22/2020   CREATININE 0.86 10/22/2020   BUN 10 10/22/2020   CO2 23 10/22/2020   TSH 8.431 (H) 10/16/2020   INR 1.6 (H) 10/13/2020   Labs dated 11/30/15: cholesterol 165, triglycerides 97, LDL 76, HDL 70. BMET and TSH normal.  Dated 12/19/16: cholesterol 148, triglycerides 90, HDL 65, LDL 65. BMET and TSH normal. A1c 5.5%.  Dated 01/29/18: cholesterol 148, triglycerides 99, HDL 65, LDL 63. Dated 03/11/19: cholesterol 148, triglycerides 104, HDL 63, LDL 64, A1c 5.3%. CMET,  CBC, TSH normal. Dated 04/07/20: cholesterol 151,triglycerides 116, HDL 66, LDL 62. A1c 5.2%. CBC, TFTs, CMET normal. Dated 10/23/20: Hgb 10.6 Dated 04/19/21: cholesterol 152, triglycerides 86, HDL 76, LDL 59. A1c 5.2%. CMET and TSH normal. Dated 10/27/21: A1c 5.4%. Normal CMET  Ecg today shows V paced rhythm rate 83. I have personally reviewed and interpreted this study.   Echo: 05/18/20: IMPRESSIONS     1. Left ventricular ejection fraction, by estimation, is 65 to 70%. The  left ventricle has normal function. The left ventricle has no regional  wall motion abnormalities. There is severe asymmetric left ventricular  hypertrophy of the basal-septal  segment. No significant LVOT gradient at rest. Left ventricular diastolic  parameters are indeterminate.   2. Right ventricular systolic function is normal. The right ventricular  size is normal. There is mildly elevated pulmonary artery systolic  pressure. The estimated right ventricular systolic pressure is 46.5 mmHg.   3. Left atrial size was severely dilated.   4. The mitral valve is abnormal. Moderate mitral valve regurgitation. No  evidence of mitral stenosis. Severe mitral annular calcification.   5. Tricuspid valve regurgitation is moderate.   6. The aortic valve is tricuspid. Aortic valve regurgitation is trivial.  No aortic stenosis is present.   7. Aortic dilatation noted. There is dilatation of the ascending aorta,  measuring 43 mm.   8. The inferior vena cava is normal in size with greater than 50%  respiratory variabi  Assessment / Plan: 1. Atrial fibrillation- paroxysmal- with slow ventricular response.  History of sinus bradycardia with second-degree Mobitz type I AV block.  CHAD-Vasc score of 3-4. On Eliquis for anticoagulation. s/p PPM- revised in January 2019 for lead fracture. Functioning well now. HR is controlled on no AV nodal blocking agents. V paced today  2. Hypertrophic cardiomyopathy. Echocardiogram done   October 2021 without outflow gradeient. Normal EF. Off Toprol due to fatigue and low BP.   3. Mitral insufficiency-mod  related to her hypertrophic cardiomyopathy. Stable murmur.   4. Hypertension- BP is well controlled.   5. MGUS- followed yearly by hematology. No change.  Follow up in one year

## 2022-04-14 ENCOUNTER — Ambulatory Visit: Payer: Medicare Other | Attending: Cardiology | Admitting: Cardiology

## 2022-04-14 ENCOUNTER — Encounter: Payer: Self-pay | Admitting: Cardiology

## 2022-04-14 VITALS — BP 130/74 | HR 83 | Ht 64.0 in | Wt 168.2 lb

## 2022-04-14 DIAGNOSIS — I340 Nonrheumatic mitral (valve) insufficiency: Secondary | ICD-10-CM | POA: Insufficient documentation

## 2022-04-14 DIAGNOSIS — I441 Atrioventricular block, second degree: Secondary | ICD-10-CM | POA: Insufficient documentation

## 2022-04-14 DIAGNOSIS — I482 Chronic atrial fibrillation, unspecified: Secondary | ICD-10-CM | POA: Insufficient documentation

## 2022-04-14 DIAGNOSIS — Z950 Presence of cardiac pacemaker: Secondary | ICD-10-CM | POA: Diagnosis not present

## 2022-04-14 DIAGNOSIS — I421 Obstructive hypertrophic cardiomyopathy: Secondary | ICD-10-CM | POA: Insufficient documentation

## 2022-04-14 DIAGNOSIS — I10 Essential (primary) hypertension: Secondary | ICD-10-CM | POA: Insufficient documentation

## 2022-05-04 DIAGNOSIS — R7989 Other specified abnormal findings of blood chemistry: Secondary | ICD-10-CM | POA: Diagnosis not present

## 2022-05-04 DIAGNOSIS — E785 Hyperlipidemia, unspecified: Secondary | ICD-10-CM | POA: Diagnosis not present

## 2022-05-04 DIAGNOSIS — E039 Hypothyroidism, unspecified: Secondary | ICD-10-CM | POA: Diagnosis not present

## 2022-05-04 DIAGNOSIS — R5383 Other fatigue: Secondary | ICD-10-CM | POA: Diagnosis not present

## 2022-05-04 DIAGNOSIS — R7301 Impaired fasting glucose: Secondary | ICD-10-CM | POA: Diagnosis not present

## 2022-05-04 DIAGNOSIS — I10 Essential (primary) hypertension: Secondary | ICD-10-CM | POA: Diagnosis not present

## 2022-05-09 IMAGING — XA IR CHOLANGIOGRAM VIA EXIST CATHETER
3 series · 9 of 9 positions shown · non-contrast
Comparison: none

INDICATION: History of acute cholecystitis, status post percutaneous
cholecystostomy catheter placement.

[Series 1: fl - angio · 4 of 58 frames shown (1 of 2)]
[frame 9/58]
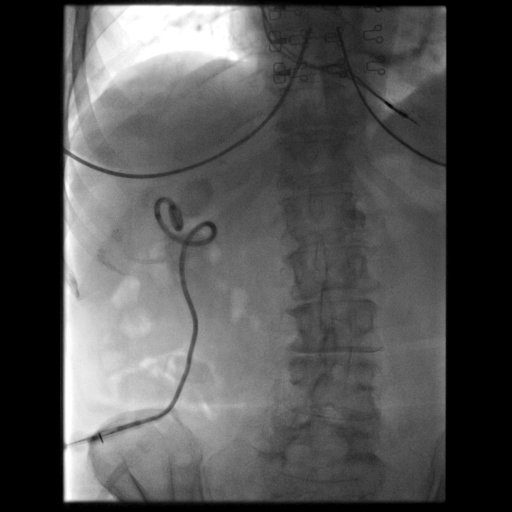
[frame 13/58]
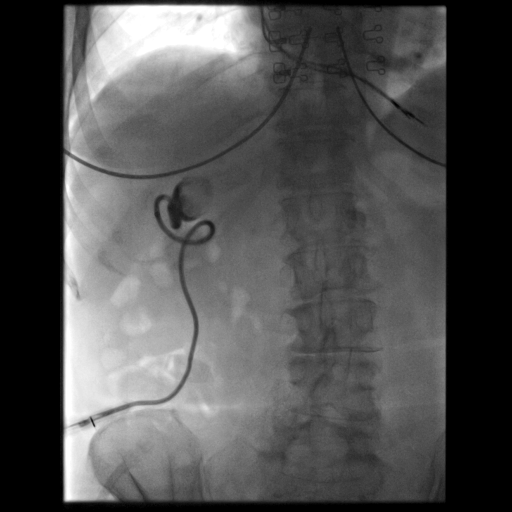
[frame 30/58]
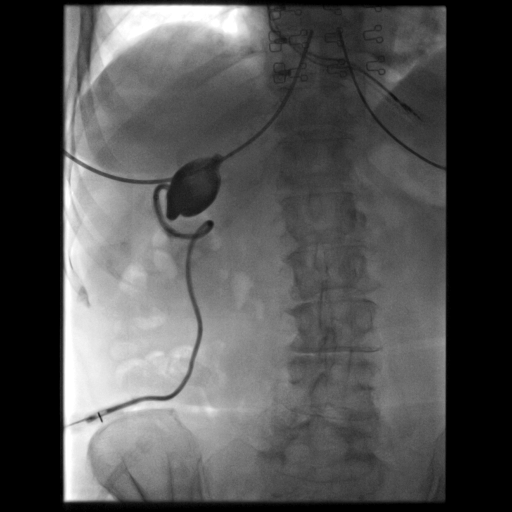
[frame 50/58]
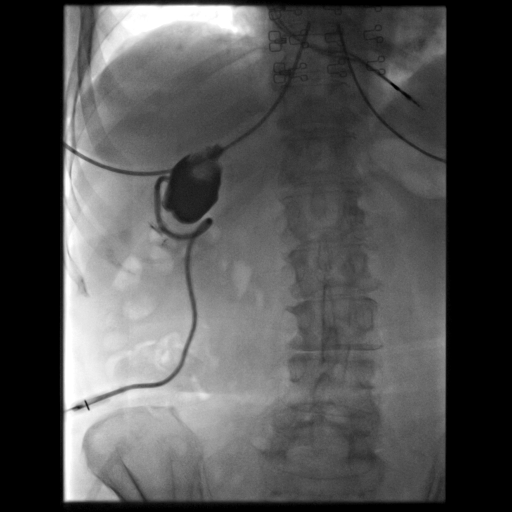

[Series 2: fl - angio · 4 of 22 frames shown (2 of 2)]
[frame 4/22]
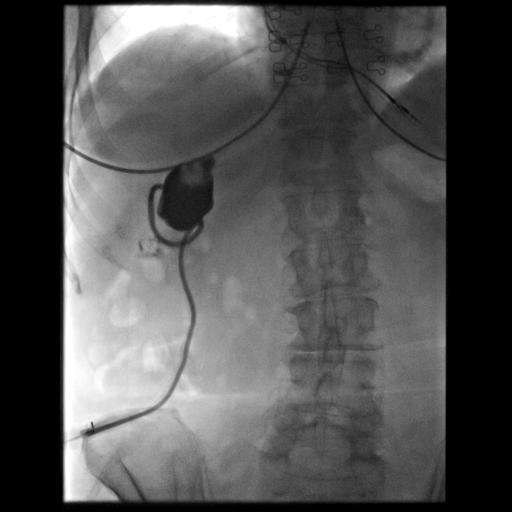
[frame 12/22]
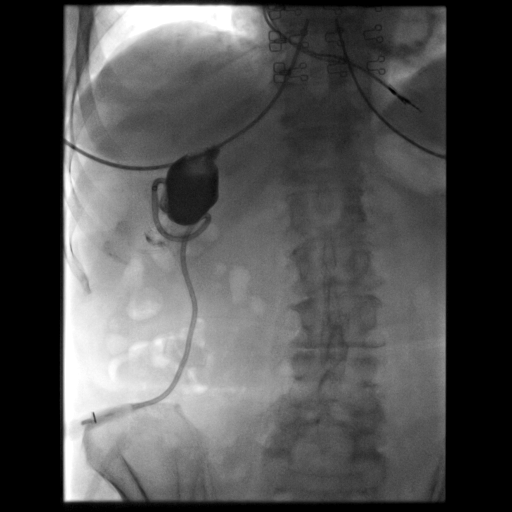
[frame 19/22]
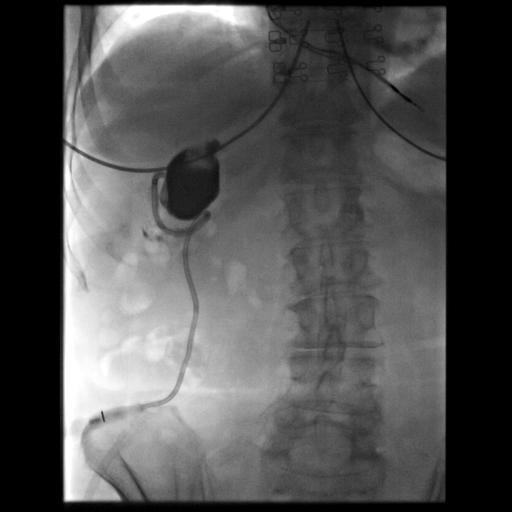
[frame 22/22]
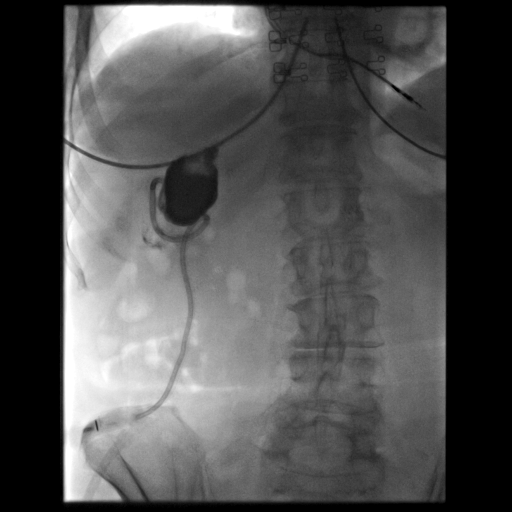

[Series 300: ir cholangiogram existing tube · 1 of 1 slices shown]
[im 1/1]
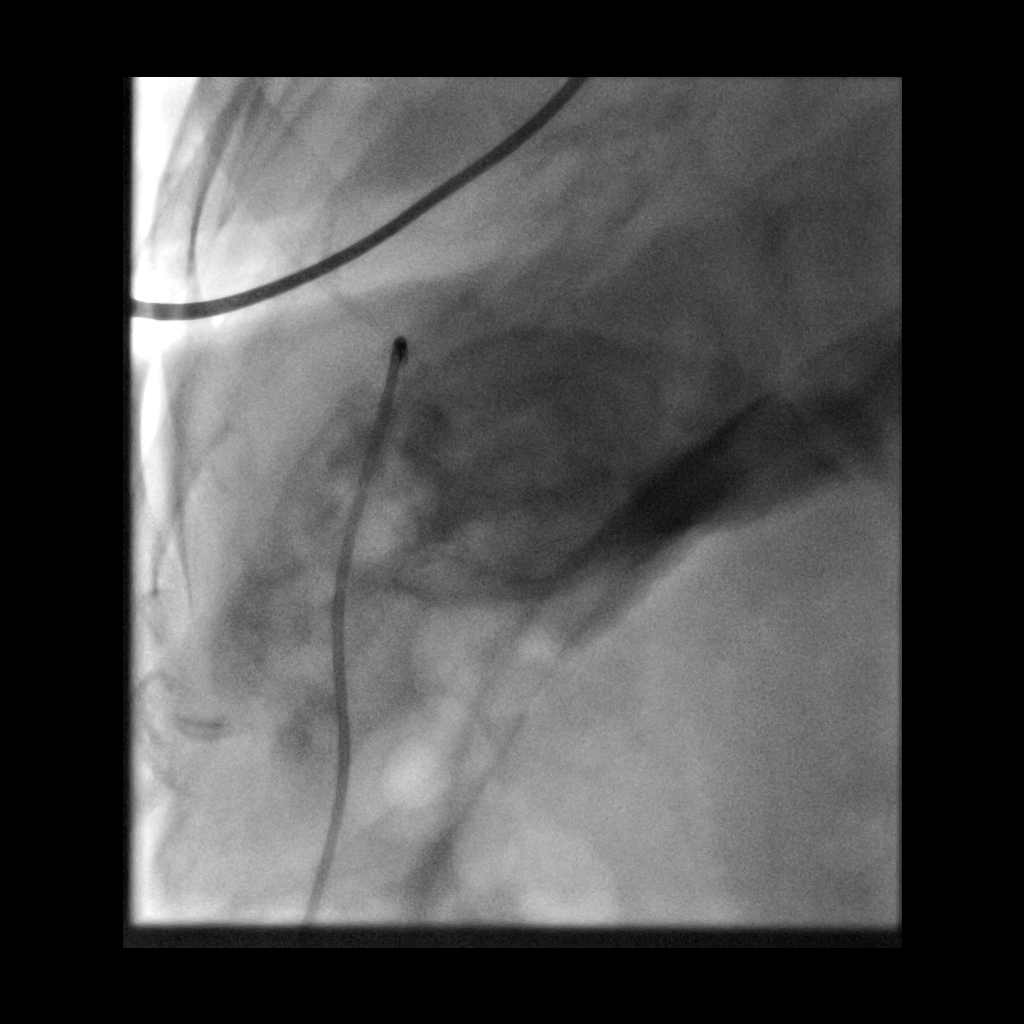

[9 of 9 positions shown; findings below may reference images not displayed]

EXAM:
CHOLANGIOGRAM THROUGH EXISTING CATHETER

EXCHANGE OF CHOLECYSTOSTOMY CATHETER (ATTEMPTED)

MEDICATIONS:
None indicated

ANESTHESIA/SEDATION:
1% lidocaine subcutaneous

FLUOROSCOPY TIME:  13 minutes 54 seconds; 300 mGy

COMPLICATIONS:
None immediate.

PROCEDURE:
Initially, cholangiogram through the previously placed
cholecystostomy catheter was performed.

Informed written consent was obtained from the patient after a
thorough discussion of the procedural risks, benefits and
alternatives. All questions were addressed. Maximal Sterile Barrier
Technique was utilized including caps, mask, sterile gowns, sterile
gloves, sterile drape, hand hygiene and skin antiseptic. A timeout
was performed prior to the initiation of the procedure.

An angled glidewire was advanced through the catheter. The retention
string was released and the catheter was withdrawn. Because of
unusual catheter course, a 5 French Kumpe catheter was advanced over
the wire in attempts to gain more central access to the gallbladder
lumen. Access could not be achieved so the Kumpe the catheter and
guidewire were removed. The patient tolerated the procedure well.
FINDINGS: Cholangiogram shows decompressed gallbladder containing a large
stone. There is no opacification of the cystic duct or common duct.
A small amount of extravasation around the cholecystostomy catheter
was noted at the end of the injection.

Attempts to exchange and achieve more durable drain catheter
positioning were unsuccessful and percutaneous access to gallbladder
was lost.
IMPRESSION: 1. Obstructing gallstone with non opacification cystic duct and
biliary tree.
2. The malpositioned cholecystostomy catheter could not be
repositioned within the gallbladder lumen and percutaneous access
was lost. The findings were communicated to the patient, daughter,
and general surgery whom the patient is scheduled to see in the
office in 2 days.

## 2022-05-10 IMAGING — DX DG CHEST 1V PORT
1 series · 1 of 1 positions shown · non-contrast
Comparison: Radiograph 09/02/2017, CT 06/03/2020

CLINICAL DATA: Questionable sepsis

EXAM:
PORTABLE CHEST 1 VIEW

[chest ap]
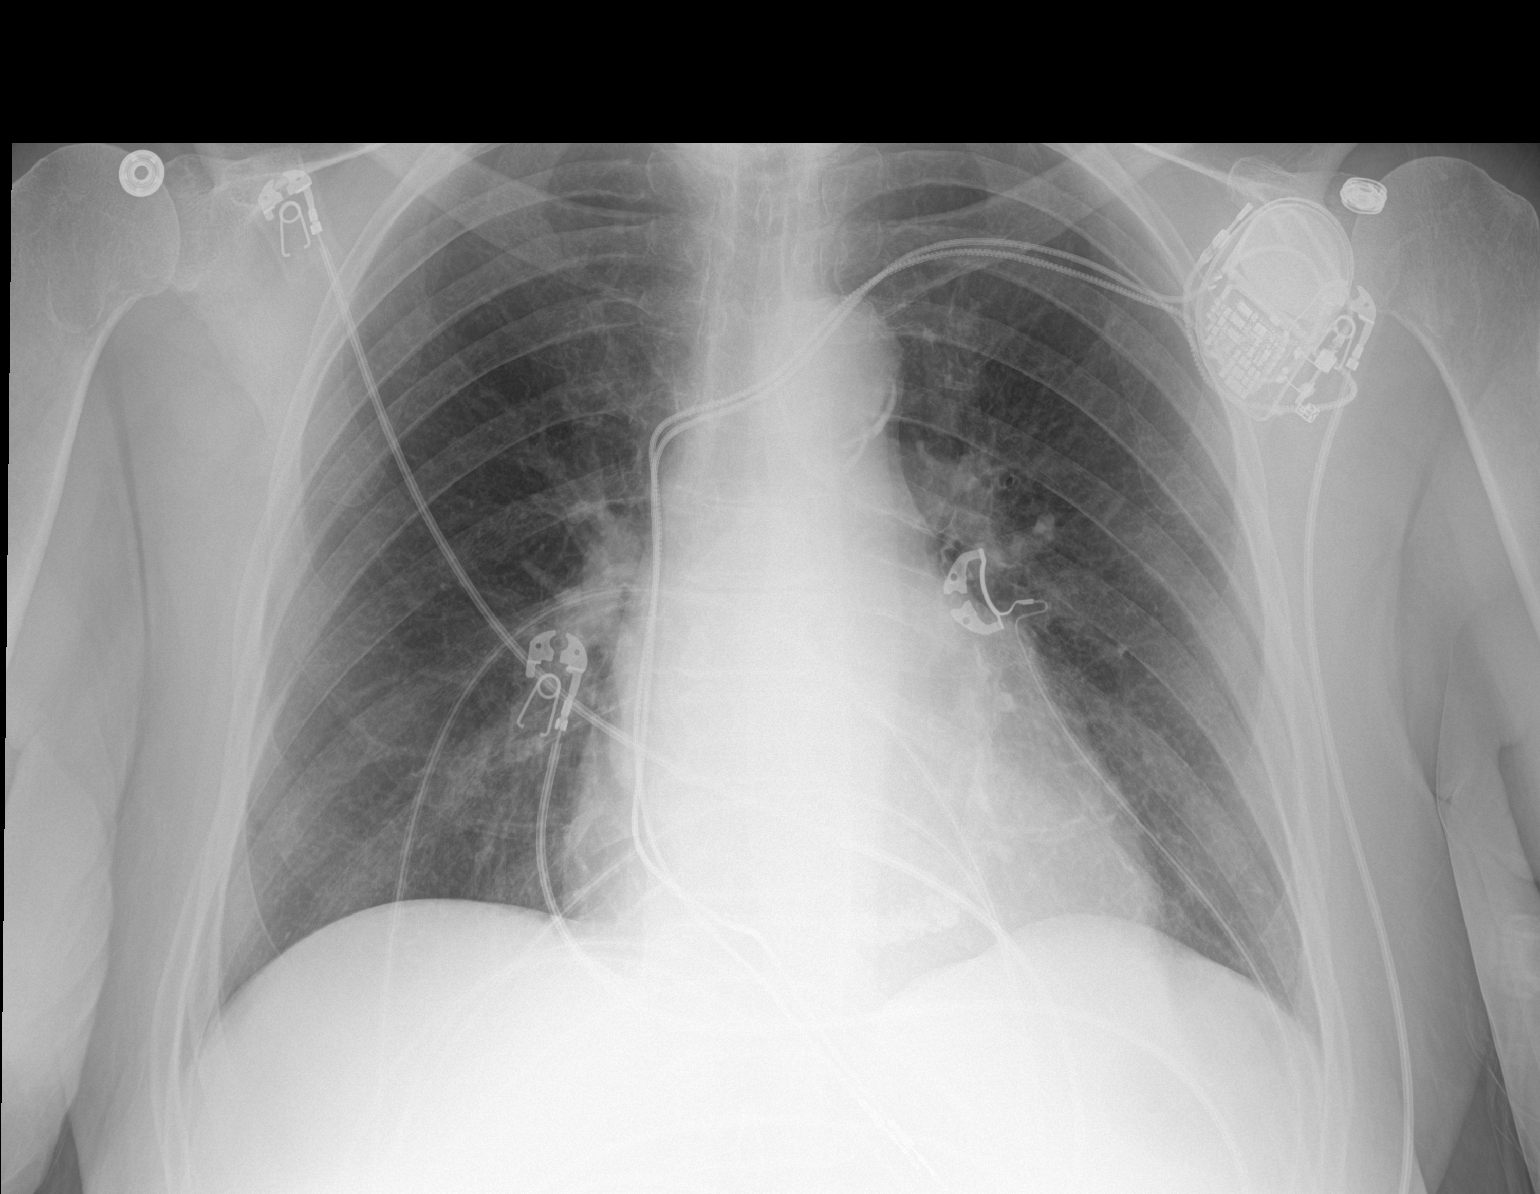

[1 of 1 positions shown; findings below may reference images not displayed]

FINDINGS: No consolidation, features of edema, pneumothorax, or effusion. The
aorta is calcified. Pacer pack overlies left chest wall with paired
leads terminating at the cardiac apex in a configuration similar to
prior counting for differences in positioning. Dense mitral annular
calcifications are present. The remaining cardiomediastinal contours
are unremarkable. No acute osseous or soft tissue abnormality.
Degenerative changes are present in the imaged spine and shoulders.
IMPRESSION: 1. No acute cardiopulmonary abnormality.
2. Calcification of the mitral annulus, consider outpatient
assessment with echocardiography as warranted to assess for valvular
dysfunction.
3.  Aortic Atherosclerosis (L5HVC-GUI.I).

## 2022-05-19 DIAGNOSIS — I77810 Thoracic aortic ectasia: Secondary | ICD-10-CM | POA: Diagnosis not present

## 2022-05-19 DIAGNOSIS — I10 Essential (primary) hypertension: Secondary | ICD-10-CM | POA: Diagnosis not present

## 2022-05-19 DIAGNOSIS — Z23 Encounter for immunization: Secondary | ICD-10-CM | POA: Diagnosis not present

## 2022-05-19 DIAGNOSIS — Z7901 Long term (current) use of anticoagulants: Secondary | ICD-10-CM | POA: Diagnosis not present

## 2022-05-19 DIAGNOSIS — I421 Obstructive hypertrophic cardiomyopathy: Secondary | ICD-10-CM | POA: Diagnosis not present

## 2022-05-19 DIAGNOSIS — I129 Hypertensive chronic kidney disease with stage 1 through stage 4 chronic kidney disease, or unspecified chronic kidney disease: Secondary | ICD-10-CM | POA: Diagnosis not present

## 2022-05-19 DIAGNOSIS — N1831 Chronic kidney disease, stage 3a: Secondary | ICD-10-CM | POA: Diagnosis not present

## 2022-05-19 DIAGNOSIS — G3184 Mild cognitive impairment, so stated: Secondary | ICD-10-CM | POA: Diagnosis not present

## 2022-05-19 DIAGNOSIS — Z950 Presence of cardiac pacemaker: Secondary | ICD-10-CM | POA: Diagnosis not present

## 2022-05-19 DIAGNOSIS — Z0000 Encounter for general adult medical examination without abnormal findings: Secondary | ICD-10-CM | POA: Diagnosis not present

## 2022-05-19 DIAGNOSIS — I340 Nonrheumatic mitral (valve) insufficiency: Secondary | ICD-10-CM | POA: Diagnosis not present

## 2022-05-19 DIAGNOSIS — R82998 Other abnormal findings in urine: Secondary | ICD-10-CM | POA: Diagnosis not present

## 2022-05-19 DIAGNOSIS — D472 Monoclonal gammopathy: Secondary | ICD-10-CM | POA: Diagnosis not present

## 2022-05-19 DIAGNOSIS — I517 Cardiomegaly: Secondary | ICD-10-CM | POA: Diagnosis not present

## 2022-05-30 ENCOUNTER — Ambulatory Visit (INDEPENDENT_AMBULATORY_CARE_PROVIDER_SITE_OTHER): Payer: Medicare Other

## 2022-05-30 DIAGNOSIS — I482 Chronic atrial fibrillation, unspecified: Secondary | ICD-10-CM

## 2022-05-31 ENCOUNTER — Other Ambulatory Visit (HOSPITAL_BASED_OUTPATIENT_CLINIC_OR_DEPARTMENT_OTHER): Payer: Self-pay

## 2022-05-31 DIAGNOSIS — Z23 Encounter for immunization: Secondary | ICD-10-CM | POA: Diagnosis not present

## 2022-05-31 LAB — CUP PACEART REMOTE DEVICE CHECK
Battery Remaining Longevity: 47 mo
Battery Voltage: 2.97 V
Brady Statistic RV Percent Paced: 99.83 %
Date Time Interrogation Session: 20231023115201
Implantable Lead Connection Status: 753985
Implantable Lead Implant Date: 20170320
Implantable Lead Location: 753860
Implantable Lead Model: 5076
Implantable Pulse Generator Implant Date: 20170320
Lead Channel Impedance Value: 456 Ohm
Lead Channel Impedance Value: 494 Ohm
Lead Channel Pacing Threshold Amplitude: 0.625 V
Lead Channel Pacing Threshold Pulse Width: 0.4 ms
Lead Channel Sensing Intrinsic Amplitude: 14.875 mV
Lead Channel Sensing Intrinsic Amplitude: 14.875 mV
Lead Channel Setting Pacing Amplitude: 2 V
Lead Channel Setting Pacing Pulse Width: 0.4 ms
Lead Channel Setting Sensing Sensitivity: 2 mV
Zone Setting Status: 755011

## 2022-05-31 MED ORDER — COMIRNATY 30 MCG/0.3ML IM SUSY
PREFILLED_SYRINGE | INTRAMUSCULAR | 0 refills | Status: AC
  Filled 2022-05-31: qty 0.3, 1d supply, fill #0

## 2022-06-20 NOTE — Progress Notes (Signed)
Remote pacemaker transmission.   

## 2022-06-22 ENCOUNTER — Other Ambulatory Visit: Payer: Self-pay | Admitting: Cardiology

## 2022-07-19 DIAGNOSIS — R413 Other amnesia: Secondary | ICD-10-CM | POA: Diagnosis not present

## 2022-07-19 DIAGNOSIS — M79601 Pain in right arm: Secondary | ICD-10-CM | POA: Diagnosis not present

## 2022-07-19 DIAGNOSIS — G3184 Mild cognitive impairment, so stated: Secondary | ICD-10-CM | POA: Diagnosis not present

## 2022-07-20 DIAGNOSIS — H26493 Other secondary cataract, bilateral: Secondary | ICD-10-CM | POA: Diagnosis not present

## 2022-07-20 DIAGNOSIS — H524 Presbyopia: Secondary | ICD-10-CM | POA: Diagnosis not present

## 2022-07-20 DIAGNOSIS — H5212 Myopia, left eye: Secondary | ICD-10-CM | POA: Diagnosis not present

## 2022-07-20 DIAGNOSIS — H43813 Vitreous degeneration, bilateral: Secondary | ICD-10-CM | POA: Diagnosis not present

## 2022-07-20 DIAGNOSIS — H348311 Tributary (branch) retinal vein occlusion, right eye, with retinal neovascularization: Secondary | ICD-10-CM | POA: Diagnosis not present

## 2022-08-29 ENCOUNTER — Ambulatory Visit: Payer: Medicare Other | Attending: Internal Medicine

## 2022-08-29 DIAGNOSIS — R001 Bradycardia, unspecified: Secondary | ICD-10-CM | POA: Diagnosis not present

## 2022-08-30 DIAGNOSIS — N390 Urinary tract infection, site not specified: Secondary | ICD-10-CM | POA: Diagnosis not present

## 2022-08-30 LAB — CUP PACEART REMOTE DEVICE CHECK
Battery Remaining Longevity: 42 mo
Battery Voltage: 2.96 V
Brady Statistic RV Percent Paced: 99.75 %
Date Time Interrogation Session: 20240122162008
Implantable Lead Connection Status: 753985
Implantable Lead Implant Date: 20170320
Implantable Lead Location: 753860
Implantable Lead Model: 5076
Implantable Pulse Generator Implant Date: 20170320
Lead Channel Impedance Value: 437 Ohm
Lead Channel Impedance Value: 475 Ohm
Lead Channel Pacing Threshold Amplitude: 0.625 V
Lead Channel Pacing Threshold Pulse Width: 0.4 ms
Lead Channel Sensing Intrinsic Amplitude: 19 mV
Lead Channel Sensing Intrinsic Amplitude: 19 mV
Lead Channel Setting Pacing Amplitude: 2 V
Lead Channel Setting Pacing Pulse Width: 0.4 ms
Lead Channel Setting Sensing Sensitivity: 2 mV
Zone Setting Status: 755011

## 2022-10-04 DIAGNOSIS — R5383 Other fatigue: Secondary | ICD-10-CM | POA: Diagnosis not present

## 2022-10-04 DIAGNOSIS — R0981 Nasal congestion: Secondary | ICD-10-CM | POA: Diagnosis not present

## 2022-10-04 DIAGNOSIS — J029 Acute pharyngitis, unspecified: Secondary | ICD-10-CM | POA: Diagnosis not present

## 2022-10-04 DIAGNOSIS — Z1152 Encounter for screening for COVID-19: Secondary | ICD-10-CM | POA: Diagnosis not present

## 2022-10-04 DIAGNOSIS — R059 Cough, unspecified: Secondary | ICD-10-CM | POA: Diagnosis not present

## 2022-10-04 DIAGNOSIS — J069 Acute upper respiratory infection, unspecified: Secondary | ICD-10-CM | POA: Diagnosis not present

## 2022-10-13 NOTE — Progress Notes (Signed)
Remote pacemaker transmission.   

## 2022-11-28 ENCOUNTER — Ambulatory Visit (INDEPENDENT_AMBULATORY_CARE_PROVIDER_SITE_OTHER): Payer: Medicare Other

## 2022-11-28 DIAGNOSIS — R001 Bradycardia, unspecified: Secondary | ICD-10-CM

## 2022-11-28 LAB — CUP PACEART REMOTE DEVICE CHECK
Battery Remaining Longevity: 38 mo
Battery Voltage: 2.96 V
Brady Statistic RV Percent Paced: 99.66 %
Date Time Interrogation Session: 20240422081200
Implantable Lead Connection Status: 753985
Implantable Lead Implant Date: 20170320
Implantable Lead Location: 753860
Implantable Lead Model: 5076
Implantable Pulse Generator Implant Date: 20170320
Lead Channel Impedance Value: 475 Ohm
Lead Channel Impedance Value: 494 Ohm
Lead Channel Pacing Threshold Amplitude: 0.75 V
Lead Channel Pacing Threshold Pulse Width: 0.4 ms
Lead Channel Sensing Intrinsic Amplitude: 15.75 mV
Lead Channel Sensing Intrinsic Amplitude: 15.75 mV
Lead Channel Setting Pacing Amplitude: 2 V
Lead Channel Setting Pacing Pulse Width: 0.4 ms
Lead Channel Setting Sensing Sensitivity: 2 mV
Zone Setting Status: 755011

## 2022-12-29 NOTE — Progress Notes (Signed)
Remote pacemaker transmission.   

## 2023-01-10 DIAGNOSIS — R7301 Impaired fasting glucose: Secondary | ICD-10-CM | POA: Diagnosis not present

## 2023-01-10 DIAGNOSIS — I4891 Unspecified atrial fibrillation: Secondary | ICD-10-CM | POA: Diagnosis not present

## 2023-01-10 DIAGNOSIS — G3184 Mild cognitive impairment, so stated: Secondary | ICD-10-CM | POA: Diagnosis not present

## 2023-01-10 DIAGNOSIS — D472 Monoclonal gammopathy: Secondary | ICD-10-CM | POA: Diagnosis not present

## 2023-01-10 DIAGNOSIS — E785 Hyperlipidemia, unspecified: Secondary | ICD-10-CM | POA: Diagnosis not present

## 2023-01-10 DIAGNOSIS — D6869 Other thrombophilia: Secondary | ICD-10-CM | POA: Diagnosis not present

## 2023-01-10 DIAGNOSIS — I421 Obstructive hypertrophic cardiomyopathy: Secondary | ICD-10-CM | POA: Diagnosis not present

## 2023-01-10 DIAGNOSIS — E039 Hypothyroidism, unspecified: Secondary | ICD-10-CM | POA: Diagnosis not present

## 2023-01-10 DIAGNOSIS — M8580 Other specified disorders of bone density and structure, unspecified site: Secondary | ICD-10-CM | POA: Diagnosis not present

## 2023-01-10 DIAGNOSIS — I129 Hypertensive chronic kidney disease with stage 1 through stage 4 chronic kidney disease, or unspecified chronic kidney disease: Secondary | ICD-10-CM | POA: Diagnosis not present

## 2023-01-10 DIAGNOSIS — N1831 Chronic kidney disease, stage 3a: Secondary | ICD-10-CM | POA: Diagnosis not present

## 2023-01-10 DIAGNOSIS — I340 Nonrheumatic mitral (valve) insufficiency: Secondary | ICD-10-CM | POA: Diagnosis not present

## 2023-02-22 DIAGNOSIS — Z85828 Personal history of other malignant neoplasm of skin: Secondary | ICD-10-CM | POA: Diagnosis not present

## 2023-02-22 DIAGNOSIS — L814 Other melanin hyperpigmentation: Secondary | ICD-10-CM | POA: Diagnosis not present

## 2023-02-22 DIAGNOSIS — L821 Other seborrheic keratosis: Secondary | ICD-10-CM | POA: Diagnosis not present

## 2023-02-22 DIAGNOSIS — L570 Actinic keratosis: Secondary | ICD-10-CM | POA: Diagnosis not present

## 2023-02-27 ENCOUNTER — Ambulatory Visit (INDEPENDENT_AMBULATORY_CARE_PROVIDER_SITE_OTHER): Payer: Medicare Other

## 2023-02-27 DIAGNOSIS — R001 Bradycardia, unspecified: Secondary | ICD-10-CM

## 2023-02-28 LAB — CUP PACEART REMOTE DEVICE CHECK
Battery Remaining Longevity: 41 mo
Battery Voltage: 2.96 V
Brady Statistic RV Percent Paced: 99.3 %
Date Time Interrogation Session: 20240722153255
Implantable Lead Connection Status: 753985
Implantable Lead Implant Date: 20170320
Implantable Lead Location: 753860
Implantable Lead Model: 5076
Implantable Pulse Generator Implant Date: 20170320
Lead Channel Impedance Value: 475 Ohm
Lead Channel Impedance Value: 513 Ohm
Lead Channel Pacing Threshold Amplitude: 0.625 V
Lead Channel Pacing Threshold Pulse Width: 0.4 ms
Lead Channel Sensing Intrinsic Amplitude: 14.75 mV
Lead Channel Sensing Intrinsic Amplitude: 14.75 mV
Lead Channel Setting Pacing Amplitude: 2 V
Lead Channel Setting Pacing Pulse Width: 0.4 ms
Lead Channel Setting Sensing Sensitivity: 2 mV
Zone Setting Status: 755011

## 2023-03-07 DIAGNOSIS — H04123 Dry eye syndrome of bilateral lacrimal glands: Secondary | ICD-10-CM | POA: Diagnosis not present

## 2023-03-08 NOTE — Progress Notes (Signed)
Remote pacemaker transmission.   

## 2023-03-15 DIAGNOSIS — L244 Irritant contact dermatitis due to drugs in contact with skin: Secondary | ICD-10-CM | POA: Diagnosis not present

## 2023-03-15 DIAGNOSIS — Z85828 Personal history of other malignant neoplasm of skin: Secondary | ICD-10-CM | POA: Diagnosis not present

## 2023-04-03 NOTE — Progress Notes (Deleted)
Cardiology Office Note:    Date:  04/03/2023   ID:  Tracey Mcclure, DOB Dec 28, 1940, MRN [id number]  PCP:  Tracey Ran, MD  Cardiologist:  Tracey Swaziland, MD  Electrophysiologist:  None   Referring MD: Tracey Ran, MD   Chief Complaint: follow-up of hypertrophic cardiomyopathy and atrial fibrillation  History of Present Illness:    Tracey Mcclure is a 82 y.o. female with a history of coronary artery calcifications noted on chest CTA in 05/2020, hypertrophic cardiomyopathy without LVOT obstruction, paroxysmal atrial fibrillation on Eliquis, symptomatic bradycardia with intermittent complete heart block s/p PPM in 10/2015 and then lead revision due to lead fracture in 08/2017, mitral regurgitation, hyperlipidemia, hypothyroidism, GERD, iron deficiency anemia, colon cancer, and MGUS who is followed by Dr. Swaziland and presents today for routine follow-up.   Remote Myoview in 09/2003 was normal. Renal dopplers in 11/2014 showed 1-59% stenosis of right renal artery but no evidence of any disease on the left. Last Echo in 05/2020 showed LVEF of 65-70% with normal wall motion and severe asymmetric LVH of the basal-septal segment but no significant LVOT gradient (consistent with hypertrophic cardiomyopathy) as well as normal VR, moderate MR, moderate TR, and dilatation of the ascending aorta measuring 43 mm. Chest CTA at that time for further evaluation of ascending aorta showed a mild fusiform aneurysmal dilatation of the ascending thoracic aorta with a maximum diameter of 4.1 cm. CTA also showed coronary calcifications.   Patient was last seen by Dr. Swaziland in 04/2022 at which time she reported lack of energy and a declined in her memory but was doing well from a cardiac standpoint.   Patient presents today for follow-up. ***  Hypertrophic Cardiomyopathy Last Echo in 2021 showed LVEF of 65-70% with normal wall motion and severe asymmetric LVH of the basal-septal segment but no significant LVOT gradient  (consistent with hypertrophic cardiomyopathy) as well as normal VR, moderate MR, moderate TR, and dilatation of the ascending aorta measuring 43 mm. - Euvolemic on exam. - Previously on Toprol-XL but this was stopped due to fatigue and low BP.   Paroxysmal Atrial Fibrillation Intermittent Complete Heart Block s/p [employer] PPM Patient has a history of paroxysmal atrial fibrillation as well as sinus bradycardia with with second degree Mobitz type I AV block as well as intermittent complete heart block. She underwent placement of PPM in 2017 and then had a lead revision due to lead fracture in 2019.  - Maintaining sinus rhythm. *** - Previously on Toprol-XL but this was stopped due to fatigue and low BP. - Continue Eliquis 5mg  twice daily.  - Device followed by Dr. Ladona Mcclure.  Coronary Artery Calcifications Noted on chest CTA in 05/2020.  - No chest pain. *** - No aspirin due to need for DOAC.  - Continue statin.  Moderate Mitral Regurgitation Moderate Tricuspid Regurgitation Noted on Echo in 2021.  - Will repeat Echo for routine monitoring. ***  Ascending Thoracic Aorta Aneurysm Echo in 05/2020 showed dilatation of the ascending aorta measuring 43 mm. Chest CTA at that time showed mild fusiform aneurysmal dilatation of the ascending thoracic aorta with a maximum diameter of 4.1 cm. - Echo vs chest CTA given age. ***  Hypertension BP well controlled. *** - Continue current medications: Losartan 25mg  daily and HCTZ 12.5mg  daily.  Hyperlipidemia Lipid panel in ***: - Continue Lipitor 40mg  daily.    EKGs/Labs/Other Studies Reviewed:    The following studies were reviewed:  Echocardiogram 05/18/2020: Impressions: 1. Left ventricular ejection fraction, by estimation,  is 65 to 70%. The  left ventricle has normal function. The left ventricle has no regional  wall motion abnormalities. There is severe asymmetric left ventricular  hypertrophy of the basal-septal  segment. No  significant LVOT gradient at rest. Left ventricular diastolic  parameters are indeterminate.   2. Right ventricular systolic function is normal. The right ventricular  size is normal. There is mildly elevated pulmonary artery systolic  pressure. The estimated right ventricular systolic pressure is 35.3 mmHg.   3. Left atrial size was severely dilated.   4. The mitral valve is abnormal. Moderate mitral valve regurgitation. No  evidence of mitral stenosis. Severe mitral annular calcification.   5. Tricuspid valve regurgitation is moderate.   6. The aortic valve is tricuspid. Aortic valve regurgitation is trivial.  No aortic stenosis is present.   7. Aortic dilatation noted. There is dilatation of the ascending aorta,  measuring 43 mm.   8. The inferior vena cava is normal in size with greater than 50%  respiratory variability, suggesting right atrial pressure of 3 mmHg.    EKG:  EKG  ordered today. EKG personally reviewed and demonstrates ***.  Recent Labs: No results found for requested labs within last 365 days.  Recent Lipid Panel No results found for: "CHOL", "TRIG", "HDL", "CHOLHDL", "VLDL", "LDLCALC", "LDLDIRECT"  Physical Exam:    Vital Signs: LMP  (LMP Unknown)     Wt Readings from Last 3 Encounters:  04/14/22 168 lb 3.2 oz (76.3 kg)  07/06/21 163 lb (73.9 kg)  05/25/21 164 lb 12.8 oz (74.8 kg)     General: 82 y.o. female in no acute distress. HEENT: Normocephalic and atraumatic. Sclera clear. EOMs intact. Neck: Supple. No carotid bruits. No JVD. Heart: *** RRR. Distinct S1 and S2. No murmurs, gallops, or rubs. Radial and distal pedal pulses 2+ and equal bilaterally. Lungs: No increased work of breathing. Clear to ausculation bilaterally. No wheezes, rhonchi, or rales.  Abdomen: Soft, non-distended, and non-tender to palpation. Bowel sounds present in all 4 quadrants.  MSK: Normal strength and tone for age. *** Extremities: No lower extremity edema.    Skin: Warm and  dry. Neuro: Alert and oriented x3. No focal deficits. Psych: Normal affect. Responds appropriately.   Assessment:    No diagnosis found.  Plan:     Disposition: Follow up in ***   Medication Adjustments/Labs and Tests Ordered: Current medicines are reviewed at length with the patient today.  Concerns regarding medicines are outlined above.  No orders of the defined types were placed in this encounter.  No orders of the defined types were placed in this encounter.   There are no Patient Instructions on file for this visit.   Signed, Corrin Parker, PA-C  04/03/2023 5:18 PM    [facility]

## 2023-04-16 NOTE — Progress Notes (Unsigned)
Cardiology Office Note:  .   Date:  04/17/2023  ID:  Tracey Mcclure, DOB [birth year], MRN [id number] PCP: Rodrigo Ran, MD  [facility] Providers Cardiologist:  Peter Swaziland, MD    History of Present Illness: [name] Kitchen   Tracey Mcclure is a 82 y.o. 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 She is followed by Dr. Swaziland, she presents today for routine follow up.   She had a Myoview in 09/2003 which was normal, renal Dopplers in 11/2014 showed 1 to 59% stenosis of the right renal artery but no evidence of any disease on the left.  Underwent echocardiogram in 05/2020 that showed an LVEF of 65 to 70% with normal wall motion and severe asymmetric LVH of the basal septal segment but no significant LVOT gradient, consistent with hypertrophic cardiomyopathy as well as normal RV function, moderate MR, moderate TR and dilation of the ascending aorta measuring 43 mm.  Chest CT at that time her evaluation indicated ascending aorta showed a mild fusiform aneurysmal dilation of the ascending thoracic aorta with a maximum diameter of 4.1 cm, CTA also showed coronary calcifications.  Tracey Mcclure was last seen by Dr. Swaziland in 04/2022.  At that time she had noted a lack of energy and a slight decline in her memory but have been stable from a cardiac standpoint.    Today she presents for follow-up.  She reports that she is doing well.  She has no concerns or complaints today.  She denies chest pain, shortness of breath, orthopnea or PND.  She does note occasional lower extremity edema associated with prolonged sitting or standing on her  feet, relieved with elevation.  Notes that she tries to stay active in her home with multiple 5-minute workouts a day.  She tries to stay active and social by meeting with her neighbors.  Recent labs 01/10/23:  HGB 13.7, HCT 42.1  Creatinine 0.8, sodium 134, potassium 4.3 AST 28, ALT 12 TSH 0.22  ROS: Today she denies chest pain, shortness of breath, lower extremity edema, fatigue, palpitations, melena, hematuria, hemoptysis, diaphoresis, weakness, presyncope, syncope, orthopnea, and PND.   Studies Reviewed: .       Cardiac Studies & Procedures     STRESS TESTS  NM MYOCAR MULTI W/SPECT W 09/17/2003   ECHOCARDIOGRAM  ECHOCARDIOGRAM COMPLETE 05/18/2020  Narrative ECHOCARDIOGRAM REPORT    Patient Name:   Tracey Mcclure Date of Exam: 05/18/2020 Medical Rec #:  [id number]      Height:       64.5 in Accession #:    [id number]     Weight:       178.2 lb Date of Birth:  11-28-1940     BSA:          1.873 m Patient Age:    78 years       BP:           160/90 mmHg Patient Gender: F              HR:           74 bpm. Exam Location:  [address]  Procedure: 2D Echo, Cardiac Doppler and Color Doppler  Indications:    I42.1  HOCM  History:        Patient has prior history of Echocardiogram examinations, most recent 08/17/2015. Pacemaker, Arrythmias:Atrial Fibrillation; Risk Factors:Hypertension and Dyslipidemia. Palpitations. Sinus bradycardia. Hypothyroidism. Obesity. Nonrheumatic mitral valve regurgitation.  Sonographer:    Cathie Beams RCS Referring Phys: [phone] PETER M Swaziland  IMPRESSIONS   1. Left ventricular ejection fraction, by estimation, is 65 to 70%. The left ventricle has normal function. The left ventricle has no regional wall motion abnormalities. There is severe asymmetric left ventricular hypertrophy of the basal-septal segment. No significant LVOT gradient at rest. Left ventricular diastolic parameters are indeterminate. 2. Right ventricular systolic function is  normal. The right ventricular size is normal. There is mildly elevated pulmonary artery systolic pressure. The estimated right ventricular systolic pressure is 35.3 mmHg. 3. Left atrial size was severely dilated. 4. The mitral valve is abnormal. Moderate mitral valve regurgitation. No evidence of mitral stenosis. Severe mitral annular calcification. 5. Tricuspid valve regurgitation is moderate. 6. The aortic valve is tricuspid. Aortic valve regurgitation is trivial. No aortic stenosis is present. 7. Aortic dilatation noted. There is dilatation of the ascending aorta, measuring 43 mm. 8. The inferior vena cava is normal in size with greater than 50% respiratory variability, suggesting right atrial pressure of 3 mmHg.  FINDINGS Left Ventricle: Left ventricular ejection fraction, by estimation, is 65 to 70%. The left ventricle has normal function. The left ventricle has no regional wall motion abnormalities. The left ventricular internal cavity size was small. There is severe asymmetric left ventricular hypertrophy of the basal-septal segment. Left ventricular diastolic parameters are indeterminate.  Right Ventricle: The right ventricular size is normal. Right vetricular wall thickness was not assessed. Right ventricular systolic function is normal. There is mildly elevated pulmonary artery systolic pressure. The tricuspid regurgitant velocity is 2.84 m/s, and with an assumed right atrial pressure of 3 mmHg, the estimated right ventricular systolic pressure is 35.3 mmHg.  Left Atrium: Left atrial size was severely dilated.  Right Atrium: Right atrial size was normal in size.  Pericardium: There is no evidence of pericardial effusion.  Mitral Valve: The mitral valve is abnormal. Severe mitral annular calcification. Moderate mitral valve regurgitation. No evidence of mitral valve stenosis.  Tricuspid Valve: The tricuspid valve is normal in structure. Tricuspid valve regurgitation is  moderate.  Aortic Valve: The aortic valve is tricuspid. Aortic valve regurgitation is trivial. No aortic stenosis is present.  Pulmonic Valve: The pulmonic valve was not well visualized. Pulmonic valve regurgitation is trivial.  Aorta: The aortic root is normal in size and structure and aortic dilatation noted. There is dilatation of the ascending aorta, measuring 43 mm.  Venous: The inferior vena cava is normal in size with greater than 50% respiratory variability, suggesting right atrial pressure of 3 mmHg.  IAS/Shunts: No atrial level shunt detected by color flow Doppler.   LEFT VENTRICLE PLAX 2D LVIDd:         3.40 cm  Diastology LVIDs:         2.10 cm  LV e' medial:  5.22 cm/s LV PW:         1.90 cm  LV e' lateral: 13.67 cm/s LV IVS:        2.20 cm LVOT diam:     1.90 cm LV SV:         62 LV SV Index:   33 LVOT Area:     2.84 cm   RIGHT VENTRICLE RV Basal diam:  2.40 cm RV S prime:  9.89 cm/s TAPSE (M-mode): 1.6 cm RVSP:           35.3 mmHg  LEFT ATRIUM              Index       RIGHT ATRIUM           Index LA diam:        5.80 cm  3.10 cm/m  RA Pressure: 3.00 mmHg LA Vol (A2C):   145.0 ml 77.43 ml/m RA Area:     16.70 cm LA Vol (A4C):   133.0 ml 71.02 ml/m RA Volume:   42.10 ml  22.48 ml/m LA Biplane Vol: 140.0 ml 74.76 ml/m AORTIC VALVE LVOT Vmax:   94.17 cm/s LVOT Vmean:  66.933 cm/s LVOT VTI:    0.217 m  AORTA Ao Root diam: 3.30 cm  MR Peak grad:    110.9 mmHg  TRICUSPID VALVE MR Mean grad:    64.0 mmHg   TR Peak grad:   32.3 mmHg MR Vmax:         526.60 cm/s TR Vmax:        284.00 cm/s MR Vmean:        370.0 cm/s  Estimated RAP:  3.00 mmHg MR PISA:         2.26 cm    RVSP:           35.3 mmHg MR PISA Eff ROA: 15 mm MR PISA Radius:  0.60 cm     SHUNTS Systemic VTI:  0.22 m Systemic Diam: 1.90 cm  Epifanio Lesches MD Electronically signed by Epifanio Lesches MD Signature Date/Time: 05/18/2020/6:27:19 PM    Final               Risk Assessment/Calculations:    CHA2DS2-VASc Score = 4   This indicates a 4.8% annual risk of stroke. The patient's score is based upon: CHF History: 0 HTN History: 1 Diabetes History: 0 Stroke History: 0 Vascular Disease History: 0 Age Score: 2 Gender Score: 1            Physical Exam:   VS:  BP 132/78   Pulse 90   Ht 5\' 6"  (1.676 m)   Wt 171 lb (77.6 kg)   LMP  (LMP Unknown)   SpO2 100%   BMI 27.60 kg/m    Wt Readings from Last 3 Encounters:  04/17/23 171 lb (77.6 kg)  04/14/22 168 lb 3.2 oz (76.3 kg)  07/06/21 163 lb (73.9 kg)    GEN: Well nourished, well developed in no acute distress NECK: No JVD; No carotid bruits CARDIAC: RRR,  grade 2/6 systolic murmur, no rubs, gallops RESPIRATORY:  Clear to auscultation without rales, wheezing or rhonchi  ABDOMEN: Soft, non-tender, non-distended EXTREMITIES:  No edema; No deformity     ASSESSMENT AND PLAN: .    Hypertrophic cardiomyopathy: Her last echo in 2021 showed an LVEF of 65 to 70% with normal wall motion and severe asymmetric LVH of the basal septal segment but no significant LVOT gradient, consistent with hypertrophic cardiomyopathy.  Today she denies shortness of breath, orthopnea or PND.  She does note intermittent lower extremity edema with prolonged standing or sitting, relieves with elevation. She is euvolemic and well compensated on exam. Previously on Toprol XL, this was stopped in the setting of fatigue and low blood pressures.  Continue HCTZ 0.5 mg daily and losartan 25 mg daily. Will repeat echo for routine monitoring.   Paroxysmal atrial fibrillation/CHB s/p [employer] PPM: She has a  history of PAF and sinus bradycardia, second-degree Mobitz type I AV block as well as intermittent complete heart block.  2017 she underwent placement of PPM, she then had a lead revision in 2019 due to a lead fracture. EKG today indicates ventricular paced rhythm at 90 bpm. Device is followed by Dr. Ladona Ridgel.  She denies bleeding  problems on Eliquis. Continue Eliquis 5 mg twice daily.  Coronary artery calcifications: Chest CTA in 05/2020 indicated coronary artery calcifications. Stable with no anginal symptoms. No indication for ischemic evaluation.  No aspirin to need for DOAC. Continue atorvastatin 40 mg daily.   Moderate mitral regurgitation, tricuspid regurgitation:  Noted on echo in 2021.  She denies chest pain or shortness of breath. Will repeat echo for routine monitoring.   Ascending thoracic aortic aneurysm: Echocardiogram in 05/2020 indicated dilation of the ascending aorta measuring 43 mm.  Follow-up chest CTA at that time indicated mild fusiform aneurysmal dilation of the ascending thoracic aorta with a maximum diameter of 4.1 cm. Patient is unsure if she would have any invasive procedure in the future, will start with repeat echo then will determine if she needs chest CTA. Repeat echocardiogram for monitoring.   Hypertension: Initial blood pressure today 142/74, on recheck was 132/78. She reports home blood pressure 120/70. Continue Losartan 25 mg daily and HCTZ 12.5 mg daily  Hyperlipidemia: Last lipid profile on 05/04/22 indicated cholesterol 145, triglycerides 71, HDL 73, LDL 58. Continue Lipitor 40 mg daily. Discussed repeat lipid profile for monitoring, patient deferred.   Hypothyroidism: Last TSH 0.22 on 01/10/23. On Synthroid. Monitored and managed by her PCP.        Dispo: Follow up with Reather Littler, NP in 6 months. Follow up with Dr. Swaziland in one year.   Signed, Rip Harbour, NP

## 2023-04-17 ENCOUNTER — Encounter: Payer: Self-pay | Admitting: Student

## 2023-04-17 ENCOUNTER — Ambulatory Visit: Payer: Medicare Other | Attending: Student | Admitting: Cardiology

## 2023-04-17 VITALS — BP 132/78 | HR 90 | Ht 66.0 in | Wt 171.0 lb

## 2023-04-17 DIAGNOSIS — I340 Nonrheumatic mitral (valve) insufficiency: Secondary | ICD-10-CM | POA: Insufficient documentation

## 2023-04-17 DIAGNOSIS — R001 Bradycardia, unspecified: Secondary | ICD-10-CM | POA: Insufficient documentation

## 2023-04-17 DIAGNOSIS — I361 Nonrheumatic tricuspid (valve) insufficiency: Secondary | ICD-10-CM | POA: Diagnosis present

## 2023-04-17 DIAGNOSIS — I10 Essential (primary) hypertension: Secondary | ICD-10-CM | POA: Insufficient documentation

## 2023-04-17 DIAGNOSIS — I480 Paroxysmal atrial fibrillation: Secondary | ICD-10-CM | POA: Insufficient documentation

## 2023-04-17 DIAGNOSIS — I422 Other hypertrophic cardiomyopathy: Secondary | ICD-10-CM | POA: Insufficient documentation

## 2023-04-17 DIAGNOSIS — I441 Atrioventricular block, second degree: Secondary | ICD-10-CM | POA: Insufficient documentation

## 2023-04-17 NOTE — Patient Instructions (Signed)
Medication Instructions:  Your physician recommends that you continue on your current medications as directed. Please refer to the Current Medication list given to you today.  *If you need a refill on your cardiac medications before your next appointment, please call your pharmacy*  Testing/Procedures: Your physician has requested that you have an echocardiogram. Echocardiography is a painless test that uses sound waves to create images of your heart. It provides your doctor with information about the size and shape of your heart and how well your heart's chambers and valves are working. This procedure takes approximately one hour. There are no restrictions for this procedure. Please do NOT wear cologne, perfume, aftershave, or lotions (deodorant is allowed). Please arrive 15 minutes prior to your appointment time.  Follow-Up: At [facility], you and your health needs are our priority.  As part of our continuing mission to provide you with exceptional heart care, we have created designated Provider Care Teams.  These Care Teams include your primary Cardiologist (physician) and Advanced Practice Providers (APPs -  Physician Assistants and Nurse Practitioners) who all work together to provide you with the care you need, when you need it.  Your next appointment:   6 months  Provider:   Reather Littler, NP      Then, Peter Swaziland, MD will plan to see you again in 1 year(s).

## 2023-04-19 DIAGNOSIS — H26493 Other secondary cataract, bilateral: Secondary | ICD-10-CM | POA: Diagnosis not present

## 2023-04-19 DIAGNOSIS — H348311 Tributary (branch) retinal vein occlusion, right eye, with retinal neovascularization: Secondary | ICD-10-CM | POA: Diagnosis not present

## 2023-04-28 ENCOUNTER — Other Ambulatory Visit (HOSPITAL_BASED_OUTPATIENT_CLINIC_OR_DEPARTMENT_OTHER): Payer: Self-pay

## 2023-04-28 DIAGNOSIS — Z23 Encounter for immunization: Secondary | ICD-10-CM | POA: Diagnosis not present

## 2023-04-28 MED ORDER — COVID-19 MRNA VAC-TRIS(PFIZER) 30 MCG/0.3ML IM SUSY
0.3000 mL | PREFILLED_SYRINGE | Freq: Once | INTRAMUSCULAR | 0 refills | Status: AC
  Filled 2023-04-28: qty 0.3, 1d supply, fill #0

## 2023-05-08 ENCOUNTER — Ambulatory Visit (HOSPITAL_COMMUNITY): Payer: Medicare Other | Attending: Cardiology

## 2023-05-08 DIAGNOSIS — I10 Essential (primary) hypertension: Secondary | ICD-10-CM | POA: Diagnosis not present

## 2023-05-08 DIAGNOSIS — R001 Bradycardia, unspecified: Secondary | ICD-10-CM | POA: Insufficient documentation

## 2023-05-08 DIAGNOSIS — I480 Paroxysmal atrial fibrillation: Secondary | ICD-10-CM | POA: Insufficient documentation

## 2023-05-08 DIAGNOSIS — I441 Atrioventricular block, second degree: Secondary | ICD-10-CM | POA: Insufficient documentation

## 2023-05-08 DIAGNOSIS — I422 Other hypertrophic cardiomyopathy: Secondary | ICD-10-CM | POA: Diagnosis not present

## 2023-05-08 DIAGNOSIS — I340 Nonrheumatic mitral (valve) insufficiency: Secondary | ICD-10-CM | POA: Insufficient documentation

## 2023-05-08 DIAGNOSIS — I361 Nonrheumatic tricuspid (valve) insufficiency: Secondary | ICD-10-CM | POA: Insufficient documentation

## 2023-05-08 LAB — ECHOCARDIOGRAM COMPLETE
Area-P 1/2: 3.19 cm2
P 1/2 time: 571 ms
S' Lateral: 2.3 cm

## 2023-05-29 ENCOUNTER — Ambulatory Visit (INDEPENDENT_AMBULATORY_CARE_PROVIDER_SITE_OTHER): Payer: Medicare Other

## 2023-05-29 DIAGNOSIS — R001 Bradycardia, unspecified: Secondary | ICD-10-CM | POA: Diagnosis not present

## 2023-05-30 LAB — CUP PACEART REMOTE DEVICE CHECK
Battery Remaining Longevity: 37 mo
Battery Voltage: 2.95 V
Brady Statistic RV Percent Paced: 99.38 %
Date Time Interrogation Session: 20241021163616
Implantable Lead Connection Status: 753985
Implantable Lead Implant Date: 20170320
Implantable Lead Location: 753860
Implantable Lead Model: 5076
Implantable Pulse Generator Implant Date: 20170320
Lead Channel Impedance Value: 513 Ohm
Lead Channel Impedance Value: 551 Ohm
Lead Channel Pacing Threshold Amplitude: 0.625 V
Lead Channel Pacing Threshold Pulse Width: 0.4 ms
Lead Channel Sensing Intrinsic Amplitude: 15.5 mV
Lead Channel Sensing Intrinsic Amplitude: 15.5 mV
Lead Channel Setting Pacing Amplitude: 2 V
Lead Channel Setting Pacing Pulse Width: 0.4 ms
Lead Channel Setting Sensing Sensitivity: 2 mV
Zone Setting Status: 755011

## 2023-05-31 DIAGNOSIS — Z23 Encounter for immunization: Secondary | ICD-10-CM | POA: Diagnosis not present

## 2023-06-13 NOTE — Progress Notes (Signed)
Remote pacemaker transmission.   

## 2023-07-04 DIAGNOSIS — M8580 Other specified disorders of bone density and structure, unspecified site: Secondary | ICD-10-CM | POA: Diagnosis not present

## 2023-07-04 DIAGNOSIS — N1831 Chronic kidney disease, stage 3a: Secondary | ICD-10-CM | POA: Diagnosis not present

## 2023-07-04 DIAGNOSIS — Z79899 Other long term (current) drug therapy: Secondary | ICD-10-CM | POA: Diagnosis not present

## 2023-07-04 DIAGNOSIS — I129 Hypertensive chronic kidney disease with stage 1 through stage 4 chronic kidney disease, or unspecified chronic kidney disease: Secondary | ICD-10-CM | POA: Diagnosis not present

## 2023-07-04 DIAGNOSIS — E039 Hypothyroidism, unspecified: Secondary | ICD-10-CM | POA: Diagnosis not present

## 2023-07-04 DIAGNOSIS — E785 Hyperlipidemia, unspecified: Secondary | ICD-10-CM | POA: Diagnosis not present

## 2023-07-04 DIAGNOSIS — D509 Iron deficiency anemia, unspecified: Secondary | ICD-10-CM | POA: Diagnosis not present

## 2023-07-04 DIAGNOSIS — R7301 Impaired fasting glucose: Secondary | ICD-10-CM | POA: Diagnosis not present

## 2023-07-11 DIAGNOSIS — Z0000 Encounter for general adult medical examination without abnormal findings: Secondary | ICD-10-CM | POA: Diagnosis not present

## 2023-07-11 DIAGNOSIS — Z7901 Long term (current) use of anticoagulants: Secondary | ICD-10-CM | POA: Diagnosis not present

## 2023-07-11 DIAGNOSIS — I129 Hypertensive chronic kidney disease with stage 1 through stage 4 chronic kidney disease, or unspecified chronic kidney disease: Secondary | ICD-10-CM | POA: Diagnosis not present

## 2023-07-11 DIAGNOSIS — I4891 Unspecified atrial fibrillation: Secondary | ICD-10-CM | POA: Diagnosis not present

## 2023-07-11 DIAGNOSIS — D75839 Thrombocytosis, unspecified: Secondary | ICD-10-CM | POA: Diagnosis not present

## 2023-07-11 DIAGNOSIS — I421 Obstructive hypertrophic cardiomyopathy: Secondary | ICD-10-CM | POA: Diagnosis not present

## 2023-07-11 DIAGNOSIS — D6869 Other thrombophilia: Secondary | ICD-10-CM | POA: Diagnosis not present

## 2023-07-11 DIAGNOSIS — N1831 Chronic kidney disease, stage 3a: Secondary | ICD-10-CM | POA: Diagnosis not present

## 2023-08-23 ENCOUNTER — Ambulatory Visit: Payer: Medicare Other

## 2023-08-23 ENCOUNTER — Ambulatory Visit: Payer: Medicare Other | Attending: Internal Medicine | Admitting: Internal Medicine

## 2023-08-23 ENCOUNTER — Encounter: Payer: Self-pay | Admitting: *Deleted

## 2023-08-23 DIAGNOSIS — T82110A Breakdown (mechanical) of cardiac electrode, initial encounter: Secondary | ICD-10-CM | POA: Diagnosis not present

## 2023-08-23 DIAGNOSIS — I10 Essential (primary) hypertension: Secondary | ICD-10-CM | POA: Diagnosis not present

## 2023-08-23 DIAGNOSIS — R001 Bradycardia, unspecified: Secondary | ICD-10-CM | POA: Diagnosis not present

## 2023-08-23 DIAGNOSIS — T82111A Breakdown (mechanical) of cardiac pulse generator (battery), initial encounter: Secondary | ICD-10-CM | POA: Diagnosis not present

## 2023-08-23 DIAGNOSIS — I4891 Unspecified atrial fibrillation: Secondary | ICD-10-CM | POA: Diagnosis not present

## 2023-08-23 LAB — CUP PACEART INCLINIC DEVICE CHECK
Battery Remaining Longevity: 40 mo
Battery Voltage: 2.95 V
Brady Statistic RV Percent Paced: 97.64 %
Date Time Interrogation Session: 20250115170736
Implantable Lead Connection Status: 753985
Implantable Lead Implant Date: 20170320
Implantable Lead Location: 753860
Implantable Lead Model: 5076
Implantable Pulse Generator Implant Date: 20170320
Lead Channel Impedance Value: 4047 Ohm
Lead Channel Impedance Value: 4047 Ohm
Lead Channel Pacing Threshold Amplitude: 0.75 V
Lead Channel Pacing Threshold Pulse Width: 0.4 ms
Lead Channel Sensing Intrinsic Amplitude: 10 mV
Lead Channel Sensing Intrinsic Amplitude: 8 mV
Lead Channel Setting Sensing Sensitivity: 2.8 mV
Zone Setting Status: 755011

## 2023-08-23 NOTE — Progress Notes (Signed)
 Patient walked-in at [facility] office with a copy of her EKG performed by PCP at [facility] earlier today. EKG shows ventricular rate around 38-40bpm. Last battery estimate was ~3 years as of 05/29/23 transmission. Patient not exhibiting any signs of acute distress and denies any symptoms at this time. She reportedly drove herself to the office, but has a family member with her. Discussed with Dr. Katheryne Pane (DOD). He recommended assessment in ED if EP team is unable to see the patient.   Spoke with Michaelle Adolphus, PA-C, who advised the patient can be seen in the [facility] (Dr. Carolynne Citron and Dr. Marven Slimmer are in the office). Patient accepted Device Clinic appointment at 3:20pm today. Family member stated he will drive the patient directly to the [facility] office. ED precautions provided. Patient and family member expressed understanding of instructions and appreciation of assistance.

## 2023-08-23 NOTE — Progress Notes (Signed)
 HPI Tracey Mcclure returns today for ongoing evaluation and management of her atrial fib and PPM. She has a h/o symptomatic bradycardia with pauses and underwent PPM insertion and early lead malfunction. She had a new RV lead placed 6 years ago. She has done well in the interim. She denies chest pain or sob. No syncope. Minimal edema.  about 2 months ago she started feeling more fatigued. She has been found to be bradycardic and her pacing lead impedence is over 3000 and has been found to have no capture bipolar and unipolar.  Allergies  Allergen Reactions   Dilaudid  [Hydromorphone  Hcl] Nausea And Vomiting    PT states she has uncontrolled nausea and vomiting    Azithromycin Other (See Comments)    Patient states that she had severe stomach cramps for a solid 4 hours.    Pravastatin     Other reaction(s): joint aches     Current Outpatient Medications  Medication Sig Dispense Refill   acetaminophen  (TYLENOL ) 325 MG tablet Take 650 mg by mouth every 6 (six) hours as needed for mild pain or headache.      atorvastatin  (LIPITOR) 40 MG tablet TAKE 1 TABLET BY MOUTH EVERY DAY 90 tablet 3   Cholecalciferol  (VITAMIN D ) 2000 units CAPS Take 2,000 Units by mouth daily.      COVID-19 mRNA vaccine 2023-2024 (COMIRNATY ) syringe Inject into the muscle. 0.3 mL 0   cyanocobalamin  1000 MCG tablet Take 1,000 mcg by mouth daily.     ELIQUIS  5 MG TABS tablet TAKE 1 TABLET BY MOUTH TWICE A DAY 180 tablet 1   feeding supplement (ENSURE SURGERY) LIQD Take 237 mLs by mouth 2 (two) times daily between meals.     hydrochlorothiazide  (HYDRODIURIL ) 25 MG tablet Take 0.5 tablets (12.5 mg total) by mouth daily. 90 tablet 2   levothyroxine  (SYNTHROID , LEVOTHROID) 125 MCG tablet Take 62.5-125 mcg by mouth See admin instructions. 125mcg daily EXCEPT 62.5mcg on sunday     losartan (COZAAR) 25 MG tablet Take 25 mg by mouth at bedtime.     ondansetron  (ZOFRAN ) 4 MG tablet Take 1 tablet (4 mg total) by mouth every 6  (six) hours as needed for nausea. 20 tablet 0   Probiotic Product (PROBIOTIC DAILY PO) Take 1 tablet by mouth daily.     No current facility-administered medications for this visit.     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   Past Surgical History:  Procedure Laterality Date   ABDOMINAL HYSTERECTOMY     BILIARY STENT PLACEMENT N/A 10/21/2020   Procedure: BILIARY STENT PLACEMENT;  Surgeon: Evangeline Hilts, MD;  Location: [facility] ENDOSCOPY;  Service: Endoscopy;  Laterality: N/A;   CHOLECYSTECTOMY N/A 10/16/2020   Procedure: LAPAROSCOPIC CHOLECYSTECTOMY WITH INTRAOPERATIVE CHOLANGIOGRAM;  Surgeon: Jacolyn Matar, MD;  Location: [facility] ORS;  Service: General;  Laterality: N/A;   EP IMPLANTABLE DEVICE N/A 10/26/2015   Procedure: Pacemaker  Implant;  Surgeon: Tammie Fall, MD;  Location: [facility] INVASIVE CV LAB;  Service: Cardiovascular;  Laterality: N/A;   ERCP N/A 10/21/2020   Procedure: ENDOSCOPIC RETROGRADE CHOLANGIOPANCREATOGRAPHY (ERCP);  Surgeon: Evangeline Hilts, MD;  Location: [facility];  Service: Endoscopy;  Laterality: N/A;   ERCP N/A 12/09/2020   Procedure: ENDOSCOPIC RETROGRADE CHOLANGIOPANCREATOGRAPHY (ERCP) with stent removal DO NOT USE ONE VIEW SCOPE;  Surgeon: Evangeline Hilts, MD;  Location: [facility];  Service: Endoscopy;  Laterality: N/A;   IR CHOLANGIOGRAM EXISTING TUBE  10/12/2020   IR PERC CHOLECYSTOSTOMY  09/07/2020   OVARY SURGERY     BENIGN TUMOR    PACEMAKER REVISION N/A 09/01/2017   Procedure: PACEMAKER REVISION;  Surgeon: Lei Pump, MD;  Location: [facility] INVASIVE CV LAB;  Service: Cardiovascular;  Laterality: N/A;   PANCREATIC STENT PLACEMENT  10/21/2020   Procedure: PANCREATIC STENT PLACEMENT;  Surgeon: Evangeline Hilts, MD;  Location: [facility];  Service: Endoscopy;;   SPHINCTEROTOMY  10/21/2020   Procedure: [address];  Surgeon: Evangeline Hilts, MD;  Location: [facility];  Service: Endoscopy;;   STENT REMOVAL  12/09/2020   Procedure: STENT REMOVAL;  Surgeon: Evangeline Hilts, MD;  Location: [facility];  Service: Endoscopy;;  biliary     Family History  Problem Relation Age of Onset   Heart attack Brother 57   Prostate cancer Brother    Breast cancer Sister 41     Social History   Socioeconomic History   Marital status: Widowed    Spouse name: Not on file   Number of children: 1   Years of education: Not on file   Highest education level: Not on file  Occupational History   Occupation: Public librarian    Comment: retired  Tobacco Use   Smoking status: Never   Smokeless tobacco: Never  Vaping Use   Vaping status: Never Used  Substance and Sexual Activity   Alcohol  use: No   Drug use: No   Sexual activity: Never  Other Topics Concern   Not on file  Social History Narrative   Not on file   Social Drivers of Health   Financial Resource Strain: Not on file  Food Insecurity: Not on file  Transportation Needs: Not on file  Physical Activity: Not on file  Stress: Not on file  Social Connections: Not on file  Intimate Partner Violence: Not on file     LMP  (LMP Unknown)   Physical Exam: P - 40/min, Resp. - 18 Well appearing NAD HEENT: Unremarkable Neck:  No JVD, no thyromegally Lymphatics:  No adenopathy Back:  No CVA tenderness Lungs:  Clear HEART:  Regular brady rhythm, no murmurs, no rubs, no clicks Abd:  soft, positive bowel sounds, no organomegally, no rebound, no guarding Ext:  2 plus  pulses, no edema, no cyanosis, no clubbing Skin:  No rashes no nodules Neuro:  CN II through XII intact, motor grossly intact  EKG - afib with ventricular rates of 40/min  DEVICE  Normal device function.  See PaceArt for details. RV impedence over 3000.  Assess/Plan: Broken PPM leads - she now has 2 broken leads and will need lead extraction and insertion of a new PPM lead with plans for gen change as she is less than 3 years out for ERI. I have reviewed the indications/risks/benefits and she wishes to proceed. Atrial fib - her vr is controlled/slow.   Pete Brand Avyonna Wagoner,MD

## 2023-08-23 NOTE — Patient Instructions (Addendum)
 You will be scheduled for a pacemaker system extraction with placement of new pacemaker and new RV lead.  You will get lab work today:  BMP and CBC      - Preparing For Surgery    Before surgery, you can play an important role. Because skin is not sterile, your skin needs to be as free of germs as possible. You can reduce the number of germs on your skin by washing with CHG (chlorahexidine gluconate) Soap before surgery.  CHG is an antiseptic cleaner which kills germs and bonds with the skin to continue killing germs even after washing.  Please do not use if you have an allergy to CHG or antibacterial soaps.  If your skin becomes reddened/irritated stop using the CHG.   Do not shave (including legs and underarms) for at least 48 hours prior to first CHG shower.  It is OK to shave your face.  Please follow these instructions carefully:  1.  Shower the night before surgery and the morning of surgery with CHG.  2.  If you choose to wash your hair, wash your hair first as usual with your normal shampoo.  3.  After you shampoo, rinse your hair and body thoroughly to remove the shampoo.  4.  Use CHG as you would any other liquid soap.  You can apply CHG directly to the skin and wash gently with a clean washcloth. 5.  Apply the CHG Soap to your body ONLY FROM THE NECK DOWN.  Do not use on open wounds or open sores.  Avoid contact with your eyes, ears, mouth and genitals (private parts).  Wash genitals (private parts) with your normal soap.  6.  Wash thoroughly, paying special attention to the area where your surgery will be performed.  7.  Thoroughly rinse your body with warm water from the neck down.   8.  DO NOT shower/wash with your normal soap after using and rinsing off the CHG soap.  9.  Pat yourself dry with a clean towel.           10.  Wear clean pajamas.           11.  Place clean sheets on your bed the night of your first shower and do not sleep with pets.  Day of  Surgery: Do not apply any deodorants/lotions.  Please wear clean clothes to the hospital/surgery center.

## 2023-08-23 NOTE — H&P (View-Only) (Signed)
 HPI [name] returns today for ongoing evaluation and management of her atrial fib and PPM. She has a h/o symptomatic bradycardia with pauses and underwent PPM insertion and early lead malfunction. She had a new RV lead placed 6 years ago. She has done well in the interim. She denies chest pain or sob. No syncope. Minimal edema.  about 2 months ago she started feeling more fatigued. She has been found to be bradycardic and her pacing lead impedence is over 3000 and has been found to have no capture bipolar and unipolar.  Allergies  Allergen Reactions   Dilaudid  [Hydromorphone  Hcl] Nausea And Vomiting    PT states she has uncontrolled nausea and vomiting    Azithromycin Other (See Comments)    Patient states that she had severe stomach cramps for a solid 4 hours.    Pravastatin     Other reaction(s): joint aches     Current Outpatient Medications  Medication Sig Dispense Refill   acetaminophen  (TYLENOL ) 325 MG tablet Take 650 mg by mouth every 6 (six) hours as needed for mild pain or headache.      atorvastatin  (LIPITOR) 40 MG tablet TAKE 1 TABLET BY MOUTH EVERY DAY 90 tablet 3   Cholecalciferol  (VITAMIN D ) 2000 units CAPS Take 2,000 Units by mouth daily.      COVID-19 mRNA vaccine 2023-2024 (COMIRNATY ) syringe Inject into the muscle. 0.3 mL 0   cyanocobalamin  1000 MCG tablet Take 1,000 mcg by mouth daily.     ELIQUIS  5 MG TABS tablet TAKE 1 TABLET BY MOUTH TWICE A DAY 180 tablet 1   feeding supplement (ENSURE SURGERY) LIQD Take 237 mLs by mouth 2 (two) times daily between meals.     hydrochlorothiazide  (HYDRODIURIL ) 25 MG tablet Take 0.5 tablets (12.5 mg total) by mouth daily. 90 tablet 2   levothyroxine  (SYNTHROID , LEVOTHROID) 125 MCG tablet Take 62.5-125 mcg by mouth See admin instructions. 125mcg daily EXCEPT 62.5mcg on sunday     losartan (COZAAR) 25 MG tablet Take 25 mg by mouth at bedtime.     ondansetron  (ZOFRAN ) 4 MG tablet Take 1 tablet (4 mg total) by mouth every 6  (six) hours as needed for nausea. 20 tablet 0   Probiotic Product (PROBIOTIC DAILY PO) Take 1 tablet by mouth daily.     No current facility-administered medications for this visit.     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   Past Surgical History:  Procedure Laterality Date   ABDOMINAL HYSTERECTOMY     BILIARY STENT PLACEMENT N/A 10/21/2020   Procedure: BILIARY STENT PLACEMENT;  Surgeon: Evangeline Hilts, MD;  Location: [facility];  Service: Endoscopy;  Laterality: N/A;   CHOLECYSTECTOMY N/A 10/16/2020   Procedure: LAPAROSCOPIC CHOLECYSTECTOMY WITH INTRAOPERATIVE CHOLANGIOGRAM;  Surgeon: Jacolyn Matar, MD;  Location: [facility];  Service: General;  Laterality: N/A;   EP IMPLANTABLE DEVICE N/A 10/26/2015   Procedure: Pacemaker  Implant;  Surgeon: Tammie Fall, MD;  Location: [facility] INVASIVE CV LAB;  Service: Cardiovascular;  Laterality: N/A;   ERCP N/A 10/21/2020   Procedure: ENDOSCOPIC RETROGRADE CHOLANGIOPANCREATOGRAPHY (ERCP);  Surgeon: Evangeline Hilts, MD;  Location: [facility];  Service: Endoscopy;  Laterality: N/A;   ERCP N/A 12/09/2020   Procedure: ENDOSCOPIC RETROGRADE CHOLANGIOPANCREATOGRAPHY (ERCP) with stent removal DO NOT USE ONE VIEW SCOPE;  Surgeon: Evangeline Hilts, MD;  Location: [facility];  Service: Endoscopy;  Laterality: N/A;   IR CHOLANGIOGRAM EXISTING TUBE  10/12/2020   IR PERC CHOLECYSTOSTOMY  09/07/2020   OVARY SURGERY     BENIGN TUMOR    PACEMAKER REVISION N/A 09/01/2017   Procedure: PACEMAKER REVISION;  Surgeon: Lei Pump, MD;  Location: [facility] INVASIVE CV LAB;  Service: Cardiovascular;  Laterality: N/A;   PANCREATIC STENT PLACEMENT  10/21/2020   Procedure: PANCREATIC STENT PLACEMENT;  Surgeon: Evangeline Hilts, MD;  Location: [facility];  Service: Endoscopy;;   SPHINCTEROTOMY  10/21/2020   Procedure: [address];  Surgeon: Evangeline Hilts, MD;  Location: [facility];  Service: Endoscopy;;   STENT REMOVAL  12/09/2020   Procedure: STENT REMOVAL;  Surgeon: Evangeline Hilts, MD;  Location: [facility];  Service: Endoscopy;;  biliary     Family History  Problem Relation Age of Onset   Heart attack Brother 57   Prostate cancer Brother    Breast cancer Sister 41     Social History   Socioeconomic History   Marital status: Widowed    Spouse name: Not on file   Number of children: 1   Years of education: Not on file   Highest education level: Not on file  Occupational History   Occupation: Public librarian    Comment: retired  Tobacco Use   Smoking status: Never   Smokeless tobacco: Never  Vaping Use   Vaping status: Never Used  Substance and Sexual Activity   Alcohol  use: No   Drug use: No   Sexual activity: Never  Other Topics Concern   Not on file  Social History Narrative   Not on file   Social Drivers of Health   Financial Resource Strain: Not on file  Food Insecurity: Not on file  Transportation Needs: Not on file  Physical Activity: Not on file  Stress: Not on file  Social Connections: Not on file  Intimate Partner Violence: Not on file     LMP  (LMP Unknown)   Physical Exam: P - 40/min, Resp. - 18 Well appearing NAD HEENT: Unremarkable Neck:  No JVD, no thyromegally Lymphatics:  No adenopathy Back:  No CVA tenderness Lungs:  Clear HEART:  Regular brady rhythm, no murmurs, no rubs, no clicks Abd:  soft, positive bowel sounds, no organomegally, no rebound, no guarding Ext:  2 plus  pulses, no edema, no cyanosis, no clubbing Skin:  No rashes no nodules Neuro:  CN II through XII intact, motor grossly intact  EKG - afib with ventricular rates of 40/min  DEVICE  Normal device function.  See PaceArt for details. RV impedence over 3000.  Assess/Plan: Broken PPM leads - she now has 2 broken leads and will need lead extraction and insertion of a new PPM lead with plans for gen change as she is less than 3 years out for ERI. I have reviewed the indications/risks/benefits and she wishes to proceed. Atrial fib - her vr is controlled/slow.   Tracey Mcclure Avyonna Wagoner,MD

## 2023-08-24 LAB — CBC WITH DIFFERENTIAL/PLATELET
Basophils Absolute: 0.1 10*3/uL (ref 0.0–0.2)
Basos: 1 %
EOS (ABSOLUTE): 0.1 10*3/uL (ref 0.0–0.4)
Eos: 1 %
Hematocrit: 42 % (ref 34.0–46.6)
Hemoglobin: 13.7 g/dL (ref 11.1–15.9)
Immature Grans (Abs): 0 10*3/uL (ref 0.0–0.1)
Immature Granulocytes: 0 %
Lymphocytes Absolute: 2 10*3/uL (ref 0.7–3.1)
Lymphs: 33 %
MCH: 31.9 pg (ref 26.6–33.0)
MCHC: 32.6 g/dL (ref 31.5–35.7)
MCV: 98 fL — ABNORMAL HIGH (ref 79–97)
Monocytes Absolute: 0.4 10*3/uL (ref 0.1–0.9)
Monocytes: 6 %
Neutrophils Absolute: 3.5 10*3/uL (ref 1.4–7.0)
Neutrophils: 59 %
Platelets: 257 10*3/uL (ref 150–450)
RBC: 4.3 x10E6/uL (ref 3.77–5.28)
RDW: 12.6 % (ref 11.7–15.4)
WBC: 6 10*3/uL (ref 3.4–10.8)

## 2023-08-24 LAB — BASIC METABOLIC PANEL
BUN/Creatinine Ratio: 14 (ref 12–28)
BUN: 13 mg/dL (ref 8–27)
CO2: 20 mmol/L (ref 20–29)
Calcium: 9.7 mg/dL (ref 8.7–10.3)
Chloride: 99 mmol/L (ref 96–106)
Creatinine, Ser: 0.94 mg/dL (ref 0.57–1.00)
Glucose: 101 mg/dL — ABNORMAL HIGH (ref 70–99)
Potassium: 3.9 mmol/L (ref 3.5–5.2)
Sodium: 136 mmol/L (ref 134–144)
eGFR: 61 mL/min/{1.73_m2} (ref >59)

## 2023-08-28 ENCOUNTER — Ambulatory Visit: Payer: Medicare Other

## 2023-08-29 ENCOUNTER — Telehealth: Payer: Self-pay | Admitting: Internal Medicine

## 2023-08-29 NOTE — Telephone Encounter (Signed)
Patient states she has been awaiting on a call to schedule a procedure, but she doesn't know what type of procedure.

## 2023-08-29 NOTE — Telephone Encounter (Signed)
Called pt advised per MD OV note pt will have a PPM extraction with placement of new PPM.   Pt reports April was supposed to call her with procedure date.  Advised pt I will send message to April to f/u.

## 2023-08-30 NOTE — Telephone Encounter (Signed)
Calling back for update. Please advise  

## 2023-08-30 NOTE — Telephone Encounter (Signed)
Spoke with patient and she states she no longer wants to have the PP. She states its been going on for a month and she is not afraid. She lives alone but have people that check on her. She states she would like for you to call and discuss the risk and benefits. Do you think she really needs to have the procedure?

## 2023-08-31 NOTE — Telephone Encounter (Signed)
I spoke with pt and she wanted to know what date her procedure was. I told her she needs to be scheduled for a PPM Gen change with lead extraction and that takes time but I will be working on it and be in touch with her soon.

## 2023-09-06 ENCOUNTER — Telehealth: Payer: Self-pay

## 2023-09-06 NOTE — Telephone Encounter (Signed)
Pt's Daughter called to go over PPM Gen Change/Lead Extraction Instruction letter. She reviewed via MyChart. She stated that they had misplaced the surgical scrub. She will come by the office this afternoon to pick up another bottle and a printed instruction letter.   Letter and scrub left at front desk for her.

## 2023-09-14 ENCOUNTER — Ambulatory Visit (HOSPITAL_COMMUNITY)
Admission: RE | Admit: 2023-09-14 | Discharge: 2023-09-15 | Disposition: A | Discharge status: Home / Self Care | Payer: Medicare Other | Attending: Internal Medicine | Admitting: Internal Medicine

## 2023-09-14 ENCOUNTER — Other Ambulatory Visit: Payer: Self-pay

## 2023-09-14 ENCOUNTER — Encounter (HOSPITAL_COMMUNITY): Payer: Self-pay | Admitting: Internal Medicine

## 2023-09-14 ENCOUNTER — Ambulatory Visit (HOSPITAL_COMMUNITY): Payer: Medicare Other | Admitting: Anesthesiology

## 2023-09-14 ENCOUNTER — Ambulatory Visit (HOSPITAL_BASED_OUTPATIENT_CLINIC_OR_DEPARTMENT_OTHER): Payer: Medicare Other | Admitting: Anesthesiology

## 2023-09-14 ENCOUNTER — Ambulatory Visit (HOSPITAL_COMMUNITY)
Admission: RE | Disposition: A | Discharge status: Home / Self Care | Payer: Self-pay | Source: Home / Self Care | Attending: Internal Medicine

## 2023-09-14 ENCOUNTER — Ambulatory Visit (HOSPITAL_COMMUNITY): Payer: Medicare Other

## 2023-09-14 DIAGNOSIS — Z79899 Other long term (current) drug therapy: Secondary | ICD-10-CM | POA: Insufficient documentation

## 2023-09-14 DIAGNOSIS — T82110S Breakdown (mechanical) of cardiac electrode, sequela: Secondary | ICD-10-CM

## 2023-09-14 DIAGNOSIS — I4891 Unspecified atrial fibrillation: Secondary | ICD-10-CM

## 2023-09-14 DIAGNOSIS — E039 Hypothyroidism, unspecified: Secondary | ICD-10-CM | POA: Diagnosis not present

## 2023-09-14 DIAGNOSIS — T82118A Breakdown (mechanical) of other cardiac electronic device, initial encounter: Secondary | ICD-10-CM | POA: Diagnosis not present

## 2023-09-14 DIAGNOSIS — I4821 Permanent atrial fibrillation: Secondary | ICD-10-CM | POA: Insufficient documentation

## 2023-09-14 DIAGNOSIS — T82110A Breakdown (mechanical) of cardiac electrode, initial encounter: Secondary | ICD-10-CM | POA: Diagnosis not present

## 2023-09-14 DIAGNOSIS — I10 Essential (primary) hypertension: Secondary | ICD-10-CM | POA: Insufficient documentation

## 2023-09-14 DIAGNOSIS — Z950 Presence of cardiac pacemaker: Secondary | ICD-10-CM | POA: Diagnosis not present

## 2023-09-14 DIAGNOSIS — I442 Atrioventricular block, complete: Secondary | ICD-10-CM | POA: Diagnosis not present

## 2023-09-14 DIAGNOSIS — Y831 Surgical operation with implant of artificial internal device as the cause of abnormal reaction of the patient, or of later complication, without mention of misadventure at the time of the procedure: Secondary | ICD-10-CM | POA: Insufficient documentation

## 2023-09-14 DIAGNOSIS — Z01818 Encounter for other preprocedural examination: Secondary | ICD-10-CM

## 2023-09-14 DIAGNOSIS — T82110D Breakdown (mechanical) of cardiac electrode, subsequent encounter: Secondary | ICD-10-CM

## 2023-09-14 DIAGNOSIS — Z7901 Long term (current) use of anticoagulants: Secondary | ICD-10-CM | POA: Diagnosis not present

## 2023-09-14 HISTORY — PX: PPM GENERATOR CHANGEOUT: EP1233

## 2023-09-14 HISTORY — PX: TRANSESOPHAGEAL ECHOCARDIOGRAM (CATH LAB): EP1270

## 2023-09-14 HISTORY — PX: LEAD EXTRACTION: EP1211

## 2023-09-14 LAB — SURGICAL PCR SCREEN
MRSA, PCR: NEGATIVE
Staphylococcus aureus: NEGATIVE

## 2023-09-14 LAB — PREPARE RBC (CROSSMATCH)

## 2023-09-14 LAB — ABO/RH: ABO/RH(D): O POS

## 2023-09-14 SURGERY — LEAD EXTRACTION
Anesthesia: General

## 2023-09-14 MED ORDER — AMISULPRIDE (ANTIEMETIC) 5 MG/2ML IV SOLN
INTRAVENOUS | Status: AC
  Filled 2023-09-14: qty 4

## 2023-09-14 MED ORDER — GLYCOPYRROLATE 0.2 MG/ML IJ SOLN
INTRAMUSCULAR | Status: DC | PRN
  Administered 2023-09-14: .1 mg via INTRAVENOUS

## 2023-09-14 MED ORDER — HYDROMORPHONE HCL 1 MG/ML IJ SOLN
0.2500 mg | INTRAMUSCULAR | Status: DC | PRN
Start: 2023-09-14 — End: 2023-09-14
  Administered 2023-09-14 (×2): 0.5 mg via INTRAVENOUS

## 2023-09-14 MED ORDER — VANCOMYCIN HCL 1000 MG IV SOLR
INTRAVENOUS | Status: DC | PRN
  Administered 2023-09-14: 1000 mg via INTRAVENOUS

## 2023-09-14 MED ORDER — FENTANYL CITRATE (PF) 100 MCG/2ML IJ SOLN
INTRAMUSCULAR | Status: AC
  Filled 2023-09-14: qty 2

## 2023-09-14 MED ORDER — SODIUM CHLORIDE 0.9 % IV SOLN
INTRAVENOUS | Status: AC
  Administered 2023-09-14: 80 mg
  Filled 2023-09-14: qty 2

## 2023-09-14 MED ORDER — CEFAZOLIN SODIUM-DEXTROSE 2-4 GM/100ML-% IV SOLN
INTRAVENOUS | Status: AC
  Filled 2023-09-14: qty 100

## 2023-09-14 MED ORDER — ONDANSETRON HCL 4 MG/2ML IJ SOLN
INTRAMUSCULAR | Status: DC | PRN
  Administered 2023-09-14: 4 mg via INTRAVENOUS

## 2023-09-14 MED ORDER — CEFAZOLIN SODIUM-DEXTROSE 2-4 GM/100ML-% IV SOLN: 2.0000 g | INTRAVENOUS | Status: DC

## 2023-09-14 MED ORDER — HYDROMORPHONE HCL 1 MG/ML IJ SOLN
INTRAMUSCULAR | Status: AC
  Filled 2023-09-14: qty 1

## 2023-09-14 MED ORDER — SUCCINYLCHOLINE CHLORIDE 200 MG/10ML IV SOSY
PREFILLED_SYRINGE | INTRAVENOUS | Status: DC | PRN
  Administered 2023-09-14: 100 mg via INTRAVENOUS

## 2023-09-14 MED ORDER — AMISULPRIDE (ANTIEMETIC) 5 MG/2ML IV SOLN
10.0000 mg | Freq: Once | INTRAVENOUS | Status: AC
Start: 2023-09-14 — End: 2023-09-14
  Administered 2023-09-14: 10 mg via INTRAVENOUS

## 2023-09-14 MED ORDER — HEPARIN (PORCINE) IN NACL 1000-0.9 UT/500ML-% IV SOLN
INTRAVENOUS | Status: DC | PRN
  Administered 2023-09-14: 500 mL

## 2023-09-14 MED ORDER — ONDANSETRON HCL 4 MG/2ML IJ SOLN
4.0000 mg | Freq: Four times a day (QID) | INTRAMUSCULAR | Status: DC | PRN
  Administered 2023-09-15: 4 mg via INTRAVENOUS
  Filled 2023-09-14: qty 2

## 2023-09-14 MED ORDER — PROPOFOL 10 MG/ML IV BOLUS
INTRAVENOUS | Status: DC | PRN
  Administered 2023-09-14: 30 mg via INTRAVENOUS
  Administered 2023-09-14: 100 mg via INTRAVENOUS

## 2023-09-14 MED ORDER — ALBUMIN HUMAN 5 % IV SOLN: INTRAVENOUS | Status: DC | PRN

## 2023-09-14 MED ORDER — PHENYLEPHRINE HCL-NACL 20-0.9 MG/250ML-% IV SOLN
INTRAVENOUS | Status: DC | PRN
  Administered 2023-09-14: 20 ug/min via INTRAVENOUS

## 2023-09-14 MED ORDER — CHLORHEXIDINE GLUCONATE 0.12 % MT SOLN
OROMUCOSAL | Status: AC
  Filled 2023-09-14: qty 15

## 2023-09-14 MED ORDER — VITAMIN B-12 1000 MCG PO TABS
1000.0000 ug | ORAL_TABLET | Freq: Every day | ORAL | Status: DC
  Administered 2023-09-15: 1000 ug via ORAL
  Filled 2023-09-14: qty 1

## 2023-09-14 MED ORDER — ACETAMINOPHEN 10 MG/ML IV SOLN
INTRAVENOUS | Status: AC
  Filled 2023-09-14: qty 100

## 2023-09-14 MED ORDER — SODIUM CHLORIDE 0.9 % IV SOLN: 80.0000 mg | INTRAVENOUS | Status: AC

## 2023-09-14 MED ORDER — LIDOCAINE HCL (PF) 1 % IJ SOLN
INTRAMUSCULAR | Status: DC | PRN
  Administered 2023-09-14: 60 mL

## 2023-09-14 MED ORDER — SODIUM CHLORIDE 0.9 % IV SOLN: INTRAVENOUS | Status: DC

## 2023-09-14 MED ORDER — LEVOTHYROXINE SODIUM 25 MCG PO TABS
125.0000 ug | ORAL_TABLET | ORAL | Status: DC
  Administered 2023-09-15: 125 ug via ORAL
  Filled 2023-09-14: qty 1

## 2023-09-14 MED ORDER — LABETALOL HCL 5 MG/ML IV SOLN
INTRAVENOUS | Status: DC | PRN
  Administered 2023-09-14 (×2): 10 mg via INTRAVENOUS

## 2023-09-14 MED ORDER — ROCURONIUM BROMIDE 10 MG/ML (PF) SYRINGE: PREFILLED_SYRINGE | INTRAVENOUS | Status: DC | PRN

## 2023-09-14 MED ORDER — POVIDONE-IODINE 10 % EX SWAB
2.0000 | Freq: Once | CUTANEOUS | Status: AC
  Administered 2023-09-14: 2 via TOPICAL

## 2023-09-14 MED ORDER — CHLORHEXIDINE GLUCONATE 4 % EX SOLN
4.0000 | Freq: Once | CUTANEOUS | Status: DC
  Filled 2023-09-14: qty 60

## 2023-09-14 MED ORDER — DEXAMETHASONE SODIUM PHOSPHATE 10 MG/ML IJ SOLN
INTRAMUSCULAR | Status: DC | PRN
  Administered 2023-09-14: 4 mg via INTRAVENOUS

## 2023-09-14 MED ORDER — VANCOMYCIN HCL IN DEXTROSE 1-5 GM/200ML-% IV SOLN
INTRAVENOUS | Status: AC
  Filled 2023-09-14: qty 200

## 2023-09-14 MED ORDER — SODIUM CHLORIDE 0.9% IV SOLUTION: Freq: Once | INTRAVENOUS | Status: DC

## 2023-09-14 MED ORDER — ATORVASTATIN CALCIUM 40 MG PO TABS
40.0000 mg | ORAL_TABLET | Freq: Every day | ORAL | Status: DC
  Administered 2023-09-15: 40 mg via ORAL
  Filled 2023-09-14: qty 1

## 2023-09-14 MED ORDER — ACETAMINOPHEN 325 MG PO TABS: 325.0000 mg | ORAL_TABLET | ORAL | Status: DC | PRN

## 2023-09-14 MED ORDER — FENTANYL CITRATE (PF) 250 MCG/5ML IJ SOLN
INTRAMUSCULAR | Status: DC | PRN
  Administered 2023-09-14: 100 ug via INTRAVENOUS

## 2023-09-14 MED ORDER — PHENYLEPHRINE 80 MCG/ML (10ML) SYRINGE FOR IV PUSH (FOR BLOOD PRESSURE SUPPORT)
PREFILLED_SYRINGE | INTRAVENOUS | Status: DC | PRN
  Administered 2023-09-14: 400 ug via INTRAVENOUS

## 2023-09-14 MED ORDER — VANCOMYCIN HCL IN DEXTROSE 1-5 GM/200ML-% IV SOLN
1000.0000 mg | Freq: Once | INTRAVENOUS | Status: AC
  Administered 2023-09-15: 1000 mg via INTRAVENOUS
  Filled 2023-09-14: qty 200

## 2023-09-14 MED ORDER — ACETAMINOPHEN 10 MG/ML IV SOLN
1000.0000 mg | Freq: Once | INTRAVENOUS | Status: DC | PRN
  Administered 2023-09-14: 1000 mg via INTRAVENOUS

## 2023-09-14 MED ORDER — ROCURONIUM BROMIDE 10 MG/ML (PF) SYRINGE
PREFILLED_SYRINGE | INTRAVENOUS | Status: DC | PRN
  Administered 2023-09-14: 100 mg via INTRAVENOUS

## 2023-09-14 MED ORDER — LIDOCAINE HCL (PF) 2 % IJ SOLN
INTRAMUSCULAR | Status: DC | PRN
  Administered 2023-09-14: 100 mg via INTRADERMAL

## 2023-09-14 MED ORDER — FENTANYL CITRATE (PF) 100 MCG/2ML IJ SOLN
25.0000 ug | INTRAMUSCULAR | Status: DC | PRN
  Administered 2023-09-14: 25 ug via INTRAVENOUS

## 2023-09-14 MED ORDER — VITAMIN D 25 MCG (1000 UNIT) PO TABS
2000.0000 [IU] | ORAL_TABLET | Freq: Every day | ORAL | Status: DC
  Administered 2023-09-15: 2000 [IU] via ORAL
  Filled 2023-09-14 (×2): qty 2

## 2023-09-14 SURGICAL SUPPLY — 27 items
CABLE SURGICAL S-101-97-12 (CABLE) ×2 IMPLANT
CATH RIGHTSITE C315HIS02 (CATHETERS) ×1 IMPLANT
COIL ONE TIE COMPRESSION (VASCULAR PRODUCTS) ×2 IMPLANT
DEVICE LCKNG LEAD CARDIAC (CATHETERS) IMPLANT
EXTENDER BULLDOG LEAD (MISCELLANEOUS) ×2 IMPLANT
FELT TEFLON 1X6 (MISCELLANEOUS) ×2 IMPLANT
GUIDEWIRE ANGLED .035X150CM (WIRE) ×1 IMPLANT
IPG PACE AZUR XT SR MRI W1SR01 (Pacemaker) IMPLANT
KIT LEAD ACCESSORY 6056 PINCH (KITS) ×1 IMPLANT
KIT WRENCH (KITS) ×1 IMPLANT
LEAD SELECT SECURE 3830 383069 (Lead) IMPLANT
PACE AZURE XT SR MRI W1SR01 (Pacemaker) ×1 IMPLANT
PAD DEFIB RADIO PHYSIO CONN (PAD) ×2 IMPLANT
POUCH AIGIS-R ANTIBACT PPM (Mesh General) ×1 IMPLANT
POUCH AIGIS-R ANTIBACT PPM MED (Mesh General) IMPLANT
REMOVAL LLD CARDIAC LEAD EZ (CATHETERS) ×1 IMPLANT
SELECT SECURE 3830 383069 (Lead) ×1 IMPLANT
SHEATH 11 SUB-C ROTATE DILATOR (SHEATH) ×1 IMPLANT
SHEATH 9FR PRELUDE SNAP 25 (SHEATH) ×1 IMPLANT
SHEATH EVOLUTION RL 11F (SHEATH) ×1 IMPLANT
SHEATH PINNACLE 6F 10CM (SHEATH) ×1 IMPLANT
SHEATH TIGHTRAIL MECH 13F (SHEATH) ×1 IMPLANT
SHEATH TIGHTRAIL SUB-C (SHEATH) ×1 IMPLANT
SLITTER 6232ADJ (MISCELLANEOUS) ×1 IMPLANT
STYLET LIBERATOR LOCKING (MISCELLANEOUS) ×2 IMPLANT
TRAY PACEMAKER INSERTION (PACKS) ×2 IMPLANT
WIRE MICRO SET SILHO 5FR 7 (SHEATH) ×2 IMPLANT

## 2023-09-14 NOTE — Anesthesia Procedure Notes (Signed)
 Arterial Line Insertion Start/End2/01/2024 2:10 PM, 09/14/2023 2:13 PM Performed by: Lansing Hildegard NOVAK, CRNA, CRNA  Patient location: Pre-op. Preanesthetic checklist: patient identified, IV checked, site marked, risks and benefits discussed, surgical consent, monitors and equipment checked, pre-op evaluation, timeout performed and anesthesia consent Lidocaine  1% used for infiltration Right, radial was placed Catheter size: 20 G Hand hygiene performed , maximum sterile barriers used  and Seldinger technique used Allen's test indicative of satisfactory collateral circulation Attempts: 1 Procedure performed without using ultrasound guided technique. Following insertion, dressing applied. Post procedure assessment: normal and unchanged  Patient tolerated the procedure well with no immediate complications.

## 2023-09-14 NOTE — Interval H&P Note (Signed)
 History and Physical Interval Note:  09/14/2023 2:29 PM  Ronal LITTIE Buster  has presented today for surgery, with the diagnosis of lead failure.  The various methods of treatment have been discussed with the patient and family. After consideration of risks, benefits and other options for treatment, the patient has consented to  Procedure(s): LEAD EXTRACTION (N/A) PPM GENERATOR CHANGEOUT (N/A) as a surgical intervention.  The patient's history has been reviewed, patient examined, no change in status, stable for surgery.  I have reviewed the patient's chart and labs.  Questions were answered to the patient's satisfaction.     Danelle Birmingham

## 2023-09-14 NOTE — Anesthesia Preprocedure Evaluation (Addendum)
 Anesthesia Evaluation  Patient identified by MRN, date of birth, ID band Patient awake    Reviewed: Allergy & Precautions, Patient's Chart, lab work & pertinent test results  History of Anesthesia Complications Negative for: history of anesthetic complications  Airway Mallampati: II  TM Distance: >3 FB Neck ROM: Full    Dental no notable dental hx.    Pulmonary    Pulmonary exam normal breath sounds clear to auscultation       Cardiovascular hypertension, Normal cardiovascular exam+ dysrhythmias Atrial Fibrillation + pacemaker  Rhythm:Regular Rate:Normal   1. Left ventricular ejection fraction, by estimation, is 60 to 65%. The  left ventricle has normal function. The left ventricle has no regional  wall motion abnormalities. There is moderate asymmetric left ventricular  hypertrophy of the basal-septal  segment. There was turbulence in the LV outflow tract but no significant  gradient was measured. Left ventricular diastolic parameters are  consistent with Grade II diastolic dysfunction (pseudonormalization).   2. Right ventricular systolic function is normal. The right ventricular  size is normal. There is mildly elevated pulmonary artery systolic  pressure. The estimated right ventricular systolic pressure is 37.1 mmHg.   3. Left atrial size was severely dilated.   4. Right atrial size was mildly dilated.   5. The mitral valve is abnormal with very mild systolic anterior motion.  Mild to moderate mitral valve regurgitation. No evidence of mitral  stenosis. Moderate to severe mitral annular calcification.   6. The tricuspid valve is abnormal. Tricuspid valve regurgitation is  moderate to severe.   7. The aortic valve is tricuspid. There is mild calcification of the  aortic valve. Aortic valve regurgitation is mild. No aortic stenosis is  present.   8. Aortic dilatation noted. There is mild dilatation of the ascending  aorta,  measuring 41 mm.   9. The inferior vena cava is normal in size with greater than 50%  respiratory variability, suggesting right atrial pressure of 3 mmHg.  10. This study is consistent with hypertrophic cardiomyopathy.     Neuro/Psych    GI/Hepatic Neg liver ROS,GERD  Controlled,,  Endo/Other  Hypothyroidism    Renal/GU negative Renal ROS     Musculoskeletal   Abdominal   Peds  Hematology Lab Results      Component                Value               Date                      WBC                      6.0                 08/23/2023                HGB                      13.7                08/23/2023                HCT                      42.0                08/23/2023  MCV                      98 (H)              08/23/2023                PLT                      257                 08/23/2023              Anesthesia Other Findings   Reproductive/Obstetrics                             Anesthesia Physical Anesthesia Plan  ASA: 2  Anesthesia Plan: General   Post-op Pain Management:    Induction: Intravenous  PONV Risk Score and Plan: 3 and Ondansetron  and Dexamethasone   Airway Management Planned: Oral ETT  Additional Equipment: Arterial line  Intra-op Plan:   Post-operative Plan: Extubation in OR  Informed Consent:   Plan Discussed with: CRNA  Anesthesia Plan Comments:         Anesthesia Quick Evaluation

## 2023-09-14 NOTE — Anesthesia Procedure Notes (Signed)
 Procedure Name: Intubation Date/Time: 09/14/2023 3:26 PM  Performed by: Lockie Flesher, CRNAPre-anesthesia Checklist: Patient identified, Emergency Drugs available, Suction available and Patient being monitored Patient Re-evaluated:Patient Re-evaluated prior to induction Oxygen Delivery Method: Circle System Utilized Preoxygenation: Pre-oxygenation with 100% oxygen Induction Type: IV induction Ventilation: Mask ventilation without difficulty Laryngoscope Size: Mac and 3 Grade View: Grade I Tube type: Oral Tube size: 7.0 mm Number of attempts: 1 Airway Equipment and Method: Stylet and Oral airway Placement Confirmation: ETT inserted through vocal cords under direct vision, positive ETCO2 and breath sounds checked- equal and bilateral Secured at: 21 cm Tube secured with: Tape Dental Injury: Teeth and Oropharynx as per pre-operative assessment

## 2023-09-14 NOTE — Progress Notes (Signed)
 Echocardiogram Echocardiogram Transesophageal has been performed.  Tracey Mcclure 09/14/2023, 6:20 PM

## 2023-09-14 NOTE — Transfer of Care (Signed)
 Immediate Anesthesia Transfer of Care Note  Patient: Tracey Mcclure  Procedure(s) Performed: LEAD EXTRACTION PPM GENERATOR CHANGEOUT TRANSESOPHAGEAL ECHOCARDIOGRAM  Patient Location: PACU  Anesthesia Type:General  Level of Consciousness: awake, alert , and oriented  Airway & Oxygen Therapy: Patient Spontanous Breathing and Patient connected to nasal cannula oxygen  Post-op Assessment: Report given to RN and Post -op Vital signs reviewed and stable  Post vital signs: Reviewed and stable  Last Vitals:  Vitals Value Taken Time  BP 157/74 09/14/23 1808  Temp    Pulse 70 09/14/23 1810  Resp 14 09/14/23 1810  SpO2 100 % 09/14/23 1810  Vitals shown include unfiled device data.  Last Pain:  Vitals:   09/14/23 1340  TempSrc: Oral  PainSc: 0-No pain         Complications: No notable events documented.

## 2023-09-15 ENCOUNTER — Ambulatory Visit (HOSPITAL_COMMUNITY): Payer: Medicare Other

## 2023-09-15 ENCOUNTER — Encounter (HOSPITAL_COMMUNITY): Payer: Self-pay | Admitting: Internal Medicine

## 2023-09-15 ENCOUNTER — Other Ambulatory Visit: Payer: Self-pay | Admitting: Cardiology

## 2023-09-15 DIAGNOSIS — I4821 Permanent atrial fibrillation: Secondary | ICD-10-CM | POA: Diagnosis not present

## 2023-09-15 DIAGNOSIS — Z79899 Other long term (current) drug therapy: Secondary | ICD-10-CM | POA: Diagnosis not present

## 2023-09-15 DIAGNOSIS — I442 Atrioventricular block, complete: Secondary | ICD-10-CM | POA: Diagnosis not present

## 2023-09-15 DIAGNOSIS — Z950 Presence of cardiac pacemaker: Secondary | ICD-10-CM | POA: Diagnosis not present

## 2023-09-15 DIAGNOSIS — I10 Essential (primary) hypertension: Secondary | ICD-10-CM | POA: Diagnosis not present

## 2023-09-15 DIAGNOSIS — Z7901 Long term (current) use of anticoagulants: Secondary | ICD-10-CM | POA: Diagnosis not present

## 2023-09-15 DIAGNOSIS — T82110S Breakdown (mechanical) of cardiac electrode, sequela: Secondary | ICD-10-CM | POA: Diagnosis not present

## 2023-09-15 DIAGNOSIS — E039 Hypothyroidism, unspecified: Secondary | ICD-10-CM | POA: Diagnosis not present

## 2023-09-15 MED FILL — Cefazolin Sodium-Dextrose IV Solution 2 GM/100ML-4%: INTRAVENOUS | Qty: 100 | Status: AC

## 2023-09-15 NOTE — Discharge Summary (Signed)
 ELECTROPHYSIOLOGY PROCEDURE DISCHARGE SUMMARY    Patient ID: Tracey Mcclure,  MRN: [id number], DOB/AGE: Sep 10, 1940 83 y.o.  Admit date: 09/14/2023 Discharge date: 09/15/2023  Primary Care Physician: Shayne Anes, MD  Electrophysiologist: Dr. Waddell  Primary Discharge Diagnosis:  PPM lead failures PPM system extraction > new single chamber PPM implanted  Secondary Discharge Diagnosis:  Permanent AFib CHA2DS2Vasc is 4, on eliquis  HOLD # DAYS  HTN  Hypothyroid   Allergies  Allergen Reactions   Dilaudid  [Hydromorphone  Hcl] Nausea And Vomiting    PT states she has uncontrolled nausea and vomiting    Azithromycin Other (See Comments)    Patient states that she had severe stomach cramps for a solid 4 hours.    Penicillin G Sodium     Other Reaction(s): Unknown   Pravastatin     Other reaction(s): joint aches     Procedures This Admission:  09/14/23:  Conclusion: Successful extraction of a 37-year-old and 76-year-old transvenous pacing leads both of which had failed and insertion of a new active-fixation single-chamber pacemaker in a patient with chronic atrial fibrillation and complete heart block with an escape of 40 bpm.    Brief HPI: Tracey Mcclure is a 83 y.o. female is followed out patient for her AFib and PPM management.  Unfortunately developed failur of both of her pacer leads, leading to bradycardia.  Recommended extraction of her PPM system with implant of a new single chamber PPM given developed permanent AFib.   Risks, benefits, and alternatives to PPM implantation were reviewed with the patient who wished to proceed.   Hospital Course:  The patient was admitted and underwent the procedure details as outlined in the procedure report.  She was monitored on telemetry overnight which demonstrated AFib, v paced.  Left chest was without hematoma or ecchymosis.  The device was interrogated and found to be functioning normally.  CXR was obtained and demonstrated no  pneumothorax status post device implantation.  Wound care, arm mobility, and restrictions were reviewed with the patient.  The patient feels well, denies any CP/SOB, with  minimal site discomfort.  She was examined by Dr. Kennyth and considered stable for discharge to home.   HOLD Eliquis  3 days Resume on 09/18/23   Physical Exam: Vitals:   09/14/23 1930 09/14/23 2004 09/15/23 0025 09/15/23 0459  BP: 139/78  111/66 (!) 140/74  Pulse: 69     Resp: 10     Temp: 97.6 F (36.4 C) 97.9 F (36.6 C)  98.5 F (36.9 C)  TempSrc:    Oral  SpO2: 96%   97%  Weight:      Height:        GEN- The patient is well appearing, alert and oriented x 3 today.   HEENT: normocephalic, atraumatic; sclera clear, conjunctiva pink; hearing intact; oropharynx clear; neck supple, no JVP Lungs-  CTA b/l, normal work of breathing.  No wheezes, rales, rhonchi Heart- RRR (paced), no murmurs, rubs or gallops, PMI not laterally displaced GI- soft, non-tender, non-distended Extremities- no clubbing, cyanosis, or edema, R groin is stable, no bleeding or hematoma MS- no significant deformity or atrophy Skin- warm and dry, no rash or lesion, left chest without hematoma/ecchymosis Psych- euthymic mood, full affect Neuro- no gross deficits   Labs:   Lab Results  Component Value Date   WBC 6.0 08/23/2023   HGB 13.7 08/23/2023   HCT 42.0 08/23/2023   MCV 98 (H) 08/23/2023   PLT 257 08/23/2023   No  results for input(s): NA, K, CL, CO2, BUN, CREATININE, CALCIUM , PROT, BILITOT, ALKPHOS, ALT, AST, GLUCOSE in the last 168 hours.  Invalid input(s): LABALBU  Discharge Medications:  Allergies as of 09/15/2023       Reactions   Dilaudid  [hydromorphone  Hcl] Nausea And Vomiting   PT states she has uncontrolled nausea and vomiting    Azithromycin Other (See Comments)   Patient states that she had severe stomach cramps for a solid 4 hours.    Penicillin G Sodium    Other Reaction(s): Unknown    Pravastatin    Other reaction(s): joint aches        Medication List     TAKE these medications    acetaminophen  325 MG tablet Commonly known as: TYLENOL  Take 650 mg by mouth every 6 (six) hours as needed for mild pain or headache.   atorvastatin  40 MG tablet Commonly known as: LIPITOR Take 1 tablet (40 mg total) by mouth daily. Please call [phone] to schedule an appointment with Dr. Peter Jordan for future refills. Thank you. 1st attempt. What changed: additional instructions   Comirnaty  syringe Generic drug: COVID-19 mRNA vaccine (Pfizer) Inject into the muscle.   cyanocobalamin  1000 MCG tablet Take 1,000 mcg by mouth daily.   Eliquis  5 MG Tabs tablet Generic drug: apixaban  TAKE 1 TABLET BY MOUTH TWICE A DAY Notes to patient: Do NOT resume until 09/18/23   feeding supplement Liqd Take 237 mLs by mouth 2 (two) times daily between meals. What changed: when to take this   Florastor 250 MG capsule Generic drug: saccharomyces boulardii Take 250 mg by mouth daily.   hydrochlorothiazide  25 MG tablet Commonly known as: HYDRODIURIL  Take 0.5 tablets (12.5 mg total) by mouth daily.   ibandronate 150 MG tablet Commonly known as: BONIVA Take 150 mg by mouth every 30 (thirty) days.   levothyroxine  125 MCG tablet Commonly known as: SYNTHROID  Take 62.5-125 mcg by mouth See admin instructions. Take 125 mcg daily EXCEPT 62.5mcg or none on sunday   losartan 25 MG tablet Commonly known as: COZAAR Take 25 mg by mouth at bedtime.   Vitamin D  50 MCG (2000 UT) Caps Take 2,000 Units by mouth daily.        Disposition: home Discharge Instructions     Diet - low sodium heart healthy   Complete by: As directed    Increase activity slowly   Complete by: As directed         Duration of Discharge Encounter: 35 minutes, APP time  Bonney Charlies Arthur, PA-C 09/15/2023 11:27 AM

## 2023-09-15 NOTE — Plan of Care (Signed)
  Problem: Education: Goal: Knowledge of cardiac device and self-care will improve Outcome: Progressing Goal: Ability to safely manage health related needs after discharge will improve Outcome: Progressing   Problem: Cardiac: Goal: Ability to achieve and maintain adequate cardiopulmonary perfusion will improve Outcome: Progressing   Problem: Education: Goal: Knowledge of General Education information will improve Description: Including pain rating scale, medication(s)/side effects and non-pharmacologic comfort measures Outcome: Progressing   Problem: Health Behavior/Discharge Planning: Goal: Ability to manage health-related needs will improve Outcome: Progressing   Problem: Clinical Measurements: Goal: Ability to maintain clinical measurements within normal limits will improve Outcome: Progressing Goal: Respiratory complications will improve Outcome: Progressing Goal: Cardiovascular complication will be avoided Outcome: Progressing   Problem: Activity: Goal: Risk for activity intolerance will decrease Outcome: Progressing   Problem: Nutrition: Goal: Adequate nutrition will be maintained Outcome: Progressing   Problem: Coping: Goal: Level of anxiety will decrease Outcome: Progressing   Problem: Elimination: Goal: Will not experience complications related to bowel motility Outcome: Progressing Goal: Will not experience complications related to urinary retention Outcome: Progressing   Problem: Pain Managment: Goal: General experience of comfort will improve and/or be controlled Outcome: Progressing   Problem: Safety: Goal: Ability to remain free from injury will improve Outcome: Progressing   Problem: Skin Integrity: Goal: Risk for impaired skin integrity will decrease Outcome: Progressing

## 2023-09-15 NOTE — Progress Notes (Signed)
 Reviewed AVS, patient expressed understanding of medications, MD follow up reviewed.  Removed IV, Site clean, dry and intact.  Pt transported to entrance A where family member was waiting in vehicle to transport home.

## 2023-09-15 NOTE — Discharge Instructions (Addendum)
 After Your Pacemaker   You have a [employer]tronic Pacemaker  ACTIVITY Do not lift your arm above shoulder height for 1 week after your procedure. After 7 days, you may progress as below.  You should remove your sling 24 hours after your procedure, unless otherwise instructed by your provider.     Friday September 22, 2023  Saturday September 23, 2023 Sunday September 24, 2023 Monday September 25, 2023   Do not lift, push, pull, or carry anything over 10 pounds with the affected arm until 6 weeks (Friday October 27, 2023 ) after your procedure.   You may drive AFTER your wound check, unless you have been told otherwise by your provider.   Ask your healthcare provider when you can go back to work   INCISION/Dressing If you are on a blood thinner such as Coumadin, Xarelto, Eliquis , Plavix, or Pradaxa please confirm with your provider when this should be resumed. Resume Eliquis  on 09/18/23  Monitor your Pacemaker site for redness, swelling, and drainage. Call the device clinic at [phone] if you experience these symptoms or fever/chills.  If your incision is sealed with Steri-strips or staples, you may shower 7 days after your procedure or when told by your provider. Do not remove the steri-strips or let the shower hit directly on your site. You may wash around your site with soap and water.    If you were discharged in a sling, please do not wear this during the day more than 48 hours after your surgery unless otherwise instructed. This may increase the risk of stiffness and soreness in your shoulder.   Avoid lotions, ointments, or perfumes over your incision until it is well-healed.  You may use a hot tub or a pool AFTER your wound check appointment if the incision is completely closed.  Pacemaker Alerts:  Some alerts are vibratory and others beep. These are NOT emergencies. Please call our office to let us  know. If this occurs at night or on weekends, it can wait until the next business day.  Send a remote transmission.  If your device is capable of reading fluid status (for heart failure), you will be offered monthly monitoring to review this with you.   DEVICE MANAGEMENT Remote monitoring is used to monitor your pacemaker from home. This monitoring is scheduled every 91 days by our office. It allows us  to keep an eye on the functioning of your device to ensure it is working properly. You will routinely see your Electrophysiologist annually (more often if necessary).   You should receive your ID card for your new device in 4-8 weeks. Keep this card with you at all times once received. Consider wearing a medical alert bracelet or necklace.  Your Pacemaker may be MRI compatible. This will be discussed at your next office visit/wound check.  You should avoid contact with strong electric or magnetic fields.   Do not use amateur (ham) radio equipment or electric (arc) welding torches. MP3 player headphones with magnets should not be used. Some devices are safe to use if held at least 12 inches (30 cm) from your Pacemaker. These include power tools, lawn mowers, and speakers. If you are unsure if something is safe to use, ask your health care provider.  When using your cell phone, hold it to the ear that is on the opposite side from the Pacemaker. Do not leave your cell phone in a pocket over the Pacemaker.  You may safely use electric blankets, heating pads, computers, and microwave ovens.  Call the office right away if: You have chest pain. You feel more short of breath than you have felt before. You feel more light-headed than you have felt before. Your incision starts to open up.  This information is not intended to replace advice given to you by your health care provider. Make sure you discuss any questions you have with your health care provider.   GROIN CARE  This sheet gives you information about how to care for yourself after your procedure. Your health care provider may  also give you more specific instructions. If you have problems or questions, contact your health care provider. What can I expect after the procedure? After the procedure, it is common to have: Bruising around your puncture site. Tenderness around your puncture site. Skipped heartbeats. If you had an atrial fibrillation ablation, you may have atrial fibrillation during the first several months after your procedure.  Tiredness (fatigue).  Follow these instructions at home: Puncture site care  Follow instructions from your health care provider about how to take care of your puncture site. Make sure you: If present, leave stitches (sutures), skin glue, or adhesive strips in place. These skin closures may need to stay in place for up to 2 weeks. If adhesive strip edges start to loosen and curl up, you may trim the loose edges. Do not remove adhesive strips completely unless your health care provider tells you to do that. If a large square bandage is present, this may be removed 24 hours after surgery.  Check your puncture site every day for signs of infection. Check for: Redness, swelling, or pain. Fluid or blood. If your puncture site starts to bleed, lie down on your back, apply firm pressure to the area, and contact your health care provider. Warmth. Pus or a bad smell. A pea or marble sized lump/knot at the site is normal and can take up to three months to resolve.        Activity Avoid activities that take a lot of effort for at least 7 days after your procedure. Do not lift anything that is heavier than 5 lb (4.5 kg) for one week.  No sexual activity for 1 week.  Return to your normal activities as told by your health care provider. Ask your health care provider what activities are safe for you. Contact a health care provider if: You have redness, mild swelling, or pain around your puncture site. You have fluid or blood coming from your puncture site that stops after applying firm  pressure to the area. Your puncture site feels warm to the touch. You have pus or a bad smell coming from your puncture site. You have a fever. You have chest pain or discomfort that spreads to your neck, jaw, or arm. You have chest pain that is worse with lying on your back or taking a deep breath. You are sweating a lot. You feel nauseous. You have a fast or irregular heartbeat. You have shortness of breath. You are dizzy or light-headed and feel the need to lie down. You have pain or numbness in the arm or leg closest to your puncture site. Get help right away if: Your puncture site suddenly swells. Your puncture site is bleeding and the bleeding does not stop after applying firm pressure to the area. These symptoms may represent a serious problem that is an emergency. Do not wait to see if the symptoms will go away. Get medical help right away. Call your local emergency services (911  in the [place] Do not drive yourself to the hospital. Summary After the procedure, it is normal to have bruising and tenderness at the puncture site in your groin Check your puncture site every day for signs of infection. Get help right away if your puncture site is bleeding and the bleeding does not stop after applying firm pressure to the area. This is a medical emergency. This information is not intended to replace advice given to you by your health care provider. Make sure you discuss any questions you have with your health care provider.

## 2023-09-15 NOTE — Anesthesia Postprocedure Evaluation (Signed)
 Anesthesia Post Note  Patient: Tracey Mcclure  Procedure(s) Performed: LEAD EXTRACTION PPM GENERATOR CHANGEOUT TRANSESOPHAGEAL ECHOCARDIOGRAM     Patient location during evaluation: PACU Anesthesia Type: General Level of consciousness: awake and alert Pain management: pain level controlled Vital Signs Assessment: post-procedure vital signs reviewed and stable Respiratory status: spontaneous breathing, nonlabored ventilation, respiratory function stable and patient connected to nasal cannula oxygen Cardiovascular status: blood pressure returned to baseline and stable Postop Assessment: no apparent nausea or vomiting Anesthetic complications: no   No notable events documented.  Last Vitals:  Vitals:   09/15/23 0025 09/15/23 0459  BP: 111/66 (!) 140/74  Pulse:    Resp:    Temp:  36.9 C  SpO2:  97%    Last Pain:  Vitals:   09/15/23 0751  TempSrc:   PainSc: 0-No pain                 Thom JONELLE Peoples

## 2023-09-16 LAB — BPAM RBC
Blood Product Expiration Date: 202503092359
Blood Product Expiration Date: 202503092359
Blood Product Expiration Date: 202503092359
Blood Product Expiration Date: 202503092359
ISSUE DATE / TIME: 202502062315
ISSUE DATE / TIME: 202502070225
Unit Type and Rh: 5100
Unit Type and Rh: 5100
Unit Type and Rh: 5100
Unit Type and Rh: 5100

## 2023-09-16 LAB — TYPE AND SCREEN
ABO/RH(D): O POS
Antibody Screen: NEGATIVE
Unit division: 0
Unit division: 0
Unit division: 0
Unit division: 0

## 2023-09-25 ENCOUNTER — Encounter (HOSPITAL_COMMUNITY): Payer: Self-pay | Admitting: Internal Medicine

## 2023-09-27 NOTE — Addendum Note (Signed)
 Addendum  created 09/27/23 1455 by [place] Nation, MD   Intraprocedure Staff edited

## 2023-09-28 ENCOUNTER — Ambulatory Visit: Payer: Medicare Other | Attending: Cardiology

## 2023-09-28 DIAGNOSIS — R001 Bradycardia, unspecified: Secondary | ICD-10-CM | POA: Diagnosis not present

## 2023-09-28 LAB — CUP PACEART INCLINIC DEVICE CHECK
Battery Remaining Longevity: 151 mo
Battery Voltage: 3.21 V
Brady Statistic RV Percent Paced: 97.04 %
Date Time Interrogation Session: 20250220121829
Implantable Lead Connection Status: 753985
Implantable Lead Implant Date: 20250206
Implantable Lead Location: 753860
Implantable Lead Model: 3830
Implantable Pulse Generator Implant Date: 20250206
Lead Channel Impedance Value: 418 Ohm
Lead Channel Impedance Value: 589 Ohm
Lead Channel Pacing Threshold Amplitude: 0.625 V
Lead Channel Pacing Threshold Pulse Width: 0.4 ms
Lead Channel Sensing Intrinsic Amplitude: 31.625 mV
Lead Channel Sensing Intrinsic Amplitude: 31.625 mV
Lead Channel Setting Pacing Amplitude: 3.5 V
Lead Channel Setting Pacing Pulse Width: 0.4 ms
Lead Channel Setting Sensing Sensitivity: 1.2 mV
Zone Setting Status: 755011

## 2023-09-28 NOTE — Patient Instructions (Signed)

## 2023-09-28 NOTE — Progress Notes (Signed)
 Normal Pacemaker wound check. Wound well healed.  Thresholds, sensing, and impedances consistent with implant measurements and at 3.5V safety margin/auto capture until 3 month visit. Permanent afib. Reviewed arm restrictions to continue for 6 weeks total post op.  Pt enrolled in remote follow-up.

## 2023-10-08 ENCOUNTER — Other Ambulatory Visit: Payer: Self-pay | Admitting: Cardiology

## 2023-10-19 ENCOUNTER — Other Ambulatory Visit: Payer: Self-pay | Admitting: Cardiology

## 2023-11-17 ENCOUNTER — Other Ambulatory Visit: Payer: Self-pay | Admitting: Internal Medicine

## 2023-11-27 ENCOUNTER — Ambulatory Visit: Payer: Medicare Other

## 2023-12-12 ENCOUNTER — Encounter: Payer: Medicare Other | Admitting: Physician Assistant

## 2023-12-19 ENCOUNTER — Encounter: Payer: Self-pay | Admitting: Student

## 2023-12-19 ENCOUNTER — Ambulatory Visit (INDEPENDENT_AMBULATORY_CARE_PROVIDER_SITE_OTHER): Payer: Medicare Other

## 2023-12-19 ENCOUNTER — Ambulatory Visit: Payer: Medicare Other | Attending: Student | Admitting: Student

## 2023-12-19 VITALS — BP 124/72 | HR 70 | Ht 66.0 in | Wt 165.0 lb

## 2023-12-19 DIAGNOSIS — T82110A Breakdown (mechanical) of cardiac electrode, initial encounter: Secondary | ICD-10-CM | POA: Diagnosis not present

## 2023-12-19 DIAGNOSIS — I482 Chronic atrial fibrillation, unspecified: Secondary | ICD-10-CM | POA: Insufficient documentation

## 2023-12-19 DIAGNOSIS — R001 Bradycardia, unspecified: Secondary | ICD-10-CM | POA: Insufficient documentation

## 2023-12-19 LAB — CUP PACEART INCLINIC DEVICE CHECK
Battery Remaining Longevity: 161 mo
Battery Voltage: 3.2 V
Brady Statistic RV Percent Paced: 97.28 %
Date Time Interrogation Session: 20250513124035
Implantable Lead Connection Status: 753985
Implantable Lead Implant Date: 20250206
Implantable Lead Location: 753860
Implantable Lead Model: 3830
Implantable Pulse Generator Implant Date: 20250206
Lead Channel Impedance Value: 418 Ohm
Lead Channel Impedance Value: 570 Ohm
Lead Channel Pacing Threshold Amplitude: 0.625 V
Lead Channel Pacing Threshold Pulse Width: 0.4 ms
Lead Channel Sensing Intrinsic Amplitude: 31.625 mV
Lead Channel Sensing Intrinsic Amplitude: 31.625 mV
Lead Channel Setting Pacing Amplitude: 2 V
Lead Channel Setting Pacing Pulse Width: 0.4 ms
Lead Channel Setting Sensing Sensitivity: 1.2 mV
Zone Setting Status: 755011

## 2023-12-19 NOTE — Patient Instructions (Signed)
 Medication Instructions:  Your physician recommends that you continue on your current medications as directed. Please refer to the Current Medication list given to you today.  *If you need a refill on your cardiac medications before your next appointment, please call your pharmacy*  Lab Work: None ordered If you have labs (blood work) drawn today and your tests are completely normal, you will receive your results only by: MyChart Message (if you have MyChart) OR A paper copy in the mail If you have any lab test that is abnormal or we need to change your treatment, we will call you to review the results.  Follow-Up: At [facility], you and your health needs are our priority.  As part of our continuing mission to provide you with exceptional heart care, our providers are all part of one team.  This team includes your primary Cardiologist (physician) and Advanced Practice Providers or APPs (Physician Assistants and Nurse Practitioners) who all work together to provide you with the care you need, when you need it.  Your next appointment:   1 year(s)  Provider:   Bambi Lever "Michaelle Adolphus, PA-C

## 2023-12-19 NOTE — Progress Notes (Signed)
  Electrophysiology Office Note:   ID:  [name], [name] 04-Jul-1941, MRN [id number]  Primary Cardiologist: Peter [place], MD Electrophysiologist: Manya Sells, MD      History of Present Illness:   Tracey Mcclure is a 83 y.o. 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  Since last being seen in our clinic the patient reports doing very well. Overall, she denies chest pain, palpitations, dyspnea, PND, orthopnea, nausea, vomiting, dizziness, syncope, edema, weight gain, or early satiety.    Review of systems complete and found to be negative unless listed in HPI.   EP Information / Studies Reviewed:    EKG is not ordered today. EKG from 09/15/2023 reviewed which showed V pacing at 70 bpm   PPM Interrogation-  reviewed in detail today,  See PACEART report.  Arrhythmia/Device History MDT single chamber PPM 10/2015 for CHB/permanent AF  RV lead fracture -> New lead 08/2017  Extraction of both old leads and new MDT Single Chamber PPM 09/14/2023 f   Physical Exam:   VS:  BP 124/72 (BP Location: Left Arm, Patient Position: Sitting, Cuff Size: Normal)   Pulse 70   Ht 5\' 6"  (1.676 m)   Wt 165 lb (74.8 kg)   LMP  (LMP Unknown)   BMI 26.63 kg/m    Wt Readings from Last 3 Encounters:  12/19/23 165 lb (74.8 kg)  09/14/23 163 lb (73.9 kg)  04/17/23 171 lb (77.6 kg)     GEN: No acute distress  NECK: No JVD; No carotid bruits CARDIAC: Regular rate and rhythm, no murmurs, rubs, gallops RESPIRATORY:  Clear to auscultation without rales, wheezing or rhonchi  ABDOMEN: Soft, non-tender, non-distended EXTREMITIES:  No edema; No deformity   ASSESSMENT AND PLAN:    CHB s/p Medtronic PPM  Normal PPM function See Pace Art report No changes today  Her old leads have been extracted and her new system IS MRI safe.   Permanent AF Continue  eliquis  5 mg BID for CHA2DS2/VASc of at least 4  Secondary hypercoagulable state Pt on Eliquis  as above     Disposition:   Follow up with EP APP in 12 months  Signed, Tylene Galla, PA-C

## 2023-12-20 LAB — CUP PACEART REMOTE DEVICE CHECK
Battery Remaining Longevity: 161 mo
Battery Voltage: 3.2 V
Brady Statistic RV Percent Paced: 97.28 %
Date Time Interrogation Session: 20250513021023
Implantable Lead Connection Status: 753985
Implantable Lead Implant Date: 20250206
Implantable Lead Location: 753860
Implantable Lead Model: 3830
Implantable Pulse Generator Implant Date: 20250206
Lead Channel Impedance Value: 418 Ohm
Lead Channel Impedance Value: 570 Ohm
Lead Channel Pacing Threshold Amplitude: 0.625 V
Lead Channel Pacing Threshold Pulse Width: 0.4 ms
Lead Channel Sensing Intrinsic Amplitude: 31.625 mV
Lead Channel Sensing Intrinsic Amplitude: 31.625 mV
Lead Channel Setting Pacing Amplitude: 2 V
Lead Channel Setting Pacing Pulse Width: 0.4 ms
Lead Channel Setting Sensing Sensitivity: 1.2 mV
Zone Setting Status: 755011

## 2023-12-25 ENCOUNTER — Ambulatory Visit: Payer: Self-pay | Admitting: Internal Medicine

## 2024-01-02 NOTE — Addendum Note (Signed)
 Addended by: Henrene Locust A on: 01/02/2024 11:03 AM   Modules accepted: Orders

## 2024-01-02 NOTE — Progress Notes (Signed)
 Remote pacemaker transmission.

## 2024-02-06 DIAGNOSIS — I421 Obstructive hypertrophic cardiomyopathy: Secondary | ICD-10-CM | POA: Diagnosis not present

## 2024-02-06 DIAGNOSIS — D6869 Other thrombophilia: Secondary | ICD-10-CM | POA: Diagnosis not present

## 2024-02-06 DIAGNOSIS — N1831 Chronic kidney disease, stage 3a: Secondary | ICD-10-CM | POA: Diagnosis not present

## 2024-02-06 DIAGNOSIS — D472 Monoclonal gammopathy: Secondary | ICD-10-CM | POA: Diagnosis not present

## 2024-02-06 DIAGNOSIS — R7301 Impaired fasting glucose: Secondary | ICD-10-CM | POA: Diagnosis not present

## 2024-02-06 DIAGNOSIS — I1310 Hypertensive heart and chronic kidney disease without heart failure, with stage 1 through stage 4 chronic kidney disease, or unspecified chronic kidney disease: Secondary | ICD-10-CM | POA: Diagnosis not present

## 2024-02-06 DIAGNOSIS — I340 Nonrheumatic mitral (valve) insufficiency: Secondary | ICD-10-CM | POA: Diagnosis not present

## 2024-02-06 DIAGNOSIS — Z950 Presence of cardiac pacemaker: Secondary | ICD-10-CM | POA: Diagnosis not present

## 2024-02-06 DIAGNOSIS — I4891 Unspecified atrial fibrillation: Secondary | ICD-10-CM | POA: Diagnosis not present

## 2024-02-06 DIAGNOSIS — E039 Hypothyroidism, unspecified: Secondary | ICD-10-CM | POA: Diagnosis not present

## 2024-02-06 DIAGNOSIS — G3184 Mild cognitive impairment, so stated: Secondary | ICD-10-CM | POA: Diagnosis not present

## 2024-02-22 DIAGNOSIS — L814 Other melanin hyperpigmentation: Secondary | ICD-10-CM | POA: Diagnosis not present

## 2024-02-22 DIAGNOSIS — L570 Actinic keratosis: Secondary | ICD-10-CM | POA: Diagnosis not present

## 2024-02-22 DIAGNOSIS — Z85828 Personal history of other malignant neoplasm of skin: Secondary | ICD-10-CM | POA: Diagnosis not present

## 2024-02-22 DIAGNOSIS — L821 Other seborrheic keratosis: Secondary | ICD-10-CM | POA: Diagnosis not present

## 2024-02-26 ENCOUNTER — Ambulatory Visit: Payer: Medicare Other

## 2024-03-18 DIAGNOSIS — M8589 Other specified disorders of bone density and structure, multiple sites: Secondary | ICD-10-CM | POA: Diagnosis not present

## 2024-03-19 ENCOUNTER — Ambulatory Visit: Payer: Medicare Other

## 2024-03-19 DIAGNOSIS — R001 Bradycardia, unspecified: Secondary | ICD-10-CM | POA: Diagnosis not present

## 2024-03-20 LAB — CUP PACEART REMOTE DEVICE CHECK
Battery Remaining Longevity: 158 mo
Battery Voltage: 3.17 V
Brady Statistic RV Percent Paced: 98.11 %
Date Time Interrogation Session: 20250811235524
Implantable Lead Connection Status: 753985
Implantable Lead Implant Date: 20250206
Implantable Lead Location: 753860
Implantable Lead Model: 3830
Implantable Pulse Generator Implant Date: 20250206
Lead Channel Impedance Value: 418 Ohm
Lead Channel Impedance Value: 551 Ohm
Lead Channel Pacing Threshold Amplitude: 0.5 V
Lead Channel Pacing Threshold Pulse Width: 0.4 ms
Lead Channel Sensing Intrinsic Amplitude: 31.625 mV
Lead Channel Sensing Intrinsic Amplitude: 31.625 mV
Lead Channel Setting Pacing Amplitude: 2 V
Lead Channel Setting Pacing Pulse Width: 0.4 ms
Lead Channel Setting Sensing Sensitivity: 1.2 mV
Zone Setting Status: 755011

## 2024-03-21 ENCOUNTER — Ambulatory Visit: Payer: Self-pay | Admitting: Internal Medicine

## 2024-04-18 ENCOUNTER — Encounter: Payer: Self-pay | Admitting: Cardiology

## 2024-05-02 NOTE — Progress Notes (Signed)
 Remote PPM Transmission

## 2024-05-15 ENCOUNTER — Other Ambulatory Visit (HOSPITAL_BASED_OUTPATIENT_CLINIC_OR_DEPARTMENT_OTHER): Payer: Self-pay

## 2024-05-15 DIAGNOSIS — Z23 Encounter for immunization: Secondary | ICD-10-CM | POA: Diagnosis not present

## 2024-05-15 MED ORDER — COMIRNATY 30 MCG/0.3ML IM SUSY
0.3000 mL | PREFILLED_SYRINGE | Freq: Once | INTRAMUSCULAR | 0 refills | Status: AC
  Filled 2024-05-15: qty 0.3, 1d supply, fill #0

## 2024-05-18 ENCOUNTER — Other Ambulatory Visit: Payer: Self-pay | Admitting: Cardiology

## 2024-05-27 ENCOUNTER — Ambulatory Visit: Payer: Medicare Other

## 2024-06-18 ENCOUNTER — Ambulatory Visit: Payer: Medicare Other

## 2024-06-18 DIAGNOSIS — I482 Chronic atrial fibrillation, unspecified: Secondary | ICD-10-CM | POA: Diagnosis not present

## 2024-06-18 LAB — CUP PACEART REMOTE DEVICE CHECK
Battery Remaining Longevity: 155 mo
Battery Voltage: 3.13 V
Brady Statistic RV Percent Paced: 97.76 %
Date Time Interrogation Session: 20251111060437
Implantable Lead Connection Status: 753985
Implantable Lead Implant Date: 20250206
Implantable Lead Location: 753860
Implantable Lead Model: 3830
Implantable Pulse Generator Implant Date: 20250206
Lead Channel Impedance Value: 399 Ohm
Lead Channel Impedance Value: 551 Ohm
Lead Channel Pacing Threshold Amplitude: 0.625 V
Lead Channel Pacing Threshold Pulse Width: 0.4 ms
Lead Channel Sensing Intrinsic Amplitude: 31.625 mV
Lead Channel Sensing Intrinsic Amplitude: 31.625 mV
Lead Channel Setting Pacing Amplitude: 2 V
Lead Channel Setting Pacing Pulse Width: 0.4 ms
Lead Channel Setting Sensing Sensitivity: 1.2 mV
Zone Setting Status: 755011

## 2024-06-21 NOTE — Progress Notes (Signed)
 Remote PPM Transmission

## 2024-06-23 ENCOUNTER — Ambulatory Visit: Payer: Self-pay | Admitting: Internal Medicine

## 2024-08-12 ENCOUNTER — Other Ambulatory Visit (HOSPITAL_BASED_OUTPATIENT_CLINIC_OR_DEPARTMENT_OTHER): Payer: Self-pay

## 2024-08-12 MED ORDER — COMIRNATY 30 MCG/0.3ML IM SUSY
0.3000 mL | PREFILLED_SYRINGE | Freq: Once | INTRAMUSCULAR | 0 refills | Status: AC
  Filled 2024-08-12: qty 0.3, 1d supply, fill #0

## 2024-08-12 MED ORDER — FLUZONE HIGH-DOSE 0.5 ML IM SUSY
0.5000 mL | PREFILLED_SYRINGE | Freq: Once | INTRAMUSCULAR | 0 refills | Status: AC
  Filled 2024-08-12: qty 0.5, 1d supply, fill #0

## 2024-08-26 ENCOUNTER — Ambulatory Visit: Payer: Medicare Other

## 2024-11-25 ENCOUNTER — Ambulatory Visit: Payer: Medicare Other

## 2025-02-24 ENCOUNTER — Ambulatory Visit: Payer: Medicare Other
